# Patient Record
Sex: Female | Born: 1935
Health system: Southern US, Community
[De-identification: ages and names within clinical notes are randomized; demographics above are authoritative.]

## PROBLEM LIST (undated history)

## (undated) DIAGNOSIS — K5790 Diverticulosis of intestine, part unspecified, without perforation or abscess without bleeding: Secondary | ICD-10-CM

## (undated) DIAGNOSIS — E785 Hyperlipidemia, unspecified: Secondary | ICD-10-CM

## (undated) DIAGNOSIS — I498 Other specified cardiac arrhythmias: Secondary | ICD-10-CM

## (undated) DIAGNOSIS — I10 Essential (primary) hypertension: Secondary | ICD-10-CM

## (undated) DIAGNOSIS — M109 Gout, unspecified: Secondary | ICD-10-CM

## (undated) DIAGNOSIS — M25579 Pain in unspecified ankle and joints of unspecified foot: Secondary | ICD-10-CM

## (undated) DIAGNOSIS — K219 Gastro-esophageal reflux disease without esophagitis: Secondary | ICD-10-CM

## (undated) DIAGNOSIS — E039 Hypothyroidism, unspecified: Secondary | ICD-10-CM

## (undated) DIAGNOSIS — K5792 Diverticulitis of intestine, part unspecified, without perforation or abscess without bleeding: Secondary | ICD-10-CM

## (undated) DIAGNOSIS — W19XXXA Unspecified fall, initial encounter: Secondary | ICD-10-CM

## (undated) DIAGNOSIS — M2042 Other hammer toe(s) (acquired), left foot: Secondary | ICD-10-CM

## (undated) DIAGNOSIS — I161 Hypertensive emergency: Secondary | ICD-10-CM

## (undated) DIAGNOSIS — K52831 Collagenous colitis: Secondary | ICD-10-CM

## (undated) DIAGNOSIS — Z9889 Other specified postprocedural states: Secondary | ICD-10-CM

## (undated) DIAGNOSIS — R9431 Abnormal electrocardiogram [ECG] [EKG]: Secondary | ICD-10-CM

## (undated) DIAGNOSIS — E079 Disorder of thyroid, unspecified: Secondary | ICD-10-CM

## (undated) DIAGNOSIS — I499 Cardiac arrhythmia, unspecified: Secondary | ICD-10-CM

## (undated) HISTORY — PX: ABDOMINAL HYSTERECTOMY: SHX81

## (undated) HISTORY — PX: TONSILLECTOMY: SUR1361

## (undated) HISTORY — DX: Essential (primary) hypertension: I10

## (undated) HISTORY — DX: Hyperlipidemia, unspecified: E78.5

## (undated) HISTORY — DX: Gastro-esophageal reflux disease without esophagitis: K21.9

## (undated) HISTORY — DX: Collagenous colitis: K52.831

## (undated) HISTORY — DX: Cardiac arrhythmia, unspecified: I49.9

## (undated) HISTORY — PX: FEMUR SURGERY: SHX943

## (undated) HISTORY — DX: Diverticulitis of intestine, part unspecified, without perforation or abscess without bleeding: K57.92

## (undated) HISTORY — DX: Other specified cardiac arrhythmias: I49.8

## (undated) HISTORY — PX: JOINT REPLACEMENT: SHX530

## (undated) HISTORY — DX: Hypothyroidism, unspecified: E03.9

## (undated) HISTORY — DX: Abnormal electrocardiogram (ECG) (EKG): R94.31

## (undated) HISTORY — PX: CHOLECYSTECTOMY: SHX55

## (undated) HISTORY — DX: Disorder of thyroid, unspecified: E07.9

## (undated) HISTORY — DX: Unspecified fall, initial encounter: W19.XXXA

## (undated) HISTORY — DX: Other hammer toe(s) (acquired), left foot: M20.42

## (undated) HISTORY — PX: ADENOIDECTOMY: SUR15

## (undated) HISTORY — DX: Gout, unspecified: M10.9

## (undated) HISTORY — DX: Pain in unspecified ankle and joints of unspecified foot: M25.579

## (undated) HISTORY — DX: Diverticulosis of intestine, part unspecified, without perforation or abscess without bleeding: K57.90

## (undated) HISTORY — DX: Other specified postprocedural states: Z98.890

---

## 1997-07-15 ENCOUNTER — Inpatient Hospital Stay (HOSPITAL_COMMUNITY): Admission: EM | Admit: 1997-07-15 | Discharge: 1997-07-17 | Payer: Self-pay

## 1997-08-02 ENCOUNTER — Ambulatory Visit (HOSPITAL_COMMUNITY): Admission: RE | Admit: 1997-08-02 | Discharge: 1997-08-02 | Payer: Self-pay

## 1998-12-19 ENCOUNTER — Encounter: Admission: RE | Admit: 1998-12-19 | Discharge: 1998-12-19 | Payer: Self-pay | Admitting: Obstetrics and Gynecology

## 1998-12-19 ENCOUNTER — Encounter: Payer: Self-pay | Admitting: Obstetrics and Gynecology

## 1999-07-08 ENCOUNTER — Ambulatory Visit (HOSPITAL_BASED_OUTPATIENT_CLINIC_OR_DEPARTMENT_OTHER): Admission: RE | Admit: 1999-07-08 | Discharge: 1999-07-08 | Payer: Self-pay | Admitting: Otolaryngology

## 1999-11-18 ENCOUNTER — Encounter: Payer: Self-pay | Admitting: Otolaryngology

## 1999-11-20 ENCOUNTER — Encounter (INDEPENDENT_AMBULATORY_CARE_PROVIDER_SITE_OTHER): Payer: Self-pay | Admitting: Specialist

## 1999-11-20 ENCOUNTER — Ambulatory Visit (HOSPITAL_COMMUNITY): Admission: RE | Admit: 1999-11-20 | Discharge: 1999-11-21 | Payer: Self-pay | Admitting: Otolaryngology

## 1999-12-22 ENCOUNTER — Encounter: Payer: Self-pay | Admitting: Obstetrics and Gynecology

## 1999-12-22 ENCOUNTER — Encounter: Admission: RE | Admit: 1999-12-22 | Discharge: 1999-12-22 | Payer: Self-pay | Admitting: Obstetrics and Gynecology

## 2000-01-31 ENCOUNTER — Ambulatory Visit (HOSPITAL_BASED_OUTPATIENT_CLINIC_OR_DEPARTMENT_OTHER): Admission: RE | Admit: 2000-01-31 | Discharge: 2000-01-31 | Payer: Self-pay | Admitting: Otolaryngology

## 2000-12-23 ENCOUNTER — Encounter: Payer: Self-pay | Admitting: Obstetrics and Gynecology

## 2000-12-23 ENCOUNTER — Encounter: Admission: RE | Admit: 2000-12-23 | Discharge: 2000-12-23 | Payer: Self-pay

## 2002-01-03 ENCOUNTER — Encounter: Payer: Self-pay | Admitting: Obstetrics and Gynecology

## 2002-01-03 ENCOUNTER — Encounter: Admission: RE | Admit: 2002-01-03 | Discharge: 2002-01-03 | Payer: Self-pay | Admitting: Obstetrics and Gynecology

## 2002-12-15 ENCOUNTER — Encounter: Admission: RE | Admit: 2002-12-15 | Discharge: 2002-12-15 | Payer: Self-pay | Admitting: Obstetrics and Gynecology

## 2004-01-02 ENCOUNTER — Encounter: Admission: RE | Admit: 2004-01-02 | Discharge: 2004-01-02 | Payer: Self-pay | Admitting: Obstetrics and Gynecology

## 2005-01-14 ENCOUNTER — Encounter: Admission: RE | Admit: 2005-01-14 | Discharge: 2005-01-14 | Payer: Self-pay | Admitting: Obstetrics and Gynecology

## 2006-01-22 ENCOUNTER — Encounter: Admission: RE | Admit: 2006-01-22 | Discharge: 2006-01-22 | Payer: Self-pay | Admitting: Obstetrics and Gynecology

## 2006-06-01 ENCOUNTER — Encounter: Admission: RE | Admit: 2006-06-01 | Discharge: 2006-06-01 | Payer: Self-pay | Admitting: Family Medicine

## 2007-01-25 ENCOUNTER — Encounter: Admission: RE | Admit: 2007-01-25 | Discharge: 2007-01-25 | Payer: Self-pay | Admitting: Obstetrics and Gynecology

## 2008-03-05 ENCOUNTER — Ambulatory Visit: Payer: Self-pay | Admitting: Gastroenterology

## 2008-03-19 ENCOUNTER — Ambulatory Visit: Payer: Self-pay | Admitting: Gastroenterology

## 2008-05-29 ENCOUNTER — Encounter: Admission: RE | Admit: 2008-05-29 | Discharge: 2008-05-29 | Payer: Self-pay | Admitting: Family Medicine

## 2010-05-19 ENCOUNTER — Other Ambulatory Visit: Payer: Self-pay | Admitting: Family Medicine

## 2010-05-19 DIAGNOSIS — M25562 Pain in left knee: Secondary | ICD-10-CM

## 2010-05-23 NOTE — Discharge Summary (Signed)
[place]  Patient:    Sharon Bailey, Sharon Bailey                       MRN: [id number] Adm. Date:  [id number] Disc. Date: [id number] Attending:  Barbee Cough                           Discharge Summary  PREOPERATIVE DIAGNOSES: 1. Obstructive sleep apnea. 2. Nasal septal deviation with obstruction. 3. Uvulopalatal and tonsillar hypertrophy.  POSTOPERATIVE DIAGNOSES: 1. Obstructive sleep apnea. 2. Nasal septal deviation with obstruction. 3. Uvulopalatal and tonsillar hypertrophy.  SURGICAL PROCEDURES: 1. Uvulopalatopharyngoplasty. 2. Nasal septoplasty. 3. Tonsillectomy. 4. Inferior turbinate intramural cautery performed on November 20, 1999.  CONDITION ON DISCHARGE:  The patient was discharged to home in stable condition in the company of her husband.  DISCHARGE INSTRUCTIONS:  Limited activity, no lifting or straining and no nose blowing. Diet is clear liquids and soft diet as tolerated.  WOUND CARE:  Saline nasal spray four sprays per nostril ten times per day.  DISCHARGE MEDICATIONS: 1. Lortab elixir two to three teaspoons q.4-6h. as needed for pain, dispense    300 cc without refills. 2. Augmentin 10 elixir 400 mg/5 cc two teaspoons b.i.d. for ten days. 3. Vioxx 50 mg p.o. q.d.  BRIEF HISTORY:  Ms. Conchas is a 77 year old white female who is referred for surgical evaluation and management of obstructive sleep apnea. The patient had undergone previous inpatient sleep study, which showed moderate sleep apnea with oxygen desaturation. Given her history and inability to tolerate the use of nasal CPAP she was scheduled for the above surgical procedures prior to admission to the hospital. The risks, benefits and possible complications of each of the procedures was discussed in detail. The patient understood and concurred with our plan for surgery.  HOSPITAL COURSE:  The patient was admitted on November 20, 1999 to the ENT service at [facility]. She was taken to the main operating room and underwent uvulopalatopharyngoplasty, tonsillectomy, nasal septoplasty, and inferior turbinate reduction in order to improve the patients airway patency. The patients surgical procedure was uneventful and she was transferred from the operating room to recovery, and from recovery to unit 2100 in stable condition. Postoperative stay on unit 2100 was uneventful. The patients room air saturation was greater than 91% throughout the night, which was a significant improvement from her preoperative O2 nadir. She was tolerating a soft oral diet and liquids, and pain control was managed with Lortab elixir. The patient was ambulatory and had normal bowel and bladder function. Examination on the first postoperative morning showed the nasal splints to be intact with minimal bloody mucoid discharge. The oral cavity and oropharynx showed uvulopalatal sutures to be intact without evidence of bleeding or infection. The patient was discharged home in stable condition with the above instructions, in the company of her husband. She understands and agrees with our discharge planning, as outlined above. DD:  11/21/99 TD:  11/21/99 Job: [id number]

## 2010-05-23 NOTE — Op Note (Signed)
[place]  Patient:    Sharon Bailey, Sharon Bailey                       MRN: [id number] Proc. Date: 11/20/99 Adm. Date:  [id number] Attending:  Barbee Cough                           Operative Report  PREOPERATIVE DIAGNOSES: 1. Moderately-severe obstructive sleep apnea. 2. Nasal obstruction. 3. Uvulopalatal hypertrophy. 4. Tonsillar hypertrophy.  POSTOPERATIVE DIAGNOSES: 1. Moderately-severe obstructive sleep apnea. 2. Nasal obstruction. 3. Uvulopalatal hypertrophy. 4. Tonsillar hypertrophy.  SURGICAL PROCEDURE: 1. Uvulopalatal pharyngoplasty. 2. Nasal septoplasty. 3. Tonsillectomy. 4. Inferior turbinate intramural cautery.  SURGEON:  Kinnie Scales. Annalee Genta, M.D.  ANESTHESIA:  General endotracheal.  COMPLICATIONS:  None.  ESTIMATED BLOOD LOSS:  Less than 50 cc.  The patient was transferred from the operating room to the recovery room in stable condition.  INDICATIONS:  Ms. Beauchesne is a 77 year old white female referred for an evaluation and surgical management of obstructive sleep apnea.  The patient was found to have moderately-severe sleep apnea with O2 desaturations into the mid-80s.  She was tried on nasal CPAP, but secondary to chronic nasal obstruction, was unable to tolerate the CPAP on an ongoing basis.  Given these findings and her moderate sleep apnea, I recommended that we consider surgical intervention, based on her examination.  Surgery includes nasoseptoplasty, inferior turbinate reduction, uvulopalatal pharyngoplasty, and tonsillectomy. The risks, benefits, and possible complications of each of the surgical procedures were discussed in detail with the patient and with her husband prior to surgery.  They understood and concurred with our plan for surgery. She was cleared for the surgical procedure by her medical physician, Dr. Melvyn Neth D. Clovis Riley.  DESCRIPTION OF PROCEDURE:  The patient was brought to the operating room  on November 20, 1999, and placed in the supine position on the operating room table.  General endotracheal anesthesia was established without difficulty. When the patient was adequately anesthetized, she was injected with 12 cc of 1% lidocaine and 1:100,000 solution epinephrine injected in a submucosal fashion on the nasal septum, and inferior turbinates.  The patients nose was then packed with Afrin-soaked cottonoid pledgets which were left in place for approximately 10 minutes.  She was prepped and draped in a sterile fashion. The procedure was begun with a nasoseptoplasty.  The cottonoids were removed from the nasal cavity, and a right hemitransfixion incision was created in the mucosa.  This was carried from anterior to posterior in a subperichondrial fashion along the right aspect of the nasal septum.  The patient was found to have severe inferior septal spurring and obstruction.  Abutting cartilaginous _____ was crossed in the midline.  The mucoperiosteal flap was elevated on the patients left-hand side.  Deviated bone and cartilage, including the inferior septal spur were resected from the mid and posterior aspects of the nasal septum.  The anterior cartilaginous septum was left intact, as this was not significantly deviated.  The mucoperichondrial flaps were then reapproximated with a #4-0 gut suture on a Keith needle in a horizontal mattressing fashion.  The hemitransfixion incision was closed with a stitch. Bilateral Doyle nasal septal splints were then placed, after the application of Bactroban ointment.  They were sutured into position with a #3-0 Ethilon suture.  The inferior turbinates were then cauterized with the cautery set at 12 watts.  Two passes were made in each inferior  turbinate.  When the turbinates had been adequately cauterized in a submucosal fashion, they were outfractured to create a more patent nasal cavity.  Attention was then turned to the patients oral  cavity and oropharynx.  A Crowe-Davis mouth gag was inserted without difficulty.  There were no loose or broken teeth.  With the Crowe-Davis mouth gag in place, a tonsillectomy was performed beginning on the left-hand side using the Harmonic scalpel, dissecting in the subcapsular fashion, removing the entire left tonsil, from superior pole to tongue base.  The right tonsil was removed in a similar fashion.  A small amount of bleeding was encountered, and this was cauterized using suction cautery.  The tonsillar fossa was then gently abraded with a dry tonsillar sponge.  The mouth gag was released and reapplied, and there was no active bleeding.  Attention was then turned to the uvulopalatal pharyngoplasty.  The patient had excessive palatal tissue, including uvula and posterior tonsillar pillars. The posterior tonsillar pillars were left intact.  Approximately 1.0 cm of palatal tissue along the palatal margin was resected from the superior aspect of the anterior tonsillar pillars, dissecting inferior posterior, leaving a posterior mucosal flap.  The entire uvula and palatal margin was resected, including palatal musculature and anterior mucosa.  With resection of excessive tissue, suspensory sutures were then placed.  These consisted of #3-0 Vicryl suture on a Keith needle in a horizontal mattressing fashion, to advance the posterior tonsillar pillars forward.  The mucosal margin of the palate was then closed with interrupted #3-0 Vicryl sutures.  The patients nasal cavity, nasopharynx, oral cavity, and oropharynx were thoroughly irrigated and suctioned, and an oral gastric tube was passed into the stomach, and the contents were aspirated.  The Crowe-Davis mouth gag was released and removed.  Again no loose or broken teeth were noted.  The patient was then awakened from anesthetic, extubated, and transferred from the operating room to the recovery room in stable condition.   No complications.  The estimated blood loss was less than 50 cc.DD:  11/20/99 TD:  11/20/99 Job: [id number]

## 2010-05-26 ENCOUNTER — Ambulatory Visit
Admission: RE | Admit: 2010-05-26 | Discharge: 2010-05-26 | Disposition: A | Discharge status: Home / Self Care | Payer: Medicare Other | Source: Ambulatory Visit | Attending: Family Medicine | Admitting: Family Medicine

## 2010-05-26 DIAGNOSIS — M25562 Pain in left knee: Secondary | ICD-10-CM

## 2010-06-27 ENCOUNTER — Other Ambulatory Visit (HOSPITAL_COMMUNITY): Payer: Self-pay | Admitting: Orthopedic Surgery

## 2010-06-27 ENCOUNTER — Ambulatory Visit (HOSPITAL_COMMUNITY)
Admission: RE | Admit: 2010-06-27 | Discharge: 2010-06-27 | Disposition: A | Discharge status: Home / Self Care | Payer: Medicare Other | Source: Ambulatory Visit | Attending: Orthopedic Surgery | Admitting: Orthopedic Surgery

## 2010-06-27 ENCOUNTER — Encounter (HOSPITAL_COMMUNITY)
Admission: RE | Admit: 2010-06-27 | Discharge: 2010-06-27 | Disposition: A | Discharge status: Home / Self Care | Payer: Medicare Other | Source: Ambulatory Visit | Attending: Orthopedic Surgery | Admitting: Orthopedic Surgery

## 2010-06-27 DIAGNOSIS — M25569 Pain in unspecified knee: Secondary | ICD-10-CM

## 2010-06-27 DIAGNOSIS — Z01818 Encounter for other preprocedural examination: Secondary | ICD-10-CM | POA: Insufficient documentation

## 2010-06-27 DIAGNOSIS — Z01812 Encounter for preprocedural laboratory examination: Secondary | ICD-10-CM | POA: Insufficient documentation

## 2010-06-27 DIAGNOSIS — Z01810 Encounter for preprocedural cardiovascular examination: Secondary | ICD-10-CM | POA: Insufficient documentation

## 2010-06-27 LAB — CBC
HCT: 39.1 % (ref 36.0–46.0)
Hemoglobin: 13.3 g/dL (ref 12.0–15.0)
MCV: 79 fL (ref 78.0–100.0)
RBC: 4.95 MIL/uL (ref 3.87–5.11)
RDW: 14.4 % (ref 11.5–15.5)

## 2010-06-27 LAB — DIFFERENTIAL
Basophils Absolute: 0.1 10*3/uL (ref 0.0–0.1)
Basophils Relative: 1 % (ref 0–1)
Eosinophils Absolute: 0.7 10*3/uL (ref 0.0–0.7)
Eosinophils Relative: 6 % — ABNORMAL HIGH (ref 0–5)
Neutrophils Relative %: 60 % (ref 43–77)

## 2010-06-27 LAB — BASIC METABOLIC PANEL
Calcium: 10.2 mg/dL (ref 8.4–10.5)
GFR calc Af Amer: 56 mL/min — ABNORMAL LOW (ref >60)
Glucose, Bld: 88 mg/dL (ref 70–99)
Sodium: 138 mEq/L (ref 135–145)

## 2010-06-27 LAB — URINALYSIS, ROUTINE W REFLEX MICROSCOPIC
Bilirubin Urine: NEGATIVE
Glucose, UA: NEGATIVE mg/dL
Hgb urine dipstick: NEGATIVE
Ketones, ur: NEGATIVE mg/dL
Leukocytes, UA: NEGATIVE
Nitrite: NEGATIVE
Protein, ur: NEGATIVE mg/dL
Specific Gravity, Urine: 1.012 (ref 1.005–1.030)
Urobilinogen, UA: 0.2 mg/dL (ref 0.0–1.0)
pH: 7 (ref 5.0–8.0)

## 2010-06-27 LAB — SURGICAL PCR SCREEN: Staphylococcus aureus: NEGATIVE

## 2010-06-27 LAB — ABO/RH: ABO/RH(D): O POS

## 2010-06-28 LAB — URINE CULTURE: Colony Count: 6000

## 2010-07-01 ENCOUNTER — Inpatient Hospital Stay (HOSPITAL_COMMUNITY)
Admission: RE | Admit: 2010-07-01 | Discharge: 2010-07-05 | DRG: 470 | Disposition: A | Discharge status: Home Health | Payer: Medicare Other | Source: Ambulatory Visit | Attending: Orthopedic Surgery | Admitting: Orthopedic Surgery

## 2010-07-01 ENCOUNTER — Inpatient Hospital Stay (HOSPITAL_COMMUNITY): Payer: Medicare Other

## 2010-07-01 DIAGNOSIS — M1710 Unilateral primary osteoarthritis, unspecified knee: Principal | ICD-10-CM | POA: Diagnosis present

## 2010-07-02 LAB — BASIC METABOLIC PANEL
BUN: 18 mg/dL (ref 6–23)
CO2: 29 mEq/L (ref 19–32)
Chloride: 97 mEq/L (ref 96–112)
Potassium: 3.7 mEq/L (ref 3.5–5.1)

## 2010-07-02 LAB — CBC
MCH: 26.5 pg (ref 26.0–34.0)
MCHC: 33 g/dL (ref 30.0–36.0)
Platelets: 180 10*3/uL (ref 150–400)
RBC: 3.44 MIL/uL — ABNORMAL LOW (ref 3.87–5.11)
RDW: 14.3 % (ref 11.5–15.5)
WBC: 11.9 10*3/uL — ABNORMAL HIGH (ref 4.0–10.5)

## 2010-07-02 LAB — PROTIME-INR
INR: 1.29 (ref 0.00–1.49)
Prothrombin Time: 16.3 seconds — ABNORMAL HIGH (ref 11.6–15.2)

## 2010-07-03 LAB — BASIC METABOLIC PANEL
BUN: 18 mg/dL (ref 6–23)
CO2: 26 mEq/L (ref 19–32)
GFR calc Af Amer: >60 mL/min (ref >60)
GFR calc non Af Amer: 55 mL/min — ABNORMAL LOW (ref >60)
Glucose, Bld: 125 mg/dL — ABNORMAL HIGH (ref 70–99)

## 2010-07-03 LAB — CBC
Hemoglobin: 8.4 g/dL — ABNORMAL LOW (ref 12.0–15.0)
MCH: 26.3 pg (ref 26.0–34.0)
MCHC: 33.5 g/dL (ref 30.0–36.0)
MCV: 78.4 fL (ref 78.0–100.0)
Platelets: 167 10*3/uL (ref 150–400)
RDW: 14.1 % (ref 11.5–15.5)
WBC: 13.1 10*3/uL — ABNORMAL HIGH (ref 4.0–10.5)

## 2010-07-03 LAB — PROTIME-INR: INR: 2.73 — ABNORMAL HIGH (ref 0.00–1.49)

## 2010-07-04 LAB — CBC
Hemoglobin: 8 g/dL — ABNORMAL LOW (ref 12.0–15.0)
MCH: 27.2 pg (ref 26.0–34.0)
MCHC: 34.8 g/dL (ref 30.0–36.0)
Platelets: 177 10*3/uL (ref 150–400)
RDW: 14 % (ref 11.5–15.5)

## 2010-07-04 LAB — PROTIME-INR
INR: 2.69 — ABNORMAL HIGH (ref 0.00–1.49)
Prothrombin Time: 29 seconds — ABNORMAL HIGH (ref 11.6–15.2)

## 2010-07-18 NOTE — Op Note (Signed)
NAMEMarland Kitchen  JAKARA, BLATTER NO.:  [id number]  MEDICAL RECORD NO.:  [id number]  LOCATION:  5016                         FACILITY:  [facility]  PHYSICIAN:  Burnard Bunting, M.D.    DATE OF BIRTH:  Jun 13, 1930  DATE OF PROCEDURE: DATE OF DISCHARGE:                              OPERATIVE REPORT   PREOPERATIVE DIAGNOSIS:  Left knee arthritis.  POSTOPERATIVE DIAGNOSIS:  Left knee arthritis.  PROCEDURE:  Left total knee replacement using DePuy posterior stabilized cemented rotating platform 2.5 femur, 2.5 tibia, 38 patella, 15 poly.  SURGEON:  Burnard Bunting, MD  ASSISTANT:  Wende Neighbors, PA  ANESTHESIA:  General endotracheal.  ESTIMATED BLOOD LOSS:  150.Marland Kitchen  DRAINS:  Hemovac x1.  INDICATIONS:  Sharon Bailey is a 77 year old patient with left knee arthritis presents for operative management  after explanation of risks and benefits.  PROCEDURE IN DETAIL:  The patient was brought to the operating room where general endotracheal anesthesia was induced.  Preoperative antibiotic was administered.  Left leg was prescrubbed with alcohol and Betadine which allowed to air dry, then prepped with DuraPrep solution and draped in sterile manner.  Time-out was called.  Left leg was elevated and exsanguinated with an Esmarch wrap.  Tourniquet was inflated to 300 mmHg.  Total tourniquet time 111 minutes.  Anterior approach to the knee was utilized.  Skin and subcutaneous tissues were sharply divided.  The median parapatellar arthrotomy was made.  Precise location was marked with #1 Vicryl suture.  Fat pad was partially excised.  Lateral patellofemoral ligament was released.  Tissue was removed from the anterior distal portion of the femur.  The two pins were placed in the distal medial femur, proximal medial tibia. Registration points for computer guidance were obtained including bimalleolar axis, hip center, rotation, various points around the knee. Tibia cut was then performed in  perpendicular mechanical axis, 10 off the medial side, 8.6 off the more affected lateral side.  Tensioning device was placed with both extension and flexion.  The distal femur was then cut down to come out full extension with a 10-degree poly spacer. Chamfer box cuts were made.  The osteophytes were removed posteriorly. Minimal osteophytes were present however.  Tibia was keel punch.  Trial components were placed.  Patella was cut freehand from 21 down to 11 mm, 38 button was placed with a trial components in position with 12.5 poly. The patient achieved full extension, full flexion, excellent stability of varus-valgus stress at 0, 30 and 90 degrees of excellent patellar tracking.  Trial components were removed.  True components were placed after cement hardening occurred.  The patient had about 4 to 5 degrees hyperextension with a 15 poly in place that changed to 0 degrees of extension and neutral alignment.  This poly was chosen.  Tourniquet was released.  Following placement of that poly, same stability parameters were maintained.  The bleeding points encountered were controlled with electrocautery.  Incision was then closed using interrupted inverted #1 Vicryl suture, 0 Vicryl suture, 2-0 Vicryl suture and skin staples. Incisions for the pin sites were closed using 3-0 nylon.  Solution of Marcaine and morphine finally injected to the knee  for postop pain relief.  Bulky dressing and knee immobilizer was placed.  Velna Hatchet Vernon's assistance was required all times during the case for retraction of important neurovascular structures, limb positioning, cement removal, opening, closing.  Her assistance was a medical necessity.     Burnard Bunting, M.D.     GSD/MEDQ  D:  07/01/2010  T:  07/02/2010  Job:  [id number]  Electronically Signed by Reece Agar.  Tamica Covell M.D. on 07/18/2010 05:36:58 PM

## 2010-08-07 NOTE — Discharge Summary (Signed)
  NAMEMALLOREE, RABOIN NO.:  [id number]  MEDICAL RECORD NO.:  [id number]  LOCATION:  5016                         FACILITY:  [facility]  PHYSICIAN:  Burnard Bunting, M.D.    DATE OF BIRTH:  [birth year]  DATE OF ADMISSION:  07/01/2010 DATE OF DISCHARGE:  07/05/2010                              DISCHARGE SUMMARY   DISCHARGE DIAGNOSIS:  Left knee arthritis.  SECONDARY DIAGNOSIS:  None.  OPERATIONS AND PROCEDURES:  Left total knee replacement performed on July 02, 2010.  HOSPITAL COURSE:  Sharon Bailey is a 77 year old female who underwent left total knee replacement on July 02, 2010.  She tolerated the procedure well without immediate complications, transferred to recovery room in stable condition.  CPM was started night of surgery.  Therapy was started on postop day #1, DVT prophylaxis with mechanical means and Coumadin.  The patient tolerated her hospital stay well.  She is discharged in good condition July 05, 2010, ambulating, weightbearing as tolerated with the knee immobilizer.  She will follow up with me in 7 days.  DISCHARGE MEDICATIONS: 1. Zantac 75 mg daily. 2. Calcium 1 p.o. daily. 3. Aspirin 81 mg p.o. daily. 4. Simvastatin 40 mg p.o. daily. 5. Synthroid 100 mcg p.o. daily. 6. Premarin daily. 7. Hydrochlorothiazide 25 mg p.o. daily. 8. Robaxin 500 mg p.o. q.8 h. p.r.n. spasm. 9. Percocet 10/325 one p.o. q.3-4 hours p.r.n. pain. 10.Coumadin 5 mg p.o. daily to INR of 2 to 2.5.  She will follow up with me in 7 days for suture removal.     Burnard Bunting, M.D.     GSD/MEDQ  D:  07/18/2010  T:  07/19/2010  Job:  [id number]  Electronically Signed by Reece Agar.  Karis Rilling M.D. on 08/07/2010 08:11:02 AM

## 2011-07-07 ENCOUNTER — Other Ambulatory Visit: Payer: Self-pay | Admitting: Gynecology

## 2011-07-07 DIAGNOSIS — R928 Other abnormal and inconclusive findings on diagnostic imaging of breast: Secondary | ICD-10-CM

## 2011-07-14 ENCOUNTER — Ambulatory Visit
Admission: RE | Admit: 2011-07-14 | Discharge: 2011-07-14 | Disposition: A | Discharge status: Home / Self Care | Payer: Medicare Other | Source: Ambulatory Visit | Attending: Gynecology | Admitting: Gynecology

## 2011-07-14 DIAGNOSIS — R928 Other abnormal and inconclusive findings on diagnostic imaging of breast: Secondary | ICD-10-CM

## 2012-03-09 ENCOUNTER — Other Ambulatory Visit: Payer: Self-pay | Admitting: Family Medicine

## 2012-03-18 ENCOUNTER — Ambulatory Visit
Admission: RE | Admit: 2012-03-18 | Discharge: 2012-03-18 | Disposition: A | Discharge status: Home / Self Care | Payer: Medicare Other | Source: Ambulatory Visit | Attending: Family Medicine | Admitting: Family Medicine

## 2012-03-18 DIAGNOSIS — M25551 Pain in right hip: Secondary | ICD-10-CM

## 2012-06-17 ENCOUNTER — Emergency Department (HOSPITAL_COMMUNITY)
Admission: EM | Admit: 2012-06-17 | Discharge: 2012-06-17 | Disposition: A | Discharge status: Home / Self Care | Payer: Medicare Other | Attending: Emergency Medicine | Admitting: Emergency Medicine

## 2012-06-17 ENCOUNTER — Encounter (HOSPITAL_COMMUNITY): Payer: Self-pay | Admitting: Emergency Medicine

## 2012-06-17 DIAGNOSIS — F411 Generalized anxiety disorder: Secondary | ICD-10-CM | POA: Insufficient documentation

## 2012-06-17 DIAGNOSIS — R040 Epistaxis: Secondary | ICD-10-CM | POA: Insufficient documentation

## 2012-06-17 MED ORDER — OXYMETAZOLINE HCL 0.05 % NA SOLN
1.0000 | Freq: Once | NASAL | Status: AC
  Administered 2012-06-17: 1 via NASAL
  Filled 2012-06-17: qty 15

## 2012-06-17 NOTE — ED Provider Notes (Signed)
History     CSN: [id number]  Arrival date & time 06/17/12  1723   First MD Initiated Contact with Patient 06/17/12 1747      Chief Complaint  Patient presents with  . Epistaxis  . Anxiety    (Consider location/radiation/quality/duration/timing/severity/associated sxs/prior treatment) HPI Comments: The patient is a 79 year old female who presents approximately 20 minutes after having acute onset of right nose bleed. This was anterior epistaxis that occurred after she was picking her nose and scraped the surface of the septum. The bleeding was profuse, controlled with pressure and resolve spontaneously on arrival. She has no other complaints including no headache, no blurred vision, no shortness of breath or weakness. She takes no blood thinners other than a baby aspirin at night.  Patient is a 79 y.o. female presenting with nosebleeds and anxiety. The history is provided by the patient and a relative.  Epistaxis Anxiety    History reviewed. No pertinent past medical history.  History reviewed. No pertinent past surgical history.  History reviewed. No pertinent family history.  History  Substance Use Topics  . Smoking status: Never Smoker   . Smokeless tobacco: Not on file  . Alcohol Use: No    OB History   Grav Para Term Preterm Abortions TAB SAB Ect Mult Living                  Review of Systems  HENT: Positive for nosebleeds.   All other systems reviewed and are negative.    Allergies  Review of patient's allergies indicates no known allergies.  Home Medications  No current outpatient prescriptions on file.  BP 222/76  Pulse 117  Resp 24  SpO2 97%  Physical Exam  Nursing note and vitals reviewed. Constitutional: She appears well-developed and well-nourished. No distress.  HENT:  Head: Normocephalic and atraumatic.  Mouth/Throat: Oropharynx is clear and moist. No oropharyngeal exudate.  Oropharynx is clear and moist, no signs of posterior epistaxis in  the posterior pharynx. Left nostril clear and patent, right nostril with slight abrasion to the septum on the right, bleeding is controlled, no blood clots noted in the nares  Eyes: Conjunctivae and EOM are normal. Pupils are equal, round, and reactive to light. Right eye exhibits no discharge. Left eye exhibits no discharge. No scleral icterus.  Neck: Normal range of motion. Neck supple. No JVD present. No thyromegaly present.  Cardiovascular: Normal rate, regular rhythm, normal heart sounds and intact distal pulses.  Exam reveals no gallop and no friction rub.   No murmur heard. Pulmonary/Chest: Effort normal and breath sounds normal. No respiratory distress. She has no wheezes. She has no rales.  Abdominal: Soft. Bowel sounds are normal. She exhibits no distension and no mass. There is no tenderness.  Musculoskeletal: Normal range of motion. She exhibits no edema and no tenderness.  Lymphadenopathy:    She has no cervical adenopathy.  Neurological: She is alert. Coordination normal.  Skin: Skin is warm and dry. No rash noted. No erythema.  Psychiatric: She has a normal mood and affect. Her behavior is normal.    ED Course  EPISTAXIS MANAGEMENT Date/Time: 06/17/2012 7:38 PM Performed by: Eber Hong D Authorized by: Eber Hong D Consent: Verbal consent obtained. Risks and benefits: risks, benefits and alternatives were discussed Consent given by: patient Patient understanding: patient states understanding of the procedure being performed Patient identity confirmed: verbally with patient Time out: Immediately prior to procedure a "time out" was called to verify the correct patient, procedure, equipment,  support staff and site/side marked as required. Preparation: Patient was prepped and draped in the usual sterile fashion. Local anesthetic: topical anesthetic Patient sedated: no Treatment site: right anterior Repair method: silver nitrate Post-procedure assessment: bleeding  stopped Treatment complexity: simple Patient tolerance: Patient tolerated the procedure well with no immediate complications.   (including critical care time)  Labs Reviewed - No data to display No results found.   1. Right-sided epistaxis       MDM  The patient is well-appearing, initially she was very hypertensive, will need a recheck blood pressure, evacuate the nose and treat the septum with possible cautery as needed. Otherwise the patient appears stable, no indication for checking hemoglobin at this time with a small amount of anterior epistaxis.  Patient tolerated the epistaxis management with silver nitrate appropriately, recheck vital signs prior to discharge, patient appears stable for discharge.      Vida Roller, MD 06/17/12 [phone]

## 2012-06-17 NOTE — ED Notes (Addendum)
Pt states she began to have a nosebleed 20 minutes ago.  Bleeding slow at present time.  Pt denies being on blood thinners. Pt anxious. Pt states she was picking nose when nosebleed began.

## 2012-09-28 ENCOUNTER — Encounter: Payer: Self-pay | Admitting: Podiatry

## 2012-09-28 ENCOUNTER — Ambulatory Visit (INDEPENDENT_AMBULATORY_CARE_PROVIDER_SITE_OTHER): Payer: Medicare Other | Admitting: Podiatry

## 2012-09-28 VITALS — BP 153/82 | HR 87 | Ht 66.0 in | Wt 170.0 lb

## 2012-09-28 DIAGNOSIS — M2042 Other hammer toe(s) (acquired), left foot: Secondary | ICD-10-CM

## 2012-09-28 DIAGNOSIS — M2040 Other hammer toe(s) (acquired), unspecified foot: Secondary | ICD-10-CM

## 2012-09-28 DIAGNOSIS — M25579 Pain in unspecified ankle and joints of unspecified foot: Secondary | ICD-10-CM

## 2012-09-28 DIAGNOSIS — M109 Gout, unspecified: Secondary | ICD-10-CM

## 2012-09-28 DIAGNOSIS — M25572 Pain in left ankle and joints of left foot: Secondary | ICD-10-CM

## 2012-09-28 HISTORY — DX: Pain in unspecified ankle and joints of unspecified foot: M25.579

## 2012-09-28 HISTORY — DX: Other hammer toe(s) (acquired), left foot: M20.42

## 2012-09-28 HISTORY — DX: Gout, unspecified: M10.9

## 2012-09-28 MED ORDER — HYDROCODONE-IBUPROFEN 7.5-200 MG PO TABS
1.0000 | ORAL_TABLET | Freq: Four times a day (QID) | ORAL | Status: DC | PRN
Start: 2012-09-28 — End: 2014-10-10

## 2012-09-28 NOTE — Progress Notes (Signed)
Subjective: 79 y.o. year old female patient presents with swollen enlarged red 2nd toe left foot x 1 week. She has had previous episode on another toe with the same gouty tophaceous joint 3rd left.   Review of Systems - General ROS: negative for - chills, fatigue, fever, malaise, night sweats or sleep disturbance Ophthalmic ROS: negative ENT ROS: negative Allergy and Immunology ROS: None. Hematological and Lymphatic ROS: negative Respiratory ROS: no cough, shortness of breath, or wheezing Cardiovascular ROS: no chest pain or dyspnea on exertion Gastrointestinal ROS: no abdominal pain, change in bowel habits, or black or bloody stools Musculoskeletal ROS: Multiple contracted lesser digits with enlarged 2nd toe left. Neurological ROS: no TIA or stroke symptoms Dermatological ROS: Red swollen toe 2nd left with white tophus imbedded.   Objective: Dermatologic: Red inflamed swollen 2nd toe left with white bumps from imbedded tophus. Vascular: Pedal pulses are all palpable. Orthopedic: Contracted 2nd digit left.  Neurologic: All epicritic and tactile sensations grossly intact. Radiographic examination show subluxed PIPJ, head of the proximal phalanx sitting on top of the base of the middle phalanx 2nd digit left with severe hammered contracture.  Assessment: Inflamed tophaceous toe 2nd left. Deformed Hammered toe with joint subluxation at PIPJ 2nd digit left.   Treatment: Reviewed clinical findings. Plan on I&D of Tophus and correction of deformity 2nd digit left. Patient filled out consent form and reviewed.

## 2012-09-28 NOTE — Patient Instructions (Addendum)
Seen for acute gouty arthritis and tophaceous 2nd digit left. It requires Incision and drainage, plus straighten out contracted digit. This will be done at out patient surgery center under IV sedation.  Following the surgery, it is allowed to ambulate as tolerated. Please follow the direction on pre operative preparation, such as nothing by mouth after midnight.

## 2012-09-29 DIAGNOSIS — M7130 Other bursal cyst, unspecified site: Secondary | ICD-10-CM

## 2012-09-29 DIAGNOSIS — M2040 Other hammer toe(s) (acquired), unspecified foot: Secondary | ICD-10-CM

## 2012-09-29 HISTORY — PX: OTHER SURGICAL HISTORY: SHX169

## 2012-10-04 ENCOUNTER — Encounter: Payer: Self-pay | Admitting: Podiatry

## 2012-10-04 ENCOUNTER — Ambulatory Visit (INDEPENDENT_AMBULATORY_CARE_PROVIDER_SITE_OTHER): Payer: Medicare Other | Admitting: Podiatry

## 2012-10-04 VITALS — BP 152/86 | HR 84

## 2012-10-04 DIAGNOSIS — M2042 Other hammer toe(s) (acquired), left foot: Secondary | ICD-10-CM

## 2012-10-04 DIAGNOSIS — M2040 Other hammer toe(s) (acquired), unspecified foot: Secondary | ICD-10-CM

## 2012-10-04 NOTE — Patient Instructions (Addendum)
Post op wound. Toe is healing well with decreased redness and swelling. Post op X-rays done. Return in one week.

## 2012-10-04 NOTE — Progress Notes (Signed)
First post op visit following hammer toe repair and incision and drainage of tophus substance from 2nd digit right. Coloration of the toe has less of redness and less swollen. Incision are is well coapting and dry. Post op X-ray is consistent with surgery performed.  Assessment: Satisfactory post op progress.  Area redressed with Iodine solution soaked gauze bandage.  Return in one week.

## 2012-10-11 ENCOUNTER — Encounter: Payer: Self-pay | Admitting: Podiatry

## 2012-10-11 ENCOUNTER — Ambulatory Visit (INDEPENDENT_AMBULATORY_CARE_PROVIDER_SITE_OTHER): Payer: Medicare Other | Admitting: Podiatry

## 2012-10-11 VITALS — BP 144/76 | HR 84 | Ht 66.0 in | Wt 170.0 lb

## 2012-10-11 DIAGNOSIS — Z9889 Other specified postprocedural states: Secondary | ICD-10-CM

## 2012-10-11 DIAGNOSIS — M2042 Other hammer toe(s) (acquired), left foot: Secondary | ICD-10-CM

## 2012-10-11 DIAGNOSIS — M2040 Other hammer toe(s) (acquired), unspecified foot: Secondary | ICD-10-CM

## 2012-10-11 HISTORY — DX: Other specified postprocedural states: Z98.890

## 2012-10-11 NOTE — Patient Instructions (Addendum)
Healed well. Sutures removed. Porous tape applied.  Continue in Surgical shoe and use the tape for another week.

## 2012-10-11 NOTE — Progress Notes (Signed)
Post op visit. Hammer toe 2 left. Suture removed. Healing well without complication. Selofix tape dispensed. Return as needed.

## 2012-10-27 ENCOUNTER — Encounter: Payer: Self-pay | Admitting: *Deleted

## 2014-10-07 ENCOUNTER — Encounter (HOSPITAL_COMMUNITY): Payer: Self-pay

## 2014-10-07 ENCOUNTER — Emergency Department (HOSPITAL_COMMUNITY)
Admission: EM | Admit: 2014-10-07 | Discharge: 2014-10-07 | Disposition: A | Discharge status: Home / Self Care | Payer: Medicare Other | Attending: Emergency Medicine | Admitting: Emergency Medicine

## 2014-10-07 DIAGNOSIS — R197 Diarrhea, unspecified: Secondary | ICD-10-CM | POA: Diagnosis present

## 2014-10-07 DIAGNOSIS — Z79899 Other long term (current) drug therapy: Secondary | ICD-10-CM | POA: Insufficient documentation

## 2014-10-07 DIAGNOSIS — Z7982 Long term (current) use of aspirin: Secondary | ICD-10-CM | POA: Insufficient documentation

## 2014-10-07 DIAGNOSIS — N3000 Acute cystitis without hematuria: Secondary | ICD-10-CM | POA: Diagnosis not present

## 2014-10-07 LAB — COMPREHENSIVE METABOLIC PANEL
ALT: 12 U/L — ABNORMAL LOW (ref 14–54)
AST: 23 U/L (ref 15–41)
Albumin: 3.8 g/dL (ref 3.5–5.0)
Alkaline Phosphatase: 53 U/L (ref 38–126)
Anion gap: 6 (ref 5–15)
BUN: 19 mg/dL (ref 6–20)
CO2: 23 mmol/L (ref 22–32)
Calcium: 9.1 mg/dL (ref 8.9–10.3)
Chloride: 110 mmol/L (ref 101–111)
Creatinine, Ser: 1.67 mg/dL — ABNORMAL HIGH (ref 0.44–1.00)
GFR calc Af Amer: 32 mL/min — ABNORMAL LOW (ref >60)
GFR calc non Af Amer: 27 mL/min — ABNORMAL LOW (ref >60)
Glucose, Bld: 117 mg/dL — ABNORMAL HIGH (ref 65–99)
Potassium: 3.9 mmol/L (ref 3.5–5.1)
Sodium: 139 mmol/L (ref 135–145)
Total Bilirubin: 0.6 mg/dL (ref 0.3–1.2)
Total Protein: 7.2 g/dL (ref 6.5–8.1)

## 2014-10-07 LAB — LIPASE, BLOOD: Lipase: 36 U/L (ref 22–51)

## 2014-10-07 LAB — BASIC METABOLIC PANEL
Anion gap: 6 (ref 5–15)
BUN: 18 mg/dL (ref 6–20)
CALCIUM: 8.3 mg/dL — AB (ref 8.9–10.3)
CHLORIDE: 113 mmol/L — AB (ref 101–111)
CO2: 21 mmol/L — ABNORMAL LOW (ref 22–32)
CREATININE: 1.44 mg/dL — AB (ref 0.44–1.00)
GFR calc Af Amer: 38 mL/min — ABNORMAL LOW (ref >60)
GFR calc non Af Amer: 33 mL/min — ABNORMAL LOW (ref >60)
Glucose, Bld: 106 mg/dL — ABNORMAL HIGH (ref 65–99)
Potassium: 3.9 mmol/L (ref 3.5–5.1)
SODIUM: 140 mmol/L (ref 135–145)

## 2014-10-07 LAB — URINALYSIS, ROUTINE W REFLEX MICROSCOPIC
Bilirubin Urine: NEGATIVE
Glucose, UA: NEGATIVE mg/dL
Hgb urine dipstick: NEGATIVE
Ketones, ur: NEGATIVE mg/dL
Nitrite: NEGATIVE
Protein, ur: NEGATIVE mg/dL
Specific Gravity, Urine: 1.004 — ABNORMAL LOW (ref 1.005–1.030)
Urobilinogen, UA: 0.2 mg/dL (ref 0.0–1.0)
pH: 5.5 (ref 5.0–8.0)

## 2014-10-07 LAB — CBC
HCT: 34.5 % — ABNORMAL LOW (ref 36.0–46.0)
Hemoglobin: 11.2 g/dL — ABNORMAL LOW (ref 12.0–15.0)
MCH: 27.7 pg (ref 26.0–34.0)
MCHC: 32.5 g/dL (ref 30.0–36.0)
MCV: 85.4 fL (ref 78.0–100.0)
Platelets: 200 10*3/uL (ref 150–400)
RBC: 4.04 MIL/uL (ref 3.87–5.11)
RDW: 15.6 % — ABNORMAL HIGH (ref 11.5–15.5)
WBC: 10.2 10*3/uL (ref 4.0–10.5)

## 2014-10-07 LAB — I-STAT CG4 LACTIC ACID, ED: LACTIC ACID, VENOUS: 0.89 mmol/L (ref 0.5–2.0)

## 2014-10-07 LAB — URINE MICROSCOPIC-ADD ON

## 2014-10-07 MED ORDER — SODIUM CHLORIDE 0.9 % IV BOLUS (SEPSIS): 500.0000 mL | Freq: Once | INTRAVENOUS | Status: DC

## 2014-10-07 MED ORDER — SODIUM CHLORIDE 0.9 % IV BOLUS (SEPSIS)
1000.0000 mL | Freq: Once | INTRAVENOUS | Status: AC
  Administered 2014-10-07: 1000 mL via INTRAVENOUS

## 2014-10-07 MED ORDER — SULFAMETHOXAZOLE-TRIMETHOPRIM 800-160 MG PO TABS: 1.0000 | ORAL_TABLET | Freq: Two times a day (BID) | ORAL | Status: AC

## 2014-10-07 MED ORDER — CEFTRIAXONE SODIUM 1 G IJ SOLR
1.0000 g | Freq: Once | INTRAMUSCULAR | Status: AC
  Administered 2014-10-07: 1 g via INTRAVENOUS
  Filled 2014-10-07: qty 10

## 2014-10-07 NOTE — ED Notes (Signed)
Introduced self to pt and family. Pt denies pain. Verbalized understanding of discharge instructions. Pts questions answered. Iv removed intact, site clean and dry. Pt remains HTN but decreased from admission. Pt denies further concern. Pt ambulated to exit with daughter without difficulty.

## 2014-10-07 NOTE — ED Provider Notes (Signed)
CSN: [id number]     Arrival date & time 10/07/14  1653 History   First MD Initiated Contact with Patient 10/07/14 1821     Chief Complaint  Patient presents with  . Diarrhea     (Consider location/radiation/quality/duration/timing/severity/associated sxs/prior Treatment) Patient is a 81 y.o. female presenting with diarrhea. The history is provided by the patient.  Diarrhea Quality:  Watery Severity:  Moderate Onset quality:  Gradual Number of episodes:  5x per day Duration:  10 days Timing:  Constant Progression:  Unchanged Relieved by:  Nothing Ineffective treatments:  Anti-motility medications Associated symptoms: no abdominal pain, no chills, no recent cough, no fever, no URI and no vomiting   Risk factors: no recent antibiotic use and no sick contacts     History reviewed. No pertinent past medical history. Past Surgical History  Procedure Laterality Date  . Hammer toe repair Left 09/29/2012    Lt #2 @ [facility]  . Excision ganglion toe Left 09/29/2012    Lt #2 @ [facility]  . Abdominal hysterectomy    . Cholecystectomy    . Adenoidectomy    . Tonsillectomy     History reviewed. No pertinent family history. Social History  Substance Use Topics  . Smoking status: Never Smoker   . Smokeless tobacco: Never Used  . Alcohol Use: No   OB History    No data available     Review of Systems  Constitutional: Negative for fever and chills.  Respiratory: Negative for cough and shortness of breath.   Gastrointestinal: Positive for diarrhea. Negative for vomiting and abdominal pain.  All other systems reviewed and are negative.     Allergies  Review of patient's allergies indicates no known allergies.  Home Medications   Prior to Admission medications   Medication Sig Start Date End Date Taking? Authorizing Provider  allopurinol (ZYLOPRIM) 300 MG tablet Take 300 mg by mouth daily.   Yes Historical Provider, MD  aspirin 81 MG tablet Take 81 mg by mouth daily.   Yes Historical  Provider, MD  diphenoxylate-atropine (LOMOTIL) 2.5-0.025 MG tablet Take 1-2 tablets by mouth 4 (four) times daily as needed for diarrhea or loose stools.   Yes Historical Provider, MD  HYDROcodone-ibuprofen (VICOPROFEN) 7.5-200 MG per tablet Take 1 tablet by mouth every 6 (six) hours as needed for pain. Patient not taking: Reported on 10/07/2014 09/28/12   Myeong Christianne Dolin, DPM  levothyroxine (SYNTHROID, LEVOTHROID) 100 MCG tablet Take 100 mcg by mouth daily before breakfast.  08/29/12  Yes Historical Provider, MD  lisinopril (PRINIVIL,ZESTRIL) 20 MG tablet Take 20 mg by mouth daily. 10/05/14  Yes Historical Provider, MD  simvastatin (ZOCOR) 40 MG tablet Take 40 mg by mouth daily.  08/25/12  Yes Historical Provider, MD   BP 184/70 mmHg  Pulse 79  Temp(Src) 98.2 F (36.8 C) (Oral)  Resp 18  Ht  (1.626 m)  Wt 161 lb (73.029 kg)  BMI 27.62 kg/m2  SpO2 95% Physical Exam  Constitutional: She is oriented to person, place, and time. She appears well-developed and well-nourished. No distress.  HENT:  Head: Normocephalic and atraumatic.  Mouth/Throat: Oropharynx is clear and moist.  Eyes: EOM are normal. Pupils are equal, round, and reactive to light.  Neck: Normal range of motion. Neck supple.  Cardiovascular: Normal rate and regular rhythm.  Exam reveals no friction rub.   No murmur heard. Pulmonary/Chest: Effort normal and breath sounds normal. No respiratory distress. She has no wheezes. She has no rales.  Abdominal: Soft.  She exhibits no distension. There is no tenderness. There is no rebound.  Musculoskeletal: Normal range of motion. She exhibits no edema.  Neurological: She is alert and oriented to person, place, and time.  Skin: No rash noted. She is not diaphoretic.  Nursing note and vitals reviewed.   ED Course  Procedures (including critical care time) Labs Review Labs Reviewed  COMPREHENSIVE METABOLIC PANEL - Abnormal; Notable for the following:    Glucose, Bld 117 (*)     Creatinine, Ser 1.67 (*)    ALT 12 (*)    GFR calc non Af Amer 27 (*)    GFR calc Af Amer 32 (*)    All other components within normal limits  CBC - Abnormal; Notable for the following:    Hemoglobin 11.2 (*)    HCT 34.5 (*)    RDW 15.6 (*)    All other components within normal limits  URINALYSIS, ROUTINE W REFLEX MICROSCOPIC (NOT AT [facility]) - Abnormal; Notable for the following:    APPearance CLOUDY (*)    Specific Gravity, Urine 1.004 (*)    Leukocytes, UA LARGE (*)    All other components within normal limits  URINE MICROSCOPIC-ADD ON - Abnormal; Notable for the following:    Bacteria, UA MANY (*)    All other components within normal limits  LIPASE, BLOOD  I-STAT CG4 LACTIC ACID, ED    Imaging Review No results found. I have personally reviewed and evaluated these images and lab results as part of my medical decision-making.   EKG Interpretation None      MDM   Final diagnoses:  Diarrhea, unspecified type  Acute cystitis without hematuria    81 year old female here with diarrhea for 10 days. Nonbloody, having about 5 times a day. No recent travel or anabolic. No fever, abdominal pain, vomiting. She is well appearing. No medical problems. She had no relief with antimotility agents. Here vitals are stable. Labs show mild UTI and mild acute kidney injury. Her creatinine is 1.67. We will rehydrate and repeat a BMP. She is well-appearing he does not need admission. BMP with better creatinine after hydration, creatinine 1.44. Given GI follow-up and instructed to follow-up with her PCP.  Elwin Mocha, MD 10/08/14 [phone]

## 2014-10-07 NOTE — ED Notes (Signed)
Pt c/o diarrhea x 10 days.  Pt states she has been taking Imodium and Lomotil without relief.  Pt denies abdominal pain, nausea, vomiting, urinary symptoms.

## 2014-10-07 NOTE — ED Notes (Signed)
Patient states she has been having constant diarrhea x 10 days. patient saw her PCP 2 days ago and was prescribed diphen/Atropine which has not helped. Paatient denies any abdominal pain, fever, or vomiting.

## 2014-10-07 NOTE — Discharge Instructions (Signed)

## 2014-10-10 ENCOUNTER — Encounter: Payer: Self-pay | Admitting: Internal Medicine

## 2014-10-10 ENCOUNTER — Ambulatory Visit (INDEPENDENT_AMBULATORY_CARE_PROVIDER_SITE_OTHER): Payer: Medicare Other | Admitting: Internal Medicine

## 2014-10-10 ENCOUNTER — Other Ambulatory Visit: Payer: Medicare Other

## 2014-10-10 VITALS — BP 168/90 | Ht 64.0 in | Wt 161.8 lb

## 2014-10-10 DIAGNOSIS — R197 Diarrhea, unspecified: Secondary | ICD-10-CM | POA: Diagnosis not present

## 2014-10-10 MED ORDER — METRONIDAZOLE 250 MG PO TABS
250.0000 mg | ORAL_TABLET | Freq: Three times a day (TID) | ORAL | Status: DC
Start: 2014-10-10 — End: 2014-12-07

## 2014-10-10 NOTE — Patient Instructions (Signed)
We have sent the following medications to your pharmacy for you to pick up at your convenience:  Flagyl  Your physician has requested that you go to the basement for the lab work before leaving today:   Please follow up with Dr. Marina Goodell on 11/14/2014 at 2:15pm    If you are doing better you may cancel.

## 2014-10-10 NOTE — Progress Notes (Signed)
HISTORY OF PRESENT ILLNESS:  Sharon Bailey is a 81 y.o. female who is referred to this office by the hospital emergency room regarding diarrhea. Patient has not been seen in this office since 2010 when she underwent routine colonoscopy with Dr. Sheryn Bison. The examination revealed severe diverticulosis but was otherwise normal. Patient's current history is that of acute diarrhea of 2 weeks duration. She describes up to 8 loose bowel movements per day with a rare nocturnal component. She denies abdominal pain, fever, or bleeding. She was concerned over the appearance of the stool and went to the emergency room 3 days ago. Workup was unremarkable except for UTI (without symptoms) (for which she was placed on Bactrim. Patient had not been on antibiotics previously. Her current medications are unchanged except for increased dosage of lisinopril. She denies a prior history of similar problems with diarrhea. No stool studies were obtained. She has been using Imodium without resolution of the problem. Review of laboratories from 10/07/2014 revealed BUN 18, creatinine 1.44, white blood cell count 10.2. She is somewhat anxious regarding her diarrhea but is pleasant and looks well  REVIEW OF SYSTEMS:  All non-GI ROS negative upon complete and extensive evaluation of all systems  History reviewed. No pertinent past medical history.  Past Surgical History  Procedure Laterality Date  . Hammer toe repair Left 09/29/2012    Lt #2 @ [facility]  . Excision ganglion toe Left 09/29/2012    Lt #2 @ [facility]  . Abdominal hysterectomy    . Cholecystectomy    . Adenoidectomy    . Tonsillectomy      Social History Sharon Bailey  reports that she has never smoked. She has never used smokeless tobacco. She reports that she does not drink alcohol or use illicit drugs.  family history is not on file.  No Known Allergies     PHYSICAL EXAMINATION: Vital signs: BP 168/90 mmHg  Ht  (1.626 m)  Wt 161 lb 12.8 oz  (73.392 kg)  BMI 27.76 kg/m2  Constitutional: generally well-appearing, no acute distress Psychiatric: alert and oriented x3, cooperative Eyes: extraocular movements intact, anicteric, conjunctiva pink Mouth: oral pharynx moist, no lesions Neck: supple without thyromegaly Lymph: no lymphadenopathy Cardiovascular: heart regular rate and rhythm, no murmur Lungs: clear to auscultation bilaterally Abdomen: soft, nontender, nondistended, no obvious ascites, no peritoneal signs, normal bowel sounds, no organomegaly Rectal: Omitted Extremities: no clubbing cyanosis or lower extremity edema bilaterally Skin: no lesions on visible extremities Neuro: No focal deficits. Cranial nerves intact.   ASSESSMENT:  #1. Acute diarrheal illness. The patient is not toxic and no inflammatory features identified. Hydrated and appears well. Suspect infectious entity. May be small bowel viral #2. UTI diagnosed 3 days ago. On Bactrim   PLAN:  #1. GI pathogen panel today on her stool #2. Trial of metronidazole 250 mg 3 times a day 7 days #3. Office follow-up with myself or physician assistant in 4 weeks. If she is doing better, she may cancel the appointment

## 2014-10-12 ENCOUNTER — Other Ambulatory Visit: Payer: Medicare Other

## 2014-10-12 DIAGNOSIS — R197 Diarrhea, unspecified: Secondary | ICD-10-CM

## 2014-10-13 LAB — CLOSTRIDIUM DIFFICILE BY PCR: CDIFFPCR: NOT DETECTED

## 2014-10-16 LAB — GASTROINTESTINAL PATHOGEN PANEL PCR
C. difficile Tox A/B, PCR: NEGATIVE
Campylobacter, PCR: NEGATIVE
Cryptosporidium, PCR: NEGATIVE
E COLI (ETEC) LT/ST, PCR: NEGATIVE
E COLI (STEC) STX1/STX2, PCR: NEGATIVE
E coli 0157, PCR: NEGATIVE
Giardia lamblia, PCR: NEGATIVE
NOROVIRUS, PCR: NEGATIVE
Rotavirus A, PCR: NEGATIVE
SHIGELLA, PCR: NEGATIVE
Salmonella, PCR: NEGATIVE

## 2014-11-14 ENCOUNTER — Ambulatory Visit: Payer: Medicare Other | Admitting: Internal Medicine

## 2014-12-06 ENCOUNTER — Telehealth: Payer: Self-pay | Admitting: Internal Medicine

## 2014-12-06 NOTE — Telephone Encounter (Signed)
Left message on home phone for pt to call back. Tried mobile number, no answer and unable to leave a message.

## 2014-12-07 MED ORDER — METRONIDAZOLE 250 MG PO TABS: 250.0000 mg | ORAL_TABLET | Freq: Three times a day (TID) | ORAL | Status: DC

## 2014-12-07 NOTE — Telephone Encounter (Signed)
Pt aware, script sent to pharmacy. Pt scheduled to see Dr. Marina GoodellPerry 01/16/15@10 :45am. Pt aware.

## 2014-12-07 NOTE — Telephone Encounter (Signed)
Pt states she saw Dr. Marina GoodellPerry in October for diarrhea and she was treated with Flagyl and this helped. Pt states she is having the same problem again and reports she has no control over her bowels. Pt wants to know if something can be called in for her to help with this. States she has tried Imodium and it is not helping. Dr. Rhea BeltonPyrtle as doc of the day please advise.

## 2014-12-07 NOTE — Telephone Encounter (Signed)
Last office visit with Dr. Marina GoodellPerry reviewed Molli Knockkay to retreat with metronidazole 250 mg 3 times a day 7 days given prior response Follow-up with Dr. Marina GoodellPerry for continuity

## 2014-12-10 ENCOUNTER — Telehealth: Payer: Self-pay | Admitting: Internal Medicine

## 2014-12-10 NOTE — Telephone Encounter (Signed)
Pt states she is having uncontrollable diarrhea. Called last week and was started on Flagyl but states this has not helped. Pt scheduled to see Dr. Marina GoodellPerry in January but states she cannot wait that long. Pt scheduled to see Lori Hvozdovic, PA-C 12/14/14@2 :15pm. Pt aware of appt.

## 2014-12-14 ENCOUNTER — Encounter: Payer: Self-pay | Admitting: Physician Assistant

## 2014-12-14 ENCOUNTER — Other Ambulatory Visit: Payer: Self-pay | Admitting: *Deleted

## 2014-12-14 ENCOUNTER — Other Ambulatory Visit (INDEPENDENT_AMBULATORY_CARE_PROVIDER_SITE_OTHER): Payer: Medicare Other

## 2014-12-14 ENCOUNTER — Ambulatory Visit (INDEPENDENT_AMBULATORY_CARE_PROVIDER_SITE_OTHER): Payer: Medicare Other | Admitting: Physician Assistant

## 2014-12-14 VITALS — BP 170/90 | HR 100 | Ht 64.0 in | Wt 155.0 lb

## 2014-12-14 DIAGNOSIS — K5730 Diverticulosis of large intestine without perforation or abscess without bleeding: Secondary | ICD-10-CM | POA: Diagnosis not present

## 2014-12-14 DIAGNOSIS — R197 Diarrhea, unspecified: Secondary | ICD-10-CM

## 2014-12-14 LAB — CBC WITH DIFFERENTIAL/PLATELET
BASOS ABS: 0 10*3/uL (ref 0.0–0.1)
BASOS PCT: 0.3 % (ref 0.0–3.0)
EOS ABS: 0.3 10*3/uL (ref 0.0–0.7)
Eosinophils Relative: 2.1 % (ref 0.0–5.0)
HCT: 36.2 % (ref 36.0–46.0)
Hemoglobin: 11.7 g/dL — ABNORMAL LOW (ref 12.0–15.0)
LYMPHS ABS: 2.5 10*3/uL (ref 0.7–4.0)
Lymphocytes Relative: 18.9 % (ref 12.0–46.0)
MCHC: 32.4 g/dL (ref 30.0–36.0)
MCV: 85.6 fl (ref 78.0–100.0)
Monocytes Absolute: 0.4 10*3/uL (ref 0.1–1.0)
Monocytes Relative: 3.4 % (ref 3.0–12.0)
NEUTROS ABS: 9.8 10*3/uL — AB (ref 1.4–7.7)
NEUTROS PCT: 75.3 % (ref 43.0–77.0)
PLATELETS: 248 10*3/uL (ref 150.0–400.0)
RBC: 4.23 Mil/uL (ref 3.87–5.11)
RDW: 16.5 % — AB (ref 11.5–15.5)
WBC: 13 10*3/uL — ABNORMAL HIGH (ref 4.0–10.5)

## 2014-12-14 LAB — IGA: IgA: 169 mg/dL (ref 68–378)

## 2014-12-14 MED ORDER — SACCHAROMYCES BOULARDII 250 MG PO CAPS: 250.0000 mg | ORAL_CAPSULE | Freq: Two times a day (BID) | ORAL | Status: DC

## 2014-12-14 MED ORDER — GLYCOPYRROLATE 1 MG PO TABS: 1.0000 mg | ORAL_TABLET | Freq: Three times a day (TID) | ORAL | Status: DC

## 2014-12-14 NOTE — Progress Notes (Signed)
Patient ID: Sharon Bailey, female   DOB: [birth year], 81 y.o.   MRN: [id number]     History of Present Illness: Sharon Bailey is a delightful 81 year old female who is known to Dr. Marina GoodellPerry. She was last seen by him on October 5 with a 2 week history of diarrhea. Stool for C. difficile was negative. She was empirically given a trial of Flagyl 250 mg 3 times daily for 7 days, and her diarrhea stopped. About 3 weeks ago, she again began to have diarrhea. She is having diarrhea 10-15 times per day. She has a nocturnal stooling. She has had no bright red blood per rectum or melena. Full her stools and has been having accidents. Anytime she eats or drinks anything, she has to have an immediate bowel movement. She has no nausea or vomiting. She has no fever, chills, or night sweats. Her appetite has been good, and her weight has been stable. No abdominal pain. Prior to the onset of her diarrhea she had not been on an antibiotic. She called last week and was again started on Flagyl but it has not helped. She has been using Imodium "by the handful" and it has not provided any relief. She has well water, but no one else in her house has diarrhea.   Past Medical History  Diagnosis Date  . Diverticulosis     Past Surgical History  Procedure Laterality Date  . Hammer toe repair Left 09/29/2012    Lt #2 @ [facility]  . Excision ganglion toe Left 09/29/2012    Lt #2 @ [facility]  . Abdominal hysterectomy    . Cholecystectomy    . Adenoidectomy    . Tonsillectomy     History reviewed. No pertinent family history. Social History  Substance Use Topics  . Smoking status: Never Smoker   . Smokeless tobacco: Never Used  . Alcohol Use: No   Current Outpatient Prescriptions  Medication Sig Dispense Refill  . allopurinol (ZYLOPRIM) 300 MG tablet Take 300 mg by mouth daily.    Marland Kitchen. aspirin 81 MG tablet Take 81 mg by mouth daily.    Marland Kitchen. levothyroxine (SYNTHROID, LEVOTHROID) 100 MCG tablet Take 100 mcg by mouth daily before  breakfast.     . lisinopril (PRINIVIL,ZESTRIL) 20 MG tablet Take 20 mg by mouth daily.    . metroNIDAZOLE (FLAGYL) 250 MG tablet Take 250 mg by mouth 3 (three) times daily.    . simvastatin (ZOCOR) 40 MG tablet Take 40 mg by mouth daily.     Marland Kitchen. glycopyrrolate (ROBINUL) 1 MG tablet Take 1 tablet (1 mg total) by mouth 3 (three) times daily. 60 tablet 1  . saccharomyces boulardii (FLORASTOR) 250 MG capsule Take 1 capsule (250 mg total) by mouth 2 (two) times daily. 60 capsule 0   No current facility-administered medications for this visit.   No Known Allergies   Review of Systems: Gen: Denies any fever, chills, sweats, anorexia, fatigue, weakness, malaise, weight loss, and sleep disorder CV: Denies chest pain, angina, palpitations, syncope, orthopnea, PND, peripheral edema, and claudication. Resp: Denies dyspnea at rest, dyspnea with exercise, cough, sputum, wheezing, coughing up blood, and pleurisy. GI: Denies vomiting blood, jaundice, and fecal incontinence.   Denies dysphagia or odynophagia. Has diarrhea of 3 weeks' duration. GU : Denies urinary burning, blood in urine, urinary frequency, urinary hesitancy, nocturnal urination, and urinary incontinence. MS: Denies joint pain, limitation of movement, and swelling, stiffness, low back pain, extremity pain. Denies muscle weakness, cramps, atrophy.  Derm: Denies  rash, itching, dry skin, hives, moles, warts, or unhealing ulcers.  Psych: Denies depression, anxiety, memory loss, suicidal ideation, hallucinations, paranoia, and confusion. Heme: Denies bruising, bleeding, and enlarged lymph nodes. Neuro:  Denies any headaches, dizziness, paresthesia Endo:  Denies any problems with DM, thyroid, adrenal  Endoscopies: Colonoscopy 03/19/2008 revealed severe diverticulosis in the sigmoid to descending, normal colonoscopy otherwise. Sigmoid stenosis noted.  Physical Exam: BP 170/90 mmHg  Pulse 100  Ht  (1.626 m)  Wt 155 lb (70.308 kg)  BMI  26.59 kg/m2 General: Pleasant, well developed , Caucasian female who is tearful because she is so upset over the frequency of her diarrhea. Head: Normocephalic and atraumatic Eyes:  sclerae anicteric, conjunctiva pink  Ears: Normal auditory acuity Lungs: Clear throughout to auscultation Heart: Regular rate and rhythm Abdomen: Soft, non distended, non-tender. No masses, no hepatomegaly. Normal bowel sounds Musculoskeletal: Symmetrical with no gross deformities  Extremities: No edema  Neurological: Alert oriented x 4, grossly nonfocal Psychological:  Alert and cooperative. Normal mood and affect  Assessment and Recommendations: 81 year old female with a history of severe diverticular disease presenting with 3 weeks of diarrhea. Had a similar episode in October that responded to Flagyl. Unfortunately, this episode is not responding to Flagyl thus far. A CBC, IgA, TTG, stool culture, stool for ova and parasites, and stool for C. difficile will be obtained. Will continue Flagyl 250 mg 3 times daily for an additional 7 days, and we'll start Florastor 250 mg by mouth twice a day. She will also be given a trial of Robinul 1 mg twice a day when necessary. She will be scheduled for colonoscopy to evaluate for possible microscopic colitis or other intraluminal pathology.The risks, benefits, and alternatives to colonoscopy with possible biopsy and possible polypectomy were discussed with the patient and they consent to proceed.          Lathyn Griggs, Tollie Pizza PA-C 12/14/2014,

## 2014-12-14 NOTE — Patient Instructions (Signed)
You have been scheduled for a colonoscopy. Please follow written instructions given to you at your visit today.  Please pick up your prep supplies at the pharmacy within the next 1-3 days. If you use inhalers (even only as needed), please bring them with you on the day of your procedure. Your physician has requested that you go to www.startemmi.com and enter the access code given to you at your visit today. This web site gives a general overview about your procedure. However, you should still follow specific instructions given to you by our office regarding your preparation for the procedure.  We have sent the following medications to your pharmacy for you to pick up at your convenience: Flagyl, Florastor and Robinal  Your physician has requested that you go to the basement for lab work before leaving today.

## 2014-12-14 NOTE — Progress Notes (Signed)
Agree with assessment and plans as outlined 

## 2014-12-17 ENCOUNTER — Other Ambulatory Visit (INDEPENDENT_AMBULATORY_CARE_PROVIDER_SITE_OTHER): Payer: Medicare Other

## 2014-12-17 DIAGNOSIS — R197 Diarrhea, unspecified: Secondary | ICD-10-CM

## 2014-12-17 LAB — CBC WITH DIFFERENTIAL/PLATELET
BASOS PCT: 0.5 % (ref 0.0–3.0)
Basophils Absolute: 0.1 10*3/uL (ref 0.0–0.1)
Eosinophils Absolute: 0.7 10*3/uL (ref 0.0–0.7)
Eosinophils Relative: 5.9 % — ABNORMAL HIGH (ref 0.0–5.0)
HCT: 34.2 % — ABNORMAL LOW (ref 36.0–46.0)
HEMOGLOBIN: 11 g/dL — AB (ref 12.0–15.0)
Lymphocytes Relative: 18.3 % (ref 12.0–46.0)
Lymphs Abs: 2 10*3/uL (ref 0.7–4.0)
MCHC: 32.2 g/dL (ref 30.0–36.0)
MCV: 85.9 fl (ref 78.0–100.0)
MONO ABS: 0.6 10*3/uL (ref 0.1–1.0)
Monocytes Relative: 5.5 % (ref 3.0–12.0)
Neutro Abs: 7.8 10*3/uL — ABNORMAL HIGH (ref 1.4–7.7)
Neutrophils Relative %: 69.8 % (ref 43.0–77.0)
Platelets: 243 10*3/uL (ref 150.0–400.0)
RBC: 3.98 Mil/uL (ref 3.87–5.11)
RDW: 17.5 % — AB (ref 11.5–15.5)
WBC: 11.1 10*3/uL — AB (ref 4.0–10.5)

## 2014-12-17 LAB — TISSUE TRANSGLUTAMINASE, IGA: Tissue Transglutaminase Ab, IgA: 1 U/mL (ref <4)

## 2014-12-18 ENCOUNTER — Other Ambulatory Visit: Payer: Self-pay | Admitting: *Deleted

## 2014-12-18 DIAGNOSIS — R197 Diarrhea, unspecified: Secondary | ICD-10-CM

## 2014-12-18 LAB — OVA AND PARASITE EXAMINATION: OP: NONE SEEN

## 2014-12-18 LAB — CLOSTRIDIUM DIFFICILE BY PCR: CDIFFPCR: NOT DETECTED

## 2014-12-20 ENCOUNTER — Encounter: Payer: Self-pay | Admitting: Internal Medicine

## 2014-12-20 ENCOUNTER — Ambulatory Visit (AMBULATORY_SURGERY_CENTER): Payer: Medicare Other | Admitting: Internal Medicine

## 2014-12-20 VITALS — BP 156/94 | HR 144 | Temp 96.5°F | Resp 17 | Ht 64.0 in | Wt 155.0 lb

## 2014-12-20 DIAGNOSIS — R197 Diarrhea, unspecified: Secondary | ICD-10-CM

## 2014-12-20 DIAGNOSIS — K5730 Diverticulosis of large intestine without perforation or abscess without bleeding: Secondary | ICD-10-CM

## 2014-12-20 MED ORDER — SODIUM CHLORIDE 0.9 % IV SOLN: 500.0000 mL | INTRAVENOUS | Status: DC

## 2014-12-20 NOTE — Op Note (Signed)
[facility] [address] [place] [id number]   COLONOSCOPY PROCEDURE REPORT  PATIENT: Sharon Bailey, Sharon Bailey  MR#: [id number] BIRTHDATE: [birth year] , 84  yrs. old GENDER: female ENDOSCOPIST: Roxy CedarJohn N Perry Jr, MD REFERRED XB:JYNWGNBY:Office Self, M.D. PROCEDURE DATE:  12/20/2014 PROCEDURE:   Colonoscopy with biopsy  ASA CLASS:   Class II INDICATIONS:chronic diarrhea. MEDICATIONS: Monitored anesthesia care and Propofol 220 mg IV  DESCRIPTION OF PROCEDURE:   After the risks benefits and alternatives of the procedure were thoroughly explained, informed consent was obtained.  The digital rectal exam revealed  external hemorrhoids.   The LB 1528  endoscope was introduced through the anus and advanced to the cecum, which was identified by both the appendix and ileocecal valve. No adverse events experienced.   The quality of the prep was excellent.  (Suprep was used)  The instrument was then slowly withdrawn as the colon was fully examined. Estimated blood loss is zero unless otherwise noted in this procedure report.      COLON FINDINGS: There was severe diverticulosis noted in the sigmoid colon.   The examination was otherwise normal.. Random colon biopsies taken.  Retroflexed views revealed internal hemorrhoids. The time to cecum = 4.3 Withdrawal time = 8.6   The scope was withdrawn and the procedure completed. COMPLICATIONS: There were no immediate complications.  ENDOSCOPIC IMPRESSION: 1.   Severe diverticulosis was noted in the sigmoid colon 2.   The examination was otherwise normal  RECOMMENDATIONS: 1.  Continue current medications 2.  Await biopsy results  . Dr. Marina GoodellPerry will send you a letter with the results and further recommendations (if any) 3. Office follow-up with Dr. Marina GoodellPerry in 4-6 weeks  eSigned:  Roxy CedarJohn N Perry Jr, MD 12/20/2014 2:52 PM   cc: The Patient and Lupe Carneyean Mitchell, MD

## 2014-12-20 NOTE — Progress Notes (Signed)
Called to room to assist during endoscopic procedure.  Patient ID and intended procedure confirmed with present staff. Received instructions for my participation in the procedure from the performing physician.  

## 2014-12-20 NOTE — Patient Instructions (Signed)
Impressions/recommendations:  Diverticulosis (handout given) High fiber diet (handout given)  Continue current medications Await biopsy results. Office follow up with Dr. Marina GoodellPerry in 4-6 weeks.  YOU HAD AN ENDOSCOPIC PROCEDURE TODAY AT THE [facility]:   Refer to the procedure report that was given to you for any specific questions about what was found during the examination.  If the procedure report does not answer your questions, please call your gastroenterologist to clarify.  If you requested that your care partner not be given the details of your procedure findings, then the procedure report has been included in a sealed envelope for you to review at your convenience later.  YOU SHOULD EXPECT: Some feelings of bloating in the abdomen. Passage of more gas than usual.  Walking can help get rid of the air that was put into your GI tract during the procedure and reduce the bloating. If you had a lower endoscopy (such as a colonoscopy or flexible sigmoidoscopy) you may notice spotting of blood in your stool or on the toilet paper. If you underwent a bowel prep for your procedure, you may not have a normal bowel movement for a few days.  Please Note:  You might notice some irritation and congestion in your nose or some drainage.  This is from the oxygen used during your procedure.  There is no need for concern and it should clear up in a day or so.  SYMPTOMS TO REPORT IMMEDIATELY:   Following lower endoscopy (colonoscopy or flexible sigmoidoscopy):  Excessive amounts of blood in the stool  Significant tenderness or worsening of abdominal pains  Swelling of the abdomen that is new, acute  Fever of 100F or higher  For urgent or emergent issues, a gastroenterologist can be reached at any hour by calling [phone].   DIET: Your first meal following the procedure should be a small meal and then it is ok to progress to your normal diet. Heavy or fried foods are harder to digest  and may make you feel nauseous or bloated.  Likewise, meals heavy in dairy and vegetables can increase bloating.  Drink plenty of fluids but you should avoid alcoholic beverages for 24 hours.  ACTIVITY:  You should plan to take it easy for the rest of today and you should NOT DRIVE or use heavy machinery until tomorrow (because of the sedation medicines used during the test).    FOLLOW UP: Our staff will call the number listed on your records the next business day following your procedure to check on you and address any questions or concerns that you may have regarding the information given to you following your procedure. If we do not reach you, we will leave a message.  However, if you are feeling well and you are not experiencing any problems, there is no need to return our call.  We will assume that you have returned to your regular daily activities without incident.  If any biopsies were taken you will be contacted by phone or by letter within the next 1-3 weeks.  Please call us at [phone] if you have not heard about the biopsies in 3 weeks.    SIGNATURES/CONFIDENTIALITY: You and/or your care partner have signed paperwork which will be entered into your electronic medical record.  These signatures attest to the fact that that the information above on your After Visit Summary has been reviewed and is understood.  Full responsibility of the confidentiality of this discharge information lies with you and/or your  care-partner.

## 2014-12-20 NOTE — Progress Notes (Signed)
Report to PACU, RN, vss, BBS= Clear.  

## 2014-12-21 ENCOUNTER — Telehealth: Payer: Self-pay | Admitting: *Deleted

## 2014-12-21 LAB — STOOL CULTURE

## 2014-12-21 NOTE — Telephone Encounter (Signed)
  Follow up Call-  Call back number 12/20/2014  Post procedure Call Back phone  # [phone]  Permission to leave phone message Yes     Patient questions:  Do you have a fever, pain , or abdominal swelling? No. Pain Score  0 *  Have you tolerated food without any problems? Yes.    Have you been able to return to your normal activities? Yes.    Do you have any questions about your discharge instructions: Diet   No. Medications  No. Follow up visit  No.  Do you have questions or concerns about your Care? No.  Actions: * If pain score is 4 or above: No action needed, pain <4.  Pt wanted me to make a note in her chart that she had an episode of diarrhea after dinner last night.  That is the reason she had a colonoscopy.  She was given a probiotic when she saw Lawson FiscalLori in the office and she plans to resume that today.  I advised her that she should take that 3 times a day as directed and we will contact her by letter with any further recommendations when Dr Marina GoodellPerry has reviewed her biopsy results.  Pt verbalized understanding.

## 2015-01-03 ENCOUNTER — Other Ambulatory Visit: Payer: Self-pay

## 2015-01-03 MED ORDER — BUDESONIDE 3 MG PO CPEP: 9.0000 mg | ORAL_CAPSULE | Freq: Every day | ORAL | Status: DC

## 2015-01-14 ENCOUNTER — Ambulatory Visit (INDEPENDENT_AMBULATORY_CARE_PROVIDER_SITE_OTHER): Payer: Medicare Other | Admitting: Internal Medicine

## 2015-01-14 ENCOUNTER — Encounter: Payer: Self-pay | Admitting: Internal Medicine

## 2015-01-14 VITALS — BP 150/96 | HR 72 | Ht 64.0 in | Wt 151.2 lb

## 2015-01-14 DIAGNOSIS — R197 Diarrhea, unspecified: Secondary | ICD-10-CM | POA: Diagnosis not present

## 2015-01-14 DIAGNOSIS — K52831 Collagenous colitis: Secondary | ICD-10-CM | POA: Diagnosis not present

## 2015-01-14 DIAGNOSIS — K52838 Other microscopic colitis: Secondary | ICD-10-CM | POA: Diagnosis not present

## 2015-01-14 MED ORDER — BUDESONIDE 3 MG PO CPEP: 9.0000 mg | ORAL_CAPSULE | Freq: Every day | ORAL | Status: DC

## 2015-01-14 NOTE — Patient Instructions (Signed)
We have sent the following medications to your pharmacy for you to pick up at your convenience:  Budesonide  Please follow up with Dr. Marina GoodellPerry on 02/18/2015 at 2:15pm

## 2015-01-14 NOTE — Progress Notes (Signed)
HISTORY OF PRESENT ILLNESS:  Sharon Bailey is a 82 y.o. female who has been evaluated in recent months for problematic chronic diarrhea. She eventually underwent complete colonoscopy with biopsies 12/20/2014. Examination was grossly normal except for severe diverticulosis. Biopsies revealed collagenous colitis. She was initiated on budesonide 9 mg daily approximate 2 weeks ago. She presents today for follow-up. The patient is delighted to report that she has had marked improvement in her bowel habits. She now describes one to 2 formed bowel movements each morning, which for her is normal. No other issues or complaints. No medication side effects.  REVIEW OF SYSTEMS:  All non-GI ROS negative upon review of all systems  Past Medical History  Diagnosis Date  . Diverticulosis   . Diverticulosis   . Collagenous colitis     Past Surgical History  Procedure Laterality Date  . Hammer toe repair Left 09/29/2012    Lt #2 @ [facility]  . Excision ganglion toe Left 09/29/2012    Lt #2 @ [facility]  . Abdominal hysterectomy    . Cholecystectomy    . Adenoidectomy    . Tonsillectomy      Social History Sharon BlitzJanice L Bailey  reports that she has never smoked. She has never used smokeless tobacco. She reports that she does not drink alcohol or use illicit drugs.  family history is not on file.  No Known Allergies     PHYSICAL EXAMINATION: Vital signs: BP 150/96 mmHg  Pulse 72  Ht 5\' 4"  (1.626 m)  Wt 151 lb 3.2 oz (68.584 kg)  BMI 25.94 kg/m2 General: Well-developed, well-nourished, no acute distress HEENT: Sclerae are anicteric, conjunctiva pink. Oral mucosa intact Lungs: Clear Heart: Regular Abdomen: soft, nontender, nondistended, no obvious ascites, no peritoneal signs, normal bowel sounds. No organomegaly. Extremities: No clubbing cyanosis or edema Psychiatric: alert and oriented x3. Cooperative   ASSESSMENT:  #1. Collagenous colitis. Diarrhea improved on budesonide   PLAN:  #1. Continue  budesonide 9 mg daily #2. Refill medication #3. Routine office follow-up 6 weeks. If she continues to do well, then we will embark on medication taper  Appointment spent face-to-face with the patient. Greater than 50% of the time use for counseling regarding the condition collagenous colitis and its treatment with expected outcomes

## 2015-02-15 ENCOUNTER — Encounter: Payer: Self-pay | Admitting: *Deleted

## 2015-02-18 ENCOUNTER — Ambulatory Visit (INDEPENDENT_AMBULATORY_CARE_PROVIDER_SITE_OTHER): Payer: Medicare Other | Admitting: Internal Medicine

## 2015-02-18 ENCOUNTER — Encounter: Payer: Self-pay | Admitting: Internal Medicine

## 2015-02-18 VITALS — BP 162/86 | HR 84 | Ht 64.0 in | Wt 154.4 lb

## 2015-02-18 DIAGNOSIS — K52831 Collagenous colitis: Secondary | ICD-10-CM | POA: Diagnosis not present

## 2015-02-18 DIAGNOSIS — R197 Diarrhea, unspecified: Secondary | ICD-10-CM

## 2015-02-18 NOTE — Patient Instructions (Signed)
Continue Budesonide 3 tablets for the rest of the month, 2 tablets daily for March and 1 tablet for April.  Follow up visit in 3 months

## 2015-02-18 NOTE — Progress Notes (Signed)
HISTORY OF PRESENT ILLNESS:  Sharon Bailey is a 82 y.o. female evaluated late last year for chronic diarrhea including colonoscopy with biopsies 12/20/2014. She was found that collagenous colitis and placed on budesonide 9 mg daily. She was seen in follow-up 01/14/2015 and doing well. She was maintained on budesonide 9 mg daily. She presents today for scheduled follow-up. The patient is pleased to report that she continues to do well. No further problems with diarrhea. Describes 2 formed bowel movements per day. She does not appreciate any medication side effects.  REVIEW OF SYSTEMS:  All non-GI ROS negative upon review  Past Medical History  Diagnosis Date  . Diverticulosis   . Collagenous colitis     Past Surgical History  Procedure Laterality Date  . Hammer toe repair Left 09/29/2012    Lt #2 @ [facility]  . Excision ganglion toe Left 09/29/2012    Lt #2 @ [facility]  . Abdominal hysterectomy    . Cholecystectomy    . Adenoidectomy    . Tonsillectomy      Social History ANITRA Bailey  reports that she has never smoked. She has never used smokeless tobacco. She reports that she does not drink alcohol or use illicit drugs.  family history is not on file.  No Known Allergies     PHYSICAL EXAMINATION: Vital signs: BP 162/86 mmHg  Pulse 84  Ht  (1.626 m)  Wt 154 lb 6.4 oz (70.035 kg)  BMI 26.49 kg/m2 General: Well-developed, well-nourished, no acute distress HEENT: Sclerae are anicteric, conjunctiva pink. Oral mucosa intact Lungs: Clear Heart: Regular Abdomen: soft, nontender, nondistended, no obvious ascites, no peritoneal signs, normal bowel sounds. No organomegaly. Extremities: No clubbing cyanosis or edema Psychiatric: alert and oriented x3. Cooperative    ASSESSMENT:  #1. Collagenous colitis. Doing well on budesonide 9 mg daily. Has been on this a little less than 2 months   PLAN:  #1. We will try to taper her budesonide. I want her to continue on 9 mg daily  through this month, 6 mg daily next month, 3 mg daily the following month and then stop. I would like to see her back for follow-up in 3 months. I advised her to contact the office in the interim should she develop symptoms suggesting relapse in which case she may need to resume higher dose of budesonide. She understands.

## 2015-05-15 ENCOUNTER — Encounter: Payer: Self-pay | Admitting: Internal Medicine

## 2015-05-15 ENCOUNTER — Ambulatory Visit (INDEPENDENT_AMBULATORY_CARE_PROVIDER_SITE_OTHER): Payer: Medicare Other | Admitting: Internal Medicine

## 2015-05-15 VITALS — BP 140/80 | HR 80 | Ht 64.0 in | Wt 159.0 lb

## 2015-05-15 DIAGNOSIS — K52831 Collagenous colitis: Secondary | ICD-10-CM

## 2015-05-15 NOTE — Progress Notes (Signed)
HISTORY OF PRESENT ILLNESS:  Sharon Bailey is a 82 y.o. female with a history of collagenous colitis diagnosed December 2016. Placed on budesonide 9 mg daily shortly thereafter. Was seen in the office for follow-up in January doing well. Most recently seen in February doing well. Since that time, we have tapered her budesonide down to 3 mg daily which she has been on for 10 days. She continues to report normal and regular bowel habits. No medication side effects. No new complaints. She has gained a few pounds.  REVIEW OF SYSTEMS:  All non-GI ROS negative upon review  Past Medical History  Diagnosis Date  . Diverticulosis   . Collagenous colitis     Past Surgical History  Procedure Laterality Date  . Hammer toe repair Left 09/29/2012    Lt #2 @ [facility]  . Excision ganglion toe Left 09/29/2012    Lt #2 @ [facility]  . Abdominal hysterectomy    . Cholecystectomy    . Adenoidectomy    . Tonsillectomy      Social History Sharon BlitzJanice L Siers  reports that she has never smoked. She has never used smokeless tobacco. She reports that she does not drink alcohol or use illicit drugs.  family history is not on file.  No Known Allergies     PHYSICAL EXAMINATION: Vital signs: BP 140/80 mmHg  Pulse 80  Ht 5\' 4"  (1.626 m)  Wt 159 lb (72.122 kg)  BMI 27.28 kg/m2 General: Well-developed, well-nourished, no acute distress HEENT: Sclerae are anicteric, conjunctiva pink. Oral mucosa intact Lungs: Clear Heart: Regular Abdomen: soft, nontender, nondistended, no obvious ascites, no peritoneal signs, normal bowel sounds. No organomegaly. Extremities: No edema Psychiatric: alert and oriented x3. Cooperative  ASSESSMENT:  #1. Collagenous colitis. Excellent response to budesonide. Tolerating taper   PLAN:  #1. Continue on budesonide 3 mg daily for 6 weeks and then decrease to 3 mg every other day for 4 weeks, and if doing well, stop  #2. Routine GI follow-up 3 months. Contact the office in the interim  for questions or problems  15 minutes was spent face-to-face with the patient. Greater than 50% a time use for counseling regarding her collagenous colitis and proper way to taper her medication

## 2015-05-15 NOTE — Patient Instructions (Signed)
Please follow up with Dr. Perry in 3 months 

## 2015-08-19 ENCOUNTER — Ambulatory Visit (INDEPENDENT_AMBULATORY_CARE_PROVIDER_SITE_OTHER): Payer: Medicare Other | Admitting: Internal Medicine

## 2015-08-19 ENCOUNTER — Encounter: Payer: Self-pay | Admitting: Internal Medicine

## 2015-08-19 VITALS — BP 146/80 | HR 76 | Ht 64.0 in | Wt 165.4 lb

## 2015-08-19 DIAGNOSIS — K52831 Collagenous colitis: Secondary | ICD-10-CM | POA: Diagnosis not present

## 2015-08-19 NOTE — Patient Instructions (Signed)
Follow up as needed.  Thank you for choosing [facility]  Dr John Perry  

## 2015-08-19 NOTE — Progress Notes (Signed)
HISTORY OF PRESENT ILLNESS:  Sharon Bailey is a 82 y.o. female with a history of collagenous colitis diagnosed December 2016. She was initiated on budesonide 9 mg daily shortly thereafter. She has been seen in follow-up several times with gradual taper of medication. At the time of her last visit in May 2017 she was asymptomatic on budesonide 3 mg daily. This was continued for 6 weeks and decreased every other day for 4 weeks. She has been off budesonide for 2 weeks. She continues with regular daily bowel habits and no other complaints.  REVIEW OF SYSTEMS:  All non-GI ROS negative upon review  Past Medical History:  Diagnosis Date  . Collagenous colitis   . Diverticulosis     Past Surgical History:  Procedure Laterality Date  . ABDOMINAL HYSTERECTOMY    . ADENOIDECTOMY    . CHOLECYSTECTOMY    . Excision Ganglion Toe Left 09/29/2012   Lt #2 @ [facility]  . Hammer toe repair Left 09/29/2012   Lt #2 @ [facility]  . TONSILLECTOMY      Social History Sharon Bailey  reports that she has never smoked. She has never used smokeless tobacco. She reports that she does not drink alcohol or use drugs.  family history is not on file.  No Known Allergies     PHYSICAL EXAMINATION: Vital signs: BP (!) 146/80 (BP Location: Left Arm, Patient Position: Sitting, Cuff Size: Normal)   Pulse 76   Ht 5\' 4"  (1.626 m) Comment: height measured without shoes  Wt 165 lb 6 oz (75 kg)   BMI 28.39 kg/m  General: Well-developed, well-nourished, no acute distress HEENT: Sclerae are anicteric, conjunctiva pink. Oral mucosa intact Lungs: Clear Heart: Regular Abdomen: soft, nontender, nondistended, no obvious ascites, no peritoneal signs, normal bowel sounds. No organomegaly. Extremities: No Clubbing cyanosis or edema. Ecchymoses present on the lower extremities Psychiatric: alert and oriented x3. Cooperative    ASSESSMENT:  #1. Collagenous colitis. Excellent response to budesonide. Now off medication with normal  bowel habits   PLAN:  #1. I discussed with the patient today her condition. There is a possibility of relapse which may require repeat therapy. She understands. As such, she will follow-up as needed  15 minutes was spent face-to-face with the patient. Greater than 50% a time use for counseling regarding management of collagenous colitis

## 2015-12-02 ENCOUNTER — Telehealth: Payer: Self-pay

## 2015-12-02 ENCOUNTER — Telehealth: Payer: Self-pay | Admitting: Internal Medicine

## 2015-12-02 MED ORDER — BUDESONIDE 3 MG PO CPEP: 9.0000 mg | ORAL_CAPSULE | Freq: Every day | ORAL | 2 refills | Status: DC

## 2015-12-02 NOTE — Telephone Encounter (Signed)
-----   Message from Hilarie FredricksonJohn N Perry, MD sent at 12/02/2015 11:08 AM EST ----- Only refill and restart if the complaint is recurrence of diarrhea. If so, office follow up in 4-6 weeks on med. If it is nonspecific or non-GI, she needs to see PCP. File this as phone note. Thanks   ----- Message ----- From: Jeanine LuzLeslie K Cire Deyarmin, CMA Sent: 12/02/2015  10:55 AM To: Hilarie FredricksonJohn N Perry, MD  Patient says she has "not been feeling well lately" and wants a refill of Budesonide.  Please advise.

## 2015-12-02 NOTE — Telephone Encounter (Signed)
Sent message to Dr. Marina GoodellPerry asking if ok to refill Budesonide

## 2015-12-02 NOTE — Telephone Encounter (Signed)
Clarified with patient that her diarrhea had returned, states her bowels have "turned to water" with multiple loose stools a day.  Per Dr. Marina GoodellPerry, I refilled her Budesonide and scheduled a follow up office visit for 01/17/2016.  Patient agreed

## 2016-01-17 ENCOUNTER — Encounter: Payer: Self-pay | Admitting: Internal Medicine

## 2016-01-17 ENCOUNTER — Ambulatory Visit (INDEPENDENT_AMBULATORY_CARE_PROVIDER_SITE_OTHER): Payer: Medicare Other | Admitting: Internal Medicine

## 2016-01-17 VITALS — BP 166/74 | HR 90 | Ht 64.0 in | Wt 164.0 lb

## 2016-01-17 DIAGNOSIS — K52831 Collagenous colitis: Secondary | ICD-10-CM | POA: Diagnosis not present

## 2016-01-17 MED ORDER — BUDESONIDE 3 MG PO CPEP: 9.0000 mg | ORAL_CAPSULE | Freq: Every day | ORAL | 6 refills | Status: DC

## 2016-01-17 NOTE — Patient Instructions (Signed)
We have sent the following medications to your pharmacy for you to pick up at your convenience:  Budesonide  As discussed with Dr. Marina GoodellPerry, take your Budesonide as follows:  9mg  (3 tab) for 6 weeks then 6mg  (2 tab) for one month then 3mg  (1tab) for one month  If you are doing well at this point you may stop taking it.  If you have any problems call our office  Please follow up with Dr. Marina GoodellPerry in 6 months

## 2016-01-17 NOTE — Progress Notes (Signed)
HISTORY OF PRESENT ILLNESS:  Sharon Bailey is a 83 y.o. female with a history of collagenous colitis diagnosed in December 2016. Excellent response to budesonide with subsequent taper. Doing well at time of her last office visit August 2017 on no medication. Contacted the office after Thanksgiving with recurrent symptoms. Restarted on budesonide 9 mg daily. Symptoms improved after several weeks. Currently feeling well with 3 formed bowel movements each morning. No obvious medication side effects or other concerns. No other complaints.  REVIEW OF SYSTEMS:  All non-GI ROS negative entirely  Past Medical History:  Diagnosis Date  . Collagenous colitis   . Diverticulosis   . Hyperlipidemia   . Thyroid disease     Past Surgical History:  Procedure Laterality Date  . ABDOMINAL HYSTERECTOMY    . ADENOIDECTOMY    . CHOLECYSTECTOMY    . Excision Ganglion Toe Left 09/29/2012   Lt #2 @ [facility]  . Hammer toe repair Left 09/29/2012   Lt #2 @ [facility]  . TONSILLECTOMY      Social History Sharon Bailey  reports that she has never smoked. She has never used smokeless tobacco. She reports that she does not drink alcohol or use drugs.  family history is not on file.  No Known Allergies     PHYSICAL EXAMINATION: Vital signs: BP (!) 166/74   Pulse 90   Ht 5\' 4"  (1.626 m)   Wt 164 lb (74.4 kg)   BMI 28.15 kg/m   Constitutional: generally well-appearing, no acute distress Psychiatric: alert and oriented x3, cooperative Eyes: extraocular movements intact, anicteric, conjunctiva pink Mouth: oral pharynx moist, no lesions Neck: supple no lymphadenopathy Cardiovascular: heart regular rate and rhythm, no murmur Lungs: clear to auscultation bilaterally Abdomen: soft, nontender, nondistended, no obvious ascites, no peritoneal signs, normal bowel sounds, no organomegaly Rectal:Omitted Extremities: no clubbing cyanosis or lower extremity edema bilaterally Skin: no lesions on visible extremities Neuro:  No focal deficits. Cranial nerves intact  ASSESSMENT:  #1. Collagenous colitis. Recent flare off medication. Nice response to resumption of desonide  PLAN:  #1. Continue be desonide 9 mg daily for 6 weeks. If doing well decrease to 6 mg daily for one month. If doing well then decrease to 3 mg daily for one month. If doing well then stop. Contact this office at any point for recurrent symptoms. #2. Otherwise routine GI follow-up 6 months  15 minutes spent face-to-face with the patient. Greater than 50% a time as use for counseling regarding management of her collagenous colitis

## 2016-04-06 ENCOUNTER — Telehealth: Payer: Self-pay | Admitting: Internal Medicine

## 2016-04-08 NOTE — Telephone Encounter (Signed)
Spoke with patient to make sure she was still tapering the Budesonide, as discussed at last visit, in which case she would not need a 90 day supply as offered by the pharmacist.  Patient stated she is planning to be down to one tab a day and then stop by May.  She said another 30 days should be sufficient.  I called the pharmacist to explain this.  She acknowledged and understood

## 2016-04-16 ENCOUNTER — Ambulatory Visit (INDEPENDENT_AMBULATORY_CARE_PROVIDER_SITE_OTHER): Payer: Medicare Other | Admitting: Internal Medicine

## 2016-04-16 ENCOUNTER — Encounter: Payer: Self-pay | Admitting: Internal Medicine

## 2016-04-16 ENCOUNTER — Encounter (INDEPENDENT_AMBULATORY_CARE_PROVIDER_SITE_OTHER): Payer: Self-pay

## 2016-04-16 VITALS — BP 158/90 | HR 91 | Ht 64.0 in | Wt 161.8 lb

## 2016-04-16 DIAGNOSIS — I10 Essential (primary) hypertension: Secondary | ICD-10-CM | POA: Diagnosis not present

## 2016-04-16 DIAGNOSIS — I119 Hypertensive heart disease without heart failure: Secondary | ICD-10-CM | POA: Diagnosis not present

## 2016-04-16 DIAGNOSIS — R002 Palpitations: Secondary | ICD-10-CM | POA: Diagnosis not present

## 2016-04-16 MED ORDER — HYDROCHLOROTHIAZIDE 25 MG PO TABS: ORAL_TABLET | ORAL | 1 refills | Status: DC

## 2016-04-16 NOTE — Progress Notes (Signed)
New Outpatient Visit Date: 04/16/2016  Referring Provider: L.Lupe Carney, MD [address] [place], [place] [address]  Chief Complaint: Palpitations  HPI:  Sharon Bailey is a 83 y.o. female who is being seen today for the evaluation of palpitations at the request of Dr. Clovis Riley. She has a history of hypertension, hyperlipidemia, hypothyroidism, and gout. She has recently noticed occasional fluttering in her chest. She describes it as a "vibration" the last a few minutes. Symptoms first began about a month ago around the time of a bladder infection. She thought this may be a side effect of antibiotics that were prescribed, though the symptoms have persisted despite completing therapy. The episodes typically occur 3 times a week and last 1-2 minutes at a time. The flutters sometimes take her breath away, though she otherwise does not feel short of breath. She notes generalized fatigue. She has not had any chest pain, lightheadedness, edema, orthopnea, or PND. She denies a history of prior cardiovascular disease.  Sharon Bailey also notes that her blood pressure has been quite elevated the last few months. She does not check it regularly at home but has been told of high blood pressure at prior doctor visits. She reports having been tested for sleep apnea more than 20 years ago and states that the test was normal. She and her family have noticed snoring as well as apneic episodes at night. She drinks 2 cups of caffeinated coffee per day as well as an occasional caffeinated soda. She has never undergone cardiac testing, including echocardiogram, event monitor, stress testing, and catheterization. --------------------------------------------------------------------------------------------------  Cardiovascular History & Procedures: Cardiovascular Problems:  Palpitations  Risk Factors:  Hypertension, hyperlipidemia, and age greater than 9  Cath/PCI:  None  CV Surgery:  None  EP  Procedures and Devices:  None  Non-Invasive Evaluation(s):  NOne  Recent CV Pertinent Labs: Lab Results  Component Value Date   INR 2.38 (H) 07/05/2010   K 3.9 10/07/2014   BUN 18 10/07/2014   CREATININE 1.44 (H) 10/07/2014    --------------------------------------------------------------------------------------------------  Past Medical History:  Diagnosis Date  . Abnormal EKG   . Collagenous colitis   . Collagenous colitis   . Diverticulitis   . Diverticulosis   . Fluttering heart   . GERD (gastroesophageal reflux disease)   . Gout   . Gout attack 09/28/2012  . Hammer toe of left foot 09/28/2012  . Hyperlipidemia   . Hypertension   . Hypothyroidism   . Irregular heart beat   . Pain in joint, ankle and foot 09/28/2012  . Status post foot surgery 10/11/2012  . Thyroid disease     Past Surgical History:  Procedure Laterality Date  . ABDOMINAL HYSTERECTOMY    . ADENOIDECTOMY    . CHOLECYSTECTOMY    . Excision Ganglion Toe Left 09/29/2012   Lt #2 @ [facility]  . Hammer toe repair Left 09/29/2012   Lt #2 @ [facility]  . TONSILLECTOMY      Outpatient Encounter Prescriptions as of 04/16/2016  Medication Sig  . allopurinol (ZYLOPRIM) 300 MG tablet Take 300 mg by mouth daily.  Marland Kitchen aspirin 81 MG tablet Take 81 mg by mouth daily.  . budesonide (ENTOCORT EC) 3 MG 24 hr capsule Take 6 mg by mouth every morning.  Marland Kitchen ibuprofen (ADVIL,MOTRIN) 200 MG tablet Take 200 mg by mouth every 6 (six) hours as needed (pain).   Marland Kitchen levothyroxine (SYNTHROID, LEVOTHROID) 100 MCG tablet Take 100 mcg by mouth daily before breakfast.   .  lisinopril (PRINIVIL,ZESTRIL) 20 MG tablet Take 20 mg by mouth daily.  . loratadine (CLARITIN) 10 MG tablet Take 10 mg by mouth daily as needed for allergies.   . Multiple Vitamins-Minerals (MULTI FOR HER 50+) TABS Take 1 tablet by mouth daily.   . simvastatin (ZOCOR) 40 MG tablet Take 40 mg by mouth daily.   . [DISCONTINUED] budesonide (ENTOCORT EC) 3 MG 24 hr capsule Take 3  capsules (9 mg total) by mouth daily. (Patient not taking: Reported on 04/16/2016)  . [DISCONTINUED] OXYBUTYNIN CHLORIDE ER PO Take 5 mg by mouth daily as needed (Take as directed).    No facility-administered encounter medications on file as of 04/16/2016.     Allergies: Sulfamethoxazole-trimethoprim  Social History   Social History  . Marital status: Married    Spouse name: N/A  . Number of children: N/A  . Years of education: N/A   Occupational History  . Not on file.   Social History Main Topics  . Smoking status: Never Smoker  . Smokeless tobacco: Never Used  . Alcohol use No  . Drug use: No  . Sexual activity: Not on file   Other Topics Concern  . Not on file   Social History Narrative  . No narrative on file    No family history on file.  Review of Systems: A 12-system review of systems was performed and was negative except as noted in the HPI.  --------------------------------------------------------------------------------------------------  Physical Exam: BP (!) 158/90 (BP Location: Right Arm)   Pulse 91   Ht  (1.626 m)   Wt 161 lb 12.8 oz (73.4 kg)   SpO2 97%   BMI 27.77 kg/m   General:  Well-developed, well-nourished elderly woman, seated comfortably in the exam room. She is accompanied by her daughter. HEENT: No conjunctival pallor or scleral icterus.  Moist mucous membranes.  OP clear. Neck: Supple without lymphadenopathy, thyromegaly, JVD, or HJR.  No carotid bruit. Lungs: Normal work of breathing.  Clear to auscultation bilaterally without wheezes or crackles. Heart: Regular rate and rhythm without murmurs, rubs, or gallops.  Non-displaced PMI. Abd: Bowel sounds present.  Soft, NT/ND without hepatosplenomegaly Ext: No lower extremity edema.  Radial, PT, and DP pulses are 2+ bilaterally Skin: warm and dry without rash Neuro: CNIII-XII intact.  Strength and fine-touch sensation intact in upper and lower extremities bilaterally. Psych: Normal  mood and affect.  EKG:  Normal sinus rhythm with left axis deviation and voltage criteria for left ventricular hypertrophy. No significant change from prior tracing on 06/27/10 (I have personally reviewed both tracings).  Lab Results  Component Value Date   WBC 11.1 (H) 12/17/2014   HGB 11.0 (L) 12/17/2014   HCT 34.2 (L) 12/17/2014   MCV 85.9 12/17/2014   PLT 243.0 12/17/2014    Lab Results  Component Value Date   NA 140 10/07/2014   K 3.9 10/07/2014   CL 113 (H) 10/07/2014   CO2 21 (L) 10/07/2014   BUN 18 10/07/2014   CREATININE 1.44 (H) 10/07/2014   GLUCOSE 106 (H) 10/07/2014   ALT 12 (L) 10/07/2014    No results found for: CHOL, HDL, LDLCALC, LDLDIRECT, TRIG, CHOLHDL  --------------------------------------------------------------------------------------------------  ASSESSMENT AND PLAN: Palpitations Symptoms are intermittent and described as a "vibration" feeling in the chest. Patient reports mild accompanying shortness of breath but otherwise no worrisome symptoms like chest pain or lightheadedness. We have agreed to further evaluation with a 48 hour Holter monitor, given that she experiences the symptoms typicalMarland Kitchenly every other day.  I have encouraged her to decrease her caffeine intake.  Hypertension with evidence of hypertensive heart disease The patient reports a long history of hypertension, with her blood pressure being suboptimally controlled based on blood pressure measurements in our system dating back to 2016. Her blood pressure today remains moderately elevated. We have agreed to add hydrochlorothiazide 12.5 mg daily and have the patient return for a basic metabolic panel in about 2 weeks. I suspect she will need further escalation of HCTZ and lisinopril in the future. We will obtain a transthoracic echocardiogram to evaluate for structural heart disease in the setting of long-standing hypertension, given her palpitations and EKG with evidence of left ventricular  hypertrophy. The patient and her daughter report several symptoms concerning for obstructive sleep apnea. However, Sharon Bailey does not wish to consider polysomnography at this time. We will readdress this at follow-up.  Follow-up: Return to clinic in one month.  Yvonne Kendall, MD 04/18/2016 4:04 PM

## 2016-04-16 NOTE — Patient Instructions (Signed)
Medication Instructions:  Start HCTZ (hydrochlorothiazide) 12.5mg  daily. This will be 1/2 of a  tablet daily  Labwork: Your physician recommends that you return for lab work in: about 2 weeks--BMET.   Testing/Procedures: Your physician has requested that you have an echocardiogram. Echocardiography is a painless test that uses sound waves to create images of your heart. It provides your doctor with information about the size and shape of your heart and how well your heart's chambers and valves are working. This procedure takes approximately one hour. There are no restrictions for this procedure.  Your physician has recommended that you wear a holter monitor. Holter monitors are medical devices that record the heart's electrical activity. Doctors most often use these monitors to diagnose arrhythmias. Arrhythmias are problems with the speed or rhythm of the heartbeat. The monitor is a small, portable device. You can wear one while you do your normal daily activities. This is usually used to diagnose what is causing palpitations/syncope (passing out).  48 HOUR  Follow-Up: Your physician recommends that you schedule a follow-up appointment in: 1 month with Dr End.        If you need a refill on your cardiac medications before your next appointment, please call your pharmacy.

## 2016-04-18 ENCOUNTER — Encounter: Payer: Self-pay | Admitting: Internal Medicine

## 2016-04-18 DIAGNOSIS — R002 Palpitations: Secondary | ICD-10-CM | POA: Insufficient documentation

## 2016-04-18 DIAGNOSIS — I10 Essential (primary) hypertension: Secondary | ICD-10-CM | POA: Insufficient documentation

## 2016-04-18 DIAGNOSIS — I119 Hypertensive heart disease without heart failure: Secondary | ICD-10-CM | POA: Insufficient documentation

## 2016-04-18 HISTORY — DX: Palpitations: R00.2

## 2016-04-30 ENCOUNTER — Ambulatory Visit (INDEPENDENT_AMBULATORY_CARE_PROVIDER_SITE_OTHER): Payer: Medicare Other

## 2016-04-30 ENCOUNTER — Other Ambulatory Visit: Payer: Self-pay

## 2016-04-30 ENCOUNTER — Ambulatory Visit (HOSPITAL_COMMUNITY): Payer: Medicare Other | Attending: Cardiovascular Disease

## 2016-04-30 ENCOUNTER — Other Ambulatory Visit: Payer: Medicare Other | Admitting: *Deleted

## 2016-04-30 DIAGNOSIS — I351 Nonrheumatic aortic (valve) insufficiency: Secondary | ICD-10-CM | POA: Insufficient documentation

## 2016-04-30 DIAGNOSIS — I119 Hypertensive heart disease without heart failure: Secondary | ICD-10-CM | POA: Diagnosis not present

## 2016-04-30 DIAGNOSIS — I348 Other nonrheumatic mitral valve disorders: Secondary | ICD-10-CM | POA: Insufficient documentation

## 2016-04-30 DIAGNOSIS — I10 Essential (primary) hypertension: Secondary | ICD-10-CM | POA: Diagnosis not present

## 2016-04-30 DIAGNOSIS — R002 Palpitations: Secondary | ICD-10-CM | POA: Insufficient documentation

## 2016-04-30 LAB — BASIC METABOLIC PANEL
BUN/Creatinine Ratio: 22 (ref 12–28)
BUN: 26 mg/dL (ref 8–27)
CHLORIDE: 101 mmol/L (ref 96–106)
CO2: 20 mmol/L (ref 18–29)
Calcium: 9.7 mg/dL (ref 8.7–10.3)
Creatinine, Ser: 1.19 mg/dL — ABNORMAL HIGH (ref 0.57–1.00)
GFR, EST AFRICAN AMERICAN: 49 mL/min/{1.73_m2} — AB (ref >59)
GFR, EST NON AFRICAN AMERICAN: 42 mL/min/{1.73_m2} — AB (ref >59)
Glucose: 83 mg/dL (ref 65–99)
POTASSIUM: 4.2 mmol/L (ref 3.5–5.2)
SODIUM: 142 mmol/L (ref 134–144)

## 2016-04-30 NOTE — Addendum Note (Signed)
Addended by: Tonita Phoenix on: 04/30/2016 08:52 AM   Modules accepted: Orders

## 2016-05-18 ENCOUNTER — Ambulatory Visit (INDEPENDENT_AMBULATORY_CARE_PROVIDER_SITE_OTHER): Payer: Medicare Other | Admitting: Internal Medicine

## 2016-05-18 ENCOUNTER — Encounter (INDEPENDENT_AMBULATORY_CARE_PROVIDER_SITE_OTHER): Payer: Self-pay

## 2016-05-18 ENCOUNTER — Encounter: Payer: Self-pay | Admitting: Internal Medicine

## 2016-05-18 VITALS — BP 142/84 | HR 82 | Ht 64.0 in | Wt 162.0 lb

## 2016-05-18 DIAGNOSIS — I10 Essential (primary) hypertension: Secondary | ICD-10-CM | POA: Diagnosis not present

## 2016-05-18 DIAGNOSIS — R002 Palpitations: Secondary | ICD-10-CM

## 2016-05-18 DIAGNOSIS — R5383 Other fatigue: Secondary | ICD-10-CM

## 2016-05-18 MED ORDER — HYDROCHLOROTHIAZIDE 25 MG PO TABS: 25.0000 mg | ORAL_TABLET | Freq: Every day | ORAL | 1 refills | Status: DC

## 2016-05-18 NOTE — Patient Instructions (Signed)
Medication Instructions:  Increase HCTZ (hydrochlorothiazide) to 25 mg daily.  Labwork: Your physician recommends that you return for lab work in: 2-3 weeks--BMET.   Testing/Procedures: None   Follow-Up: Your physician recommends that you schedule a follow-up appointment in: 3 months with Dr End.        If you need a refill on your cardiac medications before your next appointment, please call your pharmacy.

## 2016-05-18 NOTE — Progress Notes (Addendum)
Follow-up Outpatient Visit Date: 05/18/2016  Primary Care Provider: Alroy Dust, L.Marlou Sa, [place] [address] [place] [address]  Chief Complaint: Follow-up palpitations  HPI:  Ms. Conerly is a 83 y.o. year-old female with history of hypertension, hyperlipidemia, hypothyroidism, and gout, who presents for follow-up of palpitations. She is accompanied by her daughter today. Ms. Siegrist reports continued palpitations described as a skipped beat or brief quiver in the left chest. Overall frequency has decreased since our last visit. She is tolerating HCTZ well without side effects. She has not had chest pain, shortness of breath, lightheadedness, orthopnea, or PND. She has occasional mild dependent edema. Her main concern is continued fatigue with any activity that has been present for at least 2 months (though her daughter suggests that it may have been present much longer). Ms. Paulette believes that the fatigue began around the time of a bladder infection and allergic reaction that occurred with antibiotic therapy. She states that her bladder infection was cured but that the fatigue has remained. She sleeps well at night and generally feels well-rested.  --------------------------------------------------------------------------------------------------  Cardiovascular History & Procedures: Cardiovascular Problems:  Palpitations  Risk Factors:  Hypertension, hyperlipidemia, and age greater than 45  Cath/PCI:  None  CV Surgery:  None  EP Procedures and Devices:  48-hour Holter monitor (04/30/16): Predominantly sinus rhythm with rare PACs and single episode of PSVT (6 beats).  Non-Invasive Evaluation(s):  TTE (04/30/16): Normal LV size with mild LVH. LVEF 55-60% with normal wall motion and grade 1 diastolic dysfunction. Mild AI. MAC. Normal RV size and function. Normal PA pressure.  Recent CV Pertinent Labs: Lab Results  Component Value Date   INR 2.38 (H) 07/05/2010   K 4.2 04/30/2016   BUN 26 04/30/2016   CREATININE 1.19 (H) 04/30/2016    Past medical and surgical history were reviewed and updated in EPIC.  Outpatient Encounter Prescriptions as of 05/18/2016  Medication Sig  . allopurinol (ZYLOPRIM) 300 MG tablet Take 300 mg by mouth daily.  Marland Kitchen aspirin 81 MG tablet Take 81 mg by mouth daily.  . budesonide (ENTOCORT EC) 3 MG 24 hr capsule Take 6 mg by mouth every morning.  . hydrochlorothiazide (HYDRODIURIL) 25 MG tablet Take 1/2 tablet (12.28m) by mouth daily  . ibuprofen (ADVIL,MOTRIN) 200 MG tablet Take 200 mg by mouth every 6 (six) hours as needed (pain).   .Marland Kitchenlevothyroxine (SYNTHROID, LEVOTHROID) 100 MCG tablet Take 100 mcg by mouth daily before breakfast.   . lisinopril (PRINIVIL,ZESTRIL) 20 MG tablet Take 20 mg by mouth daily.  .Marland Kitchenloratadine (CLARITIN) 10 MG tablet Take 10 mg by mouth daily as needed for allergies.   . Multiple Vitamins-Minerals (MULTI FOR HER 50+) TABS Take 1 tablet by mouth daily.   . simvastatin (ZOCOR) 40 MG tablet Take 40 mg by mouth daily.    No facility-administered encounter medications on file as of 05/18/2016.     Allergies: Sulfamethoxazole-trimethoprim  Social History   Social History  . Marital status: Married    Spouse name: N/A  . Number of children: N/A  . Years of education: N/A   Occupational History  . Not on file.   Social History Main Topics  . Smoking status: Never Smoker  . Smokeless tobacco: Never Used  . Alcohol use No  . Drug use: No  . Sexual activity: Not on file   Other Topics Concern  . Not on file   Social History Narrative  . No narrative on file  Family History  Problem Relation Age of Onset  . Breast cancer Mother   . Memory loss Father   . Heart disease Sister   . Rheumatic fever Sister   . Lung disease Brother     Review of Systems: A 12-system review of systems was performed and was negative except as noted in the  HPI.  --------------------------------------------------------------------------------------------------  Physical Exam: BP (!) 142/84   Pulse 82   Ht _0  (1.626 m)   Wt 162 lb (73.5 kg)   SpO2 100%   BMI 27.81 kg/m   General:  Well-developed, well-nourished elderly woman, seated comfortably in the exam room. She is accompanied by her daughter. HEENT: No conjunctival pallor or scleral icterus.  Moist mucous membranes.  OP clear. Neck: Supple without lymphadenopathy, thyromegaly, JVD, or HJR. Lungs: Normal work of breathing.  Clear to auscultation bilaterally without wheezes or crackles. Heart: Regular rate and rhythm without murmurs, rubs, or gallops.  Non-displaced PMI. Abd: Bowel sounds present.  Soft, NT/ND without hepatosplenomegaly Ext: Trace pretibial edema bilaterally.  2+ radial and 1+ pedal pulses bilaterally. Skin: think skin with multiple ecchymoses on the upper and lower extremities.   Lab Results  Component Value Date   WBC 11.1 (H) 12/17/2014   HGB 11.0 (L) 12/17/2014   HCT 34.2 (L) 12/17/2014   MCV 85.9 12/17/2014   PLT 243.0 12/17/2014    Lab Results  Component Value Date   NA 142 04/30/2016   K 4.2 04/30/2016   CL 101 04/30/2016   CO2 20 04/30/2016   BUN 26 04/30/2016   CREATININE 1.19 (H) 04/30/2016   GLUCOSE 83 04/30/2016   ALT 12 (L) 10/07/2014    No results found for: CHOL, HDL, LDLCALC, LDLDIRECT, TRIG, CHOLHDL  --------------------------------------------------------------------------------------------------  ASSESSMENT AND PLAN: Palpitations Symptoms improved since our last visit. Holter monitor showed PACs and PVCs as well as a single brief episode of PSVT. As Ms. Bari is not particularly symptomatic, we have agreed to defer treatment at this time.  Fatigue This is likely multifactorial. She does not have other symptoms of coronary insufficiency, though given her age and history of hypertension and hyperlipidemia, CAD is certainly a  possibility. I do not believe that grade 1 diastolic dysfunction and mild aortic regurgitation alone explain her fatigue. We discussed the risks and benefits of ischemia evaluation and have agreed to defer stress testing at this time, as the patient would like to discuss her symptoms further with Dr. Alroy Dust when she sees him next month. I advised her to speak with Dr. Alroy Dust about checking her hemoglobin in light of her fatigue and mild anemia evident on most recent labs in our system from 12/2014.  Hypertension Blood pressure is improved but still mildly elevated today. As she is tolerating HCTZ 12.5 mg well, we will increase the dose to 25 mg daily. I will check a BMP in 3-4 weeks to monitor her renal function and electrolytes.  Follow-up: Return to clinic in 3 months  Nelva Bush, MD 05/18/2016 4:07 PM

## 2016-06-04 ENCOUNTER — Other Ambulatory Visit: Payer: Medicare Other

## 2016-07-14 ENCOUNTER — Ambulatory Visit (INDEPENDENT_AMBULATORY_CARE_PROVIDER_SITE_OTHER): Payer: Medicare Other | Admitting: Internal Medicine

## 2016-07-14 ENCOUNTER — Encounter: Payer: Self-pay | Admitting: Internal Medicine

## 2016-07-14 VITALS — BP 130/72 | HR 76 | Ht 64.0 in | Wt 164.0 lb

## 2016-07-14 DIAGNOSIS — K52839 Microscopic colitis, unspecified: Secondary | ICD-10-CM | POA: Diagnosis not present

## 2016-07-14 MED ORDER — BUDESONIDE 3 MG PO CPEP: 6.0000 mg | ORAL_CAPSULE | ORAL | 6 refills | Status: DC

## 2016-07-14 NOTE — Patient Instructions (Signed)
We have sent the following medications to your pharmacy for you to pick up at your convenience: Entocort  Please follow up in 6 months

## 2016-07-14 NOTE — Progress Notes (Signed)
HISTORY OF PRESENT ILLNESS:  Sharon Bailey is a 83 y.o. female with multiple medical problems as listed below who is followed in this office for collagenous colitis diagnosed in December 2016. She has required several courses of budesonide to control symptoms. Last evaluated January 2018. She was able to wean off budesonide by early May. She did well for almost 2 months then had a relapse in late June for which she resume budesonide 9 mg daily. Currently on 6 mg daily. Last problems in recent days. No new complaints. She has multiple questions regarding her condition. Review of laboratories from April 2018 reveal unremarkable basic metabolic panel.  REVIEW OF SYSTEMS:  All non-GI ROS negative entirely  Past Medical History:  Diagnosis Date  . Abnormal EKG   . Collagenous colitis   . Collagenous colitis   . Diverticulitis   . Diverticulosis   . Fluttering heart   . GERD (gastroesophageal reflux disease)   . Gout   . Gout attack 09/28/2012  . Hammer toe of left foot 09/28/2012  . Hyperlipidemia   . Hypertension   . Hypothyroidism   . Irregular heart beat   . Pain in joint, ankle and foot 09/28/2012  . Status post foot surgery 10/11/2012  . Thyroid disease     Past Surgical History:  Procedure Laterality Date  . ABDOMINAL HYSTERECTOMY    . ADENOIDECTOMY    . CHOLECYSTECTOMY    . Excision Ganglion Toe Left 09/29/2012   Lt #2 @ [facility]  . Hammer toe repair Left 09/29/2012   Lt #2 @ [facility]  . TONSILLECTOMY      Social History Sharon BlitzJanice L Bailey  reports that she has never smoked. She has never used smokeless tobacco. She reports that she does not drink alcohol or use drugs.  family history includes Breast cancer in her mother; Heart disease in her sister; Lung disease in her brother; Memory loss in her father; Rheumatic fever in her sister.  Allergies  Allergen Reactions  . Sulfamethoxazole-Trimethoprim Diarrhea and Nausea Only       PHYSICAL EXAMINATION: Vital signs: BP 130/72    Pulse 76   Ht 5\' 4"  (1.626 m)   Wt 164 lb (74.4 kg)   BMI 28.15 kg/m   Constitutional: generally well-appearing, no acute distress Psychiatric: alert and oriented x3, cooperative Eyes: extraocular movements intact, anicteric, conjunctiva pink Mouth: oral pharynx moist, no lesions Neck: supple no lymphadenopathy Cardiovascular: heart regular rate and rhythm, no murmur Lungs: clear to auscultation bilaterally Abdomen: soft, nontender, nondistended, no obvious ascites, no peritoneal signs, normal bowel sounds, no organomegaly Rectal:Omitted Extremities: no clubbing cyanosis or lower extremity edema bilaterally Skin: no lesions on visible extremities Neuro: No focal deficits. Cranial nerves intact  ASSESSMENT:  #1. Collagenous colitis. Responds well to budesonide though has had a relapse on several occasions after discontinuing. Currently stable on therapy   PLAN:  #1. We discussed again the treatment course with tapering an effort to come off medication. It may be that she does better on low-dose therapy, 3 mg for example. I reviewed tapering strategies with her. #2. Routine office follow-up 6 months. Contact the office in the interim for questions or problems  25 minutes spent face-to-face with the patient. Greater than 50% a time use for counseling regarding the pathophysiology of collagenous colitis, treatment strategies, and the approach to relapsing disease. Multiple excellent questions answered to her satisfaction

## 2016-08-21 ENCOUNTER — Encounter (INDEPENDENT_AMBULATORY_CARE_PROVIDER_SITE_OTHER): Payer: Self-pay

## 2016-08-21 ENCOUNTER — Ambulatory Visit (INDEPENDENT_AMBULATORY_CARE_PROVIDER_SITE_OTHER): Payer: Medicare Other | Admitting: Internal Medicine

## 2016-08-21 ENCOUNTER — Encounter: Payer: Self-pay | Admitting: Internal Medicine

## 2016-08-21 VITALS — BP 160/100 | HR 90 | Ht 64.0 in | Wt 161.8 lb

## 2016-08-21 DIAGNOSIS — R002 Palpitations: Secondary | ICD-10-CM | POA: Diagnosis not present

## 2016-08-21 DIAGNOSIS — I10 Essential (primary) hypertension: Secondary | ICD-10-CM

## 2016-08-21 DIAGNOSIS — R5383 Other fatigue: Secondary | ICD-10-CM

## 2016-08-21 DIAGNOSIS — R0789 Other chest pain: Secondary | ICD-10-CM | POA: Diagnosis not present

## 2016-08-21 MED ORDER — CARVEDILOL 6.25 MG PO TABS: 6.2500 mg | ORAL_TABLET | Freq: Two times a day (BID) | ORAL | 1 refills | Status: DC

## 2016-08-21 NOTE — Progress Notes (Signed)
Follow-up Outpatient Visit Date: 08/21/2016  Primary Care Provider: Alroy Dust, L.Marlou Sa, [place] [address] [place] [address]  Chief Complaint: Fatigue  HPI:  Sharon Bailey is a 83 y.o. year-old female with history of hypertension, hyperlipidemia, hypothyroidism, and gout, who presents for follow-up of fatigue. I last saw her in May, which time she continued to have palpitations and fatigue. I recommended that we consider noninvasive ischemia testing, but Sharon Bailey wished to discuss this with her PCP first.  Today, Sharon Bailey notes that she continues to have some days where she does not feel very well. This is not happening daily but is present at least several times a week. With modest activity, she feels a vague discomfort in her chest, though she states that it is not painful. She will sit down and rest for 15 or 20 minutes and notices resolution of the feeling. She has not had any shortness of breath. She notes occasional skipped beats but also her palpitations are better than at prior visits. She also notes rare orthostatic lightheadedness, though she also states that she sometimes feels as though her balance is off for the rest of the day. She denies orthopnea, PND, and edema.  --------------------------------------------------------------------------------------------------  Cardiovascular History & Procedures: Cardiovascular Problems:  Palpitations  Vague chest discomfort  Risk Factors:  Hypertension, hyperlipidemia, and age greater than 29  Cath/PCI:  None  CV Surgery:  None  EP Procedures and Devices:  48-hour Holter monitor (04/30/16): Predominantly sinus rhythm with rare PACs and single episode of PSVT (6 beats).  Non-Invasive Evaluation(s):  TTE (04/30/16): Normal LV size with mild LVH. LVEF 55-60% with normal wall motion and grade 1 diastolic dysfunction. Mild AI. MAC. Normal RV size and function. Normal PA pressure.  Recent CV Pertinent  Labs: Lab Results  Component Value Date   INR 2.38 (H) 07/05/2010   K 4.2 04/30/2016   BUN 26 04/30/2016   CREATININE 1.19 (H) 04/30/2016    Past medical and surgical history were reviewed and updated in EPIC.  Outpatient Encounter Prescriptions as of 08/21/2016  Medication Sig  . allopurinol (ZYLOPRIM) 300 MG tablet Take 300 mg by mouth daily.  Marland Kitchen aspirin 81 MG tablet Take 81 mg by mouth daily.  . budesonide (ENTOCORT EC) 3 MG 24 hr capsule Take 2 capsules (6 mg total) by mouth every morning.  . hydrochlorothiazide (HYDRODIURIL) 25 MG tablet Take 1 tablet (25 mg total) by mouth daily.  Marland Kitchen ibuprofen (ADVIL,MOTRIN) 200 MG tablet Take 200 mg by mouth every 6 (six) hours as needed (pain).   Marland Kitchen levothyroxine (SYNTHROID, LEVOTHROID) 100 MCG tablet Take 100 mcg by mouth daily before breakfast.   . lisinopril (PRINIVIL,ZESTRIL) 20 MG tablet Take 20 mg by mouth daily.  Marland Kitchen loratadine (CLARITIN) 10 MG tablet Take 10 mg by mouth daily as needed for allergies.   . Multiple Vitamins-Minerals (MULTI FOR HER 50+) TABS Take 1 tablet by mouth daily.   . simvastatin (ZOCOR) 40 MG tablet Take 40 mg by mouth daily.    No facility-administered encounter medications on file as of 08/21/2016.     Allergies: Sulfamethoxazole-trimethoprim  Social History   Social History  . Marital status: Married    Spouse name: N/A  . Number of children: N/A  . Years of education: N/A   Occupational History  . Not on file.   Social History Main Topics  . Smoking status: Never Smoker  . Smokeless tobacco: Never Used  . Alcohol use No  . Drug  use: No  . Sexual activity: Not on file   Other Topics Concern  . Not on file   Social History Narrative  . No narrative on file    Family History  Problem Relation Age of Onset  . Breast cancer Mother   . Memory loss Father   . Heart disease Sister   . Rheumatic fever Sister   . Lung disease Brother     Review of Systems: A 12-system review of systems was  performed and was negative except as noted in the HPI.  --------------------------------------------------------------------------------------------------  Physical Exam: BP (!) 168/100   Pulse 90   Ht 5' 4"  (1.626 m)   Wt 161 lb 12.8 oz (73.4 kg)   SpO2 98%   BMI 27.77 kg/m   General:  Well-developed, well-nourished elderly woman seated comfortably in the exam room. HEENT: No conjunctival pallor or scleral icterus.  Moist mucous membranes.  OP clear. Neck: Supple without lymphadenopathy, thyromegaly, JVD, or HJR.  Lungs: Normal work of breathing.  Clear to auscultation bilaterally without wheezes or crackles. Heart: Regular rate and rhythm without murmurs, rubs, or gallops.  Non-displaced PMI. Abd: Bowel sounds present.  Soft, NT/ND without hepatosplenomegaly Ext: No lower extremity edema.  Radial, PT, and DP pulses are 2+ bilaterally. Skin: Warm and dry without rash.  EKG: Normal sinus rhythm with left axis deviation and LVH by voltage criteria.   Lab Results  Component Value Date   WBC 11.1 (H) 12/17/2014   HGB 11.0 (L) 12/17/2014   HCT 34.2 (L) 12/17/2014   MCV 85.9 12/17/2014   PLT 243.0 12/17/2014    Lab Results  Component Value Date   NA 142 04/30/2016   K 4.2 04/30/2016   CL 101 04/30/2016   CO2 20 04/30/2016   BUN 26 04/30/2016   CREATININE 1.19 (H) 04/30/2016   GLUCOSE 83 04/30/2016   ALT 12 (L) 10/07/2014    No results found for: CHOL, HDL, LDLCALC, LDLDIRECT, TRIG, CHOLHDL  --------------------------------------------------------------------------------------------------  ASSESSMENT AND PLAN: Atypical chest pain and fatigue This has been a persistent problem for Sharon Bailey and is typically related with activity around the house. She does not have frank chest pain but describes a vague uneasiness or discomfort in the chest with activity. I am concerned that this may represent her anginal equivalent. We have therefore agreed to perform a pharmacologic  myocardial perfusion stress test. Sharon Bailey does not believe that she would be able to exercise adequately on a treadmill. I will start carvedilol 6.25 mg twice a day for antianginal therapy as well as blood pressure control. Sharon Bailey should continue low-dose aspirin and simvastatin.  Palpitations Symptoms have improved since her last visit. As above, we will start carvedilol.  Hypertension Blood pressure is poorly controlled today. We have discussed further treatment options and have agreed to start carvedilol 6.25 mg twice a day. Sharon Bailey should continue her current doses of lisinopril and hydrochlorothiazide.  Follow-up: Return to clinic in 1 month.  Nelva Bush, MD 08/21/2016 8:32 AM

## 2016-08-21 NOTE — Patient Instructions (Signed)
Medication Instructions:  Start coreg (carvedilol) 6.25mg  two times a day  Labwork: None   Testing/Procedures: Your physician has requested that you have a lexiscan myoview. For further information please visit [email or web address] Please follow instruction sheet, as given.    Follow-Up: Your physician recommends that you schedule a follow-up appointment in: 1 month with APP.          If you need a refill on your cardiac medications before your next appointment, please call your pharmacy.

## 2016-08-27 ENCOUNTER — Telehealth (HOSPITAL_COMMUNITY): Payer: Self-pay | Admitting: *Deleted

## 2016-08-27 NOTE — Telephone Encounter (Signed)
Patient given detailed instructions per Myocardial Perfusion Study Information Sheet for the test on  08/31/16. Patient notified to arrive 15 minutes early and that it is imperative to arrive on time for appointment to keep from having the test rescheduled.  If you need to cancel or reschedule your appointment, please call the office within 24 hours of your appointment. . Patient verbalized understanding. Sharon Bailey    

## 2016-08-31 ENCOUNTER — Ambulatory Visit (HOSPITAL_COMMUNITY): Payer: Medicare Other | Attending: Cardiology

## 2016-08-31 DIAGNOSIS — R079 Chest pain, unspecified: Secondary | ICD-10-CM | POA: Diagnosis present

## 2016-08-31 DIAGNOSIS — R002 Palpitations: Secondary | ICD-10-CM | POA: Insufficient documentation

## 2016-08-31 DIAGNOSIS — I2510 Atherosclerotic heart disease of native coronary artery without angina pectoris: Secondary | ICD-10-CM | POA: Insufficient documentation

## 2016-08-31 DIAGNOSIS — R0789 Other chest pain: Secondary | ICD-10-CM | POA: Diagnosis not present

## 2016-08-31 DIAGNOSIS — I10 Essential (primary) hypertension: Secondary | ICD-10-CM | POA: Insufficient documentation

## 2016-08-31 LAB — MYOCARDIAL PERFUSION IMAGING
CHL CUP RESTING HR STRESS: 57 {beats}/min
LHR: 0.33
LVDIAVOL: 57 mL (ref 46–106)
LVSYSVOL: 18 mL
NUC STRESS TID: 0.73
Peak HR: 96 {beats}/min
SDS: 4
SRS: 4
SSS: 8

## 2016-08-31 MED ORDER — REGADENOSON 0.4 MG/5ML IV SOLN
0.4000 mg | Freq: Once | INTRAVENOUS | Status: AC
  Administered 2016-08-31: 0.4 mg via INTRAVENOUS

## 2016-08-31 MED ORDER — TECHNETIUM TC 99M TETROFOSMIN IV KIT
10.7000 | PACK | Freq: Once | INTRAVENOUS | Status: AC | PRN
  Administered 2016-08-31: 10.7 via INTRAVENOUS
  Filled 2016-08-31: qty 11

## 2016-08-31 MED ORDER — TECHNETIUM TC 99M TETROFOSMIN IV KIT
32.8000 | PACK | Freq: Once | INTRAVENOUS | Status: AC | PRN
Start: 2016-08-31 — End: 2016-08-31
  Administered 2016-08-31: 32.8 via INTRAVENOUS
  Filled 2016-08-31: qty 33

## 2016-09-21 ENCOUNTER — Encounter: Payer: Self-pay | Admitting: Physician Assistant

## 2016-09-21 ENCOUNTER — Ambulatory Visit (INDEPENDENT_AMBULATORY_CARE_PROVIDER_SITE_OTHER): Payer: Medicare Other | Admitting: Physician Assistant

## 2016-09-21 VITALS — BP 150/60 | HR 86 | Ht 64.0 in | Wt 163.8 lb

## 2016-09-21 DIAGNOSIS — I10 Essential (primary) hypertension: Secondary | ICD-10-CM

## 2016-09-21 DIAGNOSIS — R0789 Other chest pain: Secondary | ICD-10-CM

## 2016-09-21 DIAGNOSIS — R002 Palpitations: Secondary | ICD-10-CM | POA: Diagnosis not present

## 2016-09-21 MED ORDER — CARVEDILOL 6.25 MG PO TABS: 9.3750 mg | ORAL_TABLET | Freq: Two times a day (BID) | ORAL | 11 refills | Status: DC

## 2016-09-21 NOTE — Patient Instructions (Addendum)
Medication Instructions:  Increase Coreg (Carvedilol) to 6.25 mg take 1 and 1/2 tablets (9.375 mg) Twice daily   Labwork: None   Testing/Procedures: None   Follow-Up: Dr. Cristal Deer End ON 12/17/16 @ 8:20    Any Other Special Instructions Will Be Listed Below (If Applicable). Try to increase activity (walking is the best thing you can do).  You can start off at 5 minutes a day and slowly increase your time from there.  If you need a refill on your cardiac medications before your next appointment, please call your pharmacy.

## 2016-09-21 NOTE — Progress Notes (Signed)
Cardiology Office Note:    Date:  09/21/2016   ID:  Sharon Bailey, DOB August 20, 1930, MRN [id number]  PCP:  Aurea Graff.Marlou Sa, MD  Cardiologist:  Dr. Nelva Bush    Referring MD: Alroy Dust, Carlean Jews.Marlou Sa, MD   Chief Complaint  Patient presents with  . Follow-up    HTN, palpitations, fatigue    History of Present Illness:    Sharon Bailey is a 83 y.o. female with a hx of HTN, HL, hypothyroidism, gout.  She has been evaluated by Dr. Harrell Gave End for palpitations, atypical chest pain and fatigue.  Last seen 08/21/16.  Stress testing was arranged. This demonstrated inferolateral infarct or ischemia. Study was felt to be low risk.  Sharon Bailey returns for follow-up. She is here alone. She continues to note occasional palpitations. She continues to note fatigue. She denies significant shortness of breath. She denies chest pain. She denies PND or edema. She denies syncope.  Prior CV studies:   The following studies were reviewed today:  Nuclear stress test 08/31/16 Normal pharmacologic nuclear stress test with no evidence for prior infarct or ischemia. Low risk. EF 69  48 hour Holter 04/30/16 NSR, rare PACs, single episode of PSVT  Echocardiogram 04/30/16 Mild concentric LVH, EF 55-60, normal wall motion, grade 1 diastolic dysfunction, MAC   Past Medical History:  Diagnosis Date  . Abnormal EKG   . Collagenous colitis   . Collagenous colitis   . Diverticulitis   . Diverticulosis   . Fluttering heart   . GERD (gastroesophageal reflux disease)   . Gout   . Gout attack 09/28/2012  . Hammer toe of left foot 09/28/2012  . Hyperlipidemia   . Hypertension   . Hypothyroidism   . Irregular heart beat   . Pain in joint, ankle and foot 09/28/2012  . Status post foot surgery 10/11/2012  . Thyroid disease     Past Surgical History:  Procedure Laterality Date  . ABDOMINAL HYSTERECTOMY    . ADENOIDECTOMY    . CHOLECYSTECTOMY    . Excision Ganglion Toe Left 09/29/2012   Lt #2 @ [place]  .  Hammer toe repair Left 09/29/2012   Lt #2 @ [facility]  . TONSILLECTOMY      Current Medications: Current Meds  Medication Sig  . allopurinol (ZYLOPRIM) 300 MG tablet Take 300 mg by mouth daily.  Marland Kitchen aspirin 81 MG tablet Take 81 mg by mouth daily.  . budesonide (ENTOCORT EC) 3 MG 24 hr capsule Take 2 capsules (6 mg total) by mouth every morning.  . hydrochlorothiazide (HYDRODIURIL) 25 MG tablet Take 1 tablet (25 mg total) by mouth daily.  Marland Kitchen ibuprofen (ADVIL,MOTRIN) 200 MG tablet Take 200 mg by mouth every 6 (six) hours as needed (pain).   Marland Kitchen levothyroxine (SYNTHROID, LEVOTHROID) 100 MCG tablet Take 100 mcg by mouth daily before breakfast.   . lisinopril (PRINIVIL,ZESTRIL) 20 MG tablet Take 20 mg by mouth daily.  Marland Kitchen loratadine (CLARITIN) 10 MG tablet Take 10 mg by mouth daily as needed for allergies.   . Multiple Vitamins-Minerals (MULTI FOR HER 50+) TABS Take 1 tablet by mouth daily.   . simvastatin (ZOCOR) 40 MG tablet Take 40 mg by mouth daily.   . [DISCONTINUED] carvedilol (COREG) 6.25 MG tablet Take 1 tablet (6.25 mg total) by mouth 2 (two) times daily.     Allergies:   Sulfamethoxazole-trimethoprim   Social History   Social History  . Marital status: Married    Spouse name: N/A  . Number of children:  N/A  . Years of education: N/A   Social History Main Topics  . Smoking status: Never Smoker  . Smokeless tobacco: Never Used  . Alcohol use No  . Drug use: No  . Sexual activity: Not Asked   Other Topics Concern  . None   Social History Narrative  . None     Family Hx: The patient's family history includes Breast cancer in her mother; Heart disease in her sister; Lung disease in her brother; Memory loss in her father; Rheumatic fever in her sister.  ROS:   Please see the history of present illness.    Review of Systems  Hematologic/Lymphatic: Bruises/bleeds easily.   All other systems reviewed and are negative.   EKGs/Labs/Other Test Reviewed:    EKG:  EKG is not ordered  today.  The ekg ordered today demonstrates n/a  Recent Labs: 04/30/2016: BUN 26; Creatinine, Ser 1.19; Potassium 4.2; Sodium 142   Recent Lipid Panel No results found for: CHOL, TRIG, HDL, CHOLHDL, LDLCALC, LDLDIRECT  Physical Exam:    VS:  BP (!) 150/60   Pulse 86   Ht 5' 4"  (1.626 m)   Wt 163 lb 12.8 oz (74.3 kg)   BMI 28.12 kg/m     Wt Readings from Last 3 Encounters:  09/21/16 163 lb 12.8 oz (74.3 kg)  08/21/16 161 lb 12.8 oz (73.4 kg)  07/14/16 164 lb (74.4 kg)     Physical Exam  Constitutional: She is oriented to person, place, and time. She appears well-developed and well-nourished. No distress.  HENT:  Head: Normocephalic and atraumatic.  Eyes: No scleral icterus.  Neck: No JVD present.  Cardiovascular: Normal rate and regular rhythm.   No murmur heard. Pulmonary/Chest: Effort normal. She has no rales.  Abdominal: Soft. There is no tenderness.  Musculoskeletal: She exhibits edema (trace bilat LE edema).  Neurological: She is alert and oriented to person, place, and time.  Skin: Skin is warm and dry.  Psychiatric: She has a normal mood and affect.    ASSESSMENT:    1. Essential hypertension   2. Palpitations   3. Atypical chest pain    PLAN:    In order of problems listed above:  1. Essential hypertension Blood pressure is improved but could be better. Continue current dose of hydrochlorothiazide and lisinopril. I will increase her Carvedilol further to 9.375 mg twice a day.  2. Palpitations Holter monitor in April demonstrated PACs and a single brief episode of PSVT. Overall, her symptoms are stable. Adjust beta blocker therapy as noted for blood pressure control.  3. Atypical chest pain Recent nuclear stress test low risk without evidence of ischemia. She denies recurrent chest symptoms. I have encouraged her to increase her activity. Hopefully this will help with her fatigue. No further cardiac testing warranted at this time.  Dispo:  Return in about  3 months (around 12/21/2016) for Routine Follow Up, w/ Dr. Saunders Revel.   Medication Adjustments/Labs and Tests Ordered: Current medicines are reviewed at length with the patient today.  Concerns regarding medicines are outlined above.  Tests Ordered: No orders of the defined types were placed in this encounter.  Medication Changes: Meds ordered this encounter  Medications  . carvedilol (COREG) 6.25 MG tablet    Sig: Take 1.5 tablets (9.375 mg total) by mouth 2 (two) times daily.    Dispense:  90 tablet    Refill:  11    Order Specific Question:   Supervising Provider    Answer:  Fransico Him R [[id number]]    Signed, Richardson Dopp, PA-C  09/21/2016 3:47 PM    [facility] [address], [place], [place]  [address] Phone: [phone]; Fax: [phone]

## 2016-10-18 ENCOUNTER — Other Ambulatory Visit: Payer: Self-pay | Admitting: Internal Medicine

## 2016-10-18 DIAGNOSIS — I10 Essential (primary) hypertension: Secondary | ICD-10-CM

## 2016-10-18 DIAGNOSIS — R002 Palpitations: Secondary | ICD-10-CM

## 2016-12-17 ENCOUNTER — Ambulatory Visit: Payer: Medicare Other | Admitting: Internal Medicine

## 2017-01-20 ENCOUNTER — Encounter: Payer: Self-pay | Admitting: Internal Medicine

## 2017-01-20 ENCOUNTER — Ambulatory Visit: Payer: Medicare Other | Admitting: Internal Medicine

## 2017-01-20 VITALS — BP 120/68 | HR 66 | Ht 64.0 in | Wt 161.1 lb

## 2017-01-20 DIAGNOSIS — K52839 Microscopic colitis, unspecified: Secondary | ICD-10-CM | POA: Diagnosis not present

## 2017-01-20 MED ORDER — BUDESONIDE 3 MG PO CPEP: 6.0000 mg | ORAL_CAPSULE | ORAL | 3 refills | Status: DC

## 2017-01-20 NOTE — Progress Notes (Signed)
HISTORY OF PRESENT ILLNESS:  Sharon Bailey is a 84 y.o. female diagnosed with collagenous colitis in December 2016. She presents today for routine follow-up. Last evaluated July 2018. At that time she attempted tapering her budesonide. Once she had been on 3 mg daily for 2 weeks she had relapse in symptoms. At the time of her last office evaluation she was on 6 mg daily and doing well. I encouraged her to continue on 6 mg daily for a while and then reattempt tapering. She was apprehensive and did not. Thus, she has continued on 6 mg daily reports doing well with normal bowel movements. No appreciable side effects from her medication. She does request medication refill. No new GI complaints and her interval health has been stable.  REVIEW OF SYSTEMS:  All non-GI ROS negative except for back pain, arthritis  Past Medical History:  Diagnosis Date  . Abnormal EKG   . Collagenous colitis   . Collagenous colitis   . Diverticulitis   . Diverticulosis   . Fluttering heart   . GERD (gastroesophageal reflux disease)   . Gout   . Gout attack 09/28/2012  . Hammer toe of left foot 09/28/2012  . Hyperlipidemia   . Hypertension   . Hypothyroidism   . Irregular heart beat   . Pain in joint, ankle and foot 09/28/2012  . Status post foot surgery 10/11/2012  . Thyroid disease     Past Surgical History:  Procedure Laterality Date  . ABDOMINAL HYSTERECTOMY    . ADENOIDECTOMY    . CHOLECYSTECTOMY    . Excision Ganglion Toe Left 09/29/2012   Lt #2 @ [facility]  . Hammer toe repair Left 09/29/2012   Lt #2 @ [facility]  . TONSILLECTOMY      Social History Sharon Bailey  reports that  has never smoked. she has never used smokeless tobacco. She reports that she does not drink alcohol or use drugs.  family history includes Breast cancer in her mother; Heart disease in her sister; Lung disease in her brother; Memory loss in her father; Rheumatic fever in her sister.  Allergies  Allergen Reactions  .  Sulfamethoxazole-Trimethoprim Diarrhea and Nausea Only       PHYSICAL EXAMINATION: Vital signs: BP 120/68   Pulse 66   Ht 5\' 4"  (1.626 m)   Wt 161 lb 2 oz (73.1 kg)   BMI 27.66 kg/m   Constitutional: generally well-appearing, no acute distress Psychiatric: alert and oriented x3, cooperative Eyes: extraocular movements intact, anicteric, conjunctiva pink Mouth: oral pharynx moist, no lesions Neck: supple no lymphadenopathy Cardiovascular: heart regular rate and rhythm, no murmur Lungs: clear to auscultation bilaterally Abdomen: soft, nontender, nondistended, no obvious ascites, no peritoneal signs, normal bowel sounds, no organomegaly Rectal: Omitted Extremities: no clubbing, cyanosis, or lower extremity edema bilaterally Skin: no lesions on visible extremities Neuro: No focal deficits.   ASSESSMENT:  #1. Collagenous colitis. Asymptomatic on budesonide 6 mg daily   PLAN:  #1. Medication refilled #2. Encouraged to reattempt tapering to lowest dose to control symptoms, off medication if possible #3. Routine GI follow-up one year. Sooner if needed  15 minutes spent face-to-face with the patient. Greater than 50% a time use for counseling regarding ongoing management of her collagenous colitis and directions regarding medication management

## 2017-01-20 NOTE — Patient Instructions (Signed)
We have sent the following medications to your pharmacy for you to pick up at your convenience:  Budesonide  

## 2017-02-05 ENCOUNTER — Encounter: Payer: Self-pay | Admitting: Internal Medicine

## 2017-02-05 ENCOUNTER — Ambulatory Visit: Payer: Medicare Other | Admitting: Internal Medicine

## 2017-02-05 VITALS — BP 112/60 | HR 86 | Ht 64.0 in | Wt 150.0 lb

## 2017-02-05 DIAGNOSIS — R0789 Other chest pain: Secondary | ICD-10-CM | POA: Diagnosis not present

## 2017-02-05 DIAGNOSIS — R5382 Chronic fatigue, unspecified: Secondary | ICD-10-CM | POA: Diagnosis not present

## 2017-02-05 DIAGNOSIS — E785 Hyperlipidemia, unspecified: Secondary | ICD-10-CM | POA: Insufficient documentation

## 2017-02-05 DIAGNOSIS — I10 Essential (primary) hypertension: Secondary | ICD-10-CM | POA: Diagnosis not present

## 2017-02-05 NOTE — Patient Instructions (Addendum)
Medication Instructions:   STOP Hydrochlorothiazide (HCTZ)  -- If you need a refill on your cardiac medications before your next appointment, please call your pharmacy. --  Labwork: None ordered  Testing/Procedures:  Hypertension Clinic within 1 month  Follow-Up: Your physician wants you to follow-up in: 3 months with Dr. Okey DupreEnd.     Thank you for choosing [facility]!!    Any Other Special Instructions Will Be Listed Below (If Applicable).

## 2017-02-05 NOTE — Progress Notes (Signed)
Follow-up Outpatient Visit Date: 02/05/2017  Primary Care Provider: Alroy Dust, L.Marlou Sa, [place] [employer] [place] [address]  Chief Complaint: Fatigue  HPI:  Sharon Bailey is a 84 y.o. year-old female with history of hypertension, hyperlipidemia, hypothyroidism, and gout, who presents for follow-up of fatigue. I last saw her in August, 2018, at which time she complained of fatigue and atypical chest pain. Subsequent stress test was low risk without ischemia or scar. She denied further chest pain when she followed up with Richardson Dopp, PA, in September. She was encouraged to increase her activity.  Today, Sharon Bailey reports that she continues to get fatigue quite easily.  She is exhausted just walking to her mailbox.  She does not report significant shortness of breath but just generalized sense of tiredness.  She denies chest pain but continues to have rare palpitations.  She denies lightheadedness, orthopnea, PND, and edema.  She is concerned about having to get up frequently at night to urinate as well as frequent urination during the day.  She wonders if changing her medications could help with this.  --------------------------------------------------------------------------------------------------  Cardiovascular History & Procedures: Cardiovascular Problems:  Palpitations  Fatigue  Risk Factors:  Hypertension, hyperlipidemia, and age greater than 74  Cath/PCI:  None  CV Surgery:  None  EP Procedures and Devices:  48-hour Holter monitor (04/30/16): Predominantly sinus rhythm with rare PACs and single episode of PSVT (6 beats).  Non-Invasive Evaluation(s):  Pharmacologic MPI (08/31/16): Normal study without ischemia or scar. LVEF > 65%.  TTE (04/30/16): Normal LV size with mild LVH. LVEF 55-60% with normal wall motion and grade 1 diastolic dysfunction. Mild AI. MAC. Normal RV size and function. Normal PA pressure.   Recent CV Pertinent Labs: Lab Results   Component Value Date   INR 2.38 (H) 07/05/2010   K 4.2 04/30/2016   BUN 26 04/30/2016   CREATININE 1.19 (H) 04/30/2016    Past medical and surgical history were reviewed and updated in EPIC.  Current Meds  Medication Sig  . allopurinol (ZYLOPRIM) 300 MG tablet Take 300 mg by mouth daily.  Marland Kitchen aspirin 81 MG tablet Take 81 mg by mouth daily.  . budesonide (ENTOCORT EC) 3 MG 24 hr capsule Take 2 capsules (6 mg total) by mouth every morning.  . carvedilol (COREG) 6.25 MG tablet Take 1.5 tablets (9.375 mg total) by mouth 2 (two) times daily.  . hydrochlorothiazide (HYDRODIURIL) 25 MG tablet TAKE 1/2 TABLET (12.5MG) BY MOUTH DAILY  . ibuprofen (ADVIL,MOTRIN) 200 MG tablet Take 200 mg by mouth every 6 (six) hours as needed (pain).   Marland Kitchen levothyroxine (SYNTHROID, LEVOTHROID) 100 MCG tablet Take 100 mcg by mouth daily before breakfast.   . lisinopril (PRINIVIL,ZESTRIL) 20 MG tablet Take 20 mg by mouth daily.  Marland Kitchen loratadine (CLARITIN) 10 MG tablet Take 10 mg by mouth daily as needed for allergies.   . Multiple Vitamins-Minerals (MULTI FOR HER 50+) TABS Take 1 tablet by mouth daily.   . simvastatin (ZOCOR) 40 MG tablet Take 40 mg by mouth daily.     Allergies: Sulfamethoxazole-trimethoprim  Social History   Socioeconomic History  . Marital status: Married    Spouse name: Not on file  . Number of children: Not on file  . Years of education: Not on file  . Highest education level: Not on file  Social Needs  . Financial resource strain: Not on file  . Food insecurity - worry: Not on file  . Food insecurity - inability: Not  on file  . Transportation needs - medical: Not on file  . Transportation needs - non-medical: Not on file  Occupational History  . Not on file  Tobacco Use  . Smoking status: Never Smoker  . Smokeless tobacco: Never Used  Substance and Sexual Activity  . Alcohol use: No    Alcohol/week: 0.0 oz  . Drug use: No  . Sexual activity: Not on file  Other Topics Concern    . Not on file  Social History Narrative  . Not on file    Family History  Problem Relation Age of Onset  . Breast cancer Mother   . Memory loss Father   . Heart disease Sister   . Rheumatic fever Sister   . Lung disease Brother     Review of Systems: A 12-system review of systems was performed and was negative except as noted in the HPI.  --------------------------------------------------------------------------------------------------  Physical Exam: BP 112/60   Pulse 86   Ht _0  (1.626 m)   Wt 150 lb (68 kg)   SpO2 95%   BMI 25.75 kg/m   General: Well-developed, well-nourished elderly woman, seated comfortably in the exam room. HEENT: No conjunctival pallor or scleral icterus. Moist mucous membranes.  OP clear. Neck: Supple without lymphadenopathy, thyromegaly, JVD, or HJR.. Lungs: Normal work of breathing. Clear to auscultation bilaterally without wheezes or crackles. Heart: Regular rate and rhythm without murmurs, rubs, or gallops. Non-displaced PMI. Abd: Bowel sounds present. Soft, NT/ND without hepatosplenomegaly Ext: No lower extremity edema. Radial, PT, and DP pulses are 2+ bilaterally. Skin: Warm and dry without rash.   Lab Results  Component Value Date   WBC 11.1 (H) 12/17/2014   HGB 11.0 (L) 12/17/2014   HCT 34.2 (L) 12/17/2014   MCV 85.9 12/17/2014   PLT 243.0 12/17/2014    Lab Results  Component Value Date   NA 142 04/30/2016   K 4.2 04/30/2016   CL 101 04/30/2016   CO2 20 04/30/2016   BUN 26 04/30/2016   CREATININE 1.19 (H) 04/30/2016   GLUCOSE 83 04/30/2016   ALT 12 (L) 10/07/2014    No results found for: CHOL, HDL, LDLCALC, LDLDIRECT, TRIG, CHOLHDL  --------------------------------------------------------------------------------------------------  ASSESSMENT AND PLAN: Fatigue This has been a long-standing problem for Sharon Bailey.  I suspect it is multifactorial including age and deconditioning.  Beta-blockade could also be  contributing, though heart rate in the 80s today suggests that she is not excessively beta blocked.  We discussed de-escalation of carvedilol, though [name] would like to defer this in favor of holding HCTZ.  Atypical chest pain No recurrence since our last visit. Myocardial perfusion stress test in 08/2016 was normal. No further workup or intervention at this time.  Hypertension Blood pressure is normal today.  Given frequent urination, we have agreed to discontinue HCTZ.  We will continue current doses of carvedilol and lisinopril.  I have asked her to contact us if her blood pressure is consistently above 140/90.  Hyperlipidemia Continue simvastatin 40 mg daily.  Defer follow-up and further adjustment to Ms. Derk's PCP.  Follow-up: Return to clinic in 3 months.  Nelva Bush, MD 02/05/2017 9:50 AM

## 2017-02-15 ENCOUNTER — Encounter: Payer: Medicare Other | Admitting: Internal Medicine

## 2017-03-09 ENCOUNTER — Ambulatory Visit (INDEPENDENT_AMBULATORY_CARE_PROVIDER_SITE_OTHER): Payer: Medicare Other | Admitting: Pharmacist

## 2017-03-09 ENCOUNTER — Encounter (INDEPENDENT_AMBULATORY_CARE_PROVIDER_SITE_OTHER): Payer: Self-pay

## 2017-03-09 VITALS — BP 155/95 | HR 65

## 2017-03-09 DIAGNOSIS — I10 Essential (primary) hypertension: Secondary | ICD-10-CM | POA: Diagnosis not present

## 2017-03-09 MED ORDER — LISINOPRIL 40 MG PO TABS: 40.0000 mg | ORAL_TABLET | Freq: Every day | ORAL | 3 refills | Status: DC

## 2017-03-09 NOTE — Progress Notes (Signed)
Patient ID: Sharon Bailey                 DOB: 06-10-1930                      MRN: [id number]     HPI: Sharon Bailey is a 84 y.o. female referred by Dr. Okey DupreEnd to HTN clinic. PMH is significant for HTN, HLD, hypothyroidism, and gout. At her last visit 1 month ago, BP was controlled but HCTZ was stopped due to frequent urination.  Patient presents today in good spirits. She is still fatigued but reports that she has not been urinating as frequently. She denies dizziness, blurred vision, falls, or headache. She has a home wrist BP cuff and checks readings occasionally. Did check this morning - BP was 170 systolic but improved to 120s after relaxing. Other readings mostly 140s systolic. She is stressed today due to difficulty finding parking. She took her morning medications 3 hours ago. She only uses her ibuprofen 1-2x per week.  BP in clinic 192/94, on recheck 190/100 10 minutes later, checked in each arm. No headache, vision changes, or s/sx of stroke. Pt given clonidine 0.2mg . BP 15 minutes later improved to 155/95.  Current HTN meds: carvedilol 9.375mg  BID, lisinopril 20mg  daily (AM) Previously tried: HCTZ - frequent urination BP goal: <140/5990mmHg  Family History: Sister with heat disease.  Social History: Denies tobacco, alcohol, and illicit drug use.  Diet: Breakfast - cereal and coffee. Lunch - sandwich or leftovers. Dinner - chicken, ground beef, or fish. Snacks on fruit. Does add salt to food when cooking.  Exercise: Minimal, fatigues with walking and has back pain. Does stay busy with housework.  Wt Readings from Last 3 Encounters:  02/05/17 150 lb (68 kg)  01/20/17 161 lb 2 oz (73.1 kg)  09/21/16 163 lb 12.8 oz (74.3 kg)   BP Readings from Last 3 Encounters:  02/05/17 112/60  01/20/17 120/68  09/21/16 (!) 150/60   Pulse Readings from Last 3 Encounters:  02/05/17 86  01/20/17 66  09/21/16 86    Renal function: CrCl cannot be calculated (Patient's most recent lab result  is older than the maximum 21 days allowed.).  Past Medical History:  Diagnosis Date  . Abnormal EKG   . Collagenous colitis   . Collagenous colitis   . Diverticulitis   . Diverticulosis   . Fluttering heart   . GERD (gastroesophageal reflux disease)   . Gout   . Gout attack 09/28/2012  . Hammer toe of left foot 09/28/2012  . Hyperlipidemia   . Hypertension   . Hypothyroidism   . Irregular heart beat   . Pain in joint, ankle and foot 09/28/2012  . Status post foot surgery 10/11/2012  . Thyroid disease     Current Outpatient Medications on File Prior to Visit  Medication Sig Dispense Refill  . allopurinol (ZYLOPRIM) 300 MG tablet Take 300 mg by mouth daily.    Marland Kitchen. aspirin 81 MG tablet Take 81 mg by mouth daily.    . budesonide (ENTOCORT EC) 3 MG 24 hr capsule Take 2 capsules (6 mg total) by mouth every morning. 180 capsule 3  . carvedilol (COREG) 6.25 MG tablet Take 1.5 tablets (9.375 mg total) by mouth 2 (two) times daily. 90 tablet 11  . ibuprofen (ADVIL,MOTRIN) 200 MG tablet Take 200 mg by mouth every 6 (six) hours as needed (pain).     Marland Kitchen. levothyroxine (SYNTHROID, LEVOTHROID) 100 MCG tablet Take 100  mcg by mouth daily before breakfast.     . lisinopril (PRINIVIL,ZESTRIL) 20 MG tablet Take 20 mg by mouth daily.    Marland Kitchen loratadine (CLARITIN) 10 MG tablet Take 10 mg by mouth daily as needed for allergies.     . Multiple Vitamins-Minerals (MULTI FOR HER 50+) TABS Take 1 tablet by mouth daily.     . simvastatin (ZOCOR) 40 MG tablet Take 40 mg by mouth daily.      No current facility-administered medications on file prior to visit.     Allergies  Allergen Reactions  . Sulfamethoxazole-Trimethoprim Diarrhea and Nausea Only     Assessment/Plan:  1. Hypertension - BP abnormally high today since discontinuing HCTZ 12.5mg  daily. BP increased from 112/60 to 192/96. Pt given clonidine 0.2mg  in clinic and BP improved to 155/95. Counseled on s/sx of stroke, pt asymptomatic today and denies  vision changes or headache. Will increase lisinopril to 40mg  daily and recheck BP and BMET in 2 weeks. Pt advised to check BP at home and call clinic if readings remain > 170 systolic.   Briget Shaheed E. Lun Muro, PharmD, CPP, BCACP [facility] [address] [place], [place] [address] Phone: [phone]; Fax: [phone] 03/09/2017 11:24 AM

## 2017-03-09 NOTE — Patient Instructions (Signed)
It was nice to meet you today  Please increase your lisinopril to 40mg  daily  Continue taking your other medications  Check your blood pressure at home and record your readings  Follow up in clinic in 2 weeks for blood pressure check and lab work

## 2017-03-12 ENCOUNTER — Telehealth: Payer: Self-pay | Admitting: Pharmacist

## 2017-03-12 MED ORDER — SPIRONOLACTONE 25 MG PO TABS: 12.5000 mg | ORAL_TABLET | Freq: Every day | ORAL | 3 refills | Status: DC

## 2017-03-12 NOTE — Telephone Encounter (Signed)
Pt called to report that BP is very elevated  (180-190 systolic) and she needs something called in to help. Attempted to titrate her carvedilol but she states that will not help her blood pressure and she would like a new medication sent in today. In addition she is unsure what her HR has been running. Will send for spironolactone 12.5mg  daily. She wants to go off the lisinopril. Advised that she continue all her medications and follow up as scheduled for repeat BMET and BP check.

## 2017-03-24 ENCOUNTER — Ambulatory Visit (INDEPENDENT_AMBULATORY_CARE_PROVIDER_SITE_OTHER): Payer: Medicare Other | Admitting: Pharmacist

## 2017-03-24 ENCOUNTER — Other Ambulatory Visit: Payer: Medicare Other | Admitting: *Deleted

## 2017-03-24 VITALS — BP 175/102 | HR 90

## 2017-03-24 DIAGNOSIS — I10 Essential (primary) hypertension: Secondary | ICD-10-CM

## 2017-03-24 LAB — BASIC METABOLIC PANEL
BUN / CREAT RATIO: 19 (ref 12–28)
BUN: 24 mg/dL (ref 8–27)
CO2: 19 mmol/L — ABNORMAL LOW (ref 20–29)
CREATININE: 1.29 mg/dL — AB (ref 0.57–1.00)
Calcium: 9.8 mg/dL (ref 8.7–10.3)
Chloride: 107 mmol/L — ABNORMAL HIGH (ref 96–106)
GFR calc Af Amer: 44 mL/min/{1.73_m2} — ABNORMAL LOW (ref >59)
GFR, EST NON AFRICAN AMERICAN: 38 mL/min/{1.73_m2} — AB (ref >59)
GLUCOSE: 89 mg/dL (ref 65–99)
Potassium: 4.6 mmol/L (ref 3.5–5.2)
Sodium: 141 mmol/L (ref 134–144)

## 2017-03-24 NOTE — Patient Instructions (Addendum)
Look for Sharon Sharilyn SitesDash seasoning to use instead of salt  Check your heart rate at home and record this along with your blood pressure readings  If your lab work from today is stable, we will increase your spironolactone from 12.5mg  to 25mg  (1 tablet) each day  Follow up in clinic in 3-4 weeks for blood pressure check

## 2017-03-24 NOTE — Progress Notes (Signed)
Patient ID: Sharon Bailey                 DOB: 19-Jan-1930                      MRN: [id number]     HPI: Sharon Bailey is a 84 y.o. female referred by Dr. Okey DupreEnd to HTN clinic. PMH is significant for HTN, HLD, hypothyroidism, and gout. At her last visit 1 month ago, BP was controlled but HCTZ 12.5mg  was stopped due to frequent urination. At follow up, BP increased abnormally from 112/60 to 192/96. Pt was given clonidine in clinic and lisinopril was increased to 40mg  daily. 3 days later, pt called clinic with complaints of elevated BP 180-190 systolic and she was also started on spironolactone 12.5mg  daily.  Patient presents today in good spirits. She reports tolerating her higher dose of lisinopril and her new spironolactone well. She denies dizziness, blurred vision, falls, or headaches. She is notably anxious in clinic - she fidgets through most of the visit and HR is 100 (typically in the 60s on her carvedilol). She reports waking up last night and then not being able to fall back asleep because she was thinking about today's visit. She has been checking her BP 5-6x per day. Systolic BP readings range 130-140 mostly. She took her medication 3 hours ago and has had 1 cup of coffee. She only uses her ibuprofen 1-2x per week.  Current HTN meds: carvedilol 9.375mg  BID, lisinopril 40mg  daily (AM), spironolactone 12.5mg  daily (AM) Previously tried: HCTZ - frequent urination BP goal: <140/6990mmHg  Family History: Sister with heat disease.  Social History: Denies tobacco, alcohol, and illicit drug use.  Diet: Breakfast - cereal and coffee. Lunch - sandwich or leftovers. Dinner - chicken, ground beef, or fish. Snacks on fruit. Does add salt to food when cooking.  Exercise: Minimal, fatigues with walking and has back pain. Does stay busy with housework.  Wt Readings from Last 3 Encounters:  02/05/17 150 lb (68 kg)  01/20/17 161 lb 2 oz (73.1 kg)  09/21/16 163 lb 12.8 oz (74.3 kg)   BP Readings from  Last 3 Encounters:  03/09/17 (!) 155/95  02/05/17 112/60  01/20/17 120/68   Pulse Readings from Last 3 Encounters:  03/09/17 65  02/05/17 86  01/20/17 66    Renal function: CrCl cannot be calculated (Patient's most recent lab result is older than the maximum 21 days allowed.).  Past Medical History:  Diagnosis Date  . Abnormal EKG   . Collagenous colitis   . Collagenous colitis   . Diverticulitis   . Diverticulosis   . Fluttering heart   . GERD (gastroesophageal reflux disease)   . Gout   . Gout attack 09/28/2012  . Hammer toe of left foot 09/28/2012  . Hyperlipidemia   . Hypertension   . Hypothyroidism   . Irregular heart beat   . Pain in joint, ankle and foot 09/28/2012  . Status post foot surgery 10/11/2012  . Thyroid disease     Current Outpatient Medications on File Prior to Visit  Medication Sig Dispense Refill  . allopurinol (ZYLOPRIM) 300 MG tablet Take 300 mg by mouth daily.    Marland Kitchen. aspirin 81 MG tablet Take 81 mg by mouth daily.    . budesonide (ENTOCORT EC) 3 MG 24 hr capsule Take 2 capsules (6 mg total) by mouth every morning. 180 capsule 3  . carvedilol (COREG) 6.25 MG tablet Take 1.5 tablets (9.375  mg total) by mouth 2 (two) times daily. 90 tablet 11  . ibuprofen (ADVIL,MOTRIN) 200 MG tablet Take 200 mg by mouth every 6 (six) hours as needed (pain).     Marland Kitchen levothyroxine (SYNTHROID, LEVOTHROID) 100 MCG tablet Take 100 mcg by mouth daily before breakfast.     . lisinopril (PRINIVIL,ZESTRIL) 40 MG tablet Take 1 tablet (40 mg total) by mouth daily. 90 tablet 3  . loratadine (CLARITIN) 10 MG tablet Take 10 mg by mouth daily as needed for allergies.     . Multiple Vitamins-Minerals (MULTI FOR HER 50+) TABS Take 1 tablet by mouth daily.     . simvastatin (ZOCOR) 40 MG tablet Take 40 mg by mouth daily.     Marland Kitchen spironolactone (ALDACTONE) 25 MG tablet Take 0.5 tablets (12.5 mg total) by mouth daily. 90 tablet 3   No current facility-administered medications on file prior to  visit.     Allergies  Allergen Reactions  . Sulfamethoxazole-Trimethoprim Diarrhea and Nausea Only     Assessment/Plan:  1. Hypertension - BP remains abnormally high today due to patient's anxiety of having her BP checked in clinic. Home BP readings have been improved with systolic readings mostly 130-140. Will check BMET today and if stable, plan to increase spironolactone to 25mg  daily and continue lisinopril 40mg  daily and carvedilol 9.375mg  BID. Encouraged pt to switch to Mrs Sharilyn Sites salt substitute and to record her home BP readings and HR and bring readings to follow up visit. F/u in HTN clinic in 3 weeks.   Megan E. Supple, PharmD, CPP, BCACP [facility] [address], [place], [place] [address] Phone: [phone]; Fax: [phone] 03/24/2017 9:01 AM  Addendum: BMET stable, pt aware to increase spironolactone to 25mg  daily and keep follow up in clinic.

## 2017-04-15 ENCOUNTER — Ambulatory Visit (INDEPENDENT_AMBULATORY_CARE_PROVIDER_SITE_OTHER): Payer: Medicare Other | Admitting: Pharmacist

## 2017-04-15 ENCOUNTER — Encounter: Payer: Self-pay | Admitting: Pharmacist

## 2017-04-15 ENCOUNTER — Encounter (INDEPENDENT_AMBULATORY_CARE_PROVIDER_SITE_OTHER): Payer: Self-pay

## 2017-04-15 VITALS — BP 168/102 | HR 77

## 2017-04-15 DIAGNOSIS — I10 Essential (primary) hypertension: Secondary | ICD-10-CM

## 2017-04-15 LAB — BASIC METABOLIC PANEL WITH GFR
BUN/Creatinine Ratio: 18 (ref 12–28)
BUN: 20 mg/dL (ref 8–27)
CO2: 21 mmol/L (ref 20–29)
Calcium: 9.5 mg/dL (ref 8.7–10.3)
Chloride: 107 mmol/L — ABNORMAL HIGH (ref 96–106)
Creatinine, Ser: 1.14 mg/dL — ABNORMAL HIGH (ref 0.57–1.00)
GFR calc Af Amer: 51 mL/min/1.73 — ABNORMAL LOW
GFR calc non Af Amer: 44 mL/min/1.73 — ABNORMAL LOW
Glucose: 93 mg/dL (ref 65–99)
Potassium: 4 mmol/L (ref 3.5–5.2)
Sodium: 141 mmol/L (ref 134–144)

## 2017-04-15 NOTE — Patient Instructions (Addendum)
Return for a follow up appointment in 3-4 weeks  Your blood pressure goal is 140/6290mmHg  Check your blood pressure at home daily (if able) and keep record of the readings.  Take your BP meds as follows: CONTINUE lisinopril 40mg  ONCE a day, spironolactone 25mg  ONCE a day  CHECK how you have been taking CARVEDILOL - START taking carvedilol 9.375mg  (1.5 tablets) TWICE a day (about 12 hours apart)  CONTINUE all other medications as prescribed.   CALL [phone] with any questions or concerns.   Bring your BP cuff and your record of home blood pressures to your next appointment.  Exercise as you're able, try to walk approximately 30 minutes per day.  Keep salt intake to a minimum, especially watch canned and prepared boxed foods.  Eat more fresh fruits and vegetables and fewer canned items.  Avoid eating in fast food restaurants.    HOW TO TAKE YOUR BLOOD PRESSURE: . Rest 5 minutes before taking your blood pressure. .  Don't smoke or drink caffeinated beverages for at least 30 minutes before. . Take your blood pressure before (not after) you eat. . Sit comfortably with your back supported and both feet on the floor (don't cross your legs). . Elevate your arm to heart level on a table or a desk. . Use the proper sized cuff. It should fit smoothly and snugly around your bare upper arm. There should be enough room to slip a fingertip under the cuff. The bottom edge of the cuff should be 1 inch above the crease of the elbow. . Ideally, take 3 measurements at one sitting and record the average.

## 2017-04-15 NOTE — Progress Notes (Signed)
Patient ID: Sharon Bailey                 DOB: 31-Jan-1930                      MRN: [id number]     HPI: Sharon Bailey is a 84 y.o. female patient of Dr. Okey DupreEnd who presents today for hypertension follow up. PMH significant for HTN, HLD, hypothyroidism, and gout. At her most recent visit her spironolactone was titrated to 25mg  daily.   She presents today for follow up. She reports that she has done well for a few weeks, but yesterday her pressure was high and this has created a lot of anxiety. She states that she has not be able to get her pressures down since yesterday. Her pressures have been >200 for the systolic since yesterday - except right before bed she did get it down to 160s. She states she barely slept last night as she was so anxious about our visit and her pressures. She denies chest pain, SOB, headaches, and dizziness today and since her last visit in HTN clinic. She never gets a good night's rest as she is constantly anxious about things.   She seems very anxious today and is eager to get her blood pressures down. She reports that she thinks she has been taking the carvedilol only ONCE daily.   Current HTN meds: carvedilol 9.375mg  BID - she thinks she has only been doing 1.5tablets one time a day, lisinopril 40mg  daily (AM), spironolactone 25mg  daily (AM) Previously tried: HCTZ - frequent urination BP goal: <140/3990mmHg  Family History: Sister with heart disease.  Social History: Denies tobacco, alcohol, and illicit drug use.  Diet: Breakfast - cereal and coffee. Lunch - sandwich or leftovers. Dinner - chicken, ground beef, or fish. Snacks on fruit. Does add salt to food when cooking.  Exercise: Minimal, fatigues with walking and has back pain. Does stay busy with housework.  Home BP readings: 148/unknown until yesterday - she has seen 200 or greater systolic - HR 89-90s at home the last few weeks.    Wt Readings from Last 3 Encounters:  02/05/17 150 lb (68 kg)  01/20/17 161  lb 2 oz (73.1 kg)  09/21/16 163 lb 12.8 oz (74.3 kg)   BP Readings from Last 3 Encounters:  04/15/17 (!) 202/106  03/24/17 (!) 175/102  03/09/17 (!) 155/95   Pulse Readings from Last 3 Encounters:  04/15/17 85  03/24/17 90  03/09/17 65    Renal function: CrCl cannot be calculated (Patient's most recent lab result is older than the maximum 21 days allowed.).  Past Medical History:  Diagnosis Date  . Abnormal EKG   . Collagenous colitis   . Collagenous colitis   . Diverticulitis   . Diverticulosis   . Fluttering heart   . GERD (gastroesophageal reflux disease)   . Gout   . Gout attack 09/28/2012  . Hammer toe of left foot 09/28/2012  . Hyperlipidemia   . Hypertension   . Hypothyroidism   . Irregular heart beat   . Pain in joint, ankle and foot 09/28/2012  . Status post foot surgery 10/11/2012  . Thyroid disease     Current Outpatient Medications on File Prior to Visit  Medication Sig Dispense Refill  . allopurinol (ZYLOPRIM) 300 MG tablet Take 300 mg by mouth daily.    Marland Kitchen. aspirin 81 MG tablet Take 81 mg by mouth daily.    . budesonide (ENTOCORT  EC) 3 MG 24 hr capsule Take 2 capsules (6 mg total) by mouth every morning. 180 capsule 3  . carvedilol (COREG) 6.25 MG tablet Take 1.5 tablets (9.375 mg total) by mouth 2 (two) times daily. 90 tablet 11  . levothyroxine (SYNTHROID, LEVOTHROID) 100 MCG tablet Take 100 mcg by mouth daily before breakfast.     . lisinopril (PRINIVIL,ZESTRIL) 40 MG tablet Take 1 tablet (40 mg total) by mouth daily. 90 tablet 3  . loratadine (CLARITIN) 10 MG tablet Take 10 mg by mouth daily as needed for allergies.     . Multiple Vitamins-Minerals (MULTI FOR HER 50+) TABS Take 1 tablet by mouth daily.     . simvastatin (ZOCOR) 40 MG tablet Take 40 mg by mouth daily.     Marland Kitchen spironolactone (ALDACTONE) 25 MG tablet Take 0.5 tablets (12.5 mg total) by mouth daily. (Patient taking differently: Take 25 mg by mouth daily. ) 90 tablet 3  . ibuprofen  (ADVIL,MOTRIN) 200 MG tablet Take 200 mg by mouth every 6 (six) hours as needed (pain).      No current facility-administered medications on file prior to visit.     Allergies  Allergen Reactions  . Sulfamethoxazole-Trimethoprim Diarrhea and Nausea Only    Blood pressure (!) 202/106, pulse 85.  Clonidine 0.2mg  (LOT # [id number] - exp 04/2018) given in clinic today due to elevated pressures.   BP after clonidine - 168/102  Assessment/Plan: Hypertension: BP today markedly elevated. Pressures did come down after a dose of clonidine. I believe that her pressures may be somewhat driven by anxiety about upcoming visit (given that they have been mostly controlled at home until yesterday when she realized that she had an appt today). BMET today after dose increase of spironolactone. Have instructed her to check on how she is taking her carvedilol - will have her take 9.375mg  BID instead of once daily. Will continue all other medications as prescribed pending BMET results. Follow up with Dr. Okey Dupre as scheduled in 3 weeks and HTN clinic after if needed.     Thank you, Freddie Apley. Cleatis Polka, PharmD  [facility]  04/15/2017 11:00 AM

## 2017-05-10 ENCOUNTER — Encounter: Payer: Self-pay | Admitting: Internal Medicine

## 2017-05-10 ENCOUNTER — Ambulatory Visit: Payer: Medicare Other | Admitting: Internal Medicine

## 2017-05-10 VITALS — BP 152/80 | HR 86 | Ht 64.0 in | Wt 162.8 lb

## 2017-05-10 DIAGNOSIS — R0789 Other chest pain: Secondary | ICD-10-CM

## 2017-05-10 DIAGNOSIS — I10 Essential (primary) hypertension: Secondary | ICD-10-CM | POA: Diagnosis not present

## 2017-05-10 DIAGNOSIS — R5382 Chronic fatigue, unspecified: Secondary | ICD-10-CM | POA: Diagnosis not present

## 2017-05-10 NOTE — Patient Instructions (Addendum)
Medication Instructions:  Your physician recommends that you continue on your current medications as directed. Please refer to the Current Medication list given to you today.  -- If you need a refill on your cardiac medications before your next appointment, please call your pharmacy. --  Labwork: None ordered  Testing/Procedures: None ordered  Follow-Up: Your physician wants you to follow-up in: 3 MONTHS with Dr. Okey Dupre.     Thank you for choosing [facility]!!    Any Other Special Instructions Will Be Listed Below (If Applicable).   DRINK plenty of water  TAKE Blood Pressure 3 DAYS a WEEK, 160/100 Inform us.Marland Kitchen

## 2017-05-10 NOTE — Progress Notes (Signed)
Follow-up Outpatient Visit Date: 05/10/2017  Primary Care Provider: Alroy Dust, L.Marlou Sa, [place] [employer] [place] [address]  Chief Complaint: Blood pressure problems  HPI:  Sharon Bailey is a 83 y.o. year-old female with history of hypertension, hyperlipidemia, hypothyroidism, and gout, who presents for follow-up of hypertension and fatigue.  I last saw Sharon Bailey in early February, at which time she reported continued easy fatigability (such as walking to her mailbox).  Her blood pressure was also poorly controlled, which has prompted several follow-up appointments in the hypertension clinic.  BP was poorly controlled at her most recent visit on 04/15/17, at which time it was 202/106 (improved to 168/102 after clonidine 0.2 mg x 1 given in the office).  Today, Sharon Bailey feels well.  She is somewhat concerned, however, of symptomatic hypotension since her last visit.  She has experienced a few episodes of low energy and somnolence in the setting of systolic blood pressures between 110 and 120 mmHg.  At other times, her blood pressure remains quite high.  She has been taking her medications as prescribed and is tolerating them well.  She notes rare "flip-flops" in her chest but no sustained palpitations.  She also denies chest pain, lightheadedness, and edema.  --------------------------------------------------------------------------------------------------  Cardiovascular History & Procedures: Cardiovascular Problems:  Palpitations  Fatigue  Risk Factors:  Hypertension, hyperlipidemia, and age greater than 58  Cath/PCI:  None  CV Surgery:  None  EP Procedures and Devices:  48-hour Holter monitor (04/30/16): Predominantly sinus rhythm with rare PACs and single episode of PSVT (6 beats).  Non-Invasive Evaluation(s):  Pharmacologic MPI (08/31/16): Normal study without ischemia or scar. LVEF > 65%.  TTE (04/30/16): Normal LV size with mild LVH. LVEF 55-60% with  normal wall motion and grade 1 diastolic dysfunction. Mild AI. MAC. Normal RV size and function. Normal PA pressure.   Recent CV Pertinent Labs: Lab Results  Component Value Date   INR 2.38 (H) 07/05/2010   K 4.0 04/15/2017   BUN 20 04/15/2017   CREATININE 1.14 (H) 04/15/2017    Past medical and surgical history were reviewed and updated in EPIC.  Current Meds  Medication Sig  . allopurinol (ZYLOPRIM) 300 MG tablet Take 300 mg by mouth daily.  Marland Kitchen aspirin 81 MG tablet Take 81 mg by mouth daily.  . budesonide (ENTOCORT EC) 3 MG 24 hr capsule Take 2 capsules (6 mg total) by mouth every morning.  . carvedilol (COREG) 6.25 MG tablet Take 1.5 tablets (9.375 mg total) by mouth 2 (two) times daily.  Marland Kitchen ibuprofen (ADVIL,MOTRIN) 200 MG tablet Take 200 mg by mouth every 6 (six) hours as needed (pain).   Marland Kitchen levothyroxine (SYNTHROID, LEVOTHROID) 100 MCG tablet Take 100 mcg by mouth daily before breakfast.   . lisinopril (PRINIVIL,ZESTRIL) 40 MG tablet Take 1 tablet (40 mg total) by mouth daily.  Marland Kitchen loratadine (CLARITIN) 10 MG tablet Take 10 mg by mouth daily as needed for allergies.   . Multiple Vitamins-Minerals (MULTI FOR HER 50+) TABS Take 1 tablet by mouth daily.   . simvastatin (ZOCOR) 40 MG tablet Take 40 mg by mouth daily.   Marland Kitchen spironolactone (ALDACTONE) 25 MG tablet Take 0.5 tablets (12.5 mg total) by mouth daily. (Patient taking differently: Take 25 mg by mouth daily. )    Allergies: Sulfamethoxazole-trimethoprim  Social History   Tobacco Use  . Smoking status: Never Smoker  . Smokeless tobacco: Never Used  Substance Use Topics  . Alcohol use: No  Alcohol/week: 0.0 oz  . Drug use: No    Family History  Problem Relation Age of Onset  . Breast cancer Mother   . Memory loss Father   . Heart disease Sister   . Rheumatic fever Sister   . Lung disease Brother     Review of Systems: A 12-system review of systems was performed and was negative except as noted in the  HPI.  --------------------------------------------------------------------------------------------------  Physical Exam: BP (!) 152/80   Pulse 86   Ht _0  (1.626 m)   Wt 162 lb 12.8 oz (73.8 kg)   BMI 27.94 kg/m   General: NAD. HEENT: No conjunctival pallor or scleral icterus. Moist mucous membranes.  OP clear. Neck: Supple without lymphadenopathy, thyromegaly, JVD, or HJR. Lungs: Normal work of breathing. Clear to auscultation bilaterally without wheezes or crackles. Heart: Regular rate and rhythm without murmurs, rubs, or gallops. Non-displaced PMI. Abd: Bowel sounds present. Soft, NT/ND without hepatosplenomegaly Ext: No lower extremity edema. Skin: Warm and dry without rash.  Lab Results  Component Value Date   WBC 11.1 (H) 12/17/2014   HGB 11.0 (L) 12/17/2014   HCT 34.2 (L) 12/17/2014   MCV 85.9 12/17/2014   PLT 243.0 12/17/2014    Lab Results  Component Value Date   NA 141 04/15/2017   K 4.0 04/15/2017   CL 107 (H) 04/15/2017   CO2 21 04/15/2017   BUN 20 04/15/2017   CREATININE 1.14 (H) 04/15/2017   GLUCOSE 93 04/15/2017   ALT 12 (L) 10/07/2014    No results found for: CHOL, HDL, LDLCALC, LDLDIRECT, TRIG, CHOLHDL  --------------------------------------------------------------------------------------------------  ASSESSMENT AND PLAN: Hypertension Blood pressure modestly elevated today but better than at prior visits.  Over the last month, Sharon Bailey has experienced a few episodes of symptomatic hypotension (though her blood pressure has never truly been low based on her home checks).  I would not make any medication changes today and tolerated degree of permissive hypertension given Sharon Bailey's age and symptoms associated with low normal blood pressure.  I encouraged her to continue avoiding salt.  She should monitor her blood pressure at least 3 days a week and contact us if it is consistently above 160/100.  We have discussed the need for work-up for secondary  hypertension but have agreed to defer this unless her blood pressure remains difficult to control.  Atypical chest pain No further episodes.  Defer additional work-up at this time.  Fatigue This has been a long-standing issue for Sharon Bailey and overall seems stable.  We will defer additional testing at this time.  Follow-up: Return to clinic in 3 months.  Nelva Bush, MD 05/10/2017 10:53 AM

## 2017-05-11 ENCOUNTER — Encounter: Payer: Self-pay | Admitting: Internal Medicine

## 2017-07-16 ENCOUNTER — Ambulatory Visit (INDEPENDENT_AMBULATORY_CARE_PROVIDER_SITE_OTHER): Payer: Self-pay

## 2017-07-16 ENCOUNTER — Ambulatory Visit (INDEPENDENT_AMBULATORY_CARE_PROVIDER_SITE_OTHER): Payer: Medicare Other | Admitting: Orthopaedic Surgery

## 2017-07-16 DIAGNOSIS — M1811 Unilateral primary osteoarthritis of first carpometacarpal joint, right hand: Secondary | ICD-10-CM | POA: Diagnosis not present

## 2017-07-16 MED ORDER — METHYLPREDNISOLONE ACETATE 40 MG/ML IJ SUSP
13.3300 mg | INTRAMUSCULAR | Status: AC | PRN
  Administered 2017-07-16: 13.33 mg via INTRA_ARTICULAR

## 2017-07-16 MED ORDER — LIDOCAINE HCL 1 % IJ SOLN
0.3000 mL | INTRAMUSCULAR | Status: AC | PRN
  Administered 2017-07-16: .3 mL

## 2017-07-16 MED ORDER — BUPIVACAINE HCL 0.5 % IJ SOLN
0.3300 mL | INTRAMUSCULAR | Status: AC | PRN
  Administered 2017-07-16: .33 mL via INTRA_ARTICULAR

## 2017-07-16 MED ORDER — DICLOFENAC SODIUM 1 % TD GEL: 2.0000 g | Freq: Four times a day (QID) | TRANSDERMAL | 5 refills | Status: DC

## 2017-07-16 NOTE — Progress Notes (Signed)
Office Visit Note   Patient: Sharon Bailey           Date of Birth: 02-13-1930           MRN: [id number] Visit Date: 07/16/2017              Requested by: Clovis RileyMitchell, L.August Saucerean, MD [address] [place], [place] [address] PCP: Clovis RileyMitchell, L.August Saucerean, MD   Assessment & Plan: Visit Diagnoses:  1. Primary osteoarthritis of first carpometacarpal joint of right hand     Plan: Impression is advanced right thumb CMC arthritis.  Cortisone injection performed today.  We fitted her with a thumb abduction orthotic.  Prescription for Voltaren gel.  Follow-up as needed.  Follow-Up Instructions: Return if symptoms worsen or fail to improve.   Orders:  Orders Placed This Encounter  Procedures  . XR Hand Complete Right   Meds ordered this encounter  Medications  . diclofenac sodium (VOLTAREN) 1 % GEL    Sig: Apply 2 g topically 4 (four) times daily.    Dispense:  1 Tube    Refill:  5      Procedures: Small Joint Inj: R thumb CMC on 07/16/2017 9:13 AM Indications: pain Details: 22 G needle Medications: 0.3 mL lidocaine 1 %; 0.33 mL bupivacaine 0.5 %; 13.33 mg methylPREDNISolone acetate 40 MG/ML Outcome: tolerated well, no immediate complications Patient was prepped and draped in the usual sterile fashion.       Clinical Data: No additional findings.   Subjective: Chief Complaint  Patient presents with  . Right Hand - Pain    Sharon NixonJanice is a 84 year old female that is right-hand dominant who comes in with 6-week history of constant pain is worse with use of her hand.  The pain localizes to the base of the thumb.  Denies any tingling.  She has decreased strength and inability to use her hand.  Denies any injuries.   Review of Systems  Constitutional: Negative.   HENT: Negative.   Eyes: Negative.   Respiratory: Negative.   Cardiovascular: Negative.   Endocrine: Negative.   Musculoskeletal: Negative.   Neurological: Negative.   Hematological: Negative.     Psychiatric/Behavioral: Negative.   All other systems reviewed and are negative.    Objective: Vital Signs: There were no vitals taken for this visit.  Physical Exam  Constitutional: She is oriented to person, place, and time. She appears well-developed and well-nourished.  HENT:  Head: Normocephalic and atraumatic.  Eyes: EOM are normal.  Neck: Neck supple.  Pulmonary/Chest: Effort normal.  Abdominal: Soft.  Neurological: She is alert and oriented to person, place, and time.  Skin: Skin is warm. Capillary refill takes less than 2 seconds.  Psychiatric: She has a normal mood and affect. Her behavior is normal. Judgment and thought content normal.  Nursing note and vitals reviewed.   Ortho Exam Right thumb exam shows positive grind test.  Negative Finkelstein's.  Decreased grip strength secondary to pain. Specialty Comments:  No specialty comments available.  Imaging: Xr Hand Complete Right  Result Date: 07/16/2017 Advanced arthrosis of thumb CMC joint    PMFS History: Patient Active Problem List   Diagnosis Date Noted  . Chronic fatigue 02/05/2017  . Atypical chest pain 02/05/2017  . Hyperlipidemia 02/05/2017  . Palpitations 04/18/2016  . Essential hypertension 04/18/2016  . Hypertensive heart disease without heart failure 04/18/2016  . Status post foot surgery 10/11/2012  . Gout attack 09/28/2012  . Hammer toe of left foot 09/28/2012  .  Pain in joint, ankle and foot 09/28/2012   Past Medical History:  Diagnosis Date  . Abnormal EKG   . Collagenous colitis   . Collagenous colitis   . Diverticulitis   . Diverticulosis   . Fluttering heart   . GERD (gastroesophageal reflux disease)   . Gout   . Gout attack 09/28/2012  . Hammer toe of left foot 09/28/2012  . Hyperlipidemia   . Hypertension   . Hypothyroidism   . Irregular heart beat   . Pain in joint, ankle and foot 09/28/2012  . Status post foot surgery 10/11/2012  . Thyroid disease     Family History   Problem Relation Age of Onset  . Breast cancer Mother   . Memory loss Father   . Heart disease Sister   . Rheumatic fever Sister   . Lung disease Brother     Past Surgical History:  Procedure Laterality Date  . ABDOMINAL HYSTERECTOMY    . ADENOIDECTOMY    . CHOLECYSTECTOMY    . Excision Ganglion Toe Left 09/29/2012   Lt #2 @ [facility]  . Hammer toe repair Left 09/29/2012   Lt #2 @ [facility]  . TONSILLECTOMY     Social History   Occupational History  . Not on file  Tobacco Use  . Smoking status: Never Smoker  . Smokeless tobacco: Never Used  Substance and Sexual Activity  . Alcohol use: No    Alcohol/week: 0.0 oz  . Drug use: No  . Sexual activity: Not on file

## 2017-08-19 ENCOUNTER — Ambulatory Visit: Payer: Medicare Other | Admitting: Internal Medicine

## 2017-08-19 ENCOUNTER — Encounter: Payer: Self-pay | Admitting: Internal Medicine

## 2017-08-19 VITALS — BP 160/88 | HR 92 | Ht 64.0 in | Wt 165.8 lb

## 2017-08-19 DIAGNOSIS — I10 Essential (primary) hypertension: Secondary | ICD-10-CM | POA: Diagnosis not present

## 2017-08-19 DIAGNOSIS — I493 Ventricular premature depolarization: Secondary | ICD-10-CM | POA: Diagnosis not present

## 2017-08-19 DIAGNOSIS — R5382 Chronic fatigue, unspecified: Secondary | ICD-10-CM

## 2017-08-19 MED ORDER — CARVEDILOL 12.5 MG PO TABS: 12.5000 mg | ORAL_TABLET | Freq: Two times a day (BID) | ORAL | 3 refills | Status: DC

## 2017-08-19 NOTE — Patient Instructions (Addendum)
Medication Instructions:  Carvedilol 12.5 mg twice per day   -- If you need a refill on your cardiac medications before your next appointment, please call your pharmacy. --  Labwork: None ordered  Testing/Procedures: None ordered  Follow-Up: Your physician wants you to follow-up in: 6 months with Sharon NewcomerScott Bailey   You will receive a reminder letter in the mail two months in advance. If you don't receive a letter, please call our office to schedule the follow-up appointment.  Thank you for choosing [facility]!!    Any Other Special Instructions Will Be Listed Below (If Applicable).

## 2017-08-19 NOTE — Progress Notes (Signed)
Follow-up Outpatient Visit Date: 08/19/2017  Primary Care Provider: Alroy Dust, L.Marlou Sa, [place] [employer] [place] [address]  Chief Complaint: Bruising  HPI:  Ms. Mcwhirter is a 84 y.o. year-old female with history of hypertension, hyperlipidemia, hypothyroidism, and gout, who presents for follow-up of hypertension and fatigue.  I last saw her in May, at which time she was feeling well.  She reported some symptomatic hypotension, including low energy and somnolence in the setting of systolic blood pressures between 110 and 120 mmHg.  She noted that at other times, her blood pressure was quite high.  We did not make any medication changes at that time, as her blood pressure was only mildly elevated and we felt that a degree of permissive hypertension would be reasonable to prevent lightheadedness and falls.  She has not had any further episodes of chest pain, which she had described at prior visits.  We agreed to defer further testing given normal Myoview in 08/2016.  Today, the patient reports feeling well other than easy bruising.  She stopped taking aspirin after seeing her PCP this spring.  She still has some exertional dyspnea with modest activity, unchanged for at least the last year.  She notes that her blood pressure continues to fluctuate with systolic readings on average around 140 to 150 mmHg.  She has not had any low readings nor lightheadedness.  She denies chest pain, palpitations, orthopnea, PND, and edema.  --------------------------------------------------------------------------------------------------  Cardiovascular History & Procedures: Cardiovascular Problems:  Palpitations  Fatigue  Risk Factors:  Hypertension, hyperlipidemia, and age greater than 21  Cath/PCI:  None  CV Surgery:  None  EP Procedures and Devices:  48-hour Holter monitor (04/30/16): Predominantly sinus rhythm with rare PACs and single episode of PSVT (6 beats).  Non-Invasive  Evaluation(s):  Pharmacologic MPI (08/31/16): Normal study without ischemia or scar. LVEF > 65%.  TTE (04/30/16): Normal LV size with mild LVH. LVEF 55-60% with normal wall motion and grade 1 diastolic dysfunction. Mild AI. MAC. Normal RV size and function. Normal PA pressure.  Recent CV Pertinent Labs: Lab Results  Component Value Date   INR 2.38 (H) 07/05/2010   K 4.0 04/15/2017   BUN 20 04/15/2017   CREATININE 1.14 (H) 04/15/2017    Past medical and surgical history were reviewed and updated in EPIC.  Current Meds  Medication Sig  . allopurinol (ZYLOPRIM) 300 MG tablet Take 300 mg by mouth daily.  . budesonide (ENTOCORT EC) 3 MG 24 hr capsule Take 2 capsules (6 mg total) by mouth every morning.  . carvedilol (COREG) 6.25 MG tablet Take 1.5 tablets (9.375 mg total) by mouth 2 (two) times daily.  . diclofenac sodium (VOLTAREN) 1 % GEL Apply 2 g topically 4 (four) times daily.  Marland Kitchen ibuprofen (ADVIL,MOTRIN) 200 MG tablet Take 200 mg by mouth every 6 (six) hours as needed (pain).   Marland Kitchen levothyroxine (SYNTHROID, LEVOTHROID) 100 MCG tablet Take 100 mcg by mouth daily before breakfast.   . lisinopril (PRINIVIL,ZESTRIL) 40 MG tablet Take 1 tablet (40 mg total) by mouth daily.  Marland Kitchen loratadine (CLARITIN) 10 MG tablet Take 10 mg by mouth daily as needed for allergies.   . Multiple Vitamins-Minerals (MULTI FOR HER 50+) TABS Take 1 tablet by mouth daily.   . simvastatin (ZOCOR) 40 MG tablet Take 40 mg by mouth daily.   Marland Kitchen spironolactone (ALDACTONE) 25 MG tablet Take 25 mg by mouth daily.    Allergies: Sulfamethoxazole-trimethoprim  Social History   Tobacco Use  .  Smoking status: Never Smoker  . Smokeless tobacco: Never Used  Substance Use Topics  . Alcohol use: No    Alcohol/week: 0.0 standard drinks  . Drug use: No    Family History  Problem Relation Age of Onset  . Breast cancer Mother   . Memory loss Father   . Heart disease Sister   . Rheumatic fever Sister   . Lung disease Brother      Review of Systems: A 12-system review of systems was performed and was negative except as noted in the HPI.  --------------------------------------------------------------------------------------------------  Physical Exam: BP (!) 160/88   Pulse 92   Ht _0  (1.626 m)   Wt 165 lb 12.8 oz (75.2 kg)   SpO2 98%   BMI 28.46 kg/m   General: NAD. HEENT: No conjunctival pallor or scleral icterus. Moist mucous membranes.  OP clear. Neck: Supple without lymphadenopathy, thyromegaly, JVD, or HJR. Lungs: Normal work of breathing. Clear to auscultation bilaterally without wheezes or crackles. Heart: Regular rate and rhythm with occasional extrasystoles.  No murmurs, rubs, or gallops. Non-displaced PMI. Abd: Bowel sounds present. Soft, NT/ND without hepatosplenomegaly Ext: 1+ distal calf edema bilaterally. Radial, PT, and DP pulses are 2+ bilaterally. Skin: Warm and dry without rash.  EKG: Normal sinus rhythm with occasional PVCs.  LVH and poor R wave progression.  Other than isolated PVC, no significant change from prior tracing on 08/21/2016.  Lab Results  Component Value Date   WBC 11.1 (H) 12/17/2014   HGB 11.0 (L) 12/17/2014   HCT 34.2 (L) 12/17/2014   MCV 85.9 12/17/2014   PLT 243.0 12/17/2014    Lab Results  Component Value Date   NA 141 04/15/2017   K 4.0 04/15/2017   CL 107 (H) 04/15/2017   CO2 21 04/15/2017   BUN 20 04/15/2017   CREATININE 1.14 (H) 04/15/2017   GLUCOSE 93 04/15/2017   ALT 12 (L) 10/07/2014    No results found for: CHOL, HDL, LDLCALC, LDLDIRECT, TRIG, CHOLHDL  --------------------------------------------------------------------------------------------------  ASSESSMENT AND PLAN: Hypertension Blood pressure remains suboptimally controlled today.  Patient has not had any symptomatic hypotension since our last visit.  We have agreed to increase carvedilol to 12.5 mg twice daily.  I will continue current doses of spironolactone and lisinopril.   Patient reports labs done within the last 1 to 2 months through her PCP.  We will request a copy of these labs today.  If she has not had a BMP in the last 3 months, we will need to have that drawn at her convenience given ongoing ACE inhibitor and aldosterone antagonist therapy.  PVCs Incidentally noted on EKG today.  No symptoms associated with this.  We will increase carvedilol, as above.  No further work-up.  Chronic fatigue Long-standing and likely multifactorial including deconditioning and diastolic dysfunction in the setting of uncontrolled hypertension.  We will increase carvedilol, as above.  I having encouraged Ms. Dotzler to exercise, as tolerated.  Follow-up: Return to clinic in 6 months with Richardson Dopp, PA.  Nelva Bush, MD 08/19/2017 1:21 PM

## 2017-09-21 ENCOUNTER — Ambulatory Visit (INDEPENDENT_AMBULATORY_CARE_PROVIDER_SITE_OTHER): Payer: Medicare Other | Admitting: Orthopaedic Surgery

## 2017-09-21 ENCOUNTER — Encounter (INDEPENDENT_AMBULATORY_CARE_PROVIDER_SITE_OTHER): Payer: Self-pay | Admitting: Orthopaedic Surgery

## 2017-09-21 ENCOUNTER — Other Ambulatory Visit (INDEPENDENT_AMBULATORY_CARE_PROVIDER_SITE_OTHER): Payer: Self-pay | Admitting: Orthopaedic Surgery

## 2017-09-21 ENCOUNTER — Ambulatory Visit (INDEPENDENT_AMBULATORY_CARE_PROVIDER_SITE_OTHER): Payer: Self-pay

## 2017-09-21 DIAGNOSIS — M545 Low back pain, unspecified: Secondary | ICD-10-CM | POA: Insufficient documentation

## 2017-09-21 MED ORDER — METHOCARBAMOL 500 MG PO TABS: 500.0000 mg | ORAL_TABLET | Freq: Every evening | ORAL | 0 refills | Status: DC | PRN

## 2017-09-21 MED ORDER — METHYLPREDNISOLONE 4 MG PO TBPK: ORAL_TABLET | ORAL | 0 refills | Status: DC
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
## 2017-11-02 ENCOUNTER — Other Ambulatory Visit: Payer: Self-pay | Admitting: Family Medicine

## 2017-11-02 DIAGNOSIS — Z1231 Encounter for screening mammogram for malignant neoplasm of breast: Secondary | ICD-10-CM

## 2017-12-15 ENCOUNTER — Ambulatory Visit
Admission: RE | Admit: 2017-12-15 | Discharge: 2017-12-15 | Disposition: A | Discharge status: Home / Self Care | Payer: Medicare Other | Source: Ambulatory Visit | Attending: Family Medicine | Admitting: Family Medicine

## 2017-12-15 DIAGNOSIS — Z1231 Encounter for screening mammogram for malignant neoplasm of breast: Secondary | ICD-10-CM

## 2018-01-21 ENCOUNTER — Ambulatory Visit: Payer: Medicare Other | Admitting: Internal Medicine

## 2018-01-21 ENCOUNTER — Encounter (INDEPENDENT_AMBULATORY_CARE_PROVIDER_SITE_OTHER): Payer: Self-pay

## 2018-01-21 ENCOUNTER — Encounter: Payer: Self-pay | Admitting: Internal Medicine

## 2018-01-21 VITALS — BP 148/88 | HR 90 | Ht 64.0 in | Wt 167.0 lb

## 2018-01-21 DIAGNOSIS — K52831 Collagenous colitis: Secondary | ICD-10-CM | POA: Diagnosis not present

## 2018-01-21 MED ORDER — BUDESONIDE 3 MG PO CPEP: 6.0000 mg | ORAL_CAPSULE | Freq: Every day | ORAL | 3 refills | Status: DC

## 2018-01-21 NOTE — Progress Notes (Signed)
HISTORY OF PRESENT ILLNESS:  Sharon Bailey is a 85 y.o. female diagnosed with collagenous colitis December 2016.  She has been treated with and responded to budesonide.  At the time of her last evaluation on January 20, 2017 the patient found that she had recurrent symptoms when she weaned budesonide down to 3 mg/day.  Since that visit she has been maintained on 6 mg of budesonide daily.  She describes 2 formed bowel movements per day with very rare episodes of diarrhea.  No incontinence.  She had been encouraged previously to try to wean her medication but has not.  Her GI review of systems is negative.  She has no appreciable medication side effects.  Review of outside blood work from April 2019 finds mild renal insufficiency with creatinine 1.14.  Normal glucose.  REVIEW OF SYSTEMS:  All non-GI ROS negative unless otherwise stated in the HPI except for back pain and fatigue Past Medical History:  Diagnosis Date  . Abnormal EKG   . Collagenous colitis   . Collagenous colitis   . Diverticulitis   . Diverticulosis   . Fluttering heart   . GERD (gastroesophageal reflux disease)   . Gout   . Gout attack 09/28/2012  . Hammer toe of left foot 09/28/2012  . Hyperlipidemia   . Hypertension   . Hypothyroidism   . Irregular heart beat   . Pain in joint, ankle and foot 09/28/2012  . Status post foot surgery 10/11/2012  . Thyroid disease     Past Surgical History:  Procedure Laterality Date  . ABDOMINAL HYSTERECTOMY    . ADENOIDECTOMY    . CHOLECYSTECTOMY    . Excision Ganglion Toe Left 09/29/2012   Lt #2 @ [facility]  . Hammer toe repair Left 09/29/2012   Lt #2 @ [facility]  . TONSILLECTOMY      Social History Sharon Bailey  reports that she has never smoked. She has never used smokeless tobacco. She reports that she does not drink alcohol or use drugs.  family history includes Breast cancer in her mother; Heart disease in her sister; Lung disease in her brother; Memory loss in her father; Rheumatic  fever in her sister.  Allergies  Allergen Reactions  . Sulfamethoxazole-Trimethoprim Diarrhea and Nausea Only       PHYSICAL EXAMINATION: Vital signs: BP (!) 148/88   Pulse 90   Ht 5\' 4"  (1.626 m)   Wt 167 lb (75.8 kg)   BMI 28.67 kg/m   Constitutional: generally well-appearing, no acute distress.  Hard of hearing Psychiatric: alert and oriented x3, cooperative Eyes: extraocular movements intact, anicteric, conjunctiva pink Mouth: oral pharynx moist, no lesions Neck: supple no lymphadenopathy Cardiovascular: heart regular rate and rhythm, no murmur Lungs: clear to auscultation bilaterally Abdomen: soft, nontender, nondistended, no obvious ascites, no peritoneal signs, normal bowel sounds, no organomegaly Rectal: Omitted Extremities: no clubbing, cyanosis, or lower extremity edema bilaterally Skin: no lesions on visible extremities Neuro: No focal deficits.  Cranial nerves intact  ASSESSMENT:  1.  Collagenous colitis diagnosed December 2016.  Patient is asymptomatic on budesonide 6 mg daily   PLAN:  1.  I have instructed the patient to decrease budesonide to 3 mg daily for 3 months.  If she does well then discontinue the medication.  If she has persistent diarrhea on 3 mg daily then returned to 6 mg daily.  If she does well on 3 mg daily but has persistent diarrhea off medication then resume 3 mg daily.  If she  does well off medication, then stay off medication.  I would like to see her for follow-up in 1 year.  I have instructed her to contact this office should she have any questions or problems.  We have refilled her medication for 1 year.  She will resume her general medical care with Dr. Clovis Riley. 25-minute spent face-to-face with the patient.  Greater than 50% the time used for counseling regarding her collagenous colitis and discussing her medical therapy and carefully instructing management moving forward as outlined with drug tapering to achieve the lowest effective dose  of medication.

## 2018-01-21 NOTE — Patient Instructions (Signed)
Decrease your Budesonide from 6mg  (2 tablets) daily to 3 mg (1 tablet daily) for three months.  If you are doing well after three months, you may stop completely and see how you feel.  If you don't do well after decreasing to 3mg , you can go back up to 6mg   Please follow up in one year

## 2018-02-09 ENCOUNTER — Other Ambulatory Visit: Payer: Self-pay | Admitting: Internal Medicine

## 2018-02-09 ENCOUNTER — Encounter: Payer: Self-pay | Admitting: Podiatry

## 2018-02-09 ENCOUNTER — Ambulatory Visit: Payer: Medicare Other | Admitting: Podiatry

## 2018-02-09 VITALS — BP 166/88 | HR 81

## 2018-02-09 DIAGNOSIS — B351 Tinea unguium: Secondary | ICD-10-CM | POA: Diagnosis not present

## 2018-02-09 DIAGNOSIS — I739 Peripheral vascular disease, unspecified: Secondary | ICD-10-CM

## 2018-02-09 DIAGNOSIS — M79675 Pain in left toe(s): Secondary | ICD-10-CM | POA: Diagnosis not present

## 2018-02-09 DIAGNOSIS — L84 Corns and callosities: Secondary | ICD-10-CM

## 2018-02-09 DIAGNOSIS — M79674 Pain in right toe(s): Secondary | ICD-10-CM

## 2018-02-09 NOTE — Telephone Encounter (Signed)
Refill Request.  

## 2018-02-09 NOTE — Patient Instructions (Signed)

## 2018-02-13 ENCOUNTER — Encounter: Payer: Self-pay | Admitting: Podiatry

## 2018-02-13 NOTE — Progress Notes (Signed)
Subjective: Sharon Bailey presents today referred by Clovis Riley, L.August Saucer, MD with diabetes and cc of painful, discolored, thick toenails which interfere with daily activities.  Pain is aggravated when wearing enclosed shoe gear. She has had Podiatric care in the past, but her Podiatrist retired recently.  Past Medical History:  Diagnosis Date  . Abnormal EKG   . Collagenous colitis   . Collagenous colitis   . Diverticulitis   . Diverticulosis   . Fluttering heart   . GERD (gastroesophageal reflux disease)   . Gout   . Gout attack 09/28/2012  . Hammer toe of left foot 09/28/2012  . Hyperlipidemia   . Hypertension   . Hypothyroidism   . Irregular heart beat   . Pain in joint, ankle and foot 09/28/2012  . Status post foot surgery 10/11/2012  . Thyroid disease     Patient Active Problem List   Diagnosis Date Noted  . Low back pain 09/21/2017  . Chronic fatigue 02/05/2017  . Atypical chest pain 02/05/2017  . Hyperlipidemia 02/05/2017  . Palpitations 04/18/2016  . Essential hypertension 04/18/2016  . Hypertensive heart disease without heart failure 04/18/2016  . Status post foot surgery 10/11/2012  . Gout attack 09/28/2012  . Hammer toe of left foot 09/28/2012  . Pain in joint, ankle and foot 09/28/2012    Past Surgical History:  Procedure Laterality Date  . ABDOMINAL HYSTERECTOMY    . ADENOIDECTOMY    . CHOLECYSTECTOMY    . Excision Ganglion Toe Left 09/29/2012   Lt #2 @ [facility]  . Hammer toe repair Left 09/29/2012   Lt #2 @ [facility]  . TONSILLECTOMY       Current Outpatient Medications:  .  allopurinol (ZYLOPRIM) 300 MG tablet, Take 300 mg by mouth daily., Disp: , Rfl:  .  budesonide (ENTOCORT EC) 3 MG 24 hr capsule, Take 2 capsules (6 mg total) by mouth daily., Disp: 180 capsule, Rfl: 3 .  carvedilol (COREG) 12.5 MG tablet, Take 1 tablet (12.5 mg total) by mouth 2 (two) times daily., Disp: 180 tablet, Rfl: 3 .  diclofenac sodium (VOLTAREN) 1 % GEL, Apply 2 g topically 4 (four)  times daily. (Patient taking differently: Apply 2 g topically 4 (four) times daily as needed. ), Disp: 1 Tube, Rfl: 5 .  ibuprofen (ADVIL,MOTRIN) 200 MG tablet, Take 200 mg by mouth every 6 (six) hours as needed (pain). , Disp: , Rfl:  .  levothyroxine (SYNTHROID, LEVOTHROID) 100 MCG tablet, Take 100 mcg by mouth daily before breakfast. , Disp: , Rfl:  .  lisinopril (PRINIVIL,ZESTRIL) 40 MG tablet, TAKE 1 TABLET BY MOUTH  DAILY, Disp: 90 tablet, Rfl: 2 .  loratadine (CLARITIN) 10 MG tablet, Take 10 mg by mouth daily as needed for allergies. , Disp: , Rfl:  .  methocarbamol (ROBAXIN) 500 MG tablet, Take 1 tablet (500 mg total) by mouth at bedtime as needed for muscle spasms., Disp: 15 tablet, Rfl: 0 .  Multiple Vitamins-Minerals (MULTI FOR HER 50+) TABS, Take 1 tablet by mouth daily. , Disp: , Rfl:  .  simvastatin (ZOCOR) 40 MG tablet, Take 40 mg by mouth daily. , Disp: , Rfl:  .  spironolactone (ALDACTONE) 25 MG tablet, Take 25 mg by mouth daily., Disp: , Rfl:   Allergies  Allergen Reactions  . Sulfamethoxazole-Trimethoprim Diarrhea and Nausea Only    Social History   Occupational History  . Not on file  Tobacco Use  . Smoking status: Never Smoker  . Smokeless tobacco: Never Used  Substance and Sexual Activity  . Alcohol use: No    Alcohol/week: 0.0 standard drinks  . Drug use: No  . Sexual activity: Not on file    Family History  Problem Relation Age of Onset  . Breast cancer Mother   . Memory loss Father   . Heart disease Sister   . Rheumatic fever Sister   . Lung disease Brother   . Colon cancer Neg Hx      There is no immunization history on file for this patient.   Review of systems: Positive Findings in bold print.  Constitutional:  chills, fatigue, fever, sweats, weight change Communication: Nurse, learning disability, sign Presenter, broadcasting, hand writing, iPad/Android device Head: headaches, head injury Eyes: changes in vision, eye pain, glaucoma, cataracts, macular  degeneration, diplopia, glare,  light sensitivity, eyeglasses or contacts, blindness Ears nose mouth throat: Hard of hearing, ringing in ears, deaf, sign language,  vertigo,   nosebleeds,  rhinitis,  cold sores, snoring, swollen glands Cardiovascular: HTN, edema, arrhythmia, pacemaker in place, defibrillator in place, chest pain/tightness, chronic anticoagulation, blood clot, heart failure Peripheral Vascular: leg cramps, varicose veins, blood clots, lymphedema Respiratory:  difficulty breathing, denies congestion, SOB, wheezing, cough, emphysema Gastrointestinal: change in appetite or weight, abdominal pain, constipation, diarrhea, nausea, vomiting, vomiting blood, change in bowel habits, GERD abdominal pain, jaundice, rectal bleeding, hemorrhoids, Genitourinary:  nocturia,  pain on urination,  blood in urine, Foley catheter, urinary urgency Musculoskeletal: uses mobility aid,  cramping, stiff joints, painful joints, decreased joint motion, fractures, OA, gout Skin: +changes in toenails, color change, dryness, itching, mole changes,  rash  Neurological: headaches, numbness in feet, paresthesias in feet, burning in feet, fainting,  seizures, change in speech. denies headaches, memory problems/poor historian, cerebral palsy, weakness, paralysis Endocrine: diabetes, hypothyroidism, hyperthyroidism,  goiter, dry mouth, flushing, heat intolerance,  cold intolerance,  excessive thirst, denies polyuria,  nocturia Hematological:  easy bleeding, excessive bleeding, easy bruising, enlarged lymph nodes, on long term blood thinner, history of past transusions Allergy/immunological:  hives, eczema, frequent infections, multiple drug allergies, seasonal allergies, transplant recipient Psychiatric:  anxiety, depression, mood disorder, suicidal ideations, hallucinations   Objective: Vascular Examination: Capillary refill time immediate x 10 digits Dorsalis pedis palpable b/l Posterior tibial pulses absent  b/l No digital hair x 10 digits Skin temperature gradient WNL b/l  Dermatological Examination: Skin thin and atrophic b/l  Toenails 1-5 b/l discolored, thick, dystrophic with subungual debris and pain with palpation to nailbeds due to thickness of nails.  Hyperkeratotic lesion distal right 2nd digit with subdermal hemorrhage. No erythema, no edema, no drainage, no flocculence, no warmth.   Musculoskeletal: Muscle strength 5/5 to all LE muscle groups.  Hammertoe 2-4 b/l.  Short 3rd digit left foot.  Neurological: Sensation intact with 10 gram monofilament Vibratory sensation intact.  Assessment: 1. Painful onychomycosis toenails 1-5 b/l  2. Preulcerative corn right 2nd digit 3. PAD  Plan: 1. Discussed diabetic foot care principles. Literature dispensed on today. 2. Toenails 1-5 b/l were debrided in length and girth without iatrogenic bleeding. 3. Corn pared right 2nd digit with sterile chisel blade without incident. 4. Patient to continue soft, supportive shoe gear 5. Patient to report any pedal injuries to medical professional immediately. 6. Follow up 3 months.  7. Patient/POA to call should there be a concern in the interim.

## 2018-02-14 NOTE — Addendum Note (Signed)
Addended by: Freddie BreechGALAWAY, Kehaulani Fruin L on: 02/14/2018 12:25 AM   Modules accepted: Level of Service

## 2018-03-08 NOTE — Progress Notes (Signed)
Cardiology Office Note   Date:  03/09/2018   ID:  Sharon Bailey, DOB Sep 24, 1930, MRN [id number]  PCP:  Aurea Graff.Marlou Sa, MD  Cardiologist: Dr. Harrell Gave, MD  Chief Complaint  Patient presents with  . Follow-up  . Hypertension    History of Present Illness: Sharon Bailey is a 85 y.o. female who presents for HTN follow up, seen for Dr. Saunders Revel.   Sharon Bailey has a prior hx of hypertension, hyperlipidemia, hypothyroidism, and gout. Dr. Saunders Revel saw her May 2019, at which time she was feeling well.  She reported some symptomatic hypotension, including low energy and somnolence in the setting of systolic blood pressures between 110 and 120 mmHg. She noted that at other times, her blood pressure was quite high. There were no medication changes at that time, as her blood pressure was only mildly elevated and it was felt that a degree of permissive hypertension would be reasonable to prevent lightheadedness and falls. She had no further episodes of chest pain, which she had described at prior visits with Dr. Saunders Revel. It was discussed to defer further testing given normal Myoview in 08/2016.  She was last seen by Dr. Saunders Revel 08/2017 and had c/o esay bruising. Her PCP had stopped her ASA. She continued to have exertional dyspnea with modest activity, but this was unchanged form the prior year. Her BP's were noted to fluctuate with an average SBP around 140-152mHg. Her carvedilol was increased to 12.555mtwice daily due to hypertension as well as incidental PVC findings. Her spironolactone and lisinopril were continued. She had complaints of fatigue in which increased exercise was encouraged   Today she presents for follow up and her only complaint is some mild lower back pain. Her BP is quite elevated on presentation today with a reading of 164/110 and repeat of 165/95. She denies headache or palpitations. She states that she feels better with her BP mildly elevated rather than on the "low side" which she reports as  in the 110-120 SBP range. She thinks that her BP is elevated given the need to do an EKG and she is a little nervous. Appears to have some white coat syndrome. She denies chest pain or other anginal symptoms. We discussed that [facility]. Overall doing well. Continues to live alone and drive, but has family nearby.   Past Medical History:  Diagnosis Date  . Abnormal EKG   . Collagenous colitis   . Collagenous colitis   . Diverticulitis   . Diverticulosis   . Fluttering heart   . GERD (gastroesophageal reflux disease)   . Gout   . Gout attack 09/28/2012  . Hammer toe of left foot 09/28/2012  . Hyperlipidemia   . Hypertension   . Hypothyroidism   . Irregular heart beat   . Pain in joint, ankle and foot 09/28/2012  . Status post foot surgery 10/11/2012  . Thyroid disease     Past Surgical History:  Procedure Laterality Date  . ABDOMINAL HYSTERECTOMY    . ADENOIDECTOMY    . CHOLECYSTECTOMY    . Excision Ganglion Toe Left 09/29/2012   [place]. Hammer toe repair Left 09/29/2012   Lt #2 @ [facility]  . TONSILLECTOMY       Current Outpatient Medications  Medication Sig Dispense Refill  . allopurinol (ZYLOPRIM) 300 MG tablet Take 300  mg by mouth daily.    . budesonide (ENTOCORT EC) 3 MG 24 hr capsule Take 2 capsules (6 mg total) by mouth daily. 180 capsule 3  . carvedilol (COREG) 12.5 MG tablet Take 1 tablet (12.5 mg total) by mouth 2 (two) times daily. 180 tablet 3  . diclofenac sodium (VOLTAREN) 1 % GEL Apply 2 g topically 4 (four) times daily. (Patient taking differently: Apply 2 g topically 4 (four) times daily as needed. ) 1 Tube 5  . ibuprofen (ADVIL,MOTRIN) 200 MG tablet Take 200 mg by mouth every 6 (six) hours as needed (pain).     Marland Kitchen levothyroxine (SYNTHROID, LEVOTHROID) 100 MCG tablet Take 100 mcg by mouth daily before breakfast.     . lisinopril  (PRINIVIL,ZESTRIL) 40 MG tablet TAKE 1 TABLET BY MOUTH  DAILY 90 tablet 2  . loratadine (CLARITIN) 10 MG tablet Take 10 mg by mouth daily as needed for allergies.     . Multiple Vitamins-Minerals (MULTI FOR HER 50+) TABS Take 1 tablet by mouth daily.     . simvastatin (ZOCOR) 40 MG tablet Take 40 mg by mouth daily.     Marland Kitchen spironolactone (ALDACTONE) 25 MG tablet Take 25 mg by mouth daily.     No current facility-administered medications for this visit.     Allergies:   Sulfamethoxazole-trimethoprim   Social History:  The patient  reports that she has never smoked. She has never used smokeless tobacco. She reports that she does not drink alcohol or use drugs.   Family History:  The patient's family history includes Breast cancer in her mother; Heart disease in her sister; Lung disease in her brother; Memory loss in her father; Rheumatic fever in her sister.    ROS:  Please see the history of present illness. Otherwise, review of systems are positive for none. All other systems are reviewed and negative.    PHYSICAL EXAM: VS:  BP (!) 164/110   Pulse 80   Ht _0  (1.626 m)   Wt 168 lb (76.2 kg)   BMI 28.84 kg/m  , BMI Body mass index is 28.84 kg/m.  General: Well developed, well nourished, NAD Skin: Warm, dry, intact  Head: Normocephalic, atraumatic,  clear, moist mucus membranes. Neck: Negative for carotid bruits. No JVD Lungs:Clear to ausculation bilaterally. No wheezes, rales, or rhonchi. Breathing is unlabored. Cardiovascular: RRR with S1 S2. No murmurs, rubs, gallops, or LV heave appreciated. Abdomen: Soft, non-tender, non-distended  MSK: Strength and tone appear normal for age. 5/5 in all extremities Extremities: No edema. No clubbing or cyanosis. DP/PT pulses 2+ bilaterally Neuro: Alert and oriented. No focal deficits. No facial asymmetry. MAE spontaneously. Psych: Responds to questions appropriately with normal affect.     EKG:  EKG is ordered today. The ekg ordered  today demonstrates NSR with PVC, no change from prior tracing    Recent Labs: 04/15/2017: BUN 20; Creatinine, Ser 1.14; Potassium 4.0; Sodium 141    Lipid Panel No results found for: CHOL, TRIG, HDL, CHOLHDL, VLDL, LDLCALC, LDLDIRECT    Wt Readings from Last 3 Encounters:  03/09/18 168 lb (76.2 kg)  01/21/18 167 lb (75.8 kg)  08/19/17 165 lb 12.8 oz (75.2 kg)     Other studies Reviewed: Additional studies/ records that were reviewed today include:   Nuclear stress test 08/31/16 Normal pharmacologic nuclear stress test with no evidence for prior infarct or ischemia. Low risk. EF 69  48 hour Holter 04/30/16 NSR, rare PACs, single episode of PSVT  Echocardiogram 04/30/16  Mild concentric LVH, EF 55-60, normal wall motion, grade 1 diastolic dysfunction, MAC  ASSESSMENT AND PLAN:  1. HTN: -Elevated today, 164/110>>repeat of 165/95 -Carvedilol was increased at last office visit to 12.73m twice daily given persistently labile BP as well as incidental finding of PVC on EKG, now asymptomatic  -Will increase Carvedilol further to 270mtwice daily  -She has complaints that Spironolactone is making her urinate at least every two hours at night and she would like to come off of this  -I have decreased this to 12.32m80maily in hopes that she will continue to have some diuretic effect but will be less aggressive  -Continue current dose of lisinopril -Will obtain BMET today and see her back in 1-2 weeks for BP follow up in the HTN clinic for further titration  2. PVC's: -Incidental finding on EKG>>>no reports of palpitations  -EKG with one PVC>>no symptoms  -No further workup needed at this time  -Continue carvedilol   3. Frequent urination: -C/o of frequent nocturnal urination  -Will decrease dose to 12.32mg732mily and monitor BP response -No s/s of infection -Will increase carvedilol to 232mg57mce daily    Current medicines are reviewed at length with the patient today.  The patient  does not have concerns regarding medicines.  The following changes have been made:  Increase carvedilol to 232mg 36me daily and decrease spironolactone to 12.32mg da69m   Labs/ tests ordered today include: BMET today  No orders of the defined types were placed in this encounter.   Disposition:   FU with Padraic Marinos McKathyrn Drownn 1 week  Signed, Judy Pollman McKathyrn Drown/04/2018 11:41 AM    [facility] [id number] [id number] [phone]

## 2018-03-09 ENCOUNTER — Encounter: Payer: Self-pay | Admitting: Cardiology

## 2018-03-09 ENCOUNTER — Encounter (INDEPENDENT_AMBULATORY_CARE_PROVIDER_SITE_OTHER): Payer: Self-pay

## 2018-03-09 ENCOUNTER — Ambulatory Visit: Payer: Medicare Other | Admitting: Cardiology

## 2018-03-09 VITALS — BP 164/110 | HR 80 | Ht 64.0 in | Wt 168.0 lb

## 2018-03-09 DIAGNOSIS — I10 Essential (primary) hypertension: Secondary | ICD-10-CM | POA: Diagnosis not present

## 2018-03-09 DIAGNOSIS — E785 Hyperlipidemia, unspecified: Secondary | ICD-10-CM | POA: Diagnosis not present

## 2018-03-09 DIAGNOSIS — I493 Ventricular premature depolarization: Secondary | ICD-10-CM

## 2018-03-09 LAB — BASIC METABOLIC PANEL
BUN/Creatinine Ratio: 16 (ref 12–28)
BUN: 20 mg/dL (ref 8–27)
CALCIUM: 9.9 mg/dL (ref 8.7–10.3)
CO2: 20 mmol/L (ref 20–29)
CREATININE: 1.22 mg/dL — AB (ref 0.57–1.00)
Chloride: 106 mmol/L (ref 96–106)
GFR calc Af Amer: 47 mL/min/{1.73_m2} — ABNORMAL LOW (ref >59)
GFR, EST NON AFRICAN AMERICAN: 40 mL/min/{1.73_m2} — AB (ref >59)
GLUCOSE: 90 mg/dL (ref 65–99)
POTASSIUM: 4 mmol/L (ref 3.5–5.2)
SODIUM: 142 mmol/L (ref 134–144)

## 2018-03-09 MED ORDER — CARVEDILOL 25 MG PO TABS: 25.0000 mg | ORAL_TABLET | Freq: Every day | ORAL | 6 refills | Status: DC

## 2018-03-09 MED ORDER — SPIRONOLACTONE 25 MG PO TABS: 12.5000 mg | ORAL_TABLET | Freq: Every day | ORAL | 6 refills | Status: DC

## 2018-03-09 NOTE — Patient Instructions (Addendum)
Medication Instructions:   START TAKING CARVEDILOL 25 MG ONCE A DAY   START  TAKING  SPIRONOLACTONE 12.5 MG ONCE A DAY   If you need a refill on your cardiac medications before your next appointment, please call your pharmacy.   Lab work: BMET TODAY    If you have labs (blood work) drawn today and your tests are completely normal, you will receive your results only by: Marland Kitchen MyChart Message (if you have MyChart) OR . A paper copy in the mail If you have any lab test that is abnormal or we need to change your treatment, we will call you to review the results.  Testing/Procedures: NONE ORDERED  TODAY    Follow-Up: ONE WEEK WITH MCADANIEL ( 45 MIN SLOT)   At [facility], you and your health needs are our priority.  As part of our continuing mission to provide you with exceptional heart care, we have created designated Provider Care Teams.  These Care Teams include your primary Cardiologist (physician) and Advanced Practice Providers (APPs -  Physician Assistants and Nurse Practitioners) who all work together to provide you with the care you need, when you need it. You will need a follow up appointment in 6 months.  Please call our office 2 months in advance to schedule this appointment.  You may see Jodelle Red, MD or one of the following Advanced Practice Providers on your designated Care Team:   Theodore Demark, PA-C . Joni Reining, DNP, ANP  Any Other Special Instructions Will Be Listed Below (If Applicable).

## 2018-03-10 ENCOUNTER — Telehealth: Payer: Self-pay

## 2018-03-10 DIAGNOSIS — R7989 Other specified abnormal findings of blood chemistry: Secondary | ICD-10-CM

## 2018-03-10 NOTE — Telephone Encounter (Signed)
-----   Message from Filbert Schilder, NP sent at 03/10/2018 11:15 AM EST ----- Please let Sharon Bailey know that her kidney function is slightly elevated, but looks like it is close to her baseline. She could increase her water intake slightly to help with this. Could we get a repeat BMET when she comes back to the HTN clinic on 03/18?  Thank you Noreene Larsson

## 2018-03-10 NOTE — Telephone Encounter (Signed)
Notes recorded by Sigurd Sos, RN on 03/10/2018 at 11:21 AM EST The patient has been notified of the result and verbalized understanding. All questions (if any) were answered. Sigurd Sos, RN 03/10/2018 11:19 AM   ------

## 2018-03-21 ENCOUNTER — Ambulatory Visit (INDEPENDENT_AMBULATORY_CARE_PROVIDER_SITE_OTHER): Payer: Medicare Other | Admitting: Family Medicine

## 2018-03-21 ENCOUNTER — Encounter (INDEPENDENT_AMBULATORY_CARE_PROVIDER_SITE_OTHER): Payer: Self-pay | Admitting: Family Medicine

## 2018-03-21 ENCOUNTER — Other Ambulatory Visit: Payer: Self-pay

## 2018-03-21 ENCOUNTER — Telehealth: Payer: Self-pay

## 2018-03-21 VITALS — Temp 97.9°F

## 2018-03-21 DIAGNOSIS — G8929 Other chronic pain: Secondary | ICD-10-CM

## 2018-03-21 DIAGNOSIS — M545 Low back pain, unspecified: Secondary | ICD-10-CM

## 2018-03-21 MED ORDER — TRAMADOL HCL 50 MG PO TABS: 25.0000 mg | ORAL_TABLET | Freq: Two times a day (BID) | ORAL | 0 refills | Status: DC | PRN

## 2018-03-21 MED ORDER — METAXALONE 400 MG PO TABS: 400.0000 mg | ORAL_TABLET | Freq: Three times a day (TID) | ORAL | 3 refills | Status: DC | PRN

## 2018-03-21 NOTE — Telephone Encounter (Signed)
-----   Message from Awilda Metro, [facility] sent at 03/21/2018  9:44 AM EDT ----- Can move out blood pressure visit 2 weeks
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
## 2018-03-21 NOTE — Telephone Encounter (Signed)
Called pt and got her visits rescheduled for 04/06/18 for the labs and pharmd visit

## 2018-03-22 ENCOUNTER — Other Ambulatory Visit (INDEPENDENT_AMBULATORY_CARE_PROVIDER_SITE_OTHER): Payer: Self-pay | Admitting: Family Medicine

## 2018-03-22 ENCOUNTER — Telehealth (INDEPENDENT_AMBULATORY_CARE_PROVIDER_SITE_OTHER): Payer: Self-pay

## 2018-03-22 MED ORDER — TIZANIDINE HCL 2 MG PO TABS: 1.0000 mg | ORAL_TABLET | Freq: Four times a day (QID) | ORAL | 1 refills | Status: DC | PRN

## 2018-03-22 NOTE — Telephone Encounter (Signed)
We received a fax from [facility] [address] to let us know the generic Skelaxin is not covered by her insurance.  Dr. Prince Rome sent in Tizanidine to use instead. I called patient and advised her of the change.

## 2018-03-23 ENCOUNTER — Other Ambulatory Visit: Payer: Medicare Other

## 2018-03-23 ENCOUNTER — Ambulatory Visit: Payer: Medicare Other

## 2018-04-01 ENCOUNTER — Other Ambulatory Visit (INDEPENDENT_AMBULATORY_CARE_PROVIDER_SITE_OTHER): Payer: Self-pay | Admitting: Family Medicine

## 2018-04-01 MED ORDER — TIZANIDINE HCL 2 MG PO TABS: 1.0000 mg | ORAL_TABLET | Freq: Four times a day (QID) | ORAL | 1 refills | Status: DC | PRN

## 2018-04-06 ENCOUNTER — Ambulatory Visit: Payer: Medicare Other

## 2018-04-06 ENCOUNTER — Other Ambulatory Visit: Payer: Medicare Other

## 2018-04-12 ENCOUNTER — Telehealth: Payer: Self-pay | Admitting: Pharmacist

## 2018-04-12 ENCOUNTER — Telehealth: Payer: Self-pay | Admitting: Internal Medicine

## 2018-04-12 NOTE — Telephone Encounter (Signed)
The pt was advised of the recommendations and will call back if she does not continue to improve.

## 2018-04-12 NOTE — Telephone Encounter (Addendum)
Patient ID: Sharon Bailey                 DOB: 04/16/1930                      MRN: [id number]     HPI: Sharon Bailey is a 85 y.o. female a new patient of Dr. Di Kindlehristopher's (previously Dr. Okey DupreEnd) referred by Georgie ChardJill McDaniel to HTN clinic. PMH is significant for hypertension, hyperlipidemia, hypothyroidism, PVC and gout. Patient has a history of variable blood pressures and feeling poorly with pressures in the 110 and 120 systolic. At last visit her BP was elevated at 164/110. Patient stated she was nervous about her EKG. Carvedilol was increased to 25mg  twice and a day spironolactone was decreased to 12.5mg  daily due to complaints about urination at night.  Attempted to follow up with patient 2 weeks ago, but she had a bout of collits. Called patient today to see how she was doing. Patient states she feels much better. She denies dizziness, lightheadedness, blurred vision or headaches. She states she does still go to the bathroom frequently at night. She is taking her spironolactone in the AM, but she is drinking a lot of water in the evening. Advised to limit/eliminate fluids in the evening after dinner.   Patient states that her blood pressure varies a lot throughout the day. Upon review of blood pressure technique, patient is not resting 5 min prior to taking blood pressure and she is not keeping arm at heart level. She has a wrist cuff.  Current HTN meds: carvedilol 25mg  twice a day, lisinopril 40mg  daily, spironolactone 12.5mg  daily Previously tried: HCTZ BP goal: 130/80  Family History: The patient's family history includes Breast cancer in her mother; Heart disease in her sister; Lung disease in her brother; Memory loss in her father; Rheumatic fever in her sister.   Social History: The patient  reports that she has never smoked. She has never used smokeless tobacco. She reports that she does not drink alcohol or use drugs.   Diet: cooks most of meals-does use some salt  Exercise: problems  w/ back  Home BP readings: 126/82, 177/92  Wt Readings from Last 3 Encounters:  03/09/18 168 lb (76.2 kg)  01/21/18 167 lb (75.8 kg)  08/19/17 165 lb 12.8 oz (75.2 kg)   BP Readings from Last 3 Encounters:  03/09/18 (!) 164/110  02/09/18 (!) 166/88  01/21/18 (!) 148/88   Pulse Readings from Last 3 Encounters:  03/09/18 80  02/09/18 81  01/21/18 90    Renal function: CrCl cannot be calculated (Patient's most recent lab result is older than the maximum 21 days allowed.).  Past Medical History:  Diagnosis Date  . Abnormal EKG   . Collagenous colitis   . Collagenous colitis   . Diverticulitis   . Diverticulosis   . Fluttering heart   . GERD (gastroesophageal reflux disease)   . Gout   . Gout attack 09/28/2012  . Hammer toe of left foot 09/28/2012  . Hyperlipidemia   . Hypertension   . Hypothyroidism   . Irregular heart beat   . Pain in joint, ankle and foot 09/28/2012  . Status post foot surgery 10/11/2012  . Thyroid disease     Current Outpatient Medications on File Prior to Visit  Medication Sig Dispense Refill  . allopurinol (ZYLOPRIM) 300 MG tablet Take 300 mg by mouth daily.    . budesonide (ENTOCORT EC) 3 MG 24 hr capsule Take  2 capsules (6 mg total) by mouth daily. 180 capsule 3  . carvedilol (COREG) 25 MG tablet Take 1 tablet (25 mg total) by mouth daily. 60 tablet 6  . diclofenac sodium (VOLTAREN) 1 % GEL Apply 2 g topically 4 (four) times daily. (Patient taking differently: Apply 2 g topically 4 (four) times daily as needed. ) 1 Tube 5  . ibuprofen (ADVIL,MOTRIN) 200 MG tablet Take 200 mg by mouth every 6 (six) hours as needed (pain).     Marland Kitchen levothyroxine (SYNTHROID, LEVOTHROID) 100 MCG tablet Take 100 mcg by mouth daily before breakfast.     . levothyroxine (SYNTHROID, LEVOTHROID) 88 MCG tablet     . lisinopril (PRINIVIL,ZESTRIL) 40 MG tablet TAKE 1 TABLET BY MOUTH  DAILY 90 tablet 2  . loratadine (CLARITIN) 10 MG tablet Take 10 mg by mouth daily as needed for  allergies.     . metaxalone (SKELAXIN) 400 MG tablet Take 1 tablet (400 mg total) by mouth 3 (three) times daily as needed. 60 tablet 3  . Multiple Vitamins-Minerals (MULTI FOR HER 50+) TABS Take 1 tablet by mouth daily.     . simvastatin (ZOCOR) 40 MG tablet Take 40 mg by mouth daily.     Marland Kitchen spironolactone (ALDACTONE) 25 MG tablet Take 0.5 tablets (12.5 mg total) by mouth daily. 30 tablet 6  . tiZANidine (ZANAFLEX) 2 MG tablet Take 0.5-1 tablets (1-2 mg total) by mouth every 6 (six) hours as needed for muscle spasms. 60 tablet 1  . traMADol (ULTRAM) 50 MG tablet Take 0.5-1 tablets (25-50 mg total) by mouth 2 (two) times daily as needed. 30 tablet 0   No current facility-administered medications on file prior to visit.     Allergies  Allergen Reactions  . Sulfamethoxazole-Trimethoprim Diarrhea and Nausea Only     Assessment/Plan:  1. Hypertension - Unsure if blood pressure is at goal due to limited readings and poor BP technique. Reviewed proper way to take blood pressure, stressing the importance of resting at least 5 min prior to taking. Will continue current regimen of carvedilol 25mg  twice a day, lisinopril 40mg  daily, spironolactone 12.5mg  daily and will follow up with patient in clinic once able to see in office.   Thank you  Olene Floss, Pharm.D, BCPS [facility]  [address], [place], [place] [address]  Phone: [phone]; Fax: [phone]

## 2018-04-12 NOTE — Telephone Encounter (Signed)
Pt's son in law, Harvie Heck called to report that pt has been unable to keep food/liquid down.  He started that she has visited the bathroom 10-11 times during the middle of the night.  He reported that pt has taken 3 budesonides this morning.  Please call son back in mobile number given to advise.

## 2018-04-12 NOTE — Telephone Encounter (Signed)
Called patient to do telephone visit for HTN clinic. Patient daughter Frederico Hamman on [facility] answered phone. States that her mother is having a bout of colitis and her blood pressure is very low today. States he son took it this AM and it was 70/52. EMS took it about 15 min later and it was 100/70. Patient hasnt had a bowel movement in about 2 hr and is asleep now. Advised patient not to give BP medications today. Keep eye on BP and HR. If HR is >115 may give carvedilol. Would restart BP medications when BP is above 130/80. Reintroduce once at a time. Start with carvedilol, then lisinopril, then spironolactone since this is a diuretic and pt already loosing fluid with diarrhea. We will call patient in a week or two after she has recovered to do phone visit.

## 2018-04-12 NOTE — Telephone Encounter (Signed)
Back to 9 mg daily until things settle down, then 6 mg for a week, then back to 3 mg daily thereafter.

## 2018-04-12 NOTE — Telephone Encounter (Signed)
The pt has been on 1 budesonide 3 mg tab daily as prescribed by Dr Marina Goodell. She was instructed to decrease from 6 mg to 3 mg daily at 01/21/18 office visit.    She has done well until last night when she had 10-15 black watery stools overnight.  She also has some nausea.  She took 2 imodium this morning and 9 mg of budesonide.  Diarrhea has slowed somewhat.  She is drinking fluids and seems to feel some better.  Please advise as to what dose budesonide she should continue and any further recommendations.

## 2018-04-18 ENCOUNTER — Encounter (INDEPENDENT_AMBULATORY_CARE_PROVIDER_SITE_OTHER): Payer: Self-pay | Admitting: Physical Medicine and Rehabilitation

## 2018-05-11 ENCOUNTER — Ambulatory Visit: Payer: Medicare Other | Admitting: Podiatry

## 2018-05-11 ENCOUNTER — Encounter: Payer: Self-pay | Admitting: Podiatry

## 2018-05-11 ENCOUNTER — Other Ambulatory Visit: Payer: Self-pay

## 2018-05-11 VITALS — Temp 98.7°F

## 2018-05-11 DIAGNOSIS — I739 Peripheral vascular disease, unspecified: Secondary | ICD-10-CM

## 2018-05-11 DIAGNOSIS — B351 Tinea unguium: Secondary | ICD-10-CM | POA: Diagnosis not present

## 2018-05-11 DIAGNOSIS — M79674 Pain in right toe(s): Secondary | ICD-10-CM

## 2018-05-11 DIAGNOSIS — M79675 Pain in left toe(s): Secondary | ICD-10-CM

## 2018-05-11 NOTE — Patient Instructions (Signed)

## 2018-05-16 ENCOUNTER — Encounter: Payer: Self-pay | Admitting: Physical Medicine and Rehabilitation

## 2018-05-16 ENCOUNTER — Ambulatory Visit: Payer: Medicare Other | Admitting: Physical Medicine and Rehabilitation

## 2018-05-16 ENCOUNTER — Other Ambulatory Visit: Payer: Self-pay

## 2018-05-16 ENCOUNTER — Ambulatory Visit: Payer: Self-pay

## 2018-05-16 VITALS — BP 160/86 | HR 92 | Temp 97.8°F

## 2018-05-16 DIAGNOSIS — M47816 Spondylosis without myelopathy or radiculopathy, lumbar region: Secondary | ICD-10-CM | POA: Diagnosis not present

## 2018-05-16 MED ORDER — METHYLPREDNISOLONE ACETATE 80 MG/ML IJ SUSP: 80.0000 mg | Freq: Once | INTRAMUSCULAR | Status: DC

## 2018-05-16 NOTE — Progress Notes (Signed)
 .  Numeric Pain Rating Scale and Functional Assessment Average Pain 9   In the last MONTH (on 0-10 scale) has pain interfered with the following?  1. General activity like being  able to carry out your everyday physical activities such as walking, climbing stairs, carrying groceries, or moving a chair?  Rating(9)   +Driver, -BT, -Dye Allergies.  

## 2018-05-19 ENCOUNTER — Encounter: Payer: Self-pay | Admitting: Podiatry

## 2018-05-19 NOTE — Progress Notes (Signed)
Subjective: Sharon Bailey presents today with painful, thick toenails 1-5 b/l that she cannot cut and which interfere with daily activities.  Pain is aggravated when wearing enclosed shoe gear.  Patient is also seen for corn distal tip right 2nd digit.  She voices no new pedal concerns on today's visit.  Mitchell, L.August Saucerean, MD is her PCP and last visit was December, 2019.   Current Outpatient Medications:  .  allopurinol (ZYLOPRIM) 300 MG tablet, Take 300 mg by mouth daily., Disp: , Rfl:  .  budesonide (ENTOCORT EC) 3 MG 24 hr capsule, Take 2 capsules (6 mg total) by mouth daily., Disp: 180 capsule, Rfl: 3 .  carvedilol (COREG) 25 MG tablet, Take 1 tablet (25 mg total) by mouth daily., Disp: 60 tablet, Rfl: 6 .  diclofenac sodium (VOLTAREN) 1 % GEL, Apply 2 g topically 4 (four) times daily. (Patient taking differently: Apply 2 g topically 4 (four) times daily as needed. ), Disp: 1 Tube, Rfl: 5 .  ibuprofen (ADVIL,MOTRIN) 200 MG tablet, Take 200 mg by mouth every 6 (six) hours as needed (pain). , Disp: , Rfl:  .  levothyroxine (SYNTHROID, LEVOTHROID) 100 MCG tablet, Take 100 mcg by mouth daily before breakfast. , Disp: , Rfl:  .  levothyroxine (SYNTHROID, LEVOTHROID) 88 MCG tablet, , Disp: , Rfl:  .  lisinopril (PRINIVIL,ZESTRIL) 40 MG tablet, TAKE 1 TABLET BY MOUTH  DAILY, Disp: 90 tablet, Rfl: 2 .  loratadine (CLARITIN) 10 MG tablet, Take 10 mg by mouth daily as needed for allergies. , Disp: , Rfl:  .  metaxalone (SKELAXIN) 400 MG tablet, Take 1 tablet (400 mg total) by mouth 3 (three) times daily as needed., Disp: 60 tablet, Rfl: 3 .  Multiple Vitamins-Minerals (MULTI FOR HER 50+) TABS, Take 1 tablet by mouth daily. , Disp: , Rfl:  .  simvastatin (ZOCOR) 40 MG tablet, Take 40 mg by mouth daily. , Disp: , Rfl:  .  spironolactone (ALDACTONE) 25 MG tablet, Take 0.5 tablets (12.5 mg total) by mouth daily., Disp: 30 tablet, Rfl: 6 .  tiZANidine (ZANAFLEX) 2 MG tablet, Take 0.5-1 tablets (1-2 mg  total) by mouth every 6 (six) hours as needed for muscle spasms., Disp: 60 tablet, Rfl: 1 .  traMADol (ULTRAM) 50 MG tablet, Take 0.5-1 tablets (25-50 mg total) by mouth 2 (two) times daily as needed., Disp: 30 tablet, Rfl: 0  Current Facility-Administered Medications:  .  methylPREDNISolone acetate (DEPO-MEDROL) injection 80 mg, 80 mg, Other, Once, Tyrell AntonioNewton, Frederic, MD  Allergies  Allergen Reactions  . Sulfamethoxazole-Trimethoprim Diarrhea and Nausea Only    Objective:  Vascular Examination: Capillary refill time immediate x 10 digits.  Dorsalis pedis palpable b/l.  Posterior tibial pulses nonpalpable b/l  Digital hair absent x 10 digits.  Skin temperature gradient WNL b/l.  Dermatological Examination: Skin thin and atrophic b/l.  Toenails 1-5 b/l discolored, thick, dystrophic with subungual debris and pain with palpation to nailbeds due to thickness of nails.  Hyperkeratotic lesion resolved distal tip right 2nd digit with visible subdermal hemorrhage. No erythema, no edema,  No drainage, no flocculence, no warmth.  Musculoskeletal: Muscle strength 5/5 to all LE muscle groups.  Hammertoes 2-4 b/l.  Short 3rd digit left foot.  No pain, crepitus or joint limitation noted with ROM.   Neurological: Sensation intact with 10 gram monofilament.  Vibratory sensation intact.  Assessment: 1.  Painful onychomycosis toenails 1-5 b/l  2.  PAD  Plan: 1. Toenails 1-5 b/l were debrided in length and girth  without iatrogenic bleeding. 2. Patient to continue soft, supportive shoe gear daily. 3. Patient to report any pedal injuries to medical professional immediately. 4. Follow up 3 months.  5. Patient/POA to call should there be a concern in the interim.

## 2018-05-19 NOTE — Procedures (Signed)
Lumbar Facet Joint Intra-Articular Injection(s) with Fluoroscopic Guidance  Patient: Sharon Bailey      Date of Birth: [birth year] MRN: [id number] PCP: Clovis Riley, L.August Saucer, MD      Visit Date: 05/16/2018   Universal Protocol:    Date/Time: 05/16/2018  Consent Given By: the patient  Position: PRONE   Additional Comments: Vital signs were monitored before and after the procedure. Patient was prepped and draped in the usual sterile fashion. The correct patient, procedure, and site was verified.   Injection Procedure Details:  Procedure Site One Meds Administered:  Meds ordered this encounter  Medications  . methylPREDNISolone acetate (DEPO-MEDROL) injection 80 mg     Laterality: Left  Location/Site:  L4-L5 L5-S1  Needle size: 22 guage  Needle type: Spinal  Needle Placement: Articular  Findings:  -Comments: Excellent flow of contrast producing a partial arthrogram.  Procedure Details: The fluoroscope beam is vertically oriented in AP, and the inferior recess is visualized beneath the lower pole of the inferior apophyseal process, which represents the target point for needle insertion. When direct visualization is difficult the target point is located at the medial projection of the vertebral pedicle. The region overlying each aforementioned target is locally anesthetized with a 1 to 2 ml. volume of 1% Lidocaine without Epinephrine.   The spinal needle was inserted into each of the above mentioned facet joints using biplanar fluoroscopic guidance. A 0.25 to 0.5 ml. volume of Isovue-250 was injected and a partial facet joint arthrogram was obtained. A single spot film was obtained of the resulting arthrogram.    One to 1.25 ml of the steroid/anesthetic solution was then injected into each of the facet joints noted above.   Additional Comments:  The patient tolerated the procedure well Dressing: 2 x 2 sterile gauze and Band-Aid    Post-procedure details: Patient was  observed during the procedure. Post-procedure instructions were reviewed.  Patient left the clinic in stable condition.
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
## 2018-06-29 ENCOUNTER — Other Ambulatory Visit (INDEPENDENT_AMBULATORY_CARE_PROVIDER_SITE_OTHER): Payer: Self-pay | Admitting: Family Medicine

## 2018-06-30 ENCOUNTER — Other Ambulatory Visit: Payer: Self-pay

## 2018-06-30 ENCOUNTER — Encounter: Payer: Self-pay | Admitting: Family Medicine

## 2018-06-30 ENCOUNTER — Ambulatory Visit (INDEPENDENT_AMBULATORY_CARE_PROVIDER_SITE_OTHER): Payer: Medicare Other | Admitting: Family Medicine

## 2018-06-30 DIAGNOSIS — M545 Low back pain: Secondary | ICD-10-CM

## 2018-06-30 DIAGNOSIS — M25511 Pain in right shoulder: Secondary | ICD-10-CM

## 2018-06-30 DIAGNOSIS — G8929 Other chronic pain: Secondary | ICD-10-CM | POA: Diagnosis not present

## 2018-06-30 MED ORDER — TRAMADOL HCL 50 MG PO TABS: 50.0000 mg | ORAL_TABLET | Freq: Four times a day (QID) | ORAL | 0 refills | Status: DC | PRN

## 2018-06-30 NOTE — Progress Notes (Signed)
Office Visit Note   Patient: Sharon Bailey           Date of Birth: Jul 11, 1930           MRN: [id number] Visit Date: 06/30/2018 Requested by: Alroy Dust, L.Marlou Sa, [place] [employer] [place],  [place] [address] PCP: Alroy Dust, L.Marlou Sa, MD  Subjective: Chief Complaint  Patient presents with  . Right Shoulder - Pain    Pain in shoulder x 3 weeks. NKI. Decreased ROM due to pain. Cannot sleep on right side due to pain. Right-hand dominant.    HPI: She is here with right shoulder pain.  Symptoms started about 3 weeks ago, no injury.  Pain on the posterior aspect when reaching overhead or behind the back.  She is also having persistent low back pain.  Not much relief after her first lumbar epidural injection.  She would like to try another.                ROS: No fevers or chills.  All other systems were reviewed and are negative.  Objective: Vital Signs: There were no vitals taken for this visit.  Physical Exam:  General:  Alert and oriented, in no acute distress. Pulm:  Breathing unlabored. Psy:  Normal mood, congruent affect. Skin: No rash on her skin. Right shoulder: Decreased overhead reach due to pain but no adhesive capsulitis.  Pain with empty can test but still good rotator cuff strength.  Tender in the posterior subacromial space and tender myofascial trigger points overlying the scapula.  Imaging: None today.  Assessment & Plan: 1.  Right shoulder pain, suspect impingement/bursitis. -Discussed options with her and she would like to try cortisone injection.  Follow-up as needed.  2.  Chronic low back pain - Referral for another epidural injection.     Procedures: Right shoulder subacromial injection: After sterile prep with Betadine, injected 5 cc 1% lidocaine without epinephrine and 40 mg methylprednisolone from posterior approach into subacromial space.    PMFS History: Patient Active Problem List   Diagnosis Date Noted  . Low back pain 09/21/2017   . Chronic fatigue 02/05/2017  . Atypical chest pain 02/05/2017  . Hyperlipidemia 02/05/2017  . Palpitations 04/18/2016  . Essential hypertension 04/18/2016  . Hypertensive heart disease without heart failure 04/18/2016  . Status post foot surgery 10/11/2012  . Gout attack 09/28/2012  . Hammer toe of left foot 09/28/2012  . Pain in joint, ankle and foot 09/28/2012   Past Medical History:  Diagnosis Date  . Abnormal EKG   . Collagenous colitis   . Collagenous colitis   . Diverticulitis   . Diverticulosis   . Fluttering heart   . GERD (gastroesophageal reflux disease)   . Gout   . Gout attack 09/28/2012  . Hammer toe of left foot 09/28/2012  . Hyperlipidemia   . Hypertension   . Hypothyroidism   . Irregular heart beat   . Pain in joint, ankle and foot 09/28/2012  . Status post foot surgery 10/11/2012  . Thyroid disease     Family History  Problem Relation Age of Onset  . Breast cancer Mother   . Memory loss Father   . Heart disease Sister   . Rheumatic fever Sister   . Lung disease Brother   . Colon cancer Neg Hx     Past Surgical History:  Procedure Laterality Date  . ABDOMINAL HYSTERECTOMY    . ADENOIDECTOMY    . CHOLECYSTECTOMY    . Excision Ganglion  Toe Left 09/29/2012   Lt #2 @ [facility]  . Hammer toe repair Left 09/29/2012   Lt #2 @ [facility]  . TONSILLECTOMY     Social History   Occupational History  . Not on file  Tobacco Use  . Smoking status: Never Smoker  . Smokeless tobacco: Never Used  Substance and Sexual Activity  . Alcohol use: No    Alcohol/week: 0.0 standard drinks  . Drug use: No  . Sexual activity: Not on file

## 2018-07-13 ENCOUNTER — Other Ambulatory Visit (INDEPENDENT_AMBULATORY_CARE_PROVIDER_SITE_OTHER): Payer: Self-pay | Admitting: Orthopaedic Surgery

## 2018-07-19 ENCOUNTER — Other Ambulatory Visit: Payer: Self-pay

## 2018-07-19 ENCOUNTER — Ambulatory Visit (INDEPENDENT_AMBULATORY_CARE_PROVIDER_SITE_OTHER): Payer: Medicare Other | Admitting: Physical Medicine and Rehabilitation

## 2018-07-19 ENCOUNTER — Ambulatory Visit: Payer: Self-pay

## 2018-07-19 ENCOUNTER — Encounter: Payer: Self-pay | Admitting: Physical Medicine and Rehabilitation

## 2018-07-19 VITALS — BP 140/85 | HR 77

## 2018-07-19 DIAGNOSIS — M5416 Radiculopathy, lumbar region: Secondary | ICD-10-CM

## 2018-07-19 MED ORDER — METHYLPREDNISOLONE ACETATE 80 MG/ML IJ SUSP
80.0000 mg | Freq: Once | INTRAMUSCULAR | Status: AC
  Administered 2018-07-19: 15:00:00 80 mg

## 2018-07-19 NOTE — Progress Notes (Signed)
.     Numeric Pain Rating Scale and Functional Assessment Average Pain 10   In the last MONTH (on 0-10 scale) has pain interfered with the following?  1. General activity like being  able to carry out your everyday physical activities such as walking, climbing stairs, carrying groceries, or moving a chair?  Rating(7)   +Driver, -BT, -Dye Allergies.  

## 2018-07-21 NOTE — Progress Notes (Signed)
Sharon Bailey - 85 y.o. female MRN [id number]  Date of birth: [birth year]  Office Visit Note: Visit Date: 07/19/2018 PCP: Alroy Dust, L.Marlou Sa, MD Referred by: Alroy Dust, L.Marlou Sa, MD  Subjective: Chief Complaint  Patient presents with  . Lower Back - Pain   HPI:  Sharon Bailey is a 85 y.o. female who comes in today For planned L5-S1 interlaminar epidural steroid injection at the request of Dr. Legrand Como Hilts.  We saw the patient in May and completed facet joint injections diagnostically without much relief at all.  Patient does not have any red flag complaints of focal weakness or paresthesias or specific trauma but MRI of the lumbar spine has not been performed.  I do think it is okay to complete an epidural injection to see if it gets rid of her symptoms to a good degree diagnostically.  She reports again mostly back pain left side into the left buttocks.  Standing for a long period of time does make it worse and sitting makes it better.  She rates her pain as a 10 out of 10.  I think she probably has some level of lumbar stenosis probably at L4-5 given percentages.  Depending on relief with injection would order MRI of the lumbar spine or have Dr. Junius Roads look at that.  ROS Otherwise per HPI.  Assessment & Plan: Visit Diagnoses:  1. Lumbar radiculopathy     Plan: No additional findings.   Meds & Orders:  Meds ordered this encounter  Medications  . methylPREDNISolone acetate (DEPO-MEDROL) injection 80 mg    Orders Placed This Encounter  Procedures  . XR C-ARM NO REPORT  . Epidural Steroid injection    Follow-up: Return for ? MRI.   Procedures: No procedures performed  Lumbar Epidural Steroid Injection - Interlaminar Approach with Fluoroscopic Guidance  Patient: Sharon Bailey      Date of Birth: 16-Apr-1930 MRN: [id number] PCP: Alroy Dust, L.Marlou Sa, MD      Visit Date: 07/19/2018   Universal Protocol:     Consent Given By: the patient  Position: PRONE  Additional Comments:  Vital signs were monitored before and after the procedure. Patient was prepped and draped in the usual sterile fashion. The correct patient, procedure, and site was verified.   Injection Procedure Details:  Procedure Site One Meds Administered:  Meds ordered this encounter  Medications  . methylPREDNISolone acetate (DEPO-MEDROL) injection 80 mg     Laterality: Left  Location/Site:  L5-S1  Needle size: 20 G  Needle type: Tuohy  Needle Placement: Paramedian epidural  Findings:   -Comments: Excellent flow of contrast into the epidural space.  Procedure Details: Using a paramedian approach from the side mentioned above, the region overlying the inferior lamina was localized under fluoroscopic visualization and the soft tissues overlying this structure were infiltrated with 4 ml. of 1% Lidocaine without Epinephrine. The Tuohy needle was inserted into the epidural space using a paramedian approach.   The epidural space was localized using loss of resistance along with lateral and bi-planar fluoroscopic views.  After negative aspirate for air, blood, and CSF, a 2 ml. volume of Isovue-250 was injected into the epidural space and the flow of contrast was observed. Radiographs were obtained for documentation purposes.    The injectate was administered into the level noted above.   Additional Comments:  The patient tolerated the procedure well Dressing: 2 x 2 sterile gauze and Band-Aid    Post-procedure details: Patient was observed during the procedure. Post-procedure instructions were reviewed.  Patient left the clinic in stable condition.   Clinical History: No specialty comments available.     Objective:  VS:  HT:    WT:   BMI:     BP:140/85  HR:77bpm  TEMP: ( )  RESP:  Physical Exam  Ortho Exam Imaging: No results found.

## 2018-07-21 NOTE — Procedures (Signed)
Lumbar Epidural Steroid Injection - Interlaminar Approach with Fluoroscopic Guidance  Patient: Sharon Bailey      Date of Birth: July 03, 1930 MRN: [id number] PCP: Alroy Dust, L.Marlou Sa, MD      Visit Date: 07/19/2018   Universal Protocol:     Consent Given By: the patient  Position: PRONE  Additional Comments: Vital signs were monitored before and after the procedure. Patient was prepped and draped in the usual sterile fashion. The correct patient, procedure, and site was verified.   Injection Procedure Details:  Procedure Site One Meds Administered:  Meds ordered this encounter  Medications  . methylPREDNISolone acetate (DEPO-MEDROL) injection 80 mg     Laterality: Left  Location/Site:  L5-S1  Needle size: 20 G  Needle type: Tuohy  Needle Placement: Paramedian epidural  Findings:   -Comments: Excellent flow of contrast into the epidural space.  Procedure Details: Using a paramedian approach from the side mentioned above, the region overlying the inferior lamina was localized under fluoroscopic visualization and the soft tissues overlying this structure were infiltrated with 4 ml. of 1% Lidocaine without Epinephrine. The Tuohy needle was inserted into the epidural space using a paramedian approach.   The epidural space was localized using loss of resistance along with lateral and bi-planar fluoroscopic views.  After negative aspirate for air, blood, and CSF, a 2 ml. volume of Isovue-250 was injected into the epidural space and the flow of contrast was observed. Radiographs were obtained for documentation purposes.    The injectate was administered into the level noted above.   Additional Comments:  The patient tolerated the procedure well Dressing: 2 x 2 sterile gauze and Band-Aid    Post-procedure details: Patient was observed during the procedure. Post-procedure instructions were reviewed.  Patient left the clinic in stable condition.

## 2018-08-10 ENCOUNTER — Other Ambulatory Visit: Payer: Self-pay

## 2018-08-10 ENCOUNTER — Encounter: Payer: Self-pay | Admitting: Podiatry

## 2018-08-10 ENCOUNTER — Ambulatory Visit: Payer: Medicare Other | Admitting: Podiatry

## 2018-08-10 DIAGNOSIS — M79674 Pain in right toe(s): Secondary | ICD-10-CM | POA: Diagnosis not present

## 2018-08-10 DIAGNOSIS — I739 Peripheral vascular disease, unspecified: Secondary | ICD-10-CM | POA: Diagnosis not present

## 2018-08-10 DIAGNOSIS — B351 Tinea unguium: Secondary | ICD-10-CM

## 2018-08-10 DIAGNOSIS — M79675 Pain in left toe(s): Secondary | ICD-10-CM

## 2018-08-10 NOTE — Patient Instructions (Signed)

## 2018-08-14 NOTE — Progress Notes (Signed)
Subjective:  SHARLEEN SZCZESNY presents to clinic today with cc of  painful, thick, discolored, elongated toenails 1-5 b/l that become tender and cannot cut because of thickness.   Current Outpatient Medications:  .  allopurinol (ZYLOPRIM) 300 MG tablet, Take 300 mg by mouth daily., Disp: , Rfl:  .  budesonide (ENTOCORT EC) 3 MG 24 hr capsule, Take 2 capsules (6 mg total) by mouth daily., Disp: 180 capsule, Rfl: 3 .  carvedilol (COREG) 25 MG tablet, Take 1 tablet (25 mg total) by mouth daily., Disp: 60 tablet, Rfl: 6 .  diclofenac sodium (VOLTAREN) 1 % GEL, APPLY 2 GRAMS TO AFFECTED AREA 4 TIMES A DAY, Disp: 100 g, Rfl: 5 .  ibuprofen (ADVIL,MOTRIN) 200 MG tablet, Take 200 mg by mouth every 6 (six) hours as needed (pain). , Disp: , Rfl:  .  ketoconazole (NIZORAL) 2 % cream, APPLY TO AFFECTED AREA EVERY DAY, Disp: , Rfl:  .  levothyroxine (SYNTHROID, LEVOTHROID) 100 MCG tablet, Take 100 mcg by mouth daily before breakfast. , Disp: , Rfl:  .  levothyroxine (SYNTHROID, LEVOTHROID) 88 MCG tablet, , Disp: , Rfl:  .  lisinopril (PRINIVIL,ZESTRIL) 40 MG tablet, TAKE 1 TABLET BY MOUTH  DAILY, Disp: 90 tablet, Rfl: 2 .  loratadine (CLARITIN) 10 MG tablet, Take 10 mg by mouth daily as needed for allergies. , Disp: , Rfl:  .  Multiple Vitamins-Minerals (MULTI FOR HER 50+) TABS, Take 1 tablet by mouth daily. , Disp: , Rfl:  .  simvastatin (ZOCOR) 40 MG tablet, Take 40 mg by mouth daily. , Disp: , Rfl:  .  spironolactone (ALDACTONE) 25 MG tablet, Take 0.5 tablets (12.5 mg total) by mouth daily., Disp: 30 tablet, Rfl: 6 .  tiZANidine (ZANAFLEX) 2 MG tablet, Take 0.5-1 tablets (1-2 mg total) by mouth every 6 (six) hours as needed for muscle spasms., Disp: 60 tablet, Rfl: 1 .  traMADol (ULTRAM) 50 MG tablet, Take 0.5-1 tablets (25-50 mg total) by mouth 2 (two) times daily as needed., Disp: 30 tablet, Rfl: 0 .  traMADol (ULTRAM) 50 MG tablet, Take 1 tablet (50 mg total) by mouth every 6 (six) hours as needed.,  Disp: 30 tablet, Rfl: 0  Current Facility-Administered Medications:  .  methylPREDNISolone acetate (DEPO-MEDROL) injection 80 mg, 80 mg, Other, Once, Magnus Sinning, MD   Allergies  Allergen Reactions  . Sulfamethoxazole-Trimethoprim Diarrhea and Nausea Only     Objective: Physical Examination:  Vascular Examination: Capillary refill time immediate x 10 digits.  Palpable DP pulses b/l.  PT pulses nonpalpable b/l.  Digital hair absent b/l.  No edema noted b/l.  Skin temperature gradient WNL b/l.  Dermatological Examination: Skin thin and atrophic b/l.  No open wounds b/l.  No interdigital macerations noted b/l.  Elongated, thick, discolored brittle toenails with subungual debris and pain on dorsal palpation of nailbeds 1-5 b/l.  Musculoskeletal Examination: Muscle strength 5/5 to all muscle groups b/l.  Hammertoes 2-4 b/l.  Short 3rd digit left foot.  No pain, crepitus or joint discomfort with active/passive ROM.  Neurological Examination: Sensation intact 5/5 b/l with 10 gram monofilament.  Vibratory sensation intact b/l.  Assessment: Mycotic nail infection with pain 1-5 b/l PAD  Plan: 1. Toenails 1-5 b/l were debrided in length and girth without iatrogenic laceration. 2.  Continue soft, supportive shoe gear daily. 3.  Report any pedal injuries to medical professional. 4.  Follow up 3 months. 5.  Patient/POA to call should there be a question/concern in there interim.

## 2018-08-26 ENCOUNTER — Ambulatory Visit (INDEPENDENT_AMBULATORY_CARE_PROVIDER_SITE_OTHER): Payer: Medicare Other | Admitting: Cardiology

## 2018-08-26 ENCOUNTER — Other Ambulatory Visit: Payer: Self-pay

## 2018-08-26 ENCOUNTER — Encounter: Payer: Self-pay | Admitting: Cardiology

## 2018-08-26 VITALS — BP 142/92 | HR 75 | Ht 64.0 in | Wt 160.0 lb

## 2018-08-26 DIAGNOSIS — Z7189 Other specified counseling: Secondary | ICD-10-CM

## 2018-08-26 DIAGNOSIS — R5382 Chronic fatigue, unspecified: Secondary | ICD-10-CM

## 2018-08-26 DIAGNOSIS — I10 Essential (primary) hypertension: Secondary | ICD-10-CM | POA: Diagnosis not present

## 2018-08-26 DIAGNOSIS — Z79899 Other long term (current) drug therapy: Secondary | ICD-10-CM

## 2018-08-26 MED ORDER — SIMVASTATIN 20 MG PO TABS: 20.0000 mg | ORAL_TABLET | Freq: Every day | ORAL | 11 refills | Status: DC

## 2018-08-26 MED ORDER — AMLODIPINE BESYLATE 2.5 MG PO TABS: 2.5000 mg | ORAL_TABLET | Freq: Every day | ORAL | 11 refills | Status: DC

## 2018-08-26 NOTE — Patient Instructions (Signed)
Medication Instructions:  Stop: Spironolactone 12.5 mg Decrease Simvastatin to 20 mg daily Start: Amlodipine 2.5 mg daily  If you need a refill on your cardiac medications before your next appointment, please call your pharmacy.   Lab work: None  Testing/Procedures: None  Follow-Up: At [facility], you and your health needs are our priority.  As part of our continuing mission to provide you with exceptional heart care, we have created designated Provider Care Teams.  These Care Teams include your primary Cardiologist (physician) and Advanced Practice Providers (APPs -  Physician Assistants and Nurse Practitioners) who all work together to provide you with the care you need, when you need it. You will need a follow up appointment in 6 months.  Please call our office 2 months in advance to schedule this appointment.  You may see Buford Dresser, MD or one of the following Advanced Practice Providers on your designated Care Team:   Rosaria Ferries, PA-C . Jory Sims, DNP, ANP   Amlodipine tablets What is this medicine? AMLODIPINE (am LOE di peen) is a calcium-channel blocker. It affects the amount of calcium found in your heart and muscle cells. This relaxes your blood vessels, which can reduce the amount of work the heart has to do. This medicine is used to lower high blood pressure. It is also used to prevent chest pain. This medicine may be used for other purposes; ask your health care provider or pharmacist if you have questions. COMMON BRAND NAME(S): Norvasc What should I tell my health care provider before I take this medicine? They need to know if you have any of these conditions:  heart disease  liver disease  an unusual or allergic reaction to amlodipine, other medicines, foods, dyes, or preservatives  pregnant or trying to get pregnant  breast-feeding How should I use this medicine? Take this medicine by mouth with a glass of water. Follow the directions on  the prescription label. You can take it with or without food. If it upsets your stomach, take it with food. Take your medicine at regular intervals. Do not take it more often than directed. Do not stop taking except on your doctor's advice. Talk to your pediatrician regarding the use of this medicine in children. While this drug may be prescribed for children as young as 6 years for selected conditions, precautions do apply. Patients over 60 years of age may have a stronger reaction and need a smaller dose. Overdosage: If you think you have taken too much of this medicine contact a poison control center or emergency room at once. NOTE: This medicine is only for you. Do not share this medicine with others. What if I miss a dose? If you miss a dose, take it as soon as you can. If it is almost time for your next dose, take only that dose. Do not take double or extra doses. What may interact with this medicine? Do not take this medicine with any of the following medications:  tranylcypromine This medicine may also interact with the following medications:  clarithromycin  cyclosporine  diltiazem  itraconazole  simvastatin  tacrolimus This list may not describe all possible interactions. Give your health care provider a list of all the medicines, herbs, non-prescription drugs, or dietary supplements you use. Also tell them if you smoke, drink alcohol, or use illegal drugs. Some items may interact with your medicine. What should I watch for while using this medicine? Visit your healthcare professional for regular checks on your progress. Check your  blood pressure as directed. Ask your healthcare professional what your blood pressure should be and when you should contact him or her. Do not treat yourself for coughs, colds, or pain while you are using this medicine without asking your healthcare professional for advice. Some medicines may increase your blood pressure. You may get dizzy. Do not  drive, use machinery, or do anything that needs mental alertness until you know how this medicine affects you. Do not stand or sit up quickly, especially if you are an older patient. This reduces the risk of dizzy or fainting spells. Avoid alcoholic drinks; they can make you dizzier. What side effects may I notice from receiving this medicine? Side effects that you should report to your doctor or health care professional as soon as possible:  allergic reactions like skin rash, itching or hives; swelling of the face, lips, or tongue  fast, irregular heartbeat  signs and symptoms of low blood pressure like dizziness; feeling faint or lightheaded, falls; unusually weak or tired  swelling of ankles, feet, hands Side effects that usually do not require medical attention (report these to your doctor or health care professional if they continue or are bothersome):  dry mouth  facial flushing  headache  stomach pain  tiredness This list may not describe all possible side effects. Call your doctor for medical advice about side effects. You may report side effects to FDA at 1-800-FDA-1088. Where should I keep my medicine? Keep out of the reach of children. Store at room temperature between 59 and 86 degrees F (15 and 30 degrees C). Throw away any unused medicine after the expiration date. NOTE: This sheet is a summary. It may not cover all possible information. If you have questions about this medicine, talk to your doctor, pharmacist, or health care provider.  2020 Elsevier/Gold Standard (2017-07-16 15:07:10)

## 2018-08-26 NOTE — Progress Notes (Signed)
Cardiology Office Note:    Date:  08/26/2018   ID:  Sharon Bailey, DOB 1930/07/27, MRN [id number]  PCP:  Asencion GowdaMitchell, L.August Saucerean, MD  Cardiologist:  Jodelle RedBridgette Zamaria Brazzle, MD PhD (previously Dr. Okey DupreEnd)  Referring MD: Clovis RileyMitchell, Elbert EwingsL.August Saucerean, MD   CC: follow up hypertension  History of Present Illness:    Sharon Bailey is a 85 y.o. female with a hx of hypertension, hyperlipidemia, and gout who was previously seen by Dr. Okey DupreEnd for hypertension.  Patient concerns today (here with daughter): overall feels like she is doing well, except for tiring easily. Does have chronic back pain. Doesn't sleep well, feels like she is urinating constantly. Wants to try to cut back on any medications that make her go to the restroom. Many meds on her list she is not taking. From a cardiac perspective, she IS taking carvedilol, lisinopril, and simvastatin. Is NOT taking spironolactone.  Gets tired even grocery shopping, has to sit down. Can do light housework, driving, etc. Feels like it has gotten worse as her back pain has gotten worse. Has been progressive. No chest pain or other associated symptoms.   Has wrist BP cuff but is highly variable. Doesn't check every day. Can feel when her BP is low. Weights have been stable.  Denies chest pain, shortness of breath at rest or with normal exertion. No PND, orthopnea, LE edema or unexpected weight gain. No syncope or palpitations.  We spent time today discussing her medications, options for hypertension management, pros/cons of different classes of medications. Reviewed amlodipine in depth. Also discussed guidelines re: statins in elderly, interaction between simvastatin and amlodipine. Summarized below.   Past Medical History:  Diagnosis Date  . Abnormal EKG   . Collagenous colitis   . Collagenous colitis   . Diverticulitis   . Diverticulosis   . Fluttering heart   . GERD (gastroesophageal reflux disease)   . Gout   . Gout attack 09/28/2012  . Hammer toe of left foot  09/28/2012  . Hyperlipidemia   . Hypertension   . Hypothyroidism   . Irregular heart beat   . Pain in joint, ankle and foot 09/28/2012  . Status post foot surgery 10/11/2012  . Thyroid disease     Past Surgical History:  Procedure Laterality Date  . ABDOMINAL HYSTERECTOMY    . ADENOIDECTOMY    . CHOLECYSTECTOMY    . Excision Ganglion Toe Left 09/29/2012   Lt #2 @ [facility]  . Hammer toe repair Left 09/29/2012   Lt #2 @ [facility]  . TONSILLECTOMY      Current Medications: Current Outpatient Medications on File Prior to Visit  Medication Sig  . allopurinol (ZYLOPRIM) 300 MG tablet Take 300 mg by mouth daily.  . budesonide (ENTOCORT EC) 3 MG 24 hr capsule Take 2 capsules (6 mg total) by mouth daily.  . carvedilol (COREG) 25 MG tablet Take 1 tablet (25 mg total) by mouth daily.  . diclofenac sodium (VOLTAREN) 1 % GEL APPLY 2 GRAMS TO AFFECTED AREA 4 TIMES A DAY  . ibuprofen (ADVIL,MOTRIN) 200 MG tablet Take 200 mg by mouth every 6 (six) hours as needed (pain).   Marland Kitchen. ketoconazole (NIZORAL) 2 % cream APPLY TO AFFECTED AREA EVERY DAY  . levothyroxine (SYNTHROID, LEVOTHROID) 100 MCG tablet Take 100 mcg by mouth daily before breakfast.   . levothyroxine (SYNTHROID, LEVOTHROID) 88 MCG tablet   . lisinopril (PRINIVIL,ZESTRIL) 40 MG tablet TAKE 1 TABLET BY MOUTH  DAILY  . loratadine (CLARITIN) 10 MG tablet Take 10  mg by mouth daily as needed for allergies.   . Multiple Vitamins-Minerals (MULTI FOR HER 50+) TABS Take 1 tablet by mouth daily.   Marland Kitchen. tiZANidine (ZANAFLEX) 2 MG tablet Take 0.5-1 tablets (1-2 mg total) by mouth every 6 (six) hours as needed for muscle spasms.  . traMADol (ULTRAM) 50 MG tablet Take 1 tablet (50 mg total) by mouth every 6 (six) hours as needed.   Current Facility-Administered Medications on File Prior to Visit  Medication  . methylPREDNISolone acetate (DEPO-MEDROL) injection 80 mg     Allergies:   Sulfamethoxazole-trimethoprim   Social History   Socioeconomic History  .  Marital status: Married    Spouse name: Not on file  . Number of children: Not on file  . Years of education: Not on file  . Highest education level: Not on file  Occupational History  . Not on file  Social Needs  . Financial resource strain: Not on file  . Food insecurity    Worry: Not on file    Inability: Not on file  . Transportation needs    Medical: Not on file    Non-medical: Not on file  Tobacco Use  . Smoking status: Never Smoker  . Smokeless tobacco: Never Used  Substance and Sexual Activity  . Alcohol use: No    Alcohol/week: 0.0 standard drinks  . Drug use: No  . Sexual activity: Not on file  Lifestyle  . Physical activity    Days per week: Not on file    Minutes per session: Not on file  . Stress: Not on file  Relationships  . Social Musicianconnections    Talks on phone: Not on file    Gets together: Not on file    Attends religious service: Not on file    Active member of club or organization: Not on file    Attends meetings of clubs or organizations: Not on file    Relationship status: Not on file  Other Topics Concern  . Not on file  Social History Narrative  . Not on file     Family History: The patient's family history includes Breast cancer in her mother; Heart disease in her sister; Lung disease in her brother; Memory loss in her father; Rheumatic fever in her sister. There is no history of Colon cancer.  ROS:   Please see the history of present illness.  Additional pertinent ROS: Constitutional: Negative for chills, fever, night sweats, unintentional weight loss. Positive for chronic fatigue. HENT: Negative for ear pain and hearing loss.   Eyes: Negative for loss of vision and eye pain.  Respiratory: Negative for cough, sputum, wheezing.   Cardiovascular: See HPI. Gastrointestinal: Negative for abdominal pain, melena, and hematochezia.  Genitourinary: Negative for dysuria and hematuria.  Musculoskeletal: Negative for falls and myalgias.  Skin:  Negative for itching and rash.  Neurological: Negative for focal weakness, focal sensory changes and loss of consciousness.  Endo/Heme/Allergies: Does not bruise/bleed easily.     EKGs/Labs/Other Studies Reviewed:    The following studies were reviewed today: Lexiscan 2018: no ischemia Holter 2018: no significant arrhythmias, brief 6 beat SVT and rare PACs. Echo 2018: LVH, EF 55-60%, grade 1 DD, mild AR  EKG:  EKG is personally reviewed.  The ekg ordered 03/09/18 demonstrates NSR, LVH  Recent Labs: 03/09/2018: BUN 20; Creatinine, Ser 1.22; Potassium 4.0; Sodium 142  Recent Lipid Panel No results found for: CHOL, TRIG, HDL, CHOLHDL, VLDL, LDLCALC, LDLDIRECT  Physical Exam:    VS:  BP (!) 142/92   Pulse 75   Ht 5\' 4"  (1.626 m)   Wt 160 lb (72.6 kg)   SpO2 99%   BMI 27.46 kg/m     Wt Readings from Last 3 Encounters:  08/26/18 160 lb (72.6 kg)  03/09/18 168 lb (76.2 kg)  01/21/18 167 lb (75.8 kg)     GEN: Well nourished, well developed in no acute distress HEENT: Normal, moist mucous membranes NECK: No JVD CARDIAC: regular rhythm, normal S1 and S2, no murmurs, rubs, gallops.  VASCULAR: Radial and DP pulses 2+ bilaterally. No carotid bruits RESPIRATORY:  Clear to auscultation without rales, wheezing or rhonchi  ABDOMEN: Soft, non-tender, non-distended MUSCULOSKELETAL:  Ambulates independently SKIN: Warm and dry, no edema NEUROLOGIC:  Alert and oriented x 3. No focal neuro deficits noted. PSYCHIATRIC:  Normal affect    ASSESSMENT:    1. Essential hypertension   2. Chronic fatigue   3. Medication management   4. Counseling on health promotion and disease prevention   5. Cardiac risk counseling    PLAN:   Hypertension: not at goal. Does not want anything that will increase urination (diuretic/MRA). On max ACEi and carvedilol. Discussed amlodipine at length, risk of side effects -will start amlodipine 2.5 mg daily  -aim for BP <130/80, watch for orthostasis -decreased  simvastatin (see below) given start of amlodipine  Chronic fatigue: has been chronic and progressive. No clear anginal symptoms. TSH nl recently, on levothyroxine -no evidence of clinical heart failure -no clear anginal symptoms -has had echo, monitor, and stress test in last two years (while having symptoms) that have not show cardiac etiology  Cardiac risk counseling and primary prevention recommendations: -discussed current guidelines re: statins for primary prevention over age 83 as well as recent evidence suggesting a slight increase in CV risk with cessation of statin. -discussed medication management of statin. Discussed staying on simvastatin (albeit at lower dose) to start amlodipine vs. Changing to another statin vs. Stopping statin entirely -after shared decision making, will decrease simvastatin to 20 mg with start of amlodipine -counseled on red flag warning signs, especially severe myalgia -last lipids 12/2017, LDL 97  Plan for follow up: 6 mos  Medication Adjustments/Labs and Tests Ordered: Current medicines are reviewed at length with the patient today.  Concerns regarding medicines are outlined above.  No orders of the defined types were placed in this encounter.  Meds ordered this encounter  Medications  . amLODipine (NORVASC) 2.5 MG tablet    Sig: Take 1 tablet (2.5 mg total) by mouth daily.    Dispense:  30 tablet    Refill:  11  . simvastatin (ZOCOR) 20 MG tablet    Sig: Take 1 tablet (20 mg total) by mouth daily.    Dispense:  30 tablet    Refill:  11    Patient Instructions  Medication Instructions:  Stop: Spironolactone 12.5 mg Decrease Simvastatin to 20 mg daily Start: Amlodipine 2.5 mg daily  If you need a refill on your cardiac medications before your next appointment, please call your pharmacy.   Lab work: None  Testing/Procedures: None  Follow-Up: At [facility], you and your health needs are our priority.  As part of our continuing  mission to provide you with exceptional heart care, we have created designated Provider Care Teams.  These Care Teams include your primary Cardiologist (physician) and Advanced Practice Providers (APPs -  Physician Assistants and Nurse Practitioners) who all work together to provide you with the care you  need, when you need it. You will need a follow up appointment in 6 months.  Please call our office 2 months in advance to schedule this appointment.  You may see Jodelle Red, MD or one of the following Advanced Practice Providers on your designated Care Team:   Theodore Demark, PA-C . Joni Reining, DNP, ANP   Amlodipine tablets What is this medicine? AMLODIPINE (am LOE di peen) is a calcium-channel blocker. It affects the amount of calcium found in your heart and muscle cells. This relaxes your blood vessels, which can reduce the amount of work the heart has to do. This medicine is used to lower high blood pressure. It is also used to prevent chest pain. This medicine may be used for other purposes; ask your health care provider or pharmacist if you have questions. COMMON BRAND NAME(S): Norvasc What should I tell my health care provider before I take this medicine? They need to know if you have any of these conditions:  heart disease  liver disease  an unusual or allergic reaction to amlodipine, other medicines, foods, dyes, or preservatives  pregnant or trying to get pregnant  breast-feeding How should I use this medicine? Take this medicine by mouth with a glass of water. Follow the directions on the prescription label. You can take it with or without food. If it upsets your stomach, take it with food. Take your medicine at regular intervals. Do not take it more often than directed. Do not stop taking except on your doctor's advice. Talk to your pediatrician regarding the use of this medicine in children. While this drug may be prescribed for children as young as 6 years for  selected conditions, precautions do apply. Patients over 63 years of age may have a stronger reaction and need a smaller dose. Overdosage: If you think you have taken too much of this medicine contact a poison control center or emergency room at once. NOTE: This medicine is only for you. Do not share this medicine with others. What if I miss a dose? If you miss a dose, take it as soon as you can. If it is almost time for your next dose, take only that dose. Do not take double or extra doses. What may interact with this medicine? Do not take this medicine with any of the following medications:  tranylcypromine This medicine may also interact with the following medications:  clarithromycin  cyclosporine  diltiazem  itraconazole  simvastatin  tacrolimus This list may not describe all possible interactions. Give your health care provider a list of all the medicines, herbs, non-prescription drugs, or dietary supplements you use. Also tell them if you smoke, drink alcohol, or use illegal drugs. Some items may interact with your medicine. What should I watch for while using this medicine? Visit your healthcare professional for regular checks on your progress. Check your blood pressure as directed. Ask your healthcare professional what your blood pressure should be and when you should contact him or her. Do not treat yourself for coughs, colds, or pain while you are using this medicine without asking your healthcare professional for advice. Some medicines may increase your blood pressure. You may get dizzy. Do not drive, use machinery, or do anything that needs mental alertness until you know how this medicine affects you. Do not stand or sit up quickly, especially if you are an older patient. This reduces the risk of dizzy or fainting spells. Avoid alcoholic drinks; they can make you dizzier. What side effects may  I notice from receiving this medicine? Side effects that you should report to  your doctor or health care professional as soon as possible:  allergic reactions like skin rash, itching or hives; swelling of the face, lips, or tongue  fast, irregular heartbeat  signs and symptoms of low blood pressure like dizziness; feeling faint or lightheaded, falls; unusually weak or tired  swelling of ankles, feet, hands Side effects that usually do not require medical attention (report these to your doctor or health care professional if they continue or are bothersome):  dry mouth  facial flushing  headache  stomach pain  tiredness This list may not describe all possible side effects. Call your doctor for medical advice about side effects. You may report side effects to FDA at 1-800-FDA-1088. Where should I keep my medicine? Keep out of the reach of children. Store at room temperature between 59 and 86 degrees F (15 and 30 degrees C). Throw away any unused medicine after the expiration date. NOTE: This sheet is a summary. It may not cover all possible information. If you have questions about this medicine, talk to your doctor, pharmacist, or health care provider.  2020 Elsevier/Gold Standard (2017-07-16 15:07:10)     Signed, Buford Dresser, MD PhD 08/26/2018 6:02 PM    [facility]

## 2018-10-31 ENCOUNTER — Other Ambulatory Visit: Payer: Self-pay | Admitting: Internal Medicine

## 2018-10-31 DIAGNOSIS — G4733 Obstructive sleep apnea (adult) (pediatric): Secondary | ICD-10-CM | POA: Insufficient documentation

## 2018-10-31 DIAGNOSIS — H918X2 Other specified hearing loss, left ear: Secondary | ICD-10-CM | POA: Insufficient documentation

## 2018-10-31 DIAGNOSIS — H903 Sensorineural hearing loss, bilateral: Secondary | ICD-10-CM | POA: Insufficient documentation

## 2018-10-31 DIAGNOSIS — IMO0001 Reserved for inherently not codable concepts without codable children: Secondary | ICD-10-CM | POA: Insufficient documentation

## 2018-10-31 NOTE — Telephone Encounter (Signed)
Refill Request.  

## 2018-11-03 NOTE — Telephone Encounter (Signed)
This is Dr. Judeth Cornfield pt now.

## 2018-11-09 ENCOUNTER — Encounter: Payer: Self-pay | Admitting: Podiatry

## 2018-11-09 ENCOUNTER — Ambulatory Visit: Payer: Medicare Other | Admitting: Podiatry

## 2018-11-09 ENCOUNTER — Other Ambulatory Visit: Payer: Self-pay

## 2018-11-09 DIAGNOSIS — I739 Peripheral vascular disease, unspecified: Secondary | ICD-10-CM

## 2018-11-09 DIAGNOSIS — B351 Tinea unguium: Secondary | ICD-10-CM | POA: Diagnosis not present

## 2018-11-09 DIAGNOSIS — M79675 Pain in left toe(s): Secondary | ICD-10-CM

## 2018-11-09 DIAGNOSIS — M79674 Pain in right toe(s): Secondary | ICD-10-CM

## 2018-11-13 NOTE — Progress Notes (Signed)
Subjective: Sharon Bailey is seen today for follow up painful, elongated, thickened toenails 1-5 b/l feet that she cannot cut. Pain interferes with daily activities. Aggravating factor includes wearing enclosed shoe gear and relieved with periodic debridement.  Current Outpatient Medications on File Prior to Visit  Medication Sig  . allopurinol (ZYLOPRIM) 300 MG tablet Take 300 mg by mouth daily.  Marland Kitchen amLODipine (NORVASC) 2.5 MG tablet Take 1 tablet (2.5 mg total) by mouth daily.  . budesonide (ENTOCORT EC) 3 MG 24 hr capsule Take 2 capsules (6 mg total) by mouth daily.  . carvedilol (COREG) 25 MG tablet Take 1 tablet (25 mg total) by mouth daily.  . diazepam (VALIUM) 5 MG tablet Take 5 mg by mouth 2 (two) times daily as needed.  . diclofenac sodium (VOLTAREN) 1 % GEL APPLY 2 GRAMS TO AFFECTED AREA 4 TIMES A DAY  . ibuprofen (ADVIL,MOTRIN) 200 MG tablet Take 200 mg by mouth every 6 (six) hours as needed (pain).   Marland Kitchen ketoconazole (NIZORAL) 2 % cream APPLY TO AFFECTED AREA EVERY DAY  . levothyroxine (SYNTHROID, LEVOTHROID) 100 MCG tablet Take 100 mcg by mouth daily before breakfast.   . levothyroxine (SYNTHROID, LEVOTHROID) 88 MCG tablet   . lisinopril (ZESTRIL) 40 MG tablet TAKE 1 TABLET BY MOUTH  DAILY  . loratadine (CLARITIN) 10 MG tablet Take 10 mg by mouth daily as needed for allergies.   . Multiple Vitamins-Minerals (MULTI FOR HER 50+) TABS Take 1 tablet by mouth daily.   . simvastatin (ZOCOR) 20 MG tablet Take 1 tablet (20 mg total) by mouth daily.  . simvastatin (ZOCOR) 40 MG tablet Take 40 mg by mouth at bedtime.  Marland Kitchen tiZANidine (ZANAFLEX) 2 MG tablet Take 0.5-1 tablets (1-2 mg total) by mouth every 6 (six) hours as needed for muscle spasms.  . traMADol (ULTRAM) 50 MG tablet Take 1 tablet (50 mg total) by mouth every 6 (six) hours as needed.   Current Facility-Administered Medications on File Prior to Visit  Medication  . methylPREDNISolone acetate (DEPO-MEDROL) injection 80 mg      Allergies  Allergen Reactions  . Sulfamethoxazole-Trimethoprim Diarrhea and Nausea Only     Objective:  Vascular Examination: Capillary refill time immediate x 10 digits.  Dorsalis pedis present b/l.  Posterior tibial pulses nonpalpable b/l.  Digital hair absent b/l.  Skin temperature gradient WNL b/l.   Dermatological Examination: Skin is thin, shiny and atrophic b/l.  Toenails 1-5 b/l discolored, thick, dystrophic with subungual debris and pain with palpation to nailbeds due to thickness of nails.  Musculoskeletal: Muscle strength 5/5 b/l to all LE muscle groups.  Hammertoes 2-4 b/l. Short 3rd digit left foot.  No pain, crepitus or joint limitation noted with ROM.   Neurological Examination: Protective sensation intact 5/5 with 10 gram monofilament bilaterally.  Assessment: Painful onychomycosis toenails 1-5 b/l  PaD  Plan: 1. Toenails 1-5 b/l were debrided in length and girth without iatrogenic bleeding. 2. Patient to continue soft, supportive shoe gear. 3. Patient to report any pedal injuries to medical professional immediately. 4. Follow up 3 months.  5. Patient/POA to call should there be a concern in the interim.

## 2019-02-09 ENCOUNTER — Telehealth: Payer: Self-pay | Admitting: Internal Medicine

## 2019-02-09 NOTE — Telephone Encounter (Signed)
Spoke with pt and let her know she is supposed to follow-up in 1 year. Pt scheduled to see Dr. Marina Goodell 02/20/19@3 :40pm. She is aware of appt.

## 2019-02-10 ENCOUNTER — Ambulatory Visit: Payer: Medicare PPO | Admitting: Podiatry

## 2019-02-10 ENCOUNTER — Other Ambulatory Visit: Payer: Self-pay

## 2019-02-10 ENCOUNTER — Encounter: Payer: Self-pay | Admitting: Podiatry

## 2019-02-10 DIAGNOSIS — M79674 Pain in right toe(s): Secondary | ICD-10-CM

## 2019-02-10 DIAGNOSIS — B351 Tinea unguium: Secondary | ICD-10-CM | POA: Diagnosis not present

## 2019-02-10 DIAGNOSIS — M79675 Pain in left toe(s): Secondary | ICD-10-CM | POA: Diagnosis not present

## 2019-02-10 NOTE — Patient Instructions (Signed)

## 2019-02-15 NOTE — Progress Notes (Signed)
Subjective: Sharon Bailey presents today for follow up of painful mycotic nails b/l that are difficult to trim. Pain interferes with ambulation. Aggravating factors include wearing enclosed shoe gear. Pain is relieved with periodic professional debridement.   Allergies  Allergen Reactions  . Sulfamethoxazole-Trimethoprim Diarrhea and Nausea Only     Objective: There were no vitals filed for this visit.  Vascular Examination:  Capillary refill time to digits immediate b/l, palpable DP pulses b/l, non-palpable PT pulses b/l, pedal hair absent b/l and skin temperature gradient within normal limits b/l  Dermatological Examination: Pedal skin is thin shiny, atrophic bilaterally, no open wounds bilaterally, no interdigital macerations bilaterally and toenails 1-5 b/l elongated, dystrophic, thickened, crumbly with subungual debris  Musculoskeletal: Normal muscle strength 5/5 to all lower extremity muscle groups bilaterally, no gross bony deformities bilaterally, no pain crepitus or joint limitation noted with ROM b/l and hammertoes noted to the  digits 2-4 b/l. Brachymetatarsal 3rd digit left foot.  Neurological: Protective sensation intact 5/5 intact bilaterally with 10g monofilament b/l and vibratory sensation intact b/l  Assessment: Pain due to onychomycosis of toenails of both feet  Plan: -Toenails 1-5 b/l were debrided in length and girth without iatrogenic bleeding. -Patient to continue soft, supportive shoe gear daily. -Patient to report any pedal injuries to medical professional immediately. -Patient/POA to call should there be question/concern in the interim.  Return in about 3 months (around 05/10/2019) for nail trim.

## 2019-02-20 ENCOUNTER — Encounter: Payer: Self-pay | Admitting: Internal Medicine

## 2019-02-20 ENCOUNTER — Ambulatory Visit: Payer: Medicare PPO | Admitting: Internal Medicine

## 2019-02-20 VITALS — BP 144/64 | HR 76 | Temp 98.1°F | Ht 64.0 in | Wt 156.5 lb

## 2019-02-20 DIAGNOSIS — K52839 Microscopic colitis, unspecified: Secondary | ICD-10-CM

## 2019-02-20 MED ORDER — BUDESONIDE 3 MG PO CPEP: 3.0000 mg | ORAL_CAPSULE | Freq: Every day | ORAL | 3 refills | Status: DC

## 2019-02-20 NOTE — Patient Instructions (Signed)
We have sent the following medications to your pharmacy for you to pick up at your convenience:  Budesonide  

## 2019-02-20 NOTE — Progress Notes (Signed)
HISTORY OF PRESENT ILLNESS:  Sharon Bailey is a 86 y.o. female with a history of biopsy-proven collagenous colitis who presents today for follow-up regarding the same.  Approximately 1 year ago.  Last colonoscopy December 2016.  She has been using budesonide on demand over the past year.  May go as long as 3 weeks without needing medication.  Typically will resume either 3 or 6 mg daily for 1 week when diarrhea returns.  She has concerns over insurance formulary preference and cost.  She brings a letter suggesting that she should be changed to mesalamine agents.  I advised her that this is not appropriate for this particular condition as it tends to be much less efficacious and certainly not first-line therapy.  She has had no interval problems.  No active GI complaints.  REVIEW OF SYSTEMS:  All non-GI ROS negative unless otherwise stated in the HPI except for back pain  Past Medical History:  Diagnosis Date  . Abnormal EKG   . Collagenous colitis   . Collagenous colitis   . Diverticulitis   . Diverticulosis   . Fluttering heart   . GERD (gastroesophageal reflux disease)   . Gout   . Gout attack 09/28/2012  . Hammer toe of left foot 09/28/2012  . Hyperlipidemia   . Hypertension   . Hypothyroidism   . Irregular heart beat   . Pain in joint, ankle and foot 09/28/2012  . Status post foot surgery 10/11/2012  . Thyroid disease     Past Surgical History:  Procedure Laterality Date  . ABDOMINAL HYSTERECTOMY    . ADENOIDECTOMY    . CHOLECYSTECTOMY    . Excision Ganglion Toe Left 09/29/2012   Lt #2 @ [facility]  . Hammer toe repair Left 09/29/2012   Lt #2 @ [facility]  . TONSILLECTOMY      Social History JOLENE GUYETT  reports that she has never smoked. She has never used smokeless tobacco. She reports that she does not drink alcohol or use drugs.  family history includes Breast cancer in her mother; Heart disease in her sister; Lung disease in her brother; Memory loss in her father; Rheumatic fever  in her sister.  Allergies  Allergen Reactions  . Sulfamethoxazole-Trimethoprim Diarrhea and Nausea Only       PHYSICAL EXAMINATION: Vital signs: BP (!) 144/64   Pulse 76   Temp 98.1 F (36.7 C)   Ht 5\' 4"  (1.626 m)   Wt 156 lb 8 oz (71 kg)   BMI 26.86 kg/m   Constitutional: generally well-appearing, no acute distress Psychiatric: alert and oriented x3, cooperative Eyes: extraocular movements intact, anicteric, conjunctiva pink Mouth: oral pharynx moist, no lesions Neck: supple no lymphadenopathy Cardiovascular: heart regular rate and rhythm, no murmur Lungs: clear to auscultation bilaterally Abdomen: soft, nontender, nondistended, no obvious ascites, no peritoneal signs, normal bowel sounds, no organomegaly Rectal: Omitted Extremities: no clubbing, cyanosis, or lower extremity edema bilaterally Skin: no lesions on visible extremities Neuro: No focal deficits.  Cranial nerves intact  ASSESSMENT:  1.  Biopsy-proven collagenous colitis.  Last colonoscopy December 2016.  Currently managing with on-demand budesonide   PLAN:  1.  Reviewed the pathophysiology of collagenous colitis 2.  Discussed treatment options for this condition 3.  Refill budesonide.  She will continue with on-demand use 4.  Routine follow-up 1 year.  Contact the office in the interim for any questions or problems A total of 30 min was spent preparing to see the patient, reviewing data, obtaining history,  performing comprehensive physical exam, counseling the patient regarding her collagenous colitis, ordering medication, and documenting clinical information in the health record

## 2019-02-22 ENCOUNTER — Telehealth: Payer: Self-pay

## 2019-02-22 MED ORDER — BUDESONIDE 3 MG PO CPEP: 3.0000 mg | ORAL_CAPSULE | Freq: Every day | ORAL | 3 refills | Status: DC

## 2019-02-22 NOTE — Telephone Encounter (Signed)
Prior authorization for Budesonide approved by [facility].  Sent rx to [facility] order pharmacy and called patient to let her know.

## 2019-03-02 ENCOUNTER — Telehealth (INDEPENDENT_AMBULATORY_CARE_PROVIDER_SITE_OTHER): Payer: Medicare PPO | Admitting: Cardiology

## 2019-03-02 ENCOUNTER — Encounter: Payer: Self-pay | Admitting: Cardiology

## 2019-03-02 VITALS — Ht 64.0 in | Wt 160.0 lb

## 2019-03-02 DIAGNOSIS — I10 Essential (primary) hypertension: Secondary | ICD-10-CM | POA: Diagnosis not present

## 2019-03-02 DIAGNOSIS — Z7189 Other specified counseling: Secondary | ICD-10-CM | POA: Diagnosis not present

## 2019-03-02 DIAGNOSIS — R5382 Chronic fatigue, unspecified: Secondary | ICD-10-CM

## 2019-03-02 NOTE — Progress Notes (Signed)
Virtual Visit via Telephone Note   This visit type was conducted due to national recommendations for restrictions regarding the COVID-19 Pandemic (e.g. social distancing) in an effort to limit this patient's exposure and mitigate transmission in our community.  Due to her co-morbid illnesses, this patient is at least at moderate risk for complications without adequate follow up.  This format is felt to be most appropriate for this patient at this time.  The patient did not have access to video technology/had technical difficulties with video requiring transitioning to audio format only (telephone).  All issues noted in this document were discussed and addressed.  No physical exam could be performed with this format.  Please refer to the patient's chart for her  consent to telehealth for [facility].   Date:  03/02/2019   ID:  Sharon Bailey, DOB July 01, 1930, MRN [id number]  Patient Location: Home Provider Location: Home  PCP:  Clovis Riley, Elbert Ewings.August Saucer, MD  Cardiologist:  Jodelle Red, MD  Electrophysiologist:  None   Evaluation Performed:  Follow-Up Visit  Chief Complaint:  Follow up  History of Present Illness:    Sharon Bailey is a 86 y.o. female with a hx of hypertension, hyperlipidemia, and gout.  The patient does not have symptoms concerning for COVID-19 infection (fever, chills, cough, or new shortness of breath).   Today: Home BP cuff broke, awaiting a new one. BP last week at her doctor's visit was 140/60. No syncope/side effects, tolerating medications well. Does have fatigue chronically with prolonged exertion, working on increasing walking. Staying safe from covid.  Denies chest pain, shortness of breath at rest or with normal exertion. No PND, orthopnea, LE edema or unexpected weight gain. No syncope or palpitations.   Past Medical History:  Diagnosis Date  . Abnormal EKG   . Collagenous colitis   . Collagenous colitis   . Diverticulitis   . Diverticulosis   .  Fluttering heart   . GERD (gastroesophageal reflux disease)   . Gout   . Gout attack 09/28/2012  . Hammer toe of left foot 09/28/2012  . Hyperlipidemia   . Hypertension   . Hypothyroidism   . Irregular heart beat   . Pain in joint, ankle and foot 09/28/2012  . Status post foot surgery 10/11/2012  . Thyroid disease    Past Surgical History:  Procedure Laterality Date  . ABDOMINAL HYSTERECTOMY    . ADENOIDECTOMY    . CHOLECYSTECTOMY    . Excision Ganglion Toe Left 09/29/2012   Lt #2 @ [facility]  . Hammer toe repair Left 09/29/2012   Lt #2 @ [facility]  . TONSILLECTOMY       Current Meds  Medication Sig  . allopurinol (ZYLOPRIM) 300 MG tablet Take 300 mg by mouth daily.  Marland Kitchen amLODipine (NORVASC) 2.5 MG tablet Take 1 tablet (2.5 mg total) by mouth daily.  . budesonide (ENTOCORT EC) 3 MG 24 hr capsule Take 1 capsule (3 mg total) by mouth daily.  . carvedilol (COREG) 25 MG tablet Take 1 tablet (25 mg total) by mouth daily.  Marland Kitchen ibuprofen (ADVIL,MOTRIN) 200 MG tablet Take 200 mg by mouth every 6 (six) hours as needed (pain).   Marland Kitchen levothyroxine (SYNTHROID, LEVOTHROID) 100 MCG tablet Take 100 mcg by mouth daily before breakfast.   . lisinopril (ZESTRIL) 40 MG tablet TAKE 1 TABLET BY MOUTH  DAILY  . loratadine (CLARITIN) 10 MG tablet Take 10 mg by mouth daily as needed for allergies.   . Multiple Vitamins-Minerals (MULTI FOR HER  50+) TABS Take 1 tablet by mouth daily.   . simvastatin (ZOCOR) 20 MG tablet Take 1 tablet (20 mg total) by mouth daily.  Marland Kitchen tiZANidine (ZANAFLEX) 2 MG tablet Take 0.5-1 tablets (1-2 mg total) by mouth every 6 (six) hours as needed for muscle spasms.  . traMADol (ULTRAM) 50 MG tablet Take 1 tablet (50 mg total) by mouth every 6 (six) hours as needed.   Current Facility-Administered Medications for the 03/02/19 encounter (Telemedicine) with Buford Dresser, MD  Medication  . methylPREDNISolone acetate (DEPO-MEDROL) injection 80 mg     Allergies:   Sulfamethoxazole-trimethoprim    Social History   Tobacco Use  . Smoking status: Never Smoker  . Smokeless tobacco: Never Used  Substance Use Topics  . Alcohol use: No    Alcohol/week: 0.0 standard drinks  . Drug use: No     Family Hx: The patient's family history includes Breast cancer in her mother; Heart disease in her sister; Lung disease in her brother; Memory loss in her father; Rheumatic fever in her sister. There is no history of Colon cancer.  ROS:   Please see the history of present illness.    All other systems reviewed and are negative.   Prior CV studies:   The following studies were reviewed today: No new since last visit  Labs/Other Tests and Data Reviewed:    EKG:  An ECG dated 03/09/18 was personally reviewed today and demonstrated:  NSR, IVCD in left bundle pattern  Recent Labs: 03/09/2018: BUN 20; Creatinine, Ser 1.22; Potassium 4.0; Sodium 142   Recent Lipid Panel No results found for: CHOL, TRIG, HDL, CHOLHDL, LDLCALC, LDLDIRECT  Wt Readings from Last 3 Encounters:  03/02/19 160 lb (72.6 kg)  02/20/19 156 lb 8 oz (71 kg)  08/26/18 160 lb (72.6 kg)     Objective:    Vital Signs:  Ht 5\' 4"  (1.626 m)   Wt 160 lb (72.6 kg)   BMI 27.46 kg/m    Speaking comfortably on the phone, no audible wheezing In no acute distress Alert and oriented Normal affect Normal speech  ASSESSMENT & PLAN:    Hypertension: at goal given her age/risk factors -continue amlodipine 2.5 mg daily  -continue carvedilol 25 mg BID -continue lisinopril 40 mg daily -has said she does not want diuretic/anything that makes her urinate more  Chronic fatigue: stable -working on gradually increasing walking -no clear anginal symptoms -has had echo, monitor, and stress test in last two years (while having symptoms) that have not show cardiac etiology  Cardiac risk counseling and primary prevention recommendations: -see prior discussion re: statins. Decreased simvastatin with start of amlodipine  -counseled on red flag warning signs, especially severe myalgia -last lipids 12/2017, LDL 97  Plan for follow up: 6 mos per patient preference  COVID-19 Education: The signs and symptoms of COVID-19 were discussed with the patient and how to seek care for testing (follow up with PCP or arrange E-visit).  The importance of social distancing was discussed today.  Time:   Today, I have spent 6 minutes with the patient with telehealth technology discussing the above problems.     Medication Adjustments/Labs and Tests Ordered: Current medicines are reviewed at length with the patient today.  Concerns regarding medicines are outlined above.   Tests Ordered: No orders of the defined types were placed in this encounter.   Medication Changes: No orders of the defined types were placed in this encounter.  Signed, Buford Dresser, MD  03/02/2019 9:47 AM  [facility]

## 2019-03-02 NOTE — Patient Instructions (Signed)

## 2019-03-17 ENCOUNTER — Encounter: Payer: Self-pay | Admitting: Family Medicine

## 2019-03-17 ENCOUNTER — Other Ambulatory Visit: Payer: Self-pay

## 2019-03-17 ENCOUNTER — Ambulatory Visit (INDEPENDENT_AMBULATORY_CARE_PROVIDER_SITE_OTHER): Payer: Medicare PPO | Admitting: Family Medicine

## 2019-03-17 DIAGNOSIS — M25511 Pain in right shoulder: Secondary | ICD-10-CM

## 2019-03-17 MED ORDER — TRAMADOL HCL 50 MG PO TABS: 50.0000 mg | ORAL_TABLET | Freq: Four times a day (QID) | ORAL | 0 refills | Status: DC | PRN

## 2019-03-17 NOTE — Progress Notes (Signed)
Office Visit Note   Patient: Sharon Bailey           Date of Birth: [birth year]           MRN: [id number] Visit Date: 03/17/2019 Requested by: Clovis Riley, L.August Saucer, MD [address] [place],  [place] [address] PCP: Clovis Riley, L.August Saucer, MD  Subjective: Chief Complaint  Patient presents with  . Right Shoulder - Pain    Posterior shoulder pain 3-4 weeks. Very achy. Decreased range of motion. Cannot sleep on right side.     HPI: She is here with recurrent right shoulder pain.  Last summer we injected her subacromial space with good results.  3 or 4 weeks ago it started hurting again with no injury.  Aching and painful with any movement.  Cannot sleep on her right side.  Pain is on the posterior aspect.              ROS:   All other systems were reviewed and are negative.  Objective: Vital Signs: There were no vitals taken for this visit.  Physical Exam:  General:  Alert and oriented, in no acute distress. Pulm:  Breathing unlabored. Psy:  Normal mood, congruent affect. Skin: No rash. Right shoulder: Full active range of motion, with lots of pain reaching overhead.  Tender trigger points in several areas on the posterior aspect of her scapular region.  Tender in the posterior subacromial space.  Rotator cuff strength is still 5/5 throughout, biceps, triceps, wrist and hand strength are normal.  Imaging: None today  Assessment & Plan: 1.  Recurrent right shoulder pain with impingement symptoms and myofascial pain -She would like another subacromial injection.  I also will inject the most symptomatic trigger point.  Tramadol as needed for pain.     Procedures: Right shoulder injection: After sterile prep with Betadine, injected 2 cc 1% lidocaine without epinephrine and 20 mg methylprednisolone into the symptomatic scapular area trigger point, and 3 cc 1% lidocaine with 20 mg methylprednisolone into the subacromial space.    PMFS History: Patient Active Problem List   Diagnosis Date Noted  . 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 Obstructive sleep apnea 10/31/2018  . Sensorineural hearing loss (SNHL) of both ears 10/31/2018  . Low back pain 09/21/2017  . Chronic fatigue 02/05/2017  . Atypical chest pain 02/05/2017  . Hyperlipidemia 02/05/2017  . Palpitations 04/18/2016  . Essential hypertension 04/18/2016  . Hypertensive heart disease without heart failure 04/18/2016  . Status post foot surgery 10/11/2012  . Gout attack 09/28/2012  . Hammer toe of left foot 09/28/2012  . Pain in joint, ankle and foot 09/28/2012   Past Medical History:  Diagnosis Date  . Abnormal EKG   . Collagenous colitis   . Collagenous colitis   . Diverticulitis   . Diverticulosis   . Fluttering heart   . GERD (gastroesophageal reflux disease)   . Gout   . Gout attack 09/28/2012  . Hammer toe of left foot 09/28/2012  . Hyperlipidemia   . Hypertension   . Hypothyroidism   . Irregular heart beat   . Pain in joint, ankle and foot 09/28/2012  . Status post foot surgery 10/11/2012  . Thyroid disease     Family History  Problem Relation Age of Onset  . Breast cancer Mother   . Memory loss Father   . Heart disease Sister   . Rheumatic fever Sister   . Lung disease Brother   . Colon cancer Neg  Hx     Past Surgical History:  Procedure Laterality Date  . ABDOMINAL HYSTERECTOMY    . ADENOIDECTOMY    . CHOLECYSTECTOMY    . Excision Ganglion Toe Left 09/29/2012   Lt #2 @ [facility]  . Hammer toe repair Left 09/29/2012   Lt #2 @ [facility]  . TONSILLECTOMY     Social History   Occupational History  . Not on file  Tobacco Use  . Smoking status: Never Smoker  . Smokeless tobacco: Never Used  Substance and Sexual Activity  . Alcohol use: No    Alcohol/week: 0.0 standard drinks  . Drug use: No  . Sexual activity: Not on file

## 2019-05-05 ENCOUNTER — Telehealth: Payer: Self-pay | Admitting: Physical Medicine and Rehabilitation

## 2019-05-05 NOTE — Telephone Encounter (Signed)
Patient left a message requesting an appointment. Left L5-S1 IL 07/19/2018. Ok to repeat if helped, same problem/side, and no new injury?

## 2019-05-05 NOTE — Telephone Encounter (Signed)
Left message #1

## 2019-05-05 NOTE — Telephone Encounter (Signed)
Ok

## 2019-05-09 NOTE — Telephone Encounter (Signed)
Patient requested an OV with possible repeat x-rays. Scheduled for OV on 5/18 at 0930.

## 2019-05-12 ENCOUNTER — Other Ambulatory Visit: Payer: Self-pay

## 2019-05-12 ENCOUNTER — Ambulatory Visit: Payer: Medicare PPO | Admitting: Podiatry

## 2019-05-12 ENCOUNTER — Encounter: Payer: Self-pay | Admitting: Podiatry

## 2019-05-12 DIAGNOSIS — M79674 Pain in right toe(s): Secondary | ICD-10-CM

## 2019-05-12 DIAGNOSIS — I739 Peripheral vascular disease, unspecified: Secondary | ICD-10-CM | POA: Diagnosis not present

## 2019-05-12 DIAGNOSIS — B351 Tinea unguium: Secondary | ICD-10-CM

## 2019-05-12 DIAGNOSIS — M79675 Pain in left toe(s): Secondary | ICD-10-CM

## 2019-05-12 NOTE — Patient Instructions (Signed)

## 2019-05-18 NOTE — Progress Notes (Signed)
Subjective: Sharon Bailey is a 86 y.o. female patient seen today painful mycotic nails b/l that are difficult to trim. Pain interferes with ambulation. Aggravating factors include wearing enclosed shoe gear. Pain is relieved with periodic professional debridement.  She also has h/o gout.   She voices no new pedal problems on today's visit.  Patient Active Problem List   Diagnosis Date Noted  . 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 Obstructive sleep apnea 10/31/2018  . Sensorineural hearing loss (SNHL) of both ears 10/31/2018  . Low back pain 09/21/2017  . Chronic fatigue 02/05/2017  . Atypical chest pain 02/05/2017  . Hyperlipidemia 02/05/2017  . Palpitations 04/18/2016  . Essential hypertension 04/18/2016  . Hypertensive heart disease without heart failure 04/18/2016  . Status post foot surgery 10/11/2012  . Gout attack 09/28/2012  . Hammer toe of left foot 09/28/2012  . Pain in joint, ankle and foot 09/28/2012    Current Outpatient Medications on File Prior to Visit  Medication Sig Dispense Refill  . allopurinol (ZYLOPRIM) 300 MG tablet Take 300 mg by mouth daily.    Marland Kitchen amLODipine (NORVASC) 2.5 MG tablet Take 1 tablet (2.5 mg total) by mouth daily. 30 tablet 11  . budesonide (ENTOCORT EC) 3 MG 24 hr capsule Take 1 capsule (3 mg total) by mouth daily. 270 capsule 3  . carvedilol (COREG) 25 MG tablet Take 1 tablet (25 mg total) by mouth daily. 60 tablet 6  . ibuprofen (ADVIL,MOTRIN) 200 MG tablet Take 200 mg by mouth every 6 (six) hours as needed (pain).     Marland Kitchen levothyroxine (SYNTHROID, LEVOTHROID) 100 MCG tablet Take 100 mcg by mouth daily before breakfast.     . lisinopril (ZESTRIL) 40 MG tablet TAKE 1 TABLET BY MOUTH  DAILY 90 tablet 1  . loratadine (CLARITIN) 10 MG tablet Take 10 mg by mouth daily as needed for allergies.     . Multiple Vitamins-Minerals (MULTI FOR HER 50+) TABS Take 1 tablet by mouth daily.     . simvastatin (ZOCOR) 20 MG tablet Take 1 tablet (20  mg total) by mouth daily. 30 tablet 11  . tiZANidine (ZANAFLEX) 2 MG tablet Take 0.5-1 tablets (1-2 mg total) by mouth every 6 (six) hours as needed for muscle spasms. 60 tablet 1  . traMADol (ULTRAM) 50 MG tablet Take 1 tablet (50 mg total) by mouth every 6 (six) hours as needed. 30 tablet 0  . traMADol (ULTRAM) 50 MG tablet Take 1 tablet (50 mg total) by mouth every 6 (six) hours as needed. 30 tablet 0   Current Facility-Administered Medications on File Prior to Visit  Medication Dose Route Frequency Provider Last Rate Last Admin  . methylPREDNISolone acetate (DEPO-MEDROL) injection 80 mg  80 mg Other Once Tyrell Antonio, MD        Allergies  Allergen Reactions  . Sulfamethoxazole-Trimethoprim Diarrhea and Nausea Only    Objective: Physical Exam  General: Sharon Bailey is a pleasant 86 y.o. y.o. Caucasian female, in NAD. AAO x 3.   Vascular:  Neurovascular status unchanged b/l. Capillary refill time to digits immediate b/l. Palpable DP pulses b/l. Nonpalpable PT pulses b/l. Pedal hair absent b/l Skin temperature gradient within normal limits b/l.  Dermatological:  Pedal skin is thin shiny, atrophic bilaterally. No open wounds bilaterally. Toenails 1-5 b/l elongated, dystrophic, thickened, crumbly with subungual debris and tenderness to dorsal palpation.  Musculoskeletal:  Normal muscle strength 5/5 to all lower extremity muscle groups bilaterally. No pain crepitus or  joint limitation noted with ROM b/l. Hammertoes noted to the L 2nd toe, L 3rd toe, L 4th toe, R 2nd toe, R 3rd toe and R 4th toe. Brachymetatarsia left 3rd digit.  Neurological:  Protective sensation intact 5/5 intact bilaterally with 10g monofilament b/l. Proprioception intact bilaterally.  Assessment and Plan:  1. Pain due to onychomycosis of toenails of both feet   2. PAD (peripheral artery disease) ([place])    -Examined patient. -No new findings. No new orders. -Toenails 1-5 b/l were debrided in length and  girth with sterile nail nippers and dremel without iatrogenic bleeding.  -Patient to continue soft, supportive shoe gear daily. -Patient to report any pedal injuries to medical professional immediately. -Patient/POA to call should there be question/concern in the interim.  Return in about 3 months (around 08/12/2019).  Marzetta Board, DPM

## 2019-05-23 ENCOUNTER — Ambulatory Visit: Payer: Medicare PPO | Admitting: Physical Medicine and Rehabilitation

## 2019-05-23 ENCOUNTER — Encounter: Payer: Self-pay | Admitting: Physical Medicine and Rehabilitation

## 2019-05-23 ENCOUNTER — Other Ambulatory Visit: Payer: Self-pay

## 2019-05-23 VITALS — BP 186/90 | HR 107 | Ht 64.0 in | Wt 162.0 lb

## 2019-05-23 DIAGNOSIS — M47816 Spondylosis without myelopathy or radiculopathy, lumbar region: Secondary | ICD-10-CM | POA: Diagnosis not present

## 2019-05-23 DIAGNOSIS — M5416 Radiculopathy, lumbar region: Secondary | ICD-10-CM | POA: Diagnosis not present

## 2019-05-23 DIAGNOSIS — G8929 Other chronic pain: Secondary | ICD-10-CM

## 2019-05-23 DIAGNOSIS — M5442 Lumbago with sciatica, left side: Secondary | ICD-10-CM

## 2019-05-23 MED ORDER — TRAMADOL HCL 50 MG PO TABS: 50.0000 mg | ORAL_TABLET | Freq: Two times a day (BID) | ORAL | 0 refills | Status: DC | PRN
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
## 2019-05-31 ENCOUNTER — Encounter: Payer: Self-pay | Admitting: Physical Medicine and Rehabilitation

## 2019-06-11 ENCOUNTER — Encounter (HOSPITAL_COMMUNITY): Payer: Self-pay | Admitting: *Deleted

## 2019-06-11 ENCOUNTER — Emergency Department (HOSPITAL_COMMUNITY): Payer: Medicare PPO

## 2019-06-11 ENCOUNTER — Inpatient Hospital Stay (HOSPITAL_COMMUNITY)
Admission: EM | Admit: 2019-06-11 | Discharge: 2019-06-13 | DRG: 304 | Disposition: A | Discharge status: Home / Self Care | Payer: Medicare PPO | Attending: Internal Medicine | Admitting: Internal Medicine

## 2019-06-11 DIAGNOSIS — Z6827 Body mass index (BMI) 27.0-27.9, adult: Secondary | ICD-10-CM

## 2019-06-11 DIAGNOSIS — E663 Overweight: Secondary | ICD-10-CM | POA: Diagnosis present

## 2019-06-11 DIAGNOSIS — M109 Gout, unspecified: Secondary | ICD-10-CM | POA: Diagnosis present

## 2019-06-11 DIAGNOSIS — I10 Essential (primary) hypertension: Secondary | ICD-10-CM | POA: Diagnosis present

## 2019-06-11 DIAGNOSIS — E872 Acidosis, unspecified: Secondary | ICD-10-CM | POA: Diagnosis present

## 2019-06-11 DIAGNOSIS — I361 Nonrheumatic tricuspid (valve) insufficiency: Secondary | ICD-10-CM | POA: Diagnosis not present

## 2019-06-11 DIAGNOSIS — Z66 Do not resuscitate: Secondary | ICD-10-CM | POA: Diagnosis present

## 2019-06-11 DIAGNOSIS — R112 Nausea with vomiting, unspecified: Secondary | ICD-10-CM | POA: Diagnosis not present

## 2019-06-11 DIAGNOSIS — N1832 Chronic kidney disease, stage 3b: Secondary | ICD-10-CM | POA: Diagnosis present

## 2019-06-11 DIAGNOSIS — E785 Hyperlipidemia, unspecified: Secondary | ICD-10-CM | POA: Diagnosis present

## 2019-06-11 DIAGNOSIS — I639 Cerebral infarction, unspecified: Secondary | ICD-10-CM

## 2019-06-11 DIAGNOSIS — I129 Hypertensive chronic kidney disease with stage 1 through stage 4 chronic kidney disease, or unspecified chronic kidney disease: Secondary | ICD-10-CM | POA: Diagnosis present

## 2019-06-11 DIAGNOSIS — Z20822 Contact with and (suspected) exposure to covid-19: Secondary | ICD-10-CM | POA: Diagnosis present

## 2019-06-11 DIAGNOSIS — Z90710 Acquired absence of both cervix and uterus: Secondary | ICD-10-CM

## 2019-06-11 DIAGNOSIS — I161 Hypertensive emergency: Principal | ICD-10-CM | POA: Diagnosis present

## 2019-06-11 DIAGNOSIS — E039 Hypothyroidism, unspecified: Secondary | ICD-10-CM | POA: Diagnosis present

## 2019-06-11 DIAGNOSIS — K219 Gastro-esophageal reflux disease without esophagitis: Secondary | ICD-10-CM | POA: Diagnosis present

## 2019-06-11 DIAGNOSIS — E860 Dehydration: Secondary | ICD-10-CM | POA: Diagnosis present

## 2019-06-11 DIAGNOSIS — Z79890 Hormone replacement therapy (postmenopausal): Secondary | ICD-10-CM

## 2019-06-11 DIAGNOSIS — N179 Acute kidney failure, unspecified: Secondary | ICD-10-CM | POA: Diagnosis present

## 2019-06-11 DIAGNOSIS — R29700 NIHSS score 0: Secondary | ICD-10-CM | POA: Diagnosis present

## 2019-06-11 DIAGNOSIS — Z79899 Other long term (current) drug therapy: Secondary | ICD-10-CM | POA: Diagnosis not present

## 2019-06-11 DIAGNOSIS — I6381 Other cerebral infarction due to occlusion or stenosis of small artery: Secondary | ICD-10-CM | POA: Diagnosis present

## 2019-06-11 DIAGNOSIS — H903 Sensorineural hearing loss, bilateral: Secondary | ICD-10-CM | POA: Diagnosis present

## 2019-06-11 DIAGNOSIS — R42 Dizziness and giddiness: Secondary | ICD-10-CM

## 2019-06-11 DIAGNOSIS — I350 Nonrheumatic aortic (valve) stenosis: Secondary | ICD-10-CM | POA: Diagnosis not present

## 2019-06-11 HISTORY — DX: Acute kidney failure, unspecified: N17.9

## 2019-06-11 HISTORY — DX: Cerebral infarction, unspecified: I63.9

## 2019-06-11 HISTORY — DX: Nausea with vomiting, unspecified: R11.2

## 2019-06-11 HISTORY — DX: Hypertensive emergency: I16.1

## 2019-06-11 LAB — CBC
HCT: 38.6 % (ref 36.0–46.0)
Hemoglobin: 11.8 g/dL — ABNORMAL LOW (ref 12.0–15.0)
MCH: 26.8 pg (ref 26.0–34.0)
MCHC: 30.6 g/dL (ref 30.0–36.0)
MCV: 87.5 fL (ref 80.0–100.0)
Platelets: 237 10*3/uL (ref 150–400)
RBC: 4.41 MIL/uL (ref 3.87–5.11)
RDW: 17 % — ABNORMAL HIGH (ref 11.5–15.5)
WBC: 12 10*3/uL — ABNORMAL HIGH (ref 4.0–10.5)
nRBC: 0 % (ref 0.0–0.2)

## 2019-06-11 LAB — BASIC METABOLIC PANEL
Anion gap: 15 (ref 5–15)
BUN: 25 mg/dL — ABNORMAL HIGH (ref 8–23)
CO2: 19 mmol/L — ABNORMAL LOW (ref 22–32)
Calcium: 9.4 mg/dL (ref 8.9–10.3)
Chloride: 110 mmol/L (ref 98–111)
Creatinine, Ser: 1.52 mg/dL — ABNORMAL HIGH (ref 0.44–1.00)
GFR calc Af Amer: 36 mL/min — ABNORMAL LOW (ref >60)
GFR calc non Af Amer: 31 mL/min — ABNORMAL LOW (ref >60)
Glucose, Bld: 120 mg/dL — ABNORMAL HIGH (ref 70–99)
Potassium: 3.8 mmol/L (ref 3.5–5.1)
Sodium: 144 mmol/L (ref 135–145)

## 2019-06-11 LAB — HEPATIC FUNCTION PANEL
ALT: 15 U/L (ref 0–44)
AST: 18 U/L (ref 15–41)
Albumin: 3.1 g/dL — ABNORMAL LOW (ref 3.5–5.0)
Alkaline Phosphatase: 65 U/L (ref 38–126)
Bilirubin, Direct: 0.2 mg/dL (ref 0.0–0.2)
Indirect Bilirubin: 0.7 mg/dL (ref 0.3–0.9)
Total Bilirubin: 0.9 mg/dL (ref 0.3–1.2)
Total Protein: 5.9 g/dL — ABNORMAL LOW (ref 6.5–8.1)

## 2019-06-11 LAB — TROPONIN I (HIGH SENSITIVITY)
Troponin I (High Sensitivity): 8 ng/L (ref <18)
Troponin I (High Sensitivity): 17 ng/L (ref <18)

## 2019-06-11 LAB — SARS CORONAVIRUS 2 BY RT PCR (HOSPITAL ORDER, PERFORMED IN ~~LOC~~ HOSPITAL LAB): SARS Coronavirus 2: NEGATIVE

## 2019-06-11 LAB — LIPASE, BLOOD: Lipase: 32 U/L (ref 11–51)

## 2019-06-11 MED ORDER — LEVOTHYROXINE SODIUM 88 MCG PO TABS
88.0000 ug | ORAL_TABLET | Freq: Every day | ORAL | Status: DC
  Administered 2019-06-12 – 2019-06-13 (×2): 88 ug via ORAL
  Filled 2019-06-11 (×3): qty 1

## 2019-06-11 MED ORDER — SIMVASTATIN 20 MG PO TABS
40.0000 mg | ORAL_TABLET | Freq: Every day | ORAL | Status: DC
  Administered 2019-06-12: 40 mg via ORAL
  Filled 2019-06-11: qty 2

## 2019-06-11 MED ORDER — LABETALOL HCL 5 MG/ML IV SOLN: 10.0000 mg | INTRAVENOUS | Status: DC | PRN

## 2019-06-11 MED ORDER — LORAZEPAM 2 MG/ML IJ SOLN
1.0000 mg | Freq: Once | INTRAMUSCULAR | Status: AC
  Administered 2019-06-11: 1 mg via INTRAVENOUS
  Filled 2019-06-11: qty 1

## 2019-06-11 MED ORDER — STROKE: EARLY STAGES OF RECOVERY BOOK
Freq: Once | Status: DC
  Filled 2019-06-11: qty 1

## 2019-06-11 MED ORDER — BUDESONIDE 3 MG PO CPEP
3.0000 mg | ORAL_CAPSULE | Freq: Every day | ORAL | Status: DC
  Administered 2019-06-12 – 2019-06-13 (×2): 3 mg via ORAL
  Filled 2019-06-11 (×2): qty 1

## 2019-06-11 MED ORDER — LABETALOL HCL 5 MG/ML IV SOLN
5.0000 mg | INTRAVENOUS | Status: DC | PRN
  Administered 2019-06-12 (×2): 5 mg via INTRAVENOUS
  Filled 2019-06-11 (×2): qty 4

## 2019-06-11 MED ORDER — ONDANSETRON HCL 4 MG/2ML IJ SOLN: 4.0000 mg | Freq: Four times a day (QID) | INTRAMUSCULAR | Status: DC | PRN

## 2019-06-11 MED ORDER — ACETAMINOPHEN 650 MG RE SUPP: 650.0000 mg | RECTAL | Status: DC | PRN

## 2019-06-11 MED ORDER — SODIUM CHLORIDE 0.9% FLUSH: 3.0000 mL | Freq: Once | INTRAVENOUS | Status: DC

## 2019-06-11 MED ORDER — LACTATED RINGERS IV SOLN: INTRAVENOUS | Status: DC

## 2019-06-11 MED ORDER — ENOXAPARIN SODIUM 30 MG/0.3ML ~~LOC~~ SOLN
30.0000 mg | SUBCUTANEOUS | Status: DC
  Administered 2019-06-11: 30 mg via SUBCUTANEOUS
  Filled 2019-06-11: qty 0.3

## 2019-06-11 MED ORDER — ASPIRIN 300 MG RE SUPP: 300.0000 mg | Freq: Every day | RECTAL | Status: DC

## 2019-06-11 MED ORDER — SODIUM CHLORIDE 0.9 % IV SOLN: INTRAVENOUS | Status: DC

## 2019-06-11 MED ORDER — ACETAMINOPHEN 160 MG/5ML PO SOLN: 650.0000 mg | ORAL | Status: DC | PRN

## 2019-06-11 MED ORDER — ASPIRIN 325 MG PO TABS
325.0000 mg | ORAL_TABLET | Freq: Every day | ORAL | Status: DC
  Administered 2019-06-11 – 2019-06-12 (×2): 325 mg via ORAL
  Filled 2019-06-11 (×2): qty 1

## 2019-06-11 MED ORDER — MECLIZINE HCL 25 MG PO TABS
25.0000 mg | ORAL_TABLET | Freq: Once | ORAL | Status: AC
  Administered 2019-06-11: 25 mg via ORAL
  Filled 2019-06-11: qty 1

## 2019-06-11 MED ORDER — ALLOPURINOL 300 MG PO TABS
300.0000 mg | ORAL_TABLET | Freq: Every day | ORAL | Status: DC
  Administered 2019-06-12 – 2019-06-13 (×2): 300 mg via ORAL
  Filled 2019-06-11 (×3): qty 1

## 2019-06-11 MED ORDER — ACETAMINOPHEN 325 MG PO TABS: 650.0000 mg | ORAL_TABLET | ORAL | Status: DC | PRN

## 2019-06-11 MED ORDER — SODIUM CHLORIDE 0.9 % IV BOLUS
500.0000 mL | Freq: Once | INTRAVENOUS | Status: AC
  Administered 2019-06-11: 500 mL via INTRAVENOUS

## 2019-06-11 NOTE — H&P (Addendum)
History and Physical    Sharon Bailey QAS:[id number] DOB: Apr 09, 1930 DOA: 06/11/2019  PCP: Alroy Dust, L.Marlou Sa, MD Patient coming from: Home  Chief Complaint: Lightheadedness  HPI: Sharon Bailey is a 86 y.o. female with medical history significant of bilateral sensorineural hearing loss, hypertension, hyperlipidemia, hypothyroidism, gout, GERD presenting with complaints of lightheadedness, nausea, and vomiting.  Patient states she was feeling well during the day then all of a sudden during the afternoon started feeling lightheaded and was staggering when she tried to walk to the bathroom.  Soon after she felt nauseous and started vomiting.  She had another episode of vomiting at home later.  She did not have any focal weakness or numbness.  Per daughter at bedside she did not have any slurring of speech or drooping of her face.  Patient denies prior history of stroke.  Denies any recent illness, fevers, chills, cough, shortness of breath, chest pain, abdominal pain, diarrhea, or dysuria.  ED Course: Blood pressure elevated on arrival with systolic in the 798X.  Slightly tachycardic and tachypneic on arrival.  Afebrile and not hypoxic.  WBC count mildly elevated at 12, no significant change compared to prior labs.  Hemoglobin 11.8, stable compared to prior labs.  Sodium level normal.  Bicarb 19, anion gap 15.  Blood glucose 120.  BUN 25, creatinine 1.5.  Baseline creatinine 1.2.  High-sensitivity troponin negative.  EKG without acute ischemic changes.  Chest x-ray without an acute process which would explain the patient's current symptoms.  Head CT negative for acute intracranial abnormality.  Brain MRI showing a punctate acute cortical infarct within the posterior right frontal lobe.  Patient received Ativan, meclizine, and a 500 cc normal saline bolus.  Dr. Rory Percy from neurology has been consulted.  Review of Systems:  All systems reviewed and apart from history of presenting illness, are  negative.  Past Medical History:  Diagnosis Date  . Abnormal EKG   . Collagenous colitis   . Collagenous colitis   . Diverticulitis   . Diverticulosis   . Fluttering heart   . GERD (gastroesophageal reflux disease)   . Gout   . Gout attack 09/28/2012  . Hammer toe of left foot 09/28/2012  . Hyperlipidemia   . Hypertension   . Hypothyroidism   . Irregular heart beat   . Pain in joint, ankle and foot 09/28/2012  . Status post foot surgery 10/11/2012  . Thyroid disease     Past Surgical History:  Procedure Laterality Date  . ABDOMINAL HYSTERECTOMY    . ADENOIDECTOMY    . CHOLECYSTECTOMY    . Excision Ganglion Toe Left 09/29/2012   Lt #2 @ [place]  . Hammer toe repair Left 09/29/2012   Lt #2 @ [place]  . TONSILLECTOMY       reports that she has never smoked. She has never used smokeless tobacco. She reports that she does not drink alcohol or use drugs.  Allergies  Allergen Reactions  . Sulfamethoxazole-Trimethoprim Diarrhea and Nausea Only    Family History  Problem Relation Age of Onset  . Breast cancer Mother   . Memory loss Father   . Heart disease Sister   . Rheumatic fever Sister   . Lung disease Brother   . Colon cancer Neg Hx     Prior to Admission medications   Medication Sig Start Date End Date Taking? Authorizing Provider  allopurinol (ZYLOPRIM) 300 MG tablet Take 300 mg by mouth daily.    [provider]  amLODipine (NORVASC) 2.5  MG tablet Take 1 tablet (2.5 mg total) by mouth daily. 08/26/18 08/21/19  Jodelle Red, MD  budesonide (ENTOCORT EC) 3 MG 24 hr capsule Take 1 capsule (3 mg total) by mouth daily. 02/22/19   Hilarie Fredrickson, MD  carvedilol (COREG) 25 MG tablet Take 1 tablet (25 mg total) by mouth daily. 03/09/18   Georgie Chard D, NP  ibuprofen (ADVIL,MOTRIN) 200 MG tablet Take 200 mg by mouth every 6 (six) hours as needed (pain).     [provider]  levothyroxine (SYNTHROID, LEVOTHROID) 100 MCG tablet Take 100 mcg by mouth daily  before breakfast.  08/29/12   [provider]  lisinopril (ZESTRIL) 40 MG tablet TAKE 1 TABLET BY MOUTH  DAILY 11/04/18   Jodelle Red, MD  loratadine (CLARITIN) 10 MG tablet Take 10 mg by mouth daily as needed for allergies.     [provider]  Multiple Vitamins-Minerals (MULTI FOR HER 50+) TABS Take 1 tablet by mouth daily.     [provider]  simvastatin (ZOCOR) 20 MG tablet Take 1 tablet (20 mg total) by mouth daily. 08/26/18   Jodelle Red, MD  traMADol (ULTRAM) 50 MG tablet Take 1 tablet (50 mg total) by mouth every 12 (twelve) hours as needed. 05/23/19   Tyrell Antonio, MD    Physical Exam: Vitals:   06/11/19 1915 06/11/19 2030 06/11/19 2045 06/11/19 2100  BP:    (!) 146/89  Pulse: (!) 105 (!) 107 94 99  Resp: 18 20 18 17   Temp:      SpO2: 99% 98% 97% 99%  Weight:      Height:        Physical Exam  Constitutional: She is oriented to person, place, and time. She appears well-developed and well-nourished. No distress.  HENT:  Head: Normocephalic.  Eyes: EOM are normal. Right eye exhibits no discharge. Left eye exhibits no discharge.  Cardiovascular: Normal rate, regular rhythm and intact distal pulses.  Murmur heard. Pulmonary/Chest: Effort normal and breath sounds normal. No respiratory distress. She has no wheezes. She has no rales.  Abdominal: Soft. Bowel sounds are normal. She exhibits no distension. There is no abdominal tenderness. There is no guarding.  Musculoskeletal:        General: No edema.     Cervical back: Neck supple.  Neurological: She is alert and oriented to person, place, and time. No cranial nerve deficit.  Speech fluent, tongue midline, no facial droop Strength 5 out of 5 in bilateral upper and lower extremities. Sensation to light touch intact throughout.  Skin: Skin is warm and dry. She is not diaphoretic.    Labs on Admission: I have personally reviewed following labs and imaging  studies  CBC: Recent Labs  Lab 06/11/19 1617  WBC 12.0*  HGB 11.8*  HCT 38.6  MCV 87.5  PLT 237   Basic Metabolic Panel: Recent Labs  Lab 06/11/19 1617  NA 144  K 3.8  CL 110  CO2 19*  GLUCOSE 120*  BUN 25*  CREATININE 1.52*  CALCIUM 9.4   GFR: Estimated Creatinine Clearance: 26.1 mL/min (A) (by C-G formula based on SCr of 1.52 mg/dL (H)). Liver Function Tests: No results for input(s): AST, ALT, ALKPHOS, BILITOT, PROT, ALBUMIN in the last 168 hours. No results for input(s): LIPASE, AMYLASE in the last 168 hours. No results for input(s): AMMONIA in the last 168 hours. Coagulation Profile: No results for input(s): INR, PROTIME in the last 168 hours. Cardiac Enzymes: No results for input(s): CKTOTAL,  CKMB, CKMBINDEX, TROPONINI in the last 168 hours. BNP (last 3 results) No results for input(s): PROBNP in the last 8760 hours. HbA1C: No results for input(s): HGBA1C in the last 72 hours. CBG: No results for input(s): GLUCAP in the last 168 hours. Lipid Profile: No results for input(s): CHOL, HDL, LDLCALC, TRIG, CHOLHDL, LDLDIRECT in the last 72 hours. Thyroid Function Tests: No results for input(s): TSH, T4TOTAL, FREET4, T3FREE, THYROIDAB in the last 72 hours. Anemia Panel: No results for input(s): VITAMINB12, FOLATE, FERRITIN, TIBC, IRON, RETICCTPCT in the last 72 hours. Urine analysis:    Component Value Date/Time   COLORURINE YELLOW 10/07/2014 1830   APPEARANCEUR CLOUDY (A) 10/07/2014 1830   LABSPEC 1.004 (L) 10/07/2014 1830   PHURINE 5.5 10/07/2014 1830   GLUCOSEU NEGATIVE 10/07/2014 1830   HGBUR NEGATIVE 10/07/2014 1830   BILIRUBINUR NEGATIVE 10/07/2014 1830   KETONESUR NEGATIVE 10/07/2014 1830   PROTEINUR NEGATIVE 10/07/2014 1830   UROBILINOGEN 0.2 10/07/2014 1830   NITRITE NEGATIVE 10/07/2014 1830   LEUKOCYTESUR LARGE (A) 10/07/2014 1830    Radiological Exams on Admission: DG Chest 2 View  Result Date: 06/11/2019 CLINICAL DATA:  Dizziness. Feeling  funny in her head 1 hour. Nausea, vomiting, dizziness. EXAM: CHEST - 2 VIEW COMPARISON:  06/27/2010 FINDINGS: Heart size is normal. There is mild prominence of interstitial markings. There are no focal consolidations or pleural effusions. No edema. IMPRESSION: Mild prominence of interstitial markings.  No acute consolidation. Electronically Signed   By: Norva Pavlov M.D.   On: 06/11/2019 16:50   CT Head Wo Contrast  Result Date: 06/11/2019 CLINICAL DATA:  Headache, dizziness, nausea, vomiting EXAM: CT HEAD WITHOUT CONTRAST TECHNIQUE: Contiguous axial images were obtained from the base of the skull through the vertex without intravenous contrast. COMPARISON:  None. FINDINGS: Brain: No evidence of acute infarction, hemorrhage, hydrocephalus, extra-axial collection or mass lesion/mass effect. Periventricular white matter hypodensity. Vascular: No hyperdense vessel or unexpected calcification. Skull: Normal. Negative for fracture or focal lesion. Sinuses/Orbits: No acute finding. Other: None. IMPRESSION: No acute intracranial pathology.  Small-vessel white matter disease. Electronically Signed   By: Lauralyn Primes M.D.   On: 06/11/2019 17:07   MR BRAIN WO CONTRAST  Result Date: 06/11/2019 CLINICAL DATA:  Ataxia, stroke suspected. Additional history provided by scanning technologist: Patient felt "woozy" in her head for 1 hour with nausea and vomiting EXAM: MRI HEAD WITHOUT CONTRAST TECHNIQUE: Multiplanar, multiecho pulse sequences of the brain and surrounding structures were obtained without intravenous contrast. COMPARISON:  Noncontrast head CT performed earlier the same day 06/11/2019 FINDINGS: Brain: There is a punctate acute cortical infarct within the posterior right frontal lobe (series 5, image 92) (series 7, image 53). No acute infarct is identified elsewhere within the brain. Mild to moderate patchy T2/FLAIR hyperintensity within the cerebral white matter is nonspecific, but consistent with chronic  small vessel ischemic disease. Chronic right cerebellar lacunar infarct Mild-to-moderate generalized parenchymal atrophy. No evidence of intracranial mass. No chronic intracranial blood products. No extra-axial fluid collection. No midline shift. Vascular: Expected proximal arterial flow voids. Skull and upper cervical spine: No focal marrow lesion. Sinuses/Orbits: Visualized orbits show no acute finding. Minimal ethmoid sinus mucosal thickening. Trace bilateral mastoid effusions. IMPRESSION: Punctate acute cortical infarct within the posterior right frontal lobe. Mild-to-moderate generalized parenchymal atrophy and chronic small vessel ischemic disease. Chronic right cerebellar lacunar infarct. Minimal ethmoid sinus mucosal thickening. Trace bilateral mastoid effusions. Electronically Signed   By: Jackey Loge DO   On: 06/11/2019 20:08    EKG:  Independently reviewed.  Sinus tachycardia, no acute ischemic changes.  Assessment/Plan Principal Problem:   Acute CVA (cerebrovascular accident) (HCC) Active Problems:   HTN (hypertension)   Nausea and vomiting   AKI (acute kidney injury) (HCC)   Metabolic acidosis   Acute CVA: Presenting with complaints of lightheadedness and ataxia.  Brain MRI showing a punctate acute cortical infarct within the posterior right frontal lobe.  She has no focal neuro deficits on exam. -Neurology consulted, appreciate recommendations -Telemetry monitoring -Allow for permissive hypertension up to 220/120 -2D echocardiogram -Hemoglobin A1c, fasting lipid panel -Start aspirin 325 p.o. now and daily -Continue home Zocor 40 mg daily.  Given patient's advanced age, will defer decision regarding intensification of statin therapy to neurology. -Frequent neurochecks -PT, OT, speech therapy. -N.p.o. until cleared by bedside swallow evaluation or formal speech evaluation  Addendum: Neurology recommending keeping systolic blood pressure less than 160.  Nausea/emesis: Acute  onset and likely related to CVA.  Patient denies abdominal pain or diarrhea.  Not vomiting at present.  Abdominal exam benign. -Check lipase and LFTs.  Antiemetic as needed.  AKI on CKD stage III: Likely prerenal from dehydration due to nausea/vomiting.  BUN 25, creatinine 1.5.  Baseline creatinine 1.2.  -Gentle IV fluid hydration.  Monitor renal function and urine output.  Avoid nephrotoxic agents.  Hold home lisinopril.  Mild normal anion gap metabolic acidosis: Likely related to AKI.  Bicarb 19, anion gap 15. -Gentle IV fluid hydration and repeat BMP in a.m.  Hypertension: Systolic currently in 140s. -Allow permissive hypertension given acute CVA  Addendum: Patient's blood pressure has increased again and systolic is now in the 190s.  Spoke to neurology, recommending keeping systolic below 160.  As needed labetalol ordered.  Hyperlipidemia -Continue home Zocor 40 mg daily.  Lipid panel ordered.  Hypothyroidism -Continue Synthroid  Gout -Continue allopurinol  DVT prophylaxis: Lovenox (dose adjusted based on renal function) Code Status: Patient wishes to be DNR. Family Communication: Daughter at bedside. Disposition Plan: Status is: Inpatient  Remains inpatient appropriate because:Ongoing diagnostic testing needed not appropriate for outpatient work up and Inpatient level of care appropriate due to severity of illness   Dispo: The patient is from: Home              Anticipated d/c is to: Home              Anticipated d/c date is: 2 days              Patient currently is not medically stable to d/c.  The medical decision making on this patient was of high complexity and the patient is at high risk for clinical deterioration, therefore this is a level 3 visit.  John Giovanni MD [facility]  If 7PM-7AM, please contact night-coverage [email or web address]  06/11/2019, 10:17 PM

## 2019-06-11 NOTE — ED Provider Notes (Signed)
[facility] EMERGENCY [facility]PARTMENT Provider Note   CSN: [id number] Arrival date & time: 06/11/19  [id number]     History Chief Complaint  Patient presents with  . Near Syncope    Sharon Bailey is a 86 y.o. female present for evaluation of lightheadedness/dizziness.  Past medical history significant for bilateral sensorineural hearing loss, OSA, thyroid dysfunction, and HTN.  Reports feeling "strange in her head" for the past hour and feeling woozy after getting up from a seated to standing position around 1500 today.  No fall, LOC, or tunnel vision.  She was relaxing watching movie for 2 hours prior to this and felt like her normal self.  Associated nausea and vomiting (NBNB, food contents from lunch).  Daughter reports her blood pressure was low 200s systolic after she was feeling uneasy at home.  Did take her medications this morning.  Denies any hearing or vision changes, tinnitus, extremity weakness, or facial droop.  Feels like she has a mild headache now.  Does not drink much at home, takes "sips."     Past Medical History:  Diagnosis Date  . Abnormal EKG   . Collagenous colitis   . Collagenous colitis   . Diverticulitis   . Diverticulosis   . Fluttering heart   . GERD (gastroesophageal reflux disease)   . Gout   . Gout attack 09/28/2012  . Hammer toe of left foot 09/28/2012  . Hyperlipidemia   . Hypertension   . Hypothyroidism   . Irregular heart beat   . Pain in joint, ankle and foot 09/28/2012  . Status post foot surgery 10/11/2012  . Thyroid disease     Patient Active Problem List   Diagnosis Date Noted  . Acute CVA (cerebrovascular accident) (HCC) 06/11/2019  . 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 Obstructive sleep apnea 10/31/2018  . Sensorineural hearing loss (SNHL) of both ears 10/31/2018  . Low back pain 09/21/2017  . Chronic fatigue 02/05/2017  . Atypical chest pain 02/05/2017  . Hyperlipidemia 02/05/2017  . Palpitations 04/18/2016    . Essential hypertension 04/18/2016  . Hypertensive heart disease without heart failure 04/18/2016  . Status post foot surgery 10/11/2012  . Gout attack 09/28/2012  . Hammer toe of left foot 09/28/2012  . Pain in joint, ankle and foot 09/28/2012    Past Surgical History:  Procedure Laterality Date  . ABDOMINAL HYSTERECTOMY    . ADENOIDECTOMY    . CHOLECYSTECTOMY    . Excision Ganglion Toe Left 09/29/2012   Lt #2 @ [facility]  . Hammer toe repair Left 09/29/2012   Lt #2 @ [facility]  . TONSILLECTOMY       OB History   No obstetric history on file.     Family History  Problem Relation Age of Onset  . Breast cancer Mother   . Memory loss Father   . Heart disease Sister   . Rheumatic fever Sister   . Lung disease Brother   . Colon cancer Neg Hx     Social History   Tobacco Use  . Smoking status: Never Smoker  . Smokeless tobacco: Never Used  Substance Use Topics  . Alcohol use: No    Alcohol/week: 0.0 standard drinks  . Drug use: No    Home Medications Prior to Admission medications   Medication Sig Start Date End Date Taking? Authorizing Provider  allopurinol (ZYLOPRIM) 300 MG tablet Take 300 mg by mouth daily.   Yes [provider]  amLODipine (NORVASC) 2.5  MG tablet Take 1 tablet (2.5 mg total) by mouth daily. 08/26/18 08/21/19 Yes Jodelle Red, MD  budesonide (ENTOCORT EC) 3 MG 24 hr capsule Take 1 capsule (3 mg total) by mouth daily. 02/22/19  Yes Hilarie Fredrickson, MD  carvedilol (COREG) 25 MG tablet Take 1 tablet (25 mg total) by mouth daily. 03/09/18  Yes Georgie Chard D, NP  ibuprofen (ADVIL,MOTRIN) 200 MG tablet Take 200 mg by mouth every 6 (six) hours as needed (pain).    Yes [provider]  levothyroxine (SYNTHROID) 88 MCG tablet Take 88 mcg by mouth daily before breakfast.   Yes [provider]  lisinopril (ZESTRIL) 40 MG tablet TAKE 1 TABLET BY MOUTH  DAILY Patient taking differently: Take 40 mg by mouth daily.  11/04/18  Yes  Jodelle Red, MD  simvastatin (ZOCOR) 40 MG tablet Take 40 mg by mouth daily.   Yes [provider]  traMADol (ULTRAM) 50 MG tablet Take 1 tablet (50 mg total) by mouth every 12 (twelve) hours as needed. Patient taking differently: Take 50 mg by mouth every 12 (twelve) hours as needed for moderate pain.  05/23/19  Yes Tyrell Antonio, MD  simvastatin (ZOCOR) 20 MG tablet Take 1 tablet (20 mg total) by mouth daily. Patient not taking: Reported on 06/11/2019 08/26/18   Jodelle Red, MD    Allergies    Sulfamethoxazole-trimethoprim  Review of Systems   Review of Systems  Constitutional: Negative for fatigue and fever.  Respiratory: Negative for cough and shortness of breath.   Cardiovascular: Negative for chest pain, palpitations and leg swelling.  Gastrointestinal: Positive for nausea and vomiting. Negative for abdominal pain, constipation and diarrhea.  Genitourinary: Negative for dysuria.  Neurological: Positive for dizziness, light-headedness and headaches.    Physical Exam Updated Vital Signs BP (!) 146/89   Pulse 99   Temp 97.6 F (36.4 C)   Resp 17   Ht 5\' 4"  (1.626 m)   Wt 73.5 kg   SpO2 99%   BMI 27.81 kg/m   Physical Exam Constitutional:      General: She is not in acute distress.    Appearance: Normal appearance.  HENT:     Head: Normocephalic and atraumatic.     Right Ear: Tympanic membrane and ear canal normal.     Left Ear: Tympanic membrane and ear canal normal.     Mouth/Throat:     Mouth: Mucous membranes are dry.  Eyes:     Extraocular Movements: Extraocular movements intact.     Pupils: Pupils are equal, round, and reactive to light.  Cardiovascular:     Rate and Rhythm: Regular rhythm. Tachycardia present.     Pulses: Normal pulses.     Heart sounds: Murmur present.     Comments: 2/6 systolic murmur best heard in left second intercostal space, radiates to carotids Pulmonary:     Effort: Pulmonary effort is normal.      Breath sounds: Normal breath sounds.  Abdominal:     Palpations: Abdomen is soft.     Tenderness: There is no abdominal tenderness.  Musculoskeletal:     Cervical back: Neck supple.  Skin:    General: Skin is warm.  Neurological:     Mental Status: She is alert and oriented to person, place, and time.     Comments: Hints exam normal.  Unidirectional horizontal nystagmus with right lateral gaze.  EOMI, PERRLA.  Follows commands appropriately.  No pronator drift.  5/5 upper and lower extremity strength bilaterally.  Sensation to light touch intact throughout.    ED Results / Procedures / Treatments   Labs (all labs ordered are listed, but only abnormal results are displayed) Labs Reviewed  BASIC METABOLIC PANEL - Abnormal; Notable for the following components:      Result Value   CO2 19 (*)    Glucose, Bld 120 (*)    BUN 25 (*)    Creatinine, Ser 1.52 (*)    GFR calc non Af Amer 31 (*)    GFR calc Af Amer 36 (*)    All other components within normal limits  CBC - Abnormal; Notable for the following components:   WBC 12.0 (*)    Hemoglobin 11.8 (*)    RDW 17.0 (*)    All other components within normal limits  SARS CORONAVIRUS 2 BY RT PCR (HOSPITAL ORDER, PERFORMED IN [facility])  HEMOGLOBIN A1C  LIPID PANEL  TROPONIN I (HIGH SENSITIVITY)  TROPONIN I (HIGH SENSITIVITY)    EKG EKG Interpretation  Date/Time:  Sunday June 11 2019 16:03:49 EDT Ventricular Rate:  126 PR Interval:  144 QRS Duration: 96 QT Interval:  308 QTC Calculation: 446 R Axis:   -62 Text Interpretation: Sinus tachycardia with Premature supraventricular complexes Left axis deviation Left ventricular hypertrophy with repolarization abnormality ( R in aVL , Cornell product , Romhilt-Estes ) Cannot rule out Septal infarct , age undetermined Abnormal ECG rate faster than previous Confirmed by Richardean Canal [phone]) on 06/11/2019 5:13:25 PM   Radiology DG Chest 2 View  Result Date:  06/11/2019 CLINICAL DATA:  Dizziness. Feeling funny in her head 1 hour. Nausea, vomiting, dizziness. EXAM: CHEST - 2 VIEW COMPARISON:  06/27/2010 FINDINGS: Heart size is normal. There is mild prominence of interstitial markings. There are no focal consolidations or pleural effusions. No edema. IMPRESSION: Mild prominence of interstitial markings.  No acute consolidation. Electronically Signed   By: Norva Pavlov M.D.   On: 06/11/2019 16:50   CT Head Wo Contrast  Result Date: 06/11/2019 CLINICAL DATA:  Headache, dizziness, nausea, vomiting EXAM: CT HEAD WITHOUT CONTRAST TECHNIQUE: Contiguous axial images were obtained from the base of the skull through the vertex without intravenous contrast. COMPARISON:  None. FINDINGS: Brain: No evidence of acute infarction, hemorrhage, hydrocephalus, extra-axial collection or mass lesion/mass effect. Periventricular white matter hypodensity. Vascular: No hyperdense vessel or unexpected calcification. Skull: Normal. Negative for fracture or focal lesion. Sinuses/Orbits: No acute finding. Other: None. IMPRESSION: No acute intracranial pathology.  Small-vessel white matter disease. Electronically Signed   By: Lauralyn Primes M.D.   On: 06/11/2019 17:07   MR BRAIN WO CONTRAST  Result Date: 06/11/2019 CLINICAL DATA:  Ataxia, stroke suspected. Additional history provided by scanning technologist: Patient felt "woozy" in her head for 1 hour with nausea and vomiting EXAM: MRI HEAD WITHOUT CONTRAST TECHNIQUE: Multiplanar, multiecho pulse sequences of the brain and surrounding structures were obtained without intravenous contrast. COMPARISON:  Noncontrast head CT performed earlier the same day 06/11/2019 FINDINGS: Brain: There is a punctate acute cortical infarct within the posterior right frontal lobe (series 5, image 92) (series 7, image 53). No acute infarct is identified elsewhere within the brain. Mild to moderate patchy T2/FLAIR hyperintensity within the cerebral white matter is  nonspecific, but consistent with chronic small vessel ischemic disease. Chronic right cerebellar lacunar infarct Mild-to-moderate generalized parenchymal atrophy. No evidence of intracranial mass. No chronic intracranial blood products. No extra-axial fluid collection. No midline shift. Vascular: Expected proximal arterial flow voids. Skull and upper  cervical spine: No focal marrow lesion. Sinuses/Orbits: Visualized orbits show no acute finding. Minimal ethmoid sinus mucosal thickening. Trace bilateral mastoid effusions. IMPRESSION: Punctate acute cortical infarct within the posterior right frontal lobe. Mild-to-moderate generalized parenchymal atrophy and chronic small vessel ischemic disease. Chronic right cerebellar lacunar infarct. Minimal ethmoid sinus mucosal thickening. Trace bilateral mastoid effusions. Electronically Signed   By: Kellie Simmering DO   On: 06/11/2019 20:08    Procedures Procedures (including critical care time)  Medications Ordered in ED Medications  sodium chloride flush (NS) 0.9 % injection 3 mL (has no administration in time range)  lactated ringers infusion ( Intravenous New Bag/Given 06/11/19 2114)   stroke: mapping our early stages of recovery book (0 each Does not apply Hold 06/11/19 2133)  acetaminophen (TYLENOL) tablet 650 mg (has no administration in time range)    Or  acetaminophen (TYLENOL) 160 MG/5ML solution 650 mg (has no administration in time range)    Or  acetaminophen (TYLENOL) suppository 650 mg (has no administration in time range)  enoxaparin (LOVENOX) injection 30 mg (has no administration in time range)  aspirin suppository 300 mg (has no administration in time range)    Or  aspirin tablet 325 mg (has no administration in time range)  ondansetron (ZOFRAN) injection 4 mg (has no administration in time range)  meclizine (ANTIVERT) tablet 25 mg (25 mg Oral Given 06/11/19 2110)  sodium chloride 0.9 % bolus 500 mL (0 mLs Intravenous Stopped 06/11/19 2145)    LORazepam (ATIVAN) injection 1 mg (1 mg Intravenous Given 06/11/19 1923)    ED Course  I have reviewed the triage vital signs and the nursing notes.  Pertinent labs & imaging results that were available during my care of the patient were reviewed by me and considered in my medical decision making (see chart for details).  Clinical Course as of Jun 10 2199  Sun Jun 11, 2019  2029 Acute punctate right frontal infarct on MRI.  Spoke with Dr. Malen Gauze, neurology.  Felt this particular location would not explain her symptoms.  Will evaluate and obtain stroke work-up.   [SB]  2126 Spoke with hospitalist, will evaluate for admission.   [SB]    Clinical Course User Index [SB] Patriciaann Clan, DO   MDM Rules/Calculators/A&P                      86 year old female with a history of hypertension and hyperlipidemia presenting for evaluation of acute onset continuous lightheadedness/dizziness with associated nausea and vomiting.  Significantly hypertensive on arrival in [phone] systolic despite taking her antihypertensives this morning. Initial differential included orthostatic hypotension, dehydration, BPPV/vestibular neuritis, posterior CVA, arrhythmia.  Neurologically intact on exam with mild horizontal nystagmus on right lateral gaze.  EKG showing sinus tachycardia. CBC and BMP remarkable for mild AKI of 1.5, however anemia baseline.  Provided with IVF.  Obtained CT head and MRI due to age and risk factors, showed acute punctate infarct within the posterior right frontal lobe.  Spoke with neurology, Dr. Malen Gauze, who felt her symptoms would be atypical for this infarct location, however recommended appropriate stroke work-up.  Ultimately suspect her symptoms are multifactorial in the setting of significant hypertension and dehydration with age-related orthostasis.  Does have systolic murmur on exam, ?contributing aortic stenosis.  Consulted hospitalist for admission for stroke work-up and fluid  resuscitation, will evaluate and admit.  Final Clinical Impression(s) / ED Diagnoses Final diagnoses:  Cerebrovascular accident (CVA), unspecified mechanism ([place])  Dizziness  Rx / DC Orders ED Discharge Orders    None     Allayne Stack, DO  Family Medicine PGY-2    Allayne Stack, DO 06/11/19 2201    Charlynne Pander, MD 06/11/19 2230

## 2019-06-11 NOTE — ED Notes (Signed)
Pt to MRI

## 2019-06-11 NOTE — ED Notes (Signed)
Per MD Loney Loh, she is okay with pt having Blood Pressure cycled q1hr.

## 2019-06-11 NOTE — ED Triage Notes (Signed)
The pt is c/o feeling woozey and strange in her head for one hour with nausea and vomiting

## 2019-06-11 NOTE — ED Notes (Signed)
Dr Jeraldine Loots    Has  Ordered  A c-t scan of her head

## 2019-06-11 NOTE — ED Notes (Signed)
The pt vomited in triage  She feels better but still feeels funny in her head with dizziness   Light headache now

## 2019-06-11 NOTE — Consult Note (Signed)
Neurology Consultation  Reason for Consult: Stroke on MRI Referring Physician: Dr. Chaney Malling, EDP  CC: Headache  History is obtained from: Chart, patient  HPI: Sharon Bailey is a 86 y.o. female with past medical history of hypertension, hyperlipidemia, history of irregular heartbeat but not on any anticoagulation, presented to the emergency room for evaluation of feeling woozy. According to the patient, she was fine this morning, went to the church, came to her daughter's house and started becoming lightheaded along with some gait difficulty while walking to the bathroom. She also had an episode of nausea and vomiting. No facial droop.  No slurred speech.  No word finding difficulty.  No tingling numbness or weakness. No prior history of strokes. Denies any exposure to Covid patients.  Denies any cough fevers chills shortness of breath. Noted to be hypertensive with a systolic blood pressures in the 180s to 190s in the ER. Due to sudden onset of her neurological symptoms, MRI brain was obtained that showed a very small punctate stroke in the right frontal area, after which neurological consultation was called. She denies any strokelike symptoms as above.    LKW: 3 PM on 06/11/2019 tpa given?: no, initial evaluation by EDP did not reveal any focal deficits.  Neurology was consulted after she was outside the window  Premorbid modified Rankin scale (mRS): 0-lives independently, performs all ADLs herself, drives.  ROS: Performed and negative except noted in HPI  Past Medical History:  Diagnosis Date   Abnormal EKG    Collagenous colitis    Collagenous colitis    Diverticulitis    Diverticulosis    Fluttering heart    GERD (gastroesophageal reflux disease)    Gout    Gout attack 09/28/2012   Hammer toe of left foot 09/28/2012   Hyperlipidemia    Hypertension    Hypothyroidism    Irregular heart beat    Pain in joint, ankle and foot 09/28/2012   Status post foot  surgery 10/11/2012   Thyroid disease    Family History  Problem Relation Age of Onset   Breast cancer Mother    Memory loss Father    Heart disease Sister    Rheumatic fever Sister    Lung disease Brother    Colon cancer Neg Hx     Social History:   reports that she has never smoked. She has never used smokeless tobacco. She reports that she does not drink alcohol or use drugs.  Medications  Current Facility-Administered Medications:     stroke: mapping our early stages of recovery book, , Does not apply, Once, John Giovanni, MD, Stopped at 06/11/19 2133   0.9 %  sodium chloride infusion, , Intravenous, Continuous, John Giovanni, MD, Stopped at 06/11/19 2244   acetaminophen (TYLENOL) tablet 650 mg, 650 mg, Oral, Q4H PRN **OR** acetaminophen (TYLENOL) 160 MG/5ML solution 650 mg, 650 mg, Per Tube, Q4H PRN **OR** acetaminophen (TYLENOL) suppository 650 mg, 650 mg, Rectal, Q4H PRN, John Giovanni, MD   [START ON 06/12/2019] allopurinol (ZYLOPRIM) tablet 300 mg, 300 mg, Oral, Daily, John Giovanni, MD   aspirin suppository 300 mg, 300 mg, Rectal, Daily **OR** aspirin tablet 325 mg, 325 mg, Oral, Daily, John Giovanni, MD   [START ON 06/12/2019] budesonide (ENTOCORT EC) 24 hr capsule 3 mg, 3 mg, Oral, Daily, Rathore, Vasundhra, MD   enoxaparin (LOVENOX) injection 30 mg, 30 mg, Subcutaneous, Q24H, John Giovanni, MD   lactated ringers infusion, , Intravenous, Continuous, John Giovanni, MD, Last Rate: 100 mL/hr at  06/11/19 2114, New Bag at 06/11/19 2114   [START ON 06/12/2019] levothyroxine (SYNTHROID) tablet 88 mcg, 88 mcg, Oral, QAC breakfast, John Giovanni, MD   ondansetron [facility]) injection 4 mg, 4 mg, Intravenous, Q6H PRN, John Giovanni, MD   [START ON 06/12/2019] simvastatin (ZOCOR) tablet 40 mg, 40 mg, Oral, q1800, John Giovanni, MD   sodium chloride flush (NS) 0.9 % injection 3 mL, 3 mL, Intravenous, Once, John Giovanni,  MD  Current Outpatient Medications:    allopurinol (ZYLOPRIM) 300 MG tablet, Take 300 mg by mouth daily., Disp: , Rfl:    amLODipine (NORVASC) 2.5 MG tablet, Take 1 tablet (2.5 mg total) by mouth daily., Disp: 30 tablet, Rfl: 11   budesonide (ENTOCORT EC) 3 MG 24 hr capsule, Take 1 capsule (3 mg total) by mouth daily., Disp: 270 capsule, Rfl: 3   carvedilol (COREG) 25 MG tablet, Take 1 tablet (25 mg total) by mouth daily., Disp: 60 tablet, Rfl: 6   ibuprofen (ADVIL,MOTRIN) 200 MG tablet, Take 200 mg by mouth every 6 (six) hours as needed (pain). , Disp: , Rfl:    levothyroxine (SYNTHROID) 88 MCG tablet, Take 88 mcg by mouth daily before breakfast., Disp: , Rfl:    lisinopril (ZESTRIL) 40 MG tablet, TAKE 1 TABLET BY MOUTH  DAILY (Patient taking differently: Take 40 mg by mouth daily. ), Disp: 90 tablet, Rfl: 1   simvastatin (ZOCOR) 40 MG tablet, Take 40 mg by mouth daily., Disp: , Rfl:    traMADol (ULTRAM) 50 MG tablet, Take 1 tablet (50 mg total) by mouth every 12 (twelve) hours as needed. (Patient taking differently: Take 50 mg by mouth every 12 (twelve) hours as needed for moderate pain. ), Disp: 30 tablet, Rfl: 0   simvastatin (ZOCOR) 20 MG tablet, Take 1 tablet (20 mg total) by mouth daily. (Patient not taking: Reported on 06/11/2019), Disp: 30 tablet, Rfl: 11  Exam: Current vital signs: BP (!) 199/60    Pulse (!) 104    Temp 97.6 F (36.4 C)    Resp (!) 30    Ht 5\' 4"  (1.626 m)    Wt 73.5 kg    SpO2 99%    BMI 27.81 kg/m  Vital signs in last 24 hours: Temp:  [97.6 F (36.4 C)] 97.6 F (36.4 C) (06/06 1615) Pulse Rate:  [46-116] 104 (06/06 2230) Resp:  [17-30] 30 (06/06 2230) BP: (146-199)/(60-143) 199/60 (06/06 2200) SpO2:  [90 %-99 %] 99 % (06/06 2230) Weight:  [73.5 kg] 73.5 kg (06/06 1609) General: Awake alert in no distress HEENT: Normocephalic atraumatic Chest:Clear to auscultation Cardiovascular: Regular rhythm Abdomen soft nondistended nontender Extremities:  Multiple petechiae under the skin. Neurological exam Awake alert oriented x3 Speech is not dysarthric No evidence of aphasia Cranial nerves: Pupils equal round reactive to light, extraocular movements intact, visual fields full, face symmetric, tongue and palate midline. Motor exam: No drift in any of the 4 extremities-all are 5/5. Sensory exam no extinction, no sensory deficits. Coordination: No ataxia noted. NIH stroke scale 0  Labs I have reviewed labs in epic and the results pertinent to this consultation are:  CBC    Component Value Date/Time   WBC 12.0 (H) 06/11/2019 1617   RBC 4.41 06/11/2019 1617   HGB 11.8 (L) 06/11/2019 1617   HCT 38.6 06/11/2019 1617   PLT 237 06/11/2019 1617   MCV 87.5 06/11/2019 1617   MCH 26.8 06/11/2019 1617   MCHC 30.6 06/11/2019 1617   RDW 17.0 (H) 06/11/2019 1617  LYMPHSABS 2.0 12/17/2014 1056   MONOABS 0.6 12/17/2014 1056   EOSABS 0.7 12/17/2014 1056   BASOSABS 0.1 12/17/2014 1056    CMP     Component Value Date/Time   NA 144 06/11/2019 1617   NA 142 03/09/2018 1211   K 3.8 06/11/2019 1617   CL 110 06/11/2019 1617   CO2 19 (L) 06/11/2019 1617   GLUCOSE 120 (H) 06/11/2019 1617   BUN 25 (H) 06/11/2019 1617   BUN 20 03/09/2018 1211   CREATININE 1.52 (H) 06/11/2019 1617   CALCIUM 9.4 06/11/2019 1617   PROT 7.2 10/07/2014 1806   ALBUMIN 3.8 10/07/2014 1806   AST 23 10/07/2014 1806   ALT 12 (L) 10/07/2014 1806   ALKPHOS 53 10/07/2014 1806   BILITOT 0.6 10/07/2014 1806   GFRNONAA 31 (L) 06/11/2019 1617   GFRAA 36 (L) 06/11/2019 1617   Imaging I have reviewed the images obtained:  CT-scan of the brain-no acute changes MRI brain-punctate right frontal stroke.  Unclear if that is an actual stroke because I am not convinced with the ADC map correlate.   Assessment:  86 year old who complained of lightheadedness and was brought to the ER for emergent evaluation, had a brain MRI that showed a punctate area of restricted  diffusion-which in my opinion does not look fully convincing due to no ADC map correlate-but nonetheless given her age and risk factors, it is worth doing a stroke risk factor work-up to prevent any further strokes in the future.. Symptoms are more likely related to hypertensive emergency-systolics were in the 024O to 190s on presentation.   Impression: Hypertensive emergency Acute ischemic stroke-likely incidental but requires work-up.  Recommendations: Admit to hospitalist Frequent neurochecks Telemetry Aspirin 325 Atorvastatin 80 Routine CTA head and neck 2D echo A1c Lipid panel As for blood pressure-symptoms likely hypertensive emergency but given the presence of stroke, I would say start bringing pressures down no more than 20% as she has been at around 180.  I would recommend a goal of 160 and below for today.  Gradually reduce pressures to normal in the next 48 to 72 hours with a goal at discharge of 140/90. PT OT Speech therapy Answered all questions with the daughter Discussed with Dr. Brock Bad. Stroke neurology will follow.  -- Amie Portland, MD [facility] Pager: [phone] If 7pm to 7am, please call on call as listed on AMION.

## 2019-06-12 ENCOUNTER — Inpatient Hospital Stay (HOSPITAL_COMMUNITY): Payer: Medicare PPO

## 2019-06-12 ENCOUNTER — Encounter (HOSPITAL_COMMUNITY): Payer: Self-pay | Admitting: Internal Medicine

## 2019-06-12 ENCOUNTER — Telehealth: Payer: Self-pay | Admitting: Physical Medicine and Rehabilitation

## 2019-06-12 DIAGNOSIS — I361 Nonrheumatic tricuspid (valve) insufficiency: Secondary | ICD-10-CM

## 2019-06-12 DIAGNOSIS — I639 Cerebral infarction, unspecified: Secondary | ICD-10-CM

## 2019-06-12 DIAGNOSIS — I350 Nonrheumatic aortic (valve) stenosis: Secondary | ICD-10-CM

## 2019-06-12 DIAGNOSIS — I161 Hypertensive emergency: Secondary | ICD-10-CM | POA: Diagnosis present

## 2019-06-12 LAB — BASIC METABOLIC PANEL
Anion gap: 9 (ref 5–15)
BUN: 17 mg/dL (ref 8–23)
CO2: 19 mmol/L — ABNORMAL LOW (ref 22–32)
Calcium: 9 mg/dL (ref 8.9–10.3)
Chloride: 114 mmol/L — ABNORMAL HIGH (ref 98–111)
Creatinine, Ser: 1.12 mg/dL — ABNORMAL HIGH (ref 0.44–1.00)
GFR calc Af Amer: 52 mL/min — ABNORMAL LOW (ref >60)
GFR calc non Af Amer: 44 mL/min — ABNORMAL LOW (ref >60)
Glucose, Bld: 85 mg/dL (ref 70–99)
Potassium: 3.8 mmol/L (ref 3.5–5.1)
Sodium: 142 mmol/L (ref 135–145)

## 2019-06-12 LAB — ECHOCARDIOGRAM COMPLETE
Height: 64 in
Weight: 2592.61 oz

## 2019-06-12 LAB — LIPID PANEL
Cholesterol: 140 mg/dL (ref 0–200)
HDL: 47 mg/dL (ref >40)
LDL Cholesterol: 72 mg/dL (ref 0–99)
Total CHOL/HDL Ratio: 3 RATIO
Triglycerides: 105 mg/dL (ref <150)
VLDL: 21 mg/dL (ref 0–40)

## 2019-06-12 LAB — HEMOGLOBIN A1C
Hgb A1c MFr Bld: 5.7 % — ABNORMAL HIGH (ref 4.8–5.6)
Mean Plasma Glucose: 116.89 mg/dL

## 2019-06-12 MED ORDER — ASCORBIC ACID 500 MG PO TABS: 1000.0000 mg | ORAL_TABLET | Freq: Every day | ORAL | Status: DC

## 2019-06-12 MED ORDER — ASCORBIC ACID 500 MG PO TABS
1000.0000 mg | ORAL_TABLET | Freq: Every day | ORAL | Status: DC
  Administered 2019-06-13: 1000 mg via ORAL
  Filled 2019-06-12 (×2): qty 2

## 2019-06-12 MED ORDER — CARVEDILOL 12.5 MG PO TABS
12.5000 mg | ORAL_TABLET | Freq: Two times a day (BID) | ORAL | Status: DC
  Administered 2019-06-12 – 2019-06-13 (×2): 12.5 mg via ORAL
  Filled 2019-06-12 (×2): qty 1

## 2019-06-12 MED ORDER — ASPIRIN EC 81 MG PO TBEC
81.0000 mg | DELAYED_RELEASE_TABLET | Freq: Every day | ORAL | Status: DC
  Administered 2019-06-13: 81 mg via ORAL
  Filled 2019-06-12 (×2): qty 1

## 2019-06-12 MED ORDER — CARVEDILOL 12.5 MG PO TABS: 12.5000 mg | ORAL_TABLET | Freq: Two times a day (BID) | ORAL | Status: DC

## 2019-06-12 MED ORDER — HYDRALAZINE HCL 20 MG/ML IJ SOLN: 10.0000 mg | INTRAMUSCULAR | Status: DC | PRN

## 2019-06-12 MED ORDER — AMLODIPINE BESYLATE 5 MG PO TABS
5.0000 mg | ORAL_TABLET | Freq: Every day | ORAL | Status: DC
  Administered 2019-06-12 – 2019-06-13 (×2): 5 mg via ORAL
  Filled 2019-06-12 (×3): qty 1

## 2019-06-12 MED ORDER — HYDRALAZINE HCL 10 MG PO TABS: 10.0000 mg | ORAL_TABLET | Freq: Four times a day (QID) | ORAL | Status: DC | PRN

## 2019-06-12 MED ORDER — HYDRALAZINE HCL 20 MG/ML IJ SOLN
5.0000 mg | INTRAMUSCULAR | Status: DC | PRN
  Administered 2019-06-12: 5 mg via INTRAVENOUS
  Filled 2019-06-12: qty 1

## 2019-06-12 MED ORDER — ENOXAPARIN SODIUM 40 MG/0.4ML ~~LOC~~ SOLN
40.0000 mg | SUBCUTANEOUS | Status: DC
  Administered 2019-06-12: 40 mg via SUBCUTANEOUS
  Filled 2019-06-12: qty 0.4

## 2019-06-12 NOTE — ED Notes (Signed)
Report called to 5C 

## 2019-06-12 NOTE — Progress Notes (Signed)
STROKE TEAM PROGRESS NOTE   INTERVAL HISTORY I personally reviewed history of presenting illness with the patient and her daughter.  She presented with nausea dizziness and gait ataxia in the setting of significantly elevated blood pressure.  She denies any slurred speech or focal left-sided weakness or numbness.  MRI scan shows a tiny punctate right frontal cortical acute infarct which is likely incidental and unrelated to her symptoms.  Echocardiogram, Doppler studies and lipid profile and hemoglobin A1c are pending  Vitals:   06/12/19 0612 06/12/19 0930 06/12/19 0936 06/12/19 0953  BP: (!) 170/71 (!) 211/72 (!) 198/79 (!) 196/74  Pulse: 82 90 89 90  Resp: 18     Temp: 97.7 F (36.5 C) 97.9 F (36.6 C)    TempSrc: Oral Oral    SpO2: 98% 99% 99% 99%  Weight:      Height:        CBC:  Recent Labs  Lab 06/11/19 1617  WBC 12.0*  HGB 11.8*  HCT 38.6  MCV 87.5  PLT 237    Basic Metabolic Panel:  Recent Labs  Lab 06/11/19 1617 06/12/19 0711  NA 144 142  K 3.8 3.8  CL 110 114*  CO2 19* 19*  GLUCOSE 120* 85  BUN 25* 17  CREATININE 1.52* 1.12*  CALCIUM 9.4 9.0   Lipid Panel:     Component Value Date/Time   CHOL 140 06/12/2019 0711   TRIG 105 06/12/2019 0711   HDL 47 06/12/2019 0711   CHOLHDL 3.0 06/12/2019 0711   VLDL 21 06/12/2019 0711   LDLCALC 72 06/12/2019 0711   HgbA1c:  Lab Results  Component Value Date   HGBA1C 5.7 (H) 06/12/2019   Urine Drug Screen: No results found for: LABOPIA, COCAINSCRNUR, LABBENZ, AMPHETMU, THCU, LABBARB  Alcohol Level No results found for: ETH  IMAGING past 24 hours DG Chest 2 View  Result Date: 06/11/2019 CLINICAL DATA:  Dizziness. Feeling funny in her head 1 hour. Nausea, vomiting, dizziness. EXAM: CHEST - 2 VIEW COMPARISON:  06/27/2010 FINDINGS: Heart size is normal. There is mild prominence of interstitial markings. There are no focal consolidations or pleural effusions. No edema. IMPRESSION: Mild prominence of interstitial  markings.  No acute consolidation. Electronically Signed   By: Norva Pavlov M.D.   On: 06/11/2019 16:50   CT Head Wo Contrast  Result Date: 06/11/2019 CLINICAL DATA:  Headache, dizziness, nausea, vomiting EXAM: CT HEAD WITHOUT CONTRAST TECHNIQUE: Contiguous axial images were obtained from the base of the skull through the vertex without intravenous contrast. COMPARISON:  None. FINDINGS: Brain: No evidence of acute infarction, hemorrhage, hydrocephalus, extra-axial collection or mass lesion/mass effect. Periventricular white matter hypodensity. Vascular: No hyperdense vessel or unexpected calcification. Skull: Normal. Negative for fracture or focal lesion. Sinuses/Orbits: No acute finding. Other: None. IMPRESSION: No acute intracranial pathology.  Small-vessel white matter disease. Electronically Signed   By: Lauralyn Primes M.D.   On: 06/11/2019 17:07   MR BRAIN WO CONTRAST  Result Date: 06/11/2019 CLINICAL DATA:  Ataxia, stroke suspected. Additional history provided by scanning technologist: Patient felt "woozy" in her head for 1 hour with nausea and vomiting EXAM: MRI HEAD WITHOUT CONTRAST TECHNIQUE: Multiplanar, multiecho pulse sequences of the brain and surrounding structures were obtained without intravenous contrast. COMPARISON:  Noncontrast head CT performed earlier the same day 06/11/2019 FINDINGS: Brain: There is a punctate acute cortical infarct within the posterior right frontal lobe (series 5, image 92) (series 7, image 53). No acute infarct is identified elsewhere within the brain.  Mild to moderate patchy T2/FLAIR hyperintensity within the cerebral white matter is nonspecific, but consistent with chronic small vessel ischemic disease. Chronic right cerebellar lacunar infarct Mild-to-moderate generalized parenchymal atrophy. No evidence of intracranial mass. No chronic intracranial blood products. No extra-axial fluid collection. No midline shift. Vascular: Expected proximal arterial flow voids.  Skull and upper cervical spine: No focal marrow lesion. Sinuses/Orbits: Visualized orbits show no acute finding. Minimal ethmoid sinus mucosal thickening. Trace bilateral mastoid effusions. IMPRESSION: Punctate acute cortical infarct within the posterior right frontal lobe. Mild-to-moderate generalized parenchymal atrophy and chronic small vessel ischemic disease. Chronic right cerebellar lacunar infarct. Minimal ethmoid sinus mucosal thickening. Trace bilateral mastoid effusions. Electronically Signed   By: Jackey Loge DO   On: 06/11/2019 20:08   ECHOCARDIOGRAM COMPLETE  Result Date: 06/12/2019    ECHOCARDIOGRAM REPORT   Patient Name:   Sharon Bailey Date of Exam: 06/12/2019 Medical Rec #:  [id number]      Height:       64.0 in Accession #:    [id number]     Weight:       162.0 lb Date of Birth:  [birth year]      BSA:          1.789 m Patient Age:    86 years       BP:           170/71 mmHg Patient Gender: F              HR:           81 bpm. Exam Location:  Inpatient Procedure: 2D Echo, Cardiac Doppler and Color Doppler Indications:    Stroke  History:        Patient has prior history of Echocardiogram examinations, most                 recent 04/30/2016. Risk Factors:Hypertension, Dyslipidemia and                 Sleep Apnea.  Sonographer:    Ross Ludwig RDCS (AE) Referring Phys: [id number] VASUNDHRA RATHORE IMPRESSIONS  1. Left ventricular ejection fraction, by estimation, is 65 to 70%. The left ventricle has normal function. The left ventricle has no regional wall motion abnormalities. There is severe left ventricular hypertrophy. Left ventricular diastolic parameters  are consistent with Grade I diastolic dysfunction (impaired relaxation). Elevated left ventricular end-diastolic pressure.  2. Right ventricular systolic function is normal. The right ventricular size is normal. There is mildly elevated pulmonary artery systolic pressure.  3. The mitral valve is grossly normal. Trivial mitral valve regurgitation.  4.  The aortic valve is tricuspid. Aortic valve regurgitation is trivial. Mild aortic valve stenosis. Aortic valve area, by VTI measures 1.67 cm. Aortic valve mean gradient measures 11.0 mmHg. Aortic valve Vmax measures 2.50 m/s.  5. The inferior vena cava is normal in size with <50% respiratory variability, suggesting right atrial pressure of 8 mmHg. FINDINGS  Left Ventricle: Left ventricular ejection fraction, by estimation, is 65 to 70%. The left ventricle has normal function. The left ventricle has no regional wall motion abnormalities. The left ventricular internal cavity size was normal in size. There is  severe left ventricular hypertrophy. Left ventricular diastolic parameters are consistent with Grade I diastolic dysfunction (impaired relaxation). Elevated left ventricular end-diastolic pressure. Right Ventricle: The right ventricular size is normal. No increase in right ventricular wall thickness. Right ventricular systolic function is normal. There is mildly elevated pulmonary artery systolic pressure. The tricuspid regurgitant velocity  is 3.01  m/s, and with an assumed right atrial pressure of 8 mmHg, the estimated right ventricular systolic pressure is 44.2 mmHg. Left Atrium: Left atrial size was normal in size. Right Atrium: Right atrial size was normal in size. Pericardium: There is no evidence of pericardial effusion. Mitral Valve: The mitral valve is grossly normal. Trivial mitral valve regurgitation. MV peak gradient, 8.0 mmHg. The mean mitral valve gradient is 2.0 mmHg. Tricuspid Valve: The tricuspid valve is grossly normal. Tricuspid valve regurgitation is mild. Aortic Valve: The aortic valve is tricuspid. Aortic valve regurgitation is trivial. Mild aortic stenosis is present. Aortic valve mean gradient measures 11.0 mmHg. Aortic valve peak gradient measures 25.0 mmHg. Aortic valve area, by VTI measures 1.67 cm. Pulmonic Valve: The pulmonic valve was grossly normal. Pulmonic valve regurgitation is  trivial. Aorta: The aortic root and ascending aorta are structurally normal, with no evidence of dilitation. Venous: The inferior vena cava is normal in size with less than 50% respiratory variability, suggesting right atrial pressure of 8 mmHg. IAS/Shunts: No atrial level shunt detected by color flow Doppler.  LEFT VENTRICLE PLAX 2D LVIDd:         3.45 cm  Diastology LVIDs:         2.22 cm  LV e' lateral:   5.55 cm/s LV PW:         1.54 cm  LV E/e' lateral: 15.1 LV IVS:        2.01 cm  LV e' medial:    4.24 cm/s LVOT diam:     1.90 cm  LV E/e' medial:  19.7 LV SV:         80 LV SV Index:   45 LVOT Area:     2.84 cm  RIGHT VENTRICLE             IVC RV S prime:     14.70 cm/s  IVC diam: 1.95 cm TAPSE (M-mode): 2.4 cm LEFT ATRIUM             Index       RIGHT ATRIUM           Index LA diam:        2.90 cm 1.62 cm/m  RA Area:     16.90 cm LA Vol (A2C):   53.9 ml 30.13 ml/m RA Volume:   51.20 ml  28.62 ml/m LA Vol (A4C):   36.6 ml 20.46 ml/m LA Biplane Vol: 46.6 ml 26.05 ml/m  AORTIC VALVE AV Area (Vmax):    1.61 cm AV Area (Vmean):   1.96 cm AV Area (VTI):     1.67 cm AV Vmax:           250.00 cm/s AV Vmean:          152.000 cm/s AV VTI:            0.481 m AV Peak Grad:      25.0 mmHg AV Mean Grad:      11.0 mmHg LVOT Vmax:         142.00 cm/s LVOT Vmean:        105.000 cm/s LVOT VTI:          0.283 m LVOT/AV VTI ratio: 0.59  AORTA Ao Root diam: 3.10 cm MITRAL VALVE                TRICUSPID VALVE MV Area (PHT): 3.16 cm     TR Peak grad:   36.2 mmHg MV Peak grad:  8.0  mmHg     TR Vmax:        301.00 cm/s MV Mean grad:  2.0 mmHg MV Vmax:       1.41 m/s     SHUNTS MV Vmean:      63.5 cm/s    Systemic VTI:  0.28 m MV Decel Time: 240 msec     Systemic Diam: 1.90 cm MV E velocity: 83.60 cm/s MV A velocity: 126.00 cm/s MV E/A ratio:  0.66 Zoila Shutter MD Electronically signed by Zoila Shutter MD Signature Date/Time: 06/12/2019/10:27:20 AM    Final     PHYSICAL EXAM Pleasant elderly Caucasian lady not in  distress. . Afebrile. Head is nontraumatic. Neck is supple without bruit.    Cardiac exam no murmur or gallop. Lungs are clear to auscultation. Distal pulses are well felt. Neurological Exam ;  Awake  Alert oriented x 3. Normal speech and language.eye movements full without nystagmus.fundi were not visualized. Vision acuity and fields appear normal. Hearing is diminished bilaterally.. Palatal movements are normal. Face symmetric. Tongue midline. Normal strength, tone, reflexes and coordination. Normal sensation. Gait deferred.  ASSESSMENT/PLAN Sharon Bailey is a 86 y.o. female with history of HTN, HLD, arrhythmia not on AC presenting "feeling woozy" with gait difficulties with nausea and vomiting.    Stroke:   Incidental Punctate R frontal lobe infarct secondary to small vessel disease vs embolic source in a patient who presented with hypertensive crisis and dizziness nausea and gait ataxia related to it  CT head No acute abnormality. Small vessel disease. Atrophy.   MRI  Punctate cortical infarct posterior R frontal lobe. Small vessel disease. Atrophy. Old R cerebellar lacune. Sinus dz  TCD absent bitemporal and occipital windows. Antegrade flow B opthalmics, carotid siphons, VAs and BA.   Carotid Doppler  B ICA 1-39% stenosis, VAs antegrade   2D Echo EF 65-70%. No source of embolus   LDL 72  HgbA1c 5.7  Lovenox 40 mg sq daily for VTE prophylaxis  No antithrombotic prior to admission, now on aspirin 325 mg daily. Decrease aspirin to 81 mg daily and continue at d/c    Therapy recommendations:  OP PT  Disposition:  Return home  Consider OP 30d monitor to look for AF  Hypertensive Urgency  BP as high as 208/80  Remains elevated 190-210s today . Permissive hypertension (OK if < 220/120) but gradually normalize in 5-7 days . Long-term BP goal normotensive  Hyperlipidemia  Home meds:  zocor 40, resumed in hospital  LDL 72, goal < 70  Will not given additional statin  given advanced age and LDL barely above goal  Continue statin at discharge  Other Stroke Risk Factors  Advanced age  Overweight, Body mass index is 27.81 kg/m., recommend weight loss, diet and exercise as appropriate   Other Active Problems  AKI on CKD stage III  Mild normal anion gap metabolic acidosis  Hypothyroidism   Gout   Baseline anxiety per daughter  Hospital day # 1 She presented with dizziness, nausea and ataxia's in the setting of significantly elevated blood pressure and is now feeling better.  MRI shows likely incidental asymptomatic tiny right frontal infarct etiology small vessel disease versus embolic.  Recommend aspirin 81 mg daily alone and continue cardiac monitoring while in the hospital and recommend outpatient 30-day heart monitor for paroxysmal A. fib.  Aggressive risk factor modification.  Long discussion with patient and daughter and answered questions.  Discussed with Dr. Cyndie Chime.  Greater than 50% time during  this 35-minute visit was spent on counseling and coordination of care about stroke and answering questions. Sharon Contras, MD To contact Stroke Continuity provider, please refer to [email or web address] After hours, contact General Neurology

## 2019-06-12 NOTE — Progress Notes (Signed)
  Echocardiogram 2D Echocardiogram has been performed.  Gerda Diss 06/12/2019, 9:14 AM

## 2019-06-12 NOTE — Progress Notes (Signed)
Progress Note    Sharon Bailey  ZOX:[id number] DOB: October 25, 1930  DOA: 06/11/2019 PCP: Clovis Riley, L.August Saucer, MD    Brief Narrative:    Medical records reviewed and are as summarized below:  Sharon Bailey is an 86 y.o. female with a past medical history that includes bilateral sensorineural hearing loss, hypertension, lipidemia, GERD admitted June 6 lightheadedness and unsteady gait.  In the emergency department included an MRI of the brain that showed small punctate stroke in the right frontal area hypertension.  He was evaluated by neurology who recommended admission for stroke work-up  Assessment/Plan:   Principal Problem:   Acute CVA (cerebrovascular accident) [facility]) Active Problems:   Hypertensive emergency   HTN (hypertension)   Nausea and vomiting   AKI (acute kidney injury) (HCC)   Metabolic acidosis   Hyperlipidemia   Sensorineural hearing loss (SNHL) of both ears  #1.  Acute CVA.  MRI reveals punctate acute cortical infarct within the posterior right frontal lobe.  CT of the head with no acute intracranial pathology, small vessel white matter disease.  Carotid Doppler B ICA 1-39% stenosis, VAs antegrade, 2D echo with an EF of 65 to 70%, LDL 72, hemoglobin A1c 5.7.  Her home statin was continued and asa started.  Evaluated by physical therapy who recommended outpatient PT. evaluated by neurology in the emergency department who opined symptoms likely related to hypertensive emergency but given MRI results recommended bringing blood pressure down no more than 20%. Symptoms have resolved -await stroke team recs  #2.  Acute kidney injuries imposed on stage III chronic kidney.  Trending down. 1.12 close to baseline.  BUN within the limits of normal -Hold nephrotoxins -Urine output -Outpatient follow-up  #3.Mild normal anion gap metabolic acidosis: Likely related to AKI.  Bicarb 19, anion gap 9 -she received  Received IV fluid hydration with some improvement this am. Taking pos  better -recheck in am  #4.  Hypertensive emergency.  Home medications include amlodipine, Coreg, lisinopril.  Evaluated by neurology who recommended bringing blood pressure down slowly. SBP 190-200 -will resume norvasc at higher dose -will resume home coreg lower dose bid (vs daily) -prn hydralazine -monitor closely  #5.  Hyperlipidemia.  See #1. -Continue Zocor  #6.  Hypothyroidism  -continue home med    Family Communication/Anticipated D/C date and plan/Code Status   DVT prophylaxis: Lovenox ordered. Code Status: Full Code.  Family Communication: daughter at bedside Disposition Plan: Status is: Inpatient  Remains inpatient appropriate because:Inpatient level of care appropriate due to severity of illness   Dispo: The patient is from: Home              Anticipated d/c is to: Home              Anticipated d/c date is: 1 day              Patient currently is not medically stable to d/c.         Medical Consultants:    [facility] neurology   Anti-Infectives:    None  Subjective:   Denies pain/discomfort. Requests permission to ambulate to bathroom (vs periwick)  Objective:    Vitals:   06/12/19 0612 06/12/19 0930 06/12/19 0936 06/12/19 0953  BP: (!) 170/71 (!) 211/72 (!) 198/79 (!) 196/74  Pulse: 82 90 89 90  Resp: 18     Temp: 97.7 F (36.5 C) 97.9 F (36.6 C)    TempSrc: Oral Oral    SpO2: 98% 99% 99% 99%  Weight:      Height:        Intake/Output Summary (Last 24 hours) at 06/12/2019 1403 Last data filed at 06/12/2019 0600 Gross per 24 hour  Intake 1400 ml  Output --  Net 1400 ml   Filed Weights   06/11/19 1609  Weight: 73.5 kg    Exam: General: Awake alert no acute distress CV: Regular rate and rhythm no murmur gallop or rub no lower extremity edema Respiratory: No increased work of breathing breath sounds clear bilaterally I hear no wheeze or rhonchi Abdomen: Obese soft positive bowel sounds throughout nontender to palpation no guarding  no rebounding Musculoskeletal: Joints without swelling/erythema full range of motion Neuro: Alert and oriented x3 speech clear facial symmetry, grip 5 out of 5 she is quite hard of hearing moves all extremities spontaneously  Data Reviewed:   I have personally reviewed following labs and imaging studies:  Labs: Labs show the following:   Basic Metabolic Panel: Recent Labs  Lab 06/11/19 1617 06/12/19 0711  NA 144 142  K 3.8 3.8  CL 110 114*  CO2 19* 19*  GLUCOSE 120* 85  BUN 25* 17  CREATININE 1.52* 1.12*  CALCIUM 9.4 9.0   GFR Estimated Creatinine Clearance: 35.4 mL/min (A) (by C-G formula based on SCr of 1.12 mg/dL (H)). Liver Function Tests: Recent Labs  Lab 06/11/19 2253  AST 18  ALT 15  ALKPHOS 65  BILITOT 0.9  PROT 5.9*  ALBUMIN 3.1*   Recent Labs  Lab 06/11/19 2253  LIPASE 32   No results for input(s): AMMONIA in the last 168 hours. Coagulation profile No results for input(s): INR, PROTIME in the last 168 hours.  CBC: Recent Labs  Lab 06/11/19 1617  WBC 12.0*  HGB 11.8*  HCT 38.6  MCV 87.5  PLT 237   Cardiac Enzymes: No results for input(s): CKTOTAL, CKMB, CKMBINDEX, TROPONINI in the last 168 hours. BNP (last 3 results) No results for input(s): PROBNP in the last 8760 hours. CBG: No results for input(s): GLUCAP in the last 168 hours. D-Dimer: No results for input(s): DDIMER in the last 72 hours. Hgb A1c: Recent Labs    06/12/19 0711  HGBA1C 5.7*   Lipid Profile: Recent Labs    06/12/19 0711  CHOL 140  HDL 47  LDLCALC 72  TRIG 105  CHOLHDL 3.0   Thyroid function studies: No results for input(s): TSH, T4TOTAL, T3FREE, THYROIDAB in the last 72 hours.  Invalid input(s): FREET3 Anemia work up: No results for input(s): VITAMINB12, FOLATE, FERRITIN, TIBC, IRON, RETICCTPCT in the last 72 hours. Sepsis Labs: Recent Labs  Lab 06/11/19 1617  WBC 12.0*    Microbiology Recent Results (from the past 240 hour(s))  SARS Coronavirus  2 by RT PCR (hospital order, performed in [facility] hospital lab) Nasopharyngeal Nasopharyngeal Swab     Status: None   Collection Time: 06/11/19  9:16 PM   Specimen: Nasopharyngeal Swab  Result Value Ref Range Status   SARS Coronavirus 2 NEGATIVE NEGATIVE Final    Comment: (NOTE) SARS-CoV-2 target nucleic acids are NOT DETECTED. The SARS-CoV-2 RNA is generally detectable in upper and lower respiratory specimens during the acute phase of infection. The lowest concentration of SARS-CoV-2 viral copies this assay can detect is 250 copies / mL. A negative result does not preclude SARS-CoV-2 infection and should not be used as the sole basis for treatment or other patient management decisions.  A negative result may occur with improper specimen collection / handling, submission  of specimen other than nasopharyngeal swab, presence of viral mutation(s) within the areas targeted by this assay, and inadequate number of viral copies (<250 copies / mL). A negative result must be combined with clinical observations, patient history, and epidemiological information. Fact Sheet for Patients:   BoilerBrush.com.cy Fact Sheet for Healthcare Providers: https://pope.com/ This test is not yet approved or cleared  by the [place] FDA and has been authorized for detection and/or diagnosis of SARS-CoV-2 by FDA under an Emergency Use Authorization (EUA).  This EUA will remain in effect (meaning this test can be used) for the duration of the COVID-19 declaration under Section 564(b)(1) of the Act, 21 U.S.C. section 360bbb-3(b)(1), unless the authorization is terminated or revoked sooner. Performed at [facility], [address], [place], [place] [address]     Procedures and diagnostic studies:  DG Chest 2 View  Result Date: 06/11/2019 CLINICAL DATA:  Dizziness. Feeling funny in her head 1 hour. Nausea, vomiting, dizziness. EXAM: CHEST - 2 VIEW  COMPARISON:  06/27/2010 FINDINGS: Heart size is normal. There is mild prominence of interstitial markings. There are no focal consolidations or pleural effusions. No edema. IMPRESSION: Mild prominence of interstitial markings.  No acute consolidation. Electronically Signed   By: Norva Pavlov M.D.   On: 06/11/2019 16:50   CT Head Wo Contrast  Result Date: 06/11/2019 CLINICAL DATA:  Headache, dizziness, nausea, vomiting EXAM: CT HEAD WITHOUT CONTRAST TECHNIQUE: Contiguous axial images were obtained from the base of the skull through the vertex without intravenous contrast. COMPARISON:  None. FINDINGS: Brain: No evidence of acute infarction, hemorrhage, hydrocephalus, extra-axial collection or mass lesion/mass effect. Periventricular white matter hypodensity. Vascular: No hyperdense vessel or unexpected calcification. Skull: Normal. Negative for fracture or focal lesion. Sinuses/Orbits: No acute finding. Other: None. IMPRESSION: No acute intracranial pathology.  Small-vessel white matter disease. Electronically Signed   By: Lauralyn Primes M.D.   On: 06/11/2019 17:07   MR BRAIN WO CONTRAST  Result Date: 06/11/2019 CLINICAL DATA:  Ataxia, stroke suspected. Additional history provided by scanning technologist: Patient felt "woozy" in her head for 1 hour with nausea and vomiting EXAM: MRI HEAD WITHOUT CONTRAST TECHNIQUE: Multiplanar, multiecho pulse sequences of the brain and surrounding structures were obtained without intravenous contrast. COMPARISON:  Noncontrast head CT performed earlier the same day 06/11/2019 FINDINGS: Brain: There is a punctate acute cortical infarct within the posterior right frontal lobe (series 5, image 92) (series 7, image 53). No acute infarct is identified elsewhere within the brain. Mild to moderate patchy T2/FLAIR hyperintensity within the cerebral white matter is nonspecific, but consistent with chronic small vessel ischemic disease. Chronic right cerebellar lacunar infarct  Mild-to-moderate generalized parenchymal atrophy. No evidence of intracranial mass. No chronic intracranial blood products. No extra-axial fluid collection. No midline shift. Vascular: Expected proximal arterial flow voids. Skull and upper cervical spine: No focal marrow lesion. Sinuses/Orbits: Visualized orbits show no acute finding. Minimal ethmoid sinus mucosal thickening. Trace bilateral mastoid effusions. IMPRESSION: Punctate acute cortical infarct within the posterior right frontal lobe. Mild-to-moderate generalized parenchymal atrophy and chronic small vessel ischemic disease. Chronic right cerebellar lacunar infarct. Minimal ethmoid sinus mucosal thickening. Trace bilateral mastoid effusions. Electronically Signed   By: Jackey Loge DO   On: 06/11/2019 20:08   ECHOCARDIOGRAM COMPLETE  Result Date: 06/12/2019    ECHOCARDIOGRAM REPORT   Patient Name:   Sharon Bailey Date of Exam: 06/12/2019 Medical Rec #:  [id number]      Height:  64.0 in Accession #:    [id number]     Weight:       162.0 lb Date of Birth:  1930-08-09      BSA:          1.789 m Patient Age:    88 years       BP:           170/71 mmHg Patient Gender: F              HR:           81 bpm. Exam Location:  Inpatient Procedure: 2D Echo, Cardiac Doppler and Color Doppler Indications:    Stroke  History:        Patient has prior history of Echocardiogram examinations, most                 recent 04/30/2016. Risk Factors:Hypertension, Dyslipidemia and                 Sleep Apnea.  Sonographer:    Ross LudwigArthur Guy RDCS (AE) Referring Phys: [id number] VASUNDHRA RATHORE IMPRESSIONS  1. Left ventricular ejection fraction, by estimation, is 65 to 70%. The left ventricle has normal function. The left ventricle has no regional wall motion abnormalities. There is severe left ventricular hypertrophy. Left ventricular diastolic parameters  are consistent with Grade I diastolic dysfunction (impaired relaxation). Elevated left ventricular end-diastolic pressure.  2.  Right ventricular systolic function is normal. The right ventricular size is normal. There is mildly elevated pulmonary artery systolic pressure.  3. The mitral valve is grossly normal. Trivial mitral valve regurgitation.  4. The aortic valve is tricuspid. Aortic valve regurgitation is trivial. Mild aortic valve stenosis. Aortic valve area, by VTI measures 1.67 cm. Aortic valve mean gradient measures 11.0 mmHg. Aortic valve Vmax measures 2.50 m/s.  5. The inferior vena cava is normal in size with <50% respiratory variability, suggesting right atrial pressure of 8 mmHg. FINDINGS  Left Ventricle: Left ventricular ejection fraction, by estimation, is 65 to 70%. The left ventricle has normal function. The left ventricle has no regional wall motion abnormalities. The left ventricular internal cavity size was normal in size. There is  severe left ventricular hypertrophy. Left ventricular diastolic parameters are consistent with Grade I diastolic dysfunction (impaired relaxation). Elevated left ventricular end-diastolic pressure. Right Ventricle: The right ventricular size is normal. No increase in right ventricular wall thickness. Right ventricular systolic function is normal. There is mildly elevated pulmonary artery systolic pressure. The tricuspid regurgitant velocity is 3.01  m/s, and with an assumed right atrial pressure of 8 mmHg, the estimated right ventricular systolic pressure is 44.2 mmHg. Left Atrium: Left atrial size was normal in size. Right Atrium: Right atrial size was normal in size. Pericardium: There is no evidence of pericardial effusion. Mitral Valve: The mitral valve is grossly normal. Trivial mitral valve regurgitation. MV peak gradient, 8.0 mmHg. The mean mitral valve gradient is 2.0 mmHg. Tricuspid Valve: The tricuspid valve is grossly normal. Tricuspid valve regurgitation is mild. Aortic Valve: The aortic valve is tricuspid. Aortic valve regurgitation is trivial. Mild aortic stenosis is present.  Aortic valve mean gradient measures 11.0 mmHg. Aortic valve peak gradient measures 25.0 mmHg. Aortic valve area, by VTI measures 1.67 cm. Pulmonic Valve: The pulmonic valve was grossly normal. Pulmonic valve regurgitation is trivial. Aorta: The aortic root and ascending aorta are structurally normal, with no evidence of dilitation. Venous: The inferior vena cava is normal in size with less than 50% respiratory variability, suggesting  right atrial pressure of 8 mmHg. IAS/Shunts: No atrial level shunt detected by color flow Doppler.  LEFT VENTRICLE PLAX 2D LVIDd:         3.45 cm  Diastology LVIDs:         2.22 cm  LV e' lateral:   5.55 cm/s LV PW:         1.54 cm  LV E/e' lateral: 15.1 LV IVS:        2.01 cm  LV e' medial:    4.24 cm/s LVOT diam:     1.90 cm  LV E/e' medial:  19.7 LV SV:         80 LV SV Index:   45 LVOT Area:     2.84 cm  RIGHT VENTRICLE             IVC RV S prime:     14.70 cm/s  IVC diam: 1.95 cm TAPSE (M-mode): 2.4 cm LEFT ATRIUM             Index       RIGHT ATRIUM           Index LA diam:        2.90 cm 1.62 cm/m  RA Area:     16.90 cm LA Vol (A2C):   53.9 ml 30.13 ml/m RA Volume:   51.20 ml  28.62 ml/m LA Vol (A4C):   36.6 ml 20.46 ml/m LA Biplane Vol: 46.6 ml 26.05 ml/m  AORTIC VALVE AV Area (Vmax):    1.61 cm AV Area (Vmean):   1.96 cm AV Area (VTI):     1.67 cm AV Vmax:           250.00 cm/s AV Vmean:          152.000 cm/s AV VTI:            0.481 m AV Peak Grad:      25.0 mmHg AV Mean Grad:      11.0 mmHg LVOT Vmax:         142.00 cm/s LVOT Vmean:        105.000 cm/s LVOT VTI:          0.283 m LVOT/AV VTI ratio: 0.59  AORTA Ao Root diam: 3.10 cm MITRAL VALVE                TRICUSPID VALVE MV Area (PHT): 3.16 cm     TR Peak grad:   36.2 mmHg MV Peak grad:  8.0 mmHg     TR Vmax:        301.00 cm/s MV Mean grad:  2.0 mmHg MV Vmax:       1.41 m/s     SHUNTS MV Vmean:      63.5 cm/s    Systemic VTI:  0.28 m MV Decel Time: 240 msec     Systemic Diam: 1.90 cm MV E velocity: 83.60 cm/s  MV A velocity: 126.00 cm/s MV E/A ratio:  0.66 Zoila Shutter MD Electronically signed by Zoila Shutter MD Signature Date/Time: 06/12/2019/10:27:20 AM    Final    VAS US CAROTID  Result Date: 06/12/2019 Carotid Arterial Duplex Study Indications:       CVA. Risk Factors:      Hypertension, hyperlipidemia. Comparison Study:  No prior study Performing Technologist: Gertie Fey MHA, RDMS, RVT, RDCS  Examination Guidelines: A complete evaluation includes B-mode imaging, spectral Doppler, color Doppler, and power Doppler as needed of all accessible portions of each vessel. Bilateral  testing is considered an integral part of a complete examination. Limited examinations for reoccurring indications may be performed as noted.  Right Carotid Findings: +----------+--------+-------+--------+--------------------------------+--------+           PSV cm/sEDV    StenosisPlaque Description              Comments                   cm/s                                                    +----------+--------+-------+--------+--------------------------------+--------+ CCA Prox  92      11                                                      +----------+--------+-------+--------+--------------------------------+--------+ CCA Distal89      15             smooth, heterogenous and                                                  calcific                                 +----------+--------+-------+--------+--------------------------------+--------+ ICA Prox  131     23             heterogenous, smooth and                                                  calcific                                 +----------+--------+-------+--------+--------------------------------+--------+ ICA Distal87      16                                                      +----------+--------+-------+--------+--------------------------------+--------+ ECA       188     11             smooth,  heterogenous and                                                  calcific                                 +----------+--------+-------+--------+--------------------------------+--------+ +----------+--------+-------+----------------+-------------------+           PSV cm/sEDV cmsDescribe        Arm  Pressure (mmHG) +----------+--------+-------+----------------+-------------------+ [id number]            Multiphasic, WNL                    +----------+--------+-------+----------------+-------------------+ +---------+--------+--+--------+--+---------+ VertebralPSV cm/s54EDV cm/s10Antegrade +---------+--------+--+--------+--+---------+  Left Carotid Findings: +----------+--------+-------+--------+--------------------------------+--------+           PSV cm/sEDV    StenosisPlaque Description              Comments                   cm/s                                                    +----------+--------+-------+--------+--------------------------------+--------+ CCA Prox  120     23             smooth and heterogenous                  +----------+--------+-------+--------+--------------------------------+--------+ CCA Distal101     14             smooth, heterogenous and                                                  calcific                                 +----------+--------+-------+--------+--------------------------------+--------+ ICA Prox  103     23             heterogenous and calcific                +----------+--------+-------+--------+--------------------------------+--------+ ICA Distal115     21                                                      +----------+--------+-------+--------+--------------------------------+--------+ ECA       173     9              smooth, heterogenous and                                                  calcific                                  +----------+--------+-------+--------+--------------------------------+--------+ +----------+--------+--------+----------------+-------------------+           PSV cm/sEDV cm/sDescribe        Arm Pressure (mmHG) +----------+--------+--------+----------------+-------------------+ [id number]             Multiphasic, WNL                    +----------+--------+--------+----------------+-------------------+ +---------+--------+--+--------+-+---------+ VertebralPSV cm/s36EDV cm/s6Antegrade +---------+--------+--+--------+-+---------+   Summary: Right Carotid: Velocities in the right ICA are consistent with a 1-39% stenosis. Left Carotid: Velocities in the left ICA are consistent  with a 1-39% stenosis. Vertebrals:  Bilateral vertebral arteries demonstrate antegrade flow. Subclavians: Normal flow hemodynamics were seen in bilateral subclavian              arteries. *See table(s) above for measurements and observations.  Electronically signed by Delia Heady MD on 06/12/2019 at 12:34:21 PM.    Final    VAS [place] TRANSCRANIAL DOPPLER  Result Date: 06/12/2019  Transcranial Doppler Indications: Stroke. History: Hypertension. Hyperlipidemia. Limitations: Poor acoustic windows Limitations for diagnostic windows: Unable to insonate right transtemporal window. Comparison Study: No prior study Performing Technologist: Gertie Fey MHA, RDMS, RVT, RDCS  Examination Guidelines: A complete evaluation includes B-mode imaging, spectral Doppler, color Doppler, and power Doppler as needed of all accessible portions of each vessel. Bilateral testing is considered an integral part of a complete examination. Limited examinations for reoccurring indications may be performed as noted.  +----------+-------------+----------+-----------+------------------+ RIGHT TCD Right VM (cm)Depth (cm)Pulsatility     Comment       +----------+-------------+----------+-----------+------------------+ MCA                                          Unable to insonate +----------+-------------+----------+-----------+------------------+ ACA                                         Unable to insonate +----------+-------------+----------+-----------+------------------+ Term ICA                                    Unable to insonate +----------+-------------+----------+-----------+------------------+ PCA                                         Unable to insonate +----------+-------------+----------+-----------+------------------+ Opthalmic     22.00                 1.89                       +----------+-------------+----------+-----------+------------------+ ICA siphon    26.00                 1.74                       +----------+-------------+----------+-----------+------------------+ Vertebral    -25.00                 1.55                       +----------+-------------+----------+-----------+------------------+  +----------+------------+----------+-----------+------------------+ LEFT TCD  Left VM (cm)Depth (cm)Pulsatility     Comment       +----------+------------+----------+-----------+------------------+ MCA                                        Unable to insonate +----------+------------+----------+-----------+------------------+ ACA                                        Unable to insonate +----------+------------+----------+-----------+------------------+ Term ICA  Unable to insonate +----------+------------+----------+-----------+------------------+ PCA          37.00                 1.57                       +----------+------------+----------+-----------+------------------+ Opthalmic    25.00                 1.55                       +----------+------------+----------+-----------+------------------+ ICA siphon   27.00                 1.70                        +----------+------------+----------+-----------+------------------+ Vertebral    -15.00                1.27                       +----------+------------+----------+-----------+------------------+  +------------+-------+-------+             VM cm/sComment +------------+-------+-------+ Dist Basilar-25.00         +------------+-------+-------+ Summary:  Absent bitemporal and poor occipital windows limit exam. Antegrade flow in both opthalmics, carotid siphons, vertebrals and basilar arteries. *See table(s) above for TCD measurements and observations.  Diagnosing physician: Antony Contras MD Electronically signed by Antony Contras MD on 06/12/2019 at 12:35:44 PM.    Final     Medications:   .  stroke: mapping our early stages of recovery book   Does not apply Once  . allopurinol  300 mg Oral Daily  . aspirin  300 mg Rectal Daily   Or  . aspirin  325 mg Oral Daily  . budesonide  3 mg Oral Daily  . enoxaparin (LOVENOX) injection  40 mg Subcutaneous Q24H  . levothyroxine  88 mcg Oral QAC breakfast  . simvastatin  40 mg Oral q1800  . sodium chloride flush  3 mL Intravenous Once   Continuous Infusions:   LOS: 1 day   Radene Gunning NP [facility]   How to contact the [facility] Attending or Consulting provider [place] or covering provider during after hours [place], for this patient?  1. Check the care team in [facility] and look for a) attending/consulting TRH provider listed and b) the [facility] team listed 2. Log into [email or web address] and use [place]'s universal password to access. If you do not have the password, please contact the hospital operator. 3. Locate the [facility] provider you are looking for under [facility] and page to a number that you can be directly reached. 4. If you still have difficulty reaching the provider, please page the [facility] (Director on Call) for the Hospitalists listed on amion for assistance.  06/12/2019, 2:03 PM

## 2019-06-12 NOTE — ED Notes (Signed)
Tele  Breakfast Ordered 

## 2019-06-12 NOTE — Evaluation (Signed)
Speech Language Pathology Evaluation Patient Details Name: HERMINA BARNARD MRN: [id number] DOB: 1930/11/29 Today's Date: 06/12/2019 Time: 8466-5993 SLP Time Calculation (min) (ACUTE ONLY): 14 min  Problem List:  Patient Active Problem List   Diagnosis Date Noted  . Acute CVA (cerebrovascular accident) (HCC) 06/11/2019  . Nausea and vomiting 06/11/2019  . AKI (acute kidney injury) (HCC) 06/11/2019  . Metabolic acidosis 06/11/2019  . 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 Obstructive sleep apnea 10/31/2018  . Sensorineural hearing loss (SNHL) of both ears 10/31/2018  . Low back pain 09/21/2017  . Chronic fatigue 02/05/2017  . Atypical chest pain 02/05/2017  . Hyperlipidemia 02/05/2017  . Palpitations 04/18/2016  . HTN (hypertension) 04/18/2016  . Hypertensive heart disease without heart failure 04/18/2016  . Status post foot surgery 10/11/2012  . Gout attack 09/28/2012  . Hammer toe of left foot 09/28/2012  . Pain in joint, ankle and foot 09/28/2012   Past Medical History:  Past Medical History:  Diagnosis Date  . Abnormal EKG   . Collagenous colitis   . Collagenous colitis   . Diverticulitis   . Diverticulosis   . Fluttering heart   . GERD (gastroesophageal reflux disease)   . Gout   . Gout attack 09/28/2012  . Hammer toe of left foot 09/28/2012  . Hyperlipidemia   . Hypertension   . Hypothyroidism   . Irregular heart beat   . Pain in joint, ankle and foot 09/28/2012  . Status post foot surgery 10/11/2012  . Thyroid disease    Past Surgical History:  Past Surgical History:  Procedure Laterality Date  . ABDOMINAL HYSTERECTOMY    . ADENOIDECTOMY    . CHOLECYSTECTOMY    . Excision Ganglion Toe Left 09/29/2012   Lt #2 @ [facility]  . Hammer toe repair Left 09/29/2012   Lt #2 @ [facility]  . TONSILLECTOMY     HPI:      Assessment / Plan / Recommendation Clinical Impression  Cognitive-linguistic evaluation complete. Communication mildly impacted by [facility] (hearing aids  not available), however patient able to answer all questions accurately with louder volume. Daughter confirms that patient is at baseline. No further SLP needs indicated.     SLP Assessment  SLP Recommendation/Assessment: Patient does not need any further Speech Lanaguage Pathology Services SLP Visit Diagnosis: Cognitive communication deficit (R41.841)    Follow Up Recommendations  None          SLP Evaluation Cognition  Overall Cognitive Status: Within Functional Limits for tasks assessed       Comprehension  Auditory Comprehension Overall Auditory Comprehension: Appears within functional limits for tasks assessed Visual Recognition/Discrimination Discrimination: Within Function Limits Reading Comprehension Reading Status: Within funtional limits    Expression Expression Primary Mode of Expression: Verbal Verbal Expression Overall Verbal Expression: Appears within functional limits for tasks assessed   Oral / Motor  Oral Motor/Sensory Function Overall Oral Motor/Sensory Function: Within functional limits Motor Speech Overall Motor Speech: Appears within functional limits for tasks assessed   GO            Ferdinand Lango MA, CCC-SLP           Brecklyn Galvis Meryl 06/12/2019, 12:17 PM

## 2019-06-12 NOTE — Progress Notes (Signed)
Carotid artery duplex TCD completed. Refer to "CV Proc" under chart review to view preliminary results.  06/12/2019 10:40 AM Eula Fried., MHA, RVT, RDCS, RDMS

## 2019-06-12 NOTE — Telephone Encounter (Signed)
Patient's daughter called  The patient was admitted to [facility] and has been there. They wanted to know if its possible for her to go ahead and have her MRI done there.   Call back: [phone]

## 2019-06-12 NOTE — Telephone Encounter (Signed)
Can you advise on this one? This has been approved by [facility], but not sure if that's only for [facility].

## 2019-06-12 NOTE — TOC Initial Note (Signed)
Transition of Care [facility]) - Initial/Assessment Note    Patient Details  Name: Sharon Bailey MRN: [id number] Date of Birth: 01/11/1930  [facility]) CM/SW Contact:    Kingsley Plan, RN Phone Number: 06/12/2019, 3:22 PM  Clinical Narrative:                  Spoke to patient and daughter at bedside. Discussed OP PT , both in agreement , referral made to Neuro rehab on [address] Expected Discharge Plan: Home/Self Care Barriers to Discharge: Continued Medical Work up   Patient Goals and CMS Choice Patient states their goals for this hospitalization and ongoing recovery are:: to return to home CMS Medicare.gov Compare Post Acute Care list provided to:: Patient Choice offered to / list presented to : Patient, Adult Children  Expected Discharge Plan and Services Expected Discharge Plan: Home/Self Care   Discharge Planning Services: CM Consult Post Acute Care Choice: (OP PT) Living arrangements for the past 2 months: Single Family Home                 DME Arranged: N/A DME Agency: NA       HH Arranged: NA          Prior Living Arrangements/Services Living arrangements for the past 2 months: Single Family Home Lives with:: Self Patient language and need for interpreter reviewed:: Yes Do you feel safe going back to the place where you live?: Yes      Need for Family Participation in Patient Care: Yes (Comment) Care giver support system in place?: Yes (comment) Current home services: DME Criminal Activity/Legal Involvement Pertinent to Current Situation/Hospitalization: No - Comment as needed  Activities of Daily Living      Permission Sought/Granted   Permission granted to share information with : Yes, Verbal Permission Granted  Share Information with NAME: Sharalyn Ink daughter           Emotional Assessment Appearance:: Appears stated age Attitude/Demeanor/Rapport: Engaged Affect (typically observed): Accepting Orientation: : Oriented to  Self, Oriented to Place, Oriented to  Time, Oriented to Situation Alcohol / Substance Use: Not Applicable Psych Involvement: No (comment)  Admission diagnosis:  Dizziness [R42] Acute CVA (cerebrovascular accident) (HCC) [I63.9] Cerebrovascular accident (CVA), unspecified mechanism (HCC) [I63.9] Patient Active Problem List   Diagnosis Date Noted  . Hypertensive emergency 06/12/2019  . Acute CVA (cerebrovascular accident) (HCC) 06/11/2019  . Nausea and vomiting 06/11/2019  . AKI (acute kidney injury) (HCC) 06/11/2019  . Metabolic acidosis 06/11/2019  . 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 Obstructive sleep apnea 10/31/2018  . Sensorineural hearing loss (SNHL) of both ears 10/31/2018  . Low back pain 09/21/2017  . Chronic fatigue 02/05/2017  . Atypical chest pain 02/05/2017  . Hyperlipidemia 02/05/2017  . Palpitations 04/18/2016  . HTN (hypertension) 04/18/2016  . Hypertensive heart disease without heart failure 04/18/2016  . Status post foot surgery 10/11/2012  . Gout attack 09/28/2012  . Hammer toe of left foot 09/28/2012  . Pain in joint, ankle and foot 09/28/2012   PCP:  Clovis Riley, L.August Saucer, MD Pharmacy:   [facility] [phone] Gin[place] Otto, [place] - [address] AT [facility] [address] [place] [address] Phone: [phone] Fax: [phone]  [facility] [place], [place] - [address] [facility] [address] [place] [address] [place] [address] Phone: [phone] Fax: [phone]     Social Determinants of Health (SDOH) Interventions    Readmission Risk  Interventions No flowsheet data found.

## 2019-06-12 NOTE — Telephone Encounter (Signed)
Sw pt daughter and advised that I did not think it would be a problem to get her scheduled and done at [facility], I will have to contact her insurance co and let them know to change facility and to let the hospital imaging know she is inpatient.

## 2019-06-12 NOTE — Evaluation (Signed)
Physical Therapy Evaluation Patient Details Name: Sharon Bailey MRN: [id number] DOB: 22-Feb-1930 Today's Date: 06/12/2019   History of Present Illness  Sharon Bailey is a 86 y.o. female with medical history significant of bilateral sensorineural hearing loss, hypertension, hyperlipidemia, hypothyroidism, gout, GERD presenting with complaints of lightheadedness, nausea, and vomiting.  Patient states she was feeling well during the day then all of a sudden during the afternoon started feeling lightheaded and was staggering when she tried to walk to the bathroom; Brain MRI showing a punctate acute cortical infarct within the posterior right frontal lobe.  Clinical Impression   Pt admitted with above diagnosis. Comes from home where she lives alone in a single level house with 2-3 steps to enter; Typically does not use an assistive device for walking, still drives; indicated she cannot stand very long and takes frequent breaks to sit due to low back pain; Present to PT with gait and balance dysfunction, and decr activity tolerance; Highly recommend she use a RW for steadiness, and to help with standing tolerance and to decr the work of walking; Pt currently with functional limitations due to the deficits listed below (see PT Problem List). Pt will benefit from skilled PT to increase their independence and safety with mobility to allow discharge to the venue listed below.       Follow Up Recommendations Outpatient PT;Other (comment)(for gait and balance training and low back pain)    Equipment Recommendations  None recommended by PT(already has RW)    Recommendations for Other Services       Precautions / Restrictions Precautions Precautions: Fall Precaution Comments: fall risk greatly reduced with use of RW      Mobility  Bed Mobility                  Transfers Overall transfer level: Needs assistance Equipment used: 1 person hand held assist Transfers: Sit to/from Stand Sit to  Stand: Min guard         General transfer comment: minguard for safety; noted dependent on UE push  Ambulation/Gait Ambulation/Gait assistance: Min assist;Min guard Gait Distance (Feet): 180 Feet Assistive device: 1 person hand held assist;Rolling walker (2 wheeled) Gait Pattern/deviations: Step-through pattern;Decreased step length - right;Decreased step length - left;Decreased stride length;Wide base of support Gait velocity: slowed   General Gait Details: Tendign to reach out for UE support; with short trial of amb witout UE suport, noted incr step width, shorter step length; steps and gait pattern smoothed out with bil UE support from [name] Mobility    Modified Rankin (Stroke Patients Only) Modified Rankin (Stroke Patients Only) Pre-Morbid Rankin Score: No symptoms Modified Rankin: Slight disability     Balance Overall balance assessment: Needs assistance             Standing balance comment: Tends to reach out for UE support while standing                             Pertinent Vitals/Pain Pain Assessment: No/denies pain    Home Living Family/patient expects to be discharged to:: Private residence Living Arrangements: Alone Available Help at Discharge: Family;Friend(s) Type of Home: House Home Access: Stairs to enter Entrance Stairs-Rails: Doctor, general practice of Steps: 2 Home Layout: One level Home Equipment: Walker - 2 wheels;Shower seat;Cane - single point      Prior Function Level of  Independence: Independent         Comments: Still drives, manages her home, IADLs independently; Report chorinic back pain keep her from stanidng for long, and that she takes very frequent seated breaks     Hand Dominance        Extremity/Trunk Assessment   Upper Extremity Assessment Upper Extremity Assessment: Defer to OT evaluation    Lower Extremity Assessment Lower Extremity Assessment:  Generalized weakness    Cervical / Trunk Assessment Cervical / Trunk Assessment: Other exceptions Cervical / Trunk Exceptions: chronic low back pain  Communication   Communication: HOH  Cognition Arousal/Alertness: Awake/alert Behavior During Therapy: WFL for tasks assessed/performed Overall Cognitive Status: Within Functional Limits for tasks assessed                                        General Comments General comments (skin integrity, edema, etc.): Educated pt on BE FAST signs and symptoms of CVA    Exercises     Assessment/Plan    PT Assessment Patient needs continued PT services  PT Problem List Decreased strength;Decreased activity tolerance;Decreased balance;Decreased mobility;Decreased coordination;Decreased knowledge of use of DME;Decreased safety awareness       PT Treatment Interventions DME instruction;Gait training;Stair training;Functional mobility training;Therapeutic activities;Therapeutic exercise;Balance training;Neuromuscular re-education;Patient/family education    PT Goals (Current goals can be found in the Care Plan section)  Acute Rehab PT Goals Patient Stated Goal: Hopes to go home soon PT Goal Formulation: With patient Time For Goal Achievement: 06/26/19 Potential to Achieve Goals: Good    Frequency Min 3X/week   Barriers to discharge        Co-evaluation               AM-PAC PT "6 Clicks" Mobility  Outcome Measure Help needed turning from your back to your side while in a flat bed without using bedrails?: None Help needed moving from lying on your back to sitting on the side of a flat bed without using bedrails?: A Little Help needed moving to and from a bed to a chair (including a wheelchair)?: A Little Help needed standing up from a chair using your arms (e.g., wheelchair or bedside chair)?: A Little Help needed to walk in hospital room?: A Little Help needed climbing 3-5 steps with a railing? : A Little 6 Click  Score: 19    End of Session Equipment Utilized During Treatment: Gait belt Activity Tolerance: Patient tolerated treatment well Patient left: in chair;with call bell/phone within reach;with chair alarm set Nurse Communication: Mobility status PT Visit Diagnosis: Unsteadiness on feet (R26.81);Other abnormalities of gait and mobility (R26.89)    Time: 3875-6433 PT Time Calculation (min) (ACUTE ONLY): 32 min   Charges:   PT Evaluation $PT Eval Low Complexity: 1 Low PT Treatments $Gait Training: 8-22 mins        Roney Marion, PT  Acute Rehabilitation Services Pager [phone] Office [place] 06/12/2019, 1:47 PM

## 2019-06-13 LAB — VITAMIN B12: Vitamin B-12: 132 pg/mL — ABNORMAL LOW (ref 180–914)

## 2019-06-13 LAB — PROTIME-INR
INR: 1.1 (ref 0.8–1.2)
Prothrombin Time: 13.5 seconds (ref 11.4–15.2)

## 2019-06-13 MED ORDER — ASPIRIN 81 MG PO TBEC: 81.0000 mg | DELAYED_RELEASE_TABLET | Freq: Every day | ORAL | Status: DC

## 2019-06-13 MED ORDER — VITAMIN B-12 1000 MCG PO TABS
1000.0000 ug | ORAL_TABLET | Freq: Every day | ORAL | Status: DC
  Administered 2019-06-13: 1000 ug via ORAL
  Filled 2019-06-13 (×2): qty 1

## 2019-06-13 MED ORDER — LISINOPRIL 40 MG PO TABS: 40.0000 mg | ORAL_TABLET | Freq: Every day | ORAL | Status: DC

## 2019-06-13 MED ORDER — CYANOCOBALAMIN 1000 MCG PO TABS: 1000.0000 ug | ORAL_TABLET | Freq: Every day | ORAL | Status: AC

## 2019-06-13 MED ORDER — AMLODIPINE BESYLATE 5 MG PO TABS: 5.0000 mg | ORAL_TABLET | Freq: Every day | ORAL | 0 refills | Status: DC

## 2019-06-13 MED ORDER — ASCORBIC ACID 1000 MG PO TABS: 1000.0000 mg | ORAL_TABLET | Freq: Every day | ORAL | Status: DC

## 2019-06-13 NOTE — Progress Notes (Signed)
Occupational Therapy Evaluation Patient Details Name: Sharon Bailey MRN: [id number] DOB: June 28, 1930 Today's Date: 06/13/2019    History of Present Illness Sharon Bailey is a 86 y.o. female with medical history significant of bilateral sensorineural hearing loss, hypertension, hyperlipidemia, hypothyroidism, gout, GERD presenting with complaints of lightheadedness, nausea, and vomiting.  Patient states she was feeling well during the day then all of a sudden during the afternoon started feeling lightheaded and was staggering when she tried to walk to the bathroom; Brain MRI showing a punctate acute cortical infarct within the posterior right frontal lobe.   Clinical Impression   Patient lives alone in a house and has daughters nearby.  She is independent at baseline and does not use assistive devices.Patient eager to leave today.  She was able to stand and walk with min guard and no RW.  With RW patient much more steady, discusses benefits of using one at home.  Completed LB ADLs with min guard and UB with supervision.  Educated on importance of taking BP medicine and checking BP regularly.  Will continue to follow with OT acutely to address the deficits listed below.      Follow Up Recommendations  No OT follow up;Supervision - Intermittent    Equipment Recommendations  None recommended by OT    Recommendations for Other Services       Precautions / Restrictions Precautions Precautions: Fall Precaution Comments: fall risk greatly reduced with use of RW Restrictions Weight Bearing Restrictions: No      Mobility Bed Mobility                  Transfers Overall transfer level: Needs assistance Equipment used: None Transfers: Sit to/from Stand Sit to Stand: Min guard              Balance Overall balance assessment: Needs assistance Sitting-balance support: No upper extremity supported;Feet supported Sitting balance-Leahy Scale: Good     Standing balance support:  No upper extremity supported Standing balance-Leahy Scale: Fair Standing balance comment: benefits from RW use                           ADL either performed or assessed with clinical judgement   ADL Overall ADL's : Needs assistance/impaired Eating/Feeding: Independent;Sitting   Grooming: Supervision/safety;Standing   Upper Body Bathing: Supervision/ safety;Sitting   Lower Body Bathing: Sit to/from stand;Min guard   Upper Body Dressing : Supervision/safety;Sitting   Lower Body Dressing: Min guard;Sit to/from stand   Toilet Transfer: Min guard;Ambulation;Regular Toilet           Functional mobility during ADLs: Min guard General ADL Comments: Patient would benefit from RW use     Vision Baseline Vision/History: No visual deficits Patient Visual Report: No change from baseline       Perception     Praxis      Pertinent Vitals/Pain Pain Assessment: No/denies pain     Hand Dominance Right   Extremity/Trunk Assessment Upper Extremity Assessment Upper Extremity Assessment: Overall WFL for tasks assessed           Communication Communication Communication: HOH   Cognition Arousal/Alertness: Awake/alert Behavior During Therapy: WFL for tasks assessed/performed Overall Cognitive Status: Within Functional Limits for tasks assessed                                     General Comments  Exercises     Shoulder Instructions      Home Living Family/patient expects to be discharged to:: Private residence Living Arrangements: Alone Available Help at Discharge: Family;Friend(s) Type of Home: House Home Access: Stairs to enter [employer] of Steps: 2 Entrance Stairs-Rails: Right;Left Home Layout: One level     Bathroom Shower/Tub: Walk-in shower         Home Equipment: Environmental consultant - 2 wheels;Shower seat;Cane - single point      Lives With: Alone    Prior Functioning/Environment Level of Independence:  Independent        Comments: Still drives, manages her home, IADLs independently; Report chorinic back pain keep her from stanidng for long, and that she takes very frequent seated breaks        OT Problem List: Decreased activity tolerance;Impaired balance (sitting and/or standing);Decreased safety awareness      OT Treatment/Interventions: Self-care/ADL training;DME and/or AE instruction;Balance training;Patient/family education    OT Goals(Current goals can be found in the care plan section) Acute Rehab OT Goals Patient Stated Goal: Hopes to go home soon OT Goal Formulation: With patient Time For Goal Achievement: 06/27/19 Potential to Achieve Goals: Good  OT Frequency: Min 2X/week   Barriers to D/C:            Co-evaluation              AM-PAC OT "6 Clicks" Daily Activity     Outcome Measure Help from another person eating meals?: None Help from another person taking care of personal grooming?: A Little Help from another person toileting, which includes using toliet, bedpan, or urinal?: A Little Help from another person bathing (including washing, rinsing, drying)?: A Little Help from another person to put on and taking off regular upper body clothing?: A Little Help from another person to put on and taking off regular lower body clothing?: A Little 6 Click Score: 19   End of Session Nurse Communication: Mobility status  Activity Tolerance: Patient tolerated treatment well Patient left: in chair;with call bell/phone within reach;with family/visitor present  OT Visit Diagnosis: Unsteadiness on feet (R26.81)                Time: 1031-1040 OT Time Calculation (min): 9 min Charges:  OT General Charges $OT Visit: 1 Visit OT Evaluation $OT Eval Moderate Complexity: 1 [address] OTR/L   Adella Hare 06/13/2019, 12:08 PM

## 2019-06-13 NOTE — Discharge Summary (Addendum)
Physician Discharge Summary  Sharon Bailey ZOX:[id number] DOB: 01-16-1930 DOA: 06/11/2019  PCP: Clovis Riley, L.August Saucer, MD  Admit date: 06/11/2019 Discharge date: 06/13/2019  Admitted From: home Discharge disposition: home   Recommendations for Outpatient Follow-Up:   Replaced B12 and started vit C for easy bruising-- follow for improvement--has been eating tea/toast/hotdog diet  Referral placed for 30 day event monitor Outpatient PT  Discharge Diagnosis:   Principal Problem:   Acute CVA (cerebrovascular accident) (HCC) Active Problems:   HTN (hypertension)   Hyperlipidemia   Sensorineural hearing loss (SNHL) of both ears   Nausea and vomiting   AKI (acute kidney injury) (HCC)   Metabolic acidosis   Hypertensive emergency    Discharge Condition: Improved.  Diet recommendation: Low sodium, heart healthy.    Wound care: None.  Code status: Full.   History of Present Illness:   Sharon Bailey is a 86 y.o. female with medical history significant of bilateral sensorineural hearing loss, hypertension, hyperlipidemia, hypothyroidism, gout, GERD presenting with complaints of lightheadedness, nausea, and vomiting.  Patient states she was feeling well during the day then all of a sudden during the afternoon started feeling lightheaded and was staggering when she tried to walk to the bathroom.  Soon after she felt nauseous and started vomiting.  She had another episode of vomiting at home later.  She did not have any focal weakness or numbness.  Per daughter at bedside she did not have any slurring of speech or drooping of her face.  Patient denies prior history of stroke.  Denies any recent illness, fevers, chills, cough, shortness of breath, chest pain, abdominal pain, diarrhea, or dysuria.   Hospital Course by Problem:   Acute CVA.   -per neuro:   MRI shows likely incidental asymptomatic tiny right frontal infarct etiology small vessel disease versus embolic.  Recommend aspirin  81 mg daily alone and continue cardiac monitoring while in the hospital and recommend outpatient 30-day heart monitor for paroxysmal A. fib.  Aggressive risk factor modification.    Acute kidney injuries imposed on stage IIIb chronic kidney.  Trending down. 1.12 close to baseline.  BUN within the limits of normal -Outpatient follow-up  Mild normal anion gap metabolic acidosis: Likely related to AKI. Bicarb 19, anion gap 9 -she received  Received IV fluid hydration with some improvement this am. Taking pos better   Hypertensive emergency.   -resume home meds -encouraged low salt diet (drinking gatorades etc)  Hyperlipidemia.  See #1. -Continue Zocor   Hypothyroidism  -continue home med     Medical Consultants:    neuro  Discharge Exam:   Vitals:   06/13/19 0814 06/13/19 0944  BP: (!) 147/89   Pulse: 72 76  Resp: 17   Temp: 98.4 F (36.9 C)   SpO2: 100%    Vitals:   06/12/19 1947 06/12/19 2314 06/13/19 0814 06/13/19 0944  BP: (!) 147/66  (!) 147/89   Pulse: 75 75 72 76  Resp: Temp: 98.4 F (36.9 C) 98.6 F (37 C) 98.4 F (36.9 C)   TempSrc: Oral Oral Oral   SpO2: 100%  100%   Weight:      Height:        General exam: Appears calm and comfortable.    The results of significant diagnostics from this hospitalization (including imaging, microbiology, ancillary and laboratory) are listed below for reference.     Procedures and Diagnostic Studies:   DG Chest 2 View  Result Date: 06/11/2019 CLINICAL DATA:  Dizziness. Feeling funny in her head 1 hour. Nausea, vomiting, dizziness. EXAM: CHEST - 2 VIEW COMPARISON:  06/27/2010 FINDINGS: Heart size is normal. There is mild prominence of interstitial markings. There are no focal consolidations or pleural effusions. No edema. IMPRESSION: Mild prominence of interstitial markings.  No acute consolidation. Electronically Signed   By: Norva Pavlov M.D.   On: 06/11/2019 16:50   CT Head Wo  Contrast  Result Date: 06/11/2019 CLINICAL DATA:  Headache, dizziness, nausea, vomiting EXAM: CT HEAD WITHOUT CONTRAST TECHNIQUE: Contiguous axial images were obtained from the base of the skull through the vertex without intravenous contrast. COMPARISON:  None. FINDINGS: Brain: No evidence of acute infarction, hemorrhage, hydrocephalus, extra-axial collection or mass lesion/mass effect. Periventricular white matter hypodensity. Vascular: No hyperdense vessel or unexpected calcification. Skull: Normal. Negative for fracture or focal lesion. Sinuses/Orbits: No acute finding. Other: None. IMPRESSION: No acute intracranial pathology.  Small-vessel white matter disease. Electronically Signed   By: Lauralyn Primes M.D.   On: 06/11/2019 17:07   MR BRAIN WO CONTRAST  Result Date: 06/11/2019 CLINICAL DATA:  Ataxia, stroke suspected. Additional history provided by scanning technologist: Patient felt "woozy" in her head for 1 hour with nausea and vomiting EXAM: MRI HEAD WITHOUT CONTRAST TECHNIQUE: Multiplanar, multiecho pulse sequences of the brain and surrounding structures were obtained without intravenous contrast. COMPARISON:  Noncontrast head CT performed earlier the same day 06/11/2019 FINDINGS: Brain: There is a punctate acute cortical infarct within the posterior right frontal lobe (series 5, image 92) (series 7, image 53). No acute infarct is identified elsewhere within the brain. Mild to moderate patchy T2/FLAIR hyperintensity within the cerebral white matter is nonspecific, but consistent with chronic small vessel ischemic disease. Chronic right cerebellar lacunar infarct Mild-to-moderate generalized parenchymal atrophy. No evidence of intracranial mass. No chronic intracranial blood products. No extra-axial fluid collection. No midline shift. Vascular: Expected proximal arterial flow voids. Skull and upper cervical spine: No focal marrow lesion. Sinuses/Orbits: Visualized orbits show no acute finding. Minimal  ethmoid sinus mucosal thickening. Trace bilateral mastoid effusions. IMPRESSION: Punctate acute cortical infarct within the posterior right frontal lobe. Mild-to-moderate generalized parenchymal atrophy and chronic small vessel ischemic disease. Chronic right cerebellar lacunar infarct. Minimal ethmoid sinus mucosal thickening. Trace bilateral mastoid effusions. Electronically Signed   By: Jackey Loge DO   On: 06/11/2019 20:08   ECHOCARDIOGRAM COMPLETE  Result Date: 06/12/2019    ECHOCARDIOGRAM REPORT   Patient Name:   Sharon Bailey Date of Exam: 06/12/2019 Medical Rec #:  [id number]      Height:       64.0 in Accession #:    [id number]     Weight:       162.0 lb Date of Birth:  05-14-1930      BSA:          1.789 m Patient Age:    88 years       BP:           170/71 mmHg Patient Gender: F              HR:           81 bpm. Exam Location:  Inpatient Procedure: 2D Echo, Cardiac Doppler and Color Doppler Indications:    Stroke  History:        Patient has prior history of Echocardiogram examinations, most                 recent 04/30/2016. Risk  Factors:Hypertension, Dyslipidemia and                 Sleep Apnea.  Sonographer:    Clayton Lefort RDCS (AE) Referring Phys: [id number] [place]  1. Left ventricular ejection fraction, by estimation, is 65 to 70%. The left ventricle has normal function. The left ventricle has no regional wall motion abnormalities. There is severe left ventricular hypertrophy. Left ventricular diastolic parameters  are consistent with Grade I diastolic dysfunction (impaired relaxation). Elevated left ventricular end-diastolic pressure.  2. Right ventricular systolic function is normal. The right ventricular size is normal. There is mildly elevated pulmonary artery systolic pressure.  3. The mitral valve is grossly normal. Trivial mitral valve regurgitation.  4. The aortic valve is tricuspid. Aortic valve regurgitation is trivial. Mild aortic valve stenosis. Aortic valve area, by  VTI measures 1.67 cm. Aortic valve mean gradient measures 11.0 mmHg. Aortic valve Vmax measures 2.50 m/s.  5. The inferior vena cava is normal in size with <50% respiratory variability, suggesting right atrial pressure of 8 mmHg. FINDINGS  Left Ventricle: Left ventricular ejection fraction, by estimation, is 65 to 70%. The left ventricle has normal function. The left ventricle has no regional wall motion abnormalities. The left ventricular internal cavity size was normal in size. There is  severe left ventricular hypertrophy. Left ventricular diastolic parameters are consistent with Grade I diastolic dysfunction (impaired relaxation). Elevated left ventricular end-diastolic pressure. Right Ventricle: The right ventricular size is normal. No increase in right ventricular wall thickness. Right ventricular systolic function is normal. There is mildly elevated pulmonary artery systolic pressure. The tricuspid regurgitant velocity is 3.01  m/s, and with an assumed right atrial pressure of 8 mmHg, the estimated right ventricular systolic pressure is 29.4 mmHg. Left Atrium: Left atrial size was normal in size. Right Atrium: Right atrial size was normal in size. Pericardium: There is no evidence of pericardial effusion. Mitral Valve: The mitral valve is grossly normal. Trivial mitral valve regurgitation. MV peak gradient, 8.0 mmHg. The mean mitral valve gradient is 2.0 mmHg. Tricuspid Valve: The tricuspid valve is grossly normal. Tricuspid valve regurgitation is mild. Aortic Valve: The aortic valve is tricuspid. Aortic valve regurgitation is trivial. Mild aortic stenosis is present. Aortic valve mean gradient measures 11.0 mmHg. Aortic valve peak gradient measures 25.0 mmHg. Aortic valve area, by VTI measures 1.67 cm. Pulmonic Valve: The pulmonic valve was grossly normal. Pulmonic valve regurgitation is trivial. Aorta: The aortic root and ascending aorta are structurally normal, with no evidence of dilitation. Venous: The  inferior vena cava is normal in size with less than 50% respiratory variability, suggesting right atrial pressure of 8 mmHg. IAS/Shunts: No atrial level shunt detected by color flow Doppler.  LEFT VENTRICLE PLAX 2D LVIDd:         3.45 cm  Diastology LVIDs:         2.22 cm  LV e' lateral:   5.55 cm/s LV PW:         1.54 cm  LV E/e' lateral: 15.1 LV IVS:        2.01 cm  LV e' medial:    4.24 cm/s LVOT diam:     1.90 cm  LV E/e' medial:  19.7 LV SV:         80 LV SV Index:   45 LVOT Area:     2.84 cm  RIGHT VENTRICLE             IVC RV S prime:  14.70 cm/s  IVC diam: 1.95 cm TAPSE (M-mode): 2.4 cm LEFT ATRIUM             Index       RIGHT ATRIUM           Index LA diam:        2.90 cm 1.62 cm/m  RA Area:     16.90 cm LA Vol (A2C):   53.9 ml 30.13 ml/m RA Volume:   51.20 ml  28.62 ml/m LA Vol (A4C):   36.6 ml 20.46 ml/m LA Biplane Vol: 46.6 ml 26.05 ml/m  AORTIC VALVE AV Area (Vmax):    1.61 cm AV Area (Vmean):   1.96 cm AV Area (VTI):     1.67 cm AV Vmax:           250.00 cm/s AV Vmean:          152.000 cm/s AV VTI:            0.481 m AV Peak Grad:      25.0 mmHg AV Mean Grad:      11.0 mmHg LVOT Vmax:         142.00 cm/s LVOT Vmean:        105.000 cm/s LVOT VTI:          0.283 m LVOT/AV VTI ratio: 0.59  AORTA Ao Root diam: 3.10 cm MITRAL VALVE                TRICUSPID VALVE MV Area (PHT): 3.16 cm     TR Peak grad:   36.2 mmHg MV Peak grad:  8.0 mmHg     TR Vmax:        301.00 cm/s MV Mean grad:  2.0 mmHg MV Vmax:       1.41 m/s     SHUNTS MV Vmean:      63.5 cm/s    Systemic VTI:  0.28 m MV Decel Time: 240 msec     Systemic Diam: 1.90 cm MV E velocity: 83.60 cm/s MV A velocity: 126.00 cm/s MV E/A ratio:  0.66 Zoila ShutterKenneth Hilty MD Electronically signed by Zoila ShutterKenneth Hilty MD Signature Date/Time: 06/12/2019/10:27:20 AM    Final    VAS US CAROTID  Result Date: 06/12/2019 Carotid Arterial Duplex Study Indications:       CVA. Risk Factors:      Hypertension, hyperlipidemia. Comparison Study:  No prior study  Performing Technologist: Gertie FeyMichelle Simonetti MHA, RDMS, RVT, RDCS  Examination Guidelines: A complete evaluation includes B-mode imaging, spectral Doppler, color Doppler, and power Doppler as needed of all accessible portions of each vessel. Bilateral testing is considered an integral part of a complete examination. Limited examinations for reoccurring indications may be performed as noted.  Right Carotid Findings: +----------+--------+-------+--------+--------------------------------+--------+           PSV cm/sEDV    StenosisPlaque Description              Comments                   cm/s                                                    +----------+--------+-------+--------+--------------------------------+--------+ CCA Prox  92      11                                                      +----------+--------+-------+--------+--------------------------------+--------+  CCA Distal89      15             smooth, heterogenous and                                                  calcific                                 +----------+--------+-------+--------+--------------------------------+--------+ ICA Prox  131     23             heterogenous, smooth and                                                  calcific                                 +----------+--------+-------+--------+--------------------------------+--------+ ICA Distal87      16                                                      +----------+--------+-------+--------+--------------------------------+--------+ ECA       188     11             smooth, heterogenous and                                                  calcific                                 +----------+--------+-------+--------+--------------------------------+--------+ +----------+--------+-------+----------------+-------------------+           PSV cm/sEDV cmsDescribe        Arm Pressure (mmHG)  +----------+--------+-------+----------------+-------------------+ [id number]            Multiphasic, WNL                    +----------+--------+-------+----------------+-------------------+ +---------+--------+--+--------+--+---------+ VertebralPSV cm/s54EDV cm/s10Antegrade +---------+--------+--+--------+--+---------+  Left Carotid Findings: +----------+--------+-------+--------+--------------------------------+--------+           PSV cm/sEDV    StenosisPlaque Description              Comments                   cm/s                                                    +----------+--------+-------+--------+--------------------------------+--------+ CCA Prox  120     23             smooth and heterogenous                  +----------+--------+-------+--------+--------------------------------+--------+  CCA Distal101     14             smooth, heterogenous and                                                  calcific                                 +----------+--------+-------+--------+--------------------------------+--------+ ICA Prox  103     23             heterogenous and calcific                +----------+--------+-------+--------+--------------------------------+--------+ ICA Distal115     21                                                      +----------+--------+-------+--------+--------------------------------+--------+ ECA       173     9              smooth, heterogenous and                                                  calcific                                 +----------+--------+-------+--------+--------------------------------+--------+ +----------+--------+--------+----------------+-------------------+           PSV cm/sEDV cm/sDescribe        Arm Pressure (mmHG) +----------+--------+--------+----------------+-------------------+ [id number]             Multiphasic, WNL                     +----------+--------+--------+----------------+-------------------+ +---------+--------+--+--------+-+---------+ VertebralPSV cm/s36EDV cm/s6Antegrade +---------+--------+--+--------+-+---------+   Summary: Right Carotid: Velocities in the right ICA are consistent with a 1-39% stenosis. Left Carotid: Velocities in the left ICA are consistent with a 1-39% stenosis. Vertebrals:  Bilateral vertebral arteries demonstrate antegrade flow. Subclavians: Normal flow hemodynamics were seen in bilateral subclavian              arteries. *See table(s) above for measurements and observations.  Electronically signed by Delia Heady MD on 06/12/2019 at 12:34:21 PM.    Final    VAS [place] TRANSCRANIAL DOPPLER  Result Date: 06/12/2019  Transcranial Doppler Indications: Stroke. History: Hypertension. Hyperlipidemia. Limitations: Poor acoustic windows Limitations for diagnostic windows: Unable to insonate right transtemporal window. Comparison Study: No prior study Performing Technologist: Gertie Fey MHA, RDMS, RVT, RDCS  Examination Guidelines: A complete evaluation includes B-mode imaging, spectral Doppler, color Doppler, and power Doppler as needed of all accessible portions of each vessel. Bilateral testing is considered an integral part of a complete examination. Limited examinations for reoccurring indications may be performed as noted.  +----------+-------------+----------+-----------+------------------+ RIGHT TCD Right VM (cm)Depth (cm)Pulsatility     Comment       +----------+-------------+----------+-----------+------------------+ MCA  Unable to insonate +----------+-------------+----------+-----------+------------------+ ACA                                         Unable to insonate +----------+-------------+----------+-----------+------------------+ Term ICA                                    Unable to insonate  +----------+-------------+----------+-----------+------------------+ PCA                                         Unable to insonate +----------+-------------+----------+-----------+------------------+ Opthalmic     22.00                 1.89                       +----------+-------------+----------+-----------+------------------+ ICA siphon    26.00                 1.74                       +----------+-------------+----------+-----------+------------------+ Vertebral    -25.00                 1.55                       +----------+-------------+----------+-----------+------------------+  +----------+------------+----------+-----------+------------------+ LEFT TCD  Left VM (cm)Depth (cm)Pulsatility     Comment       +----------+------------+----------+-----------+------------------+ MCA                                        Unable to insonate +----------+------------+----------+-----------+------------------+ ACA                                        Unable to insonate +----------+------------+----------+-----------+------------------+ Term ICA                                   Unable to insonate +----------+------------+----------+-----------+------------------+ PCA          37.00                 1.57                       +----------+------------+----------+-----------+------------------+ Opthalmic    25.00                 1.55                       +----------+------------+----------+-----------+------------------+ ICA siphon   27.00                 1.70                       +----------+------------+----------+-----------+------------------+ Vertebral    -15.00                1.27                       +----------+------------+----------+-----------+------------------+  +------------+-------+-------+  VM cm/sComment +------------+-------+-------+ Dist Basilar-25.00         +------------+-------+-------+ Summary:   Absent bitemporal and poor occipital windows limit exam. Antegrade flow in both opthalmics, carotid siphons, vertebrals and basilar arteries. *See table(s) above for TCD measurements and observations.  Diagnosing physician: Delia Heady MD Electronically signed by Delia Heady MD on 06/12/2019 at 12:35:44 PM.    Final      Labs:   Basic Metabolic Panel: Recent Labs  Lab 06/11/19 1617 06/12/19 0711  NA 144 142  K 3.8 3.8  CL 110 114*  CO2 19* 19*  GLUCOSE 120* 85  BUN 25* 17  CREATININE 1.52* 1.12*  CALCIUM 9.4 9.0   GFR Estimated Creatinine Clearance: 35.4 mL/min (A) (by C-G formula based on SCr of 1.12 mg/dL (H)). Liver Function Tests: Recent Labs  Lab 06/11/19 2253  AST 18  ALT 15  ALKPHOS 65  BILITOT 0.9  PROT 5.9*  ALBUMIN 3.1*   Recent Labs  Lab 06/11/19 2253  LIPASE 32   No results for input(s): AMMONIA in the last 168 hours. Coagulation profile Recent Labs  Lab 06/13/19 0722  INR 1.1    CBC: Recent Labs  Lab 06/11/19 1617  WBC 12.0*  HGB 11.8*  HCT 38.6  MCV 87.5  PLT 237   Cardiac Enzymes: No results for input(s): CKTOTAL, CKMB, CKMBINDEX, TROPONINI in the last 168 hours. BNP: Invalid input(s): POCBNP CBG: No results for input(s): GLUCAP in the last 168 hours. D-Dimer No results for input(s): DDIMER in the last 72 hours. Hgb A1c Recent Labs    06/12/19 0711  HGBA1C 5.7*   Lipid Profile Recent Labs    06/12/19 0711  CHOL 140  HDL 47  LDLCALC 72  TRIG 105  CHOLHDL 3.0   Thyroid function studies No results for input(s): TSH, T4TOTAL, T3FREE, THYROIDAB in the last 72 hours.  Invalid input(s): FREET3 Anemia work up Recent Labs    06/13/19 0722  [id number] 132*   Microbiology Recent Results (from the past 240 hour(s))  SARS Coronavirus 2 by RT PCR (hospital order, performed in [facility] hospital lab) Nasopharyngeal Nasopharyngeal Swab     Status: None   Collection Time: 06/11/19  9:16 PM   Specimen: Nasopharyngeal Swab    Result Value Ref Range Status   SARS Coronavirus 2 NEGATIVE NEGATIVE Final    Comment: (NOTE) SARS-CoV-2 target nucleic acids are NOT DETECTED. The SARS-CoV-2 RNA is generally detectable in upper and lower respiratory specimens during the acute phase of infection. The lowest concentration of SARS-CoV-2 viral copies this assay can detect is 250 copies / mL. A negative result does not preclude SARS-CoV-2 infection and should not be used as the sole basis for treatment or other patient management decisions.  A negative result may occur with improper specimen collection / handling, submission of specimen other than nasopharyngeal swab, presence of viral mutation(s) within the areas targeted by this assay, and inadequate number of viral copies (<250 copies / mL). A negative result must be combined with clinical observations, patient history, and epidemiological information. Fact Sheet for Patients:   BoilerBrush.com.cy Fact Sheet for Healthcare Providers: https://pope.com/ This test is not yet approved or cleared  by the [place] FDA and has been authorized for detection and/or diagnosis of SARS-CoV-2 by FDA under an Emergency Use Authorization (EUA).  This EUA will remain in effect (meaning this test can be used) for the duration of the COVID-19 declaration under Section 564(b)(1) of the Act, 21 U.S.C. section 360bbb-3(b)(1), unless  the authorization is terminated or revoked sooner. Performed at [facility], [address] [place], [place] [address]      Discharge Instructions:   Discharge Instructions    Ambulatory referral to Physical Therapy   Complete by: As directed    Iontophoresis - 4 mg/ml of dexamethasone: No   T.E.N.S. Unit Evaluation and Dispense as Indicated: No   Anticogulant already ordered   Complete by: As directed    Diet - low sodium heart healthy   Complete by: As directed    Discharge instructions    Complete by: As directed    Will need follow up of B12 levels Take a daily multivitamin Check BP at different times during the day and write down to bring to PCP   Increase activity slowly   Complete by: As directed    Increase activity slowly   Complete by: As directed      Allergies as of 06/13/2019      Reactions   Sulfamethoxazole-trimethoprim Diarrhea, Nausea Only      Medication List    STOP taking these medications   ibuprofen 200 MG tablet Commonly known as: ADVIL     TAKE these medications   allopurinol 300 MG tablet Commonly known as: ZYLOPRIM Take 300 mg by mouth daily.   amLODipine 5 MG tablet Commonly known as: NORVASC Take 1 tablet (5 mg total) by mouth daily. What changed:   medication strength  how much to take   ascorbic acid 1000 MG tablet Commonly known as: VITAMIN C Take 1 tablet (1,000 mg total) by mouth daily.   aspirin 81 MG EC tablet Take 1 tablet (81 mg total) by mouth daily.   budesonide 3 MG 24 hr capsule Commonly known as: ENTOCORT EC Take 1 capsule (3 mg total) by mouth daily.   carvedilol 25 MG tablet Commonly known as: COREG Take 1 tablet (25 mg total) by mouth daily.   cyanocobalamin 1000 MCG tablet Take 1 tablet (1,000 mcg total) by mouth daily. Start taking on: June 14, 2019   levothyroxine 88 MCG tablet Commonly known as: SYNTHROID Take 88 mcg by mouth daily before breakfast.   lisinopril 40 MG tablet Commonly known as: ZESTRIL Take 1 tablet (40 mg total) by mouth daily. Start taking on: June 16, 2019 What changed: These instructions start on June 16, 2019. If you are unsure what to do until then, ask your doctor or other care provider.   simvastatin 40 MG tablet Commonly known as: ZOCOR Take 40 mg by mouth daily. What changed: Another medication with the same name was removed. Continue taking this medication, and follow the directions you see here.   traMADol 50 MG tablet Commonly known as: ULTRAM Take 1  tablet (50 mg total) by mouth every 12 (twelve) hours as needed. What changed: reasons to take this      Follow-up Information    [facility] Follow up.   Specialty: Rehabilitation Why: They will call you for an appointment  Contact information: [address] [id number] [place] [address] [phone]       Clovis Riley, L.August Saucer, MD Follow up in 1 week(s).   Specialty: Family Medicine Contact information: [address] [place] [address] [phone]        Jodelle Red, MD Follow up.   Specialty: Cardiology Why: message sent regarding 30 day event monitor-- if you have not heard from the office by nect Monday, call to follow up  Contact information: [address] [place] [address] [place] [address] [phone]            Time coordinating discharge: 35 min  Signed:  Joseph Art DO  [facility] 06/13/2019, 9:54 AM

## 2019-06-13 NOTE — Progress Notes (Addendum)
Pt refused BP checks during the night, reports her arms are sore and bruised from previous times it was taken. md made aware.

## 2019-06-13 NOTE — Progress Notes (Signed)
Nsg Discharge Note  Admit Date:  06/11/2019 Discharge date: 06/13/2019   Sharon Bailey to be D/C'd Home per MD order.  AVS completed.  Copy for chart, and copy for patient signed, and dated. Patient/caregiver able to verbalize understanding.  Discharge Medication: Allergies as of 06/13/2019      Reactions   Sulfamethoxazole-trimethoprim Diarrhea, Nausea Only      Medication List    STOP taking these medications   ibuprofen 200 MG tablet Commonly known as: ADVIL     TAKE these medications   allopurinol 300 MG tablet Commonly known as: ZYLOPRIM Take 300 mg by mouth daily.   amLODipine 5 MG tablet Commonly known as: NORVASC Take 1 tablet (5 mg total) by mouth daily. What changed:   medication strength  how much to take   ascorbic acid 1000 MG tablet Commonly known as: VITAMIN C Take 1 tablet (1,000 mg total) by mouth daily.   aspirin 81 MG EC tablet Take 1 tablet (81 mg total) by mouth daily.   budesonide 3 MG 24 hr capsule Commonly known as: ENTOCORT EC Take 1 capsule (3 mg total) by mouth daily.   carvedilol 25 MG tablet Commonly known as: COREG Take 1 tablet (25 mg total) by mouth daily.   cyanocobalamin 1000 MCG tablet Take 1 tablet (1,000 mcg total) by mouth daily. Start taking on: June 14, 2019   levothyroxine 88 MCG tablet Commonly known as: SYNTHROID Take 88 mcg by mouth daily before breakfast.   lisinopril 40 MG tablet Commonly known as: ZESTRIL Take 1 tablet (40 mg total) by mouth daily. Start taking on: June 16, 2019 What changed: These instructions start on June 16, 2019. If you are unsure what to do until then, ask your doctor or other care provider.   simvastatin 40 MG tablet Commonly known as: ZOCOR Take 40 mg by mouth daily. What changed: Another medication with the same name was removed. Continue taking this medication, and follow the directions you see here.   traMADol 50 MG tablet Commonly known as: ULTRAM Take 1 tablet (50 mg total) by  mouth every 12 (twelve) hours as needed. What changed: reasons to take this       Discharge Assessment: Vitals:   06/13/19 0814 06/13/19 0944  BP: (!) 147/89   Pulse: 72 76  Resp: 17   Temp: 98.4 F (36.9 C)   SpO2: 100%    Skin clean, dry and intact without evidence of skin break down, no evidence of skin tears noted. IV catheter discontinued intact. Site without signs and symptoms of complications - no redness or edema noted at insertion site, patient denies c/o pain - only slight tenderness at site.  Dressing with slight pressure applied.  D/c Instructions-Education: Discharge instructions given to patient/family with verbalized understanding. D/c education completed with patient/family including follow up instructions, medication list, d/c activities limitations if indicated, with other d/c instructions as indicated by MD - patient able to verbalize understanding, all questions fully answered. Patient instructed to return to ED, call 911, or call MD for any changes in condition.  Patient escorted via WC, and D/C home via private auto.  Boykin Nearing, RN 06/13/2019 12:59 PM

## 2019-06-13 NOTE — Progress Notes (Signed)
STROKE TEAM PROGRESS NOTE   INTERVAL HISTORY Patient is sitting up in bed smiling ready to go home.  She has no complaints.  Vital signs stable. Vitals:   06/12/19 1947 06/12/19 2314 06/13/19 0814 06/13/19 0944  BP: (!) 147/66  (!) 147/89   Pulse: 75 75 72 76  Resp: 17 17 17    Temp: 98.4 F (36.9 C) 98.6 F (37 C) 98.4 F (36.9 C)   TempSrc: Oral Oral Oral   SpO2: 100%  100%   Weight:      Height:        CBC:  Recent Labs  Lab 06/11/19 1617  WBC 12.0*  HGB 11.8*  HCT 38.6  MCV 87.5  PLT 053    Basic Metabolic Panel:  Recent Labs  Lab 06/11/19 1617 06/12/19 0711  NA 144 142  K 3.8 3.8  CL 110 114*  CO2 19* 19*  GLUCOSE 120* 85  BUN 25* 17  CREATININE 1.52* 1.12*  CALCIUM 9.4 9.0   Lipid Panel:     Component Value Date/Time   CHOL 140 06/12/2019 0711   TRIG 105 06/12/2019 0711   HDL 47 06/12/2019 0711   CHOLHDL 3.0 06/12/2019 0711   VLDL 21 06/12/2019 0711   LDLCALC 72 06/12/2019 0711   HgbA1c:  Lab Results  Component Value Date   HGBA1C 5.7 (H) 06/12/2019   Urine Drug Screen: No results found for: LABOPIA, COCAINSCRNUR, LABBENZ, AMPHETMU, THCU, LABBARB  Alcohol Level No results found for: ETH  IMAGING past 24 hours No results found.  PHYSICAL EXAM Pleasant elderly Caucasian lady not in distress. . Afebrile. Head is nontraumatic. Neck is supple without bruit.    Cardiac exam no murmur or gallop. Lungs are clear to auscultation. Distal pulses are well felt. Neurological Exam ;  Awake  Alert oriented x 3. Normal speech and language.eye movements full without nystagmus.fundi were not visualized. Vision acuity and fields appear normal. Hearing is diminished bilaterally.. Palatal movements are normal. Face symmetric. Tongue midline. Normal strength, tone, reflexes and coordination. Normal sensation. Gait deferred.  ASSESSMENT/PLAN [name] is a 86 y.o. female with history of HTN, HLD, arrhythmia not on AC presenting "feeling woozy" with gait  difficulties with nausea and vomiting.    Stroke:   Incidental Punctate R frontal lobe infarct secondary to small vessel disease vs embolic source in a patient who presented with hypertensive crisis and dizziness nausea and gait ataxia related to it  CT head No acute abnormality. Small vessel disease. Atrophy.   MRI  Punctate cortical infarct posterior R frontal lobe. Small vessel disease. Atrophy. Old R cerebellar lacune. Sinus dz  TCD absent bitemporal and occipital windows. Antegrade flow B opthalmics, carotid siphons, VAs and BA.   Carotid Doppler  B ICA 1-39% stenosis, VAs antegrade   2D Echo EF 65-70%. No source of embolus   LDL 72  HgbA1c 5.7  Lovenox 40 mg sq daily for VTE prophylaxis  No antithrombotic prior to admission, now on aspirin 325 mg daily. Decrease aspirin to 81 mg daily and continue at d/c    Therapy recommendations:  OP PT  Disposition:  Return home  Consider OP 30d monitor to look for AF  Hypertensive Urgency  BP as high as 208/80  Remains elevated 190-210s today . Permissive hypertension (OK if < 220/120) but gradually normalize in 5-7 days . Long-term BP goal normotensive  Hyperlipidemia  Home meds:  zocor 40, resumed in hospital  LDL 72, goal < 70  Will  not given additional statin given advanced age and LDL barely above goal  Continue statin at discharge  Other Stroke Risk Factors  Advanced age  Overweight, Body mass index is 27.81 kg/m., recommend weight loss, diet and exercise as appropriate   Other Active Problems  AKI on CKD stage III  Mild normal anion gap metabolic acidosis  Hypothyroidism   Gout   Baseline anxiety per daughter  Hospital day # 2 She presented with dizziness, nausea and ataxia's in the setting of significantly elevated blood pressure and is now feeling better.  MRI shows likely incidental asymptomatic tiny right frontal infarct etiology small vessel disease versus embolic.  Recommend aspirin 81 mg daily  alone and continue cardiac monitoring while in the hospital and recommend outpatient 30-day heart monitor for paroxysmal A. fib.  Aggressive risk factor modification.  Stroke team will sign off.  Kindly call for questions. Delia Heady, MD To contact Stroke Continuity provider, please refer to [email or web address]. After hours, contact General Neurology

## 2019-06-14 ENCOUNTER — Telehealth: Payer: Self-pay | Admitting: *Deleted

## 2019-06-14 ENCOUNTER — Other Ambulatory Visit: Payer: Self-pay | Admitting: Physician Assistant

## 2019-06-14 ENCOUNTER — Encounter: Payer: Self-pay | Admitting: *Deleted

## 2019-06-14 DIAGNOSIS — I639 Cerebral infarction, unspecified: Secondary | ICD-10-CM

## 2019-06-14 NOTE — Telephone Encounter (Signed)
Patient enrolled for [employer] to ship a 30 day cardiac event monitor to her home.  A letter with monitor hookup instructions mailed to patients home.  Monitor kit will include an instruction manual.

## 2019-06-18 LAB — VITAMIN C: Vitamin C: 0.6 mg/dL (ref 0.4–2.0)

## 2019-06-29 ENCOUNTER — Telehealth: Payer: Self-pay | Admitting: Cardiology

## 2019-06-29 NOTE — Telephone Encounter (Signed)
Spoke to pt who state she does not want to wear the monitor. She feel it's too complicated and said her skin is sensitive. Nurse offered to schedule an appointment to have monitor placed for pt but she refused. Pt state she plan to mail monitor back and wanted to make Dr. [name] aware.

## 2019-06-29 NOTE — Telephone Encounter (Signed)
New Message:     Please call, pt says she is not going to be able to use the heart monitor that she received.

## 2019-06-29 NOTE — Telephone Encounter (Signed)
Returned call to patient no answer.LMTC. 

## 2019-07-03 ENCOUNTER — Ambulatory Visit: Payer: Medicare PPO | Admitting: Family Medicine

## 2019-07-03 ENCOUNTER — Other Ambulatory Visit: Payer: Self-pay

## 2019-07-03 ENCOUNTER — Encounter: Payer: Self-pay | Admitting: Family Medicine

## 2019-07-03 DIAGNOSIS — M79605 Pain in left leg: Secondary | ICD-10-CM

## 2019-07-03 MED ORDER — HYDROCODONE-ACETAMINOPHEN 5-325 MG PO TABS: 1.0000 | ORAL_TABLET | Freq: Four times a day (QID) | ORAL | 0 refills | Status: DC | PRN

## 2019-07-03 MED ORDER — BACLOFEN 10 MG PO TABS: 5.0000 mg | ORAL_TABLET | Freq: Every evening | ORAL | 3 refills | Status: DC | PRN

## 2019-07-03 NOTE — Progress Notes (Signed)
Office Visit Note   Patient: Sharon Bailey           Date of Birth: [birth year]           MRN: [id number] Visit Date: 07/03/2019 Requested by: Alroy Dust, L.Marlou Sa, [place] [employer] [place],  [place] [address] PCP: Alroy Dust, L.Marlou Sa, MD  Subjective: Chief Complaint  Patient presents with  . Left Thigh - Pain    Pain from knee up to groin since last week. NKI. Hurting more in the groin, especially with walking. Throbbing pain now in the groin even with sitting.    HPI: She is here for left leg pain.  Symptoms started last week, no injury.  She woke up 1 night with pain from the medial aspect of her left knee up to the left groin area.  Pain was severe, she could not find a position to alleviate it.  Since then the pain has eased off a little bit but not completely.  She has pain whenever she tries to walk.  She continues to have low back pain.  She has not yet been called to schedule her MRI scan.  She is using tramadol but it does not seem to be helping.                ROS: No bowel or bladder dysfunction.  All other systems were reviewed and are negative.  Objective: Vital Signs: There were no vitals taken for this visit.  Physical Exam:  General:  Alert and oriented, in no acute distress. Pulm:  Breathing unlabored. Psy:  Normal mood, congruent affect. Skin: No rash Left leg: There is mild pain with passive hip flexion and internal rotation but it does not reproduce her pain.  No tenderness over the greater trochanter.  No tenderness to palpation of the hip flexor tendon.  Lower extremity strength and reflexes remain normal.  Imaging: No results found.  Assessment & Plan: 1.  Left leg pain, cannot rule out lumbar radiculopathy. -Norco and baclofen as needed.  Proceed with lumbar MRI scan.     Procedures: No procedures performed  No notes on file     PMFS History: Patient Active Problem List   Diagnosis Date Noted  . Hypertensive emergency 06/12/2019    . Acute CVA (cerebrovascular accident) ([place]) 06/11/2019  . Nausea and vomiting 06/11/2019  . AKI (acute kidney injury) ([place]) 06/11/2019  . Metabolic acidosis 27/51/7001  . 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 Obstructive sleep apnea 10/31/2018  . Sensorineural hearing loss (SNHL) of both ears 10/31/2018  . Low back pain 09/21/2017  . Chronic fatigue 02/05/2017  . Atypical chest pain 02/05/2017  . Hyperlipidemia 02/05/2017  . Palpitations 04/18/2016  . HTN (hypertension) 04/18/2016  . Hypertensive heart disease without heart failure 04/18/2016  . Status post foot surgery 10/11/2012  . Gout attack 09/28/2012  . Hammer toe of left foot 09/28/2012  . Pain in joint, ankle and foot 09/28/2012   Past Medical History:  Diagnosis Date  . Abnormal EKG   . Collagenous colitis   . Collagenous colitis   . Diverticulitis   . Diverticulosis   . Fluttering heart   . GERD (gastroesophageal reflux disease)   . Gout   . Gout attack 09/28/2012  . Hammer toe of left foot 09/28/2012  . Hyperlipidemia   . Hypertension   . Hypertensive emergency   . Hypothyroidism   . Irregular heart beat   . Pain in joint, ankle and foot  09/28/2012  . Status post foot surgery 10/11/2012  . Thyroid disease     Family History  Problem Relation Age of Onset  . Breast cancer Mother   . Memory loss Father   . Heart disease Sister   . Rheumatic fever Sister   . Lung disease Brother   . Colon cancer Neg Hx     Past Surgical History:  Procedure Laterality Date  . ABDOMINAL HYSTERECTOMY    . ADENOIDECTOMY    . CHOLECYSTECTOMY    . Excision Ganglion Toe Left 09/29/2012   Lt #2 @ [facility]  . Hammer toe repair Left 09/29/2012   Lt #2 @ [facility]  . TONSILLECTOMY     Social History   Occupational History  . Not on file  Tobacco Use  . Smoking status: Never Smoker  . Smokeless tobacco: Never Used  Vaping Use  . Vaping Use: Never used  Substance and Sexual Activity  . Alcohol use: No    Alcohol/week:  0.0 standard drinks  . Drug use: No  . Sexual activity: Not on file

## 2019-07-04 NOTE — Progress Notes (Signed)
I called and advised the patient of [facility]'s phone number - she will call them to schedule her MRI.

## 2019-07-05 ENCOUNTER — Other Ambulatory Visit: Payer: Self-pay

## 2019-07-05 ENCOUNTER — Emergency Department (HOSPITAL_BASED_OUTPATIENT_CLINIC_OR_DEPARTMENT_OTHER): Payer: Medicare PPO

## 2019-07-05 ENCOUNTER — Emergency Department (HOSPITAL_COMMUNITY): Payer: Medicare PPO

## 2019-07-05 ENCOUNTER — Emergency Department (HOSPITAL_COMMUNITY)
Admission: EM | Admit: 2019-07-05 | Discharge: 2019-07-05 | Disposition: A | Discharge status: Home / Self Care | Payer: Medicare PPO | Attending: Emergency Medicine | Admitting: Emergency Medicine

## 2019-07-05 ENCOUNTER — Encounter (HOSPITAL_COMMUNITY): Payer: Self-pay | Admitting: Pediatrics

## 2019-07-05 DIAGNOSIS — E039 Hypothyroidism, unspecified: Secondary | ICD-10-CM | POA: Diagnosis not present

## 2019-07-05 DIAGNOSIS — I10 Essential (primary) hypertension: Secondary | ICD-10-CM | POA: Diagnosis not present

## 2019-07-05 DIAGNOSIS — Z79899 Other long term (current) drug therapy: Secondary | ICD-10-CM | POA: Insufficient documentation

## 2019-07-05 DIAGNOSIS — Z7982 Long term (current) use of aspirin: Secondary | ICD-10-CM | POA: Diagnosis not present

## 2019-07-05 DIAGNOSIS — R4182 Altered mental status, unspecified: Secondary | ICD-10-CM | POA: Diagnosis present

## 2019-07-05 DIAGNOSIS — M79652 Pain in left thigh: Secondary | ICD-10-CM

## 2019-07-05 DIAGNOSIS — B029 Zoster without complications: Secondary | ICD-10-CM | POA: Insufficient documentation

## 2019-07-05 DIAGNOSIS — M79604 Pain in right leg: Secondary | ICD-10-CM | POA: Diagnosis not present

## 2019-07-05 LAB — COMPREHENSIVE METABOLIC PANEL
ALT: 28 U/L (ref 0–44)
AST: 39 U/L (ref 15–41)
Albumin: 3.5 g/dL (ref 3.5–5.0)
Alkaline Phosphatase: 63 U/L (ref 38–126)
Anion gap: 13 (ref 5–15)
BUN: 20 mg/dL (ref 8–23)
CO2: 18 mmol/L — ABNORMAL LOW (ref 22–32)
Calcium: 10 mg/dL (ref 8.9–10.3)
Chloride: 109 mmol/L (ref 98–111)
Creatinine, Ser: 1.2 mg/dL — ABNORMAL HIGH (ref 0.44–1.00)
GFR calc Af Amer: 47 mL/min — ABNORMAL LOW (ref >60)
GFR calc non Af Amer: 41 mL/min — ABNORMAL LOW (ref >60)
Glucose, Bld: 114 mg/dL — ABNORMAL HIGH (ref 70–99)
Potassium: 4.4 mmol/L (ref 3.5–5.1)
Sodium: 140 mmol/L (ref 135–145)
Total Bilirubin: 1 mg/dL (ref 0.3–1.2)
Total Protein: 6.9 g/dL (ref 6.5–8.1)

## 2019-07-05 LAB — CBC
HCT: 39.3 % (ref 36.0–46.0)
Hemoglobin: 12.2 g/dL (ref 12.0–15.0)
MCH: 26.6 pg (ref 26.0–34.0)
MCHC: 31 g/dL (ref 30.0–36.0)
MCV: 85.6 fL (ref 80.0–100.0)
Platelets: 244 10*3/uL (ref 150–400)
RBC: 4.59 MIL/uL (ref 3.87–5.11)
RDW: 15.8 % — ABNORMAL HIGH (ref 11.5–15.5)
WBC: 9.2 10*3/uL (ref 4.0–10.5)
nRBC: 0 % (ref 0.0–0.2)

## 2019-07-05 MED ORDER — SODIUM CHLORIDE 0.9% FLUSH: 3.0000 mL | Freq: Once | INTRAVENOUS | Status: DC

## 2019-07-05 MED ORDER — HYDROCODONE-ACETAMINOPHEN 5-325 MG PO TABS
1.0000 | ORAL_TABLET | Freq: Once | ORAL | Status: AC
  Administered 2019-07-05: 1 via ORAL
  Filled 2019-07-05: qty 1

## 2019-07-05 MED ORDER — ONDANSETRON 4 MG PO TBDP
4.0000 mg | ORAL_TABLET | Freq: Once | ORAL | Status: AC
  Administered 2019-07-05: 4 mg via ORAL
  Filled 2019-07-05: qty 1

## 2019-07-05 MED ORDER — VALACYCLOVIR HCL 1 G PO TABS
1000.0000 mg | ORAL_TABLET | Freq: Two times a day (BID) | ORAL | 0 refills | Status: AC
Start: 2019-07-05 — End: 2019-07-12

## 2019-07-05 MED ORDER — ONDANSETRON 4 MG PO TBDP
4.0000 mg | ORAL_TABLET | Freq: Three times a day (TID) | ORAL | 0 refills | Status: DC | PRN
Start: 2019-07-05 — End: 2020-02-22

## 2019-07-05 MED ORDER — VALACYCLOVIR HCL 500 MG PO TABS
1000.0000 mg | ORAL_TABLET | Freq: Once | ORAL | Status: AC
  Administered 2019-07-05: 1000 mg via ORAL
  Filled 2019-07-05: qty 2

## 2019-07-05 NOTE — ED Triage Notes (Signed)
Patient came in via EMS from home. Reported [facility] 1030 PM and recent TIA 3 weeks ago. Family concern for "not acting herself" stated has had some intermittent confusion. C/O ongoing groin pain as well.

## 2019-07-05 NOTE — ED Provider Notes (Signed)
[facility] EMERGENCY DEPARTMENT Provider Note   CSN: [id number] Arrival date & time: 07/05/19  1231     History Chief Complaint  Patient presents with  . Altered Mental Status    Sharon Bailey is a 86 y.o. female.  The history is provided by the patient, medical records and a relative. No language interpreter was used.  Altered Mental Status  Sharon Bailey is a 87 y.o. female who presents to the Emergency Department complaining of groin pain. She presents to the emergency department accompanied by her daughter for evaluation of atraumatic left thigh and groin pain. Symptoms began about one week ago. Pain is constant with no clear alleviating or worsening factors. She saw orthopedics a few days ago and was prescribed Norco for the pain. She did take some Norco for the pain with mild improvement. Today family notes that she seemed a little bit more confused when they came to her home. Though symptoms did resolve. No fevers, chest pain, shortness of breath, headache, numbness, weakness, recent falls. She did have nausea and one episode of emesis this morning after taking her Norco. She was recently admitted to the hospital a few weeks ago for a stroke and was started on aspirin. Family is concerned for a blood clot.     Past Medical History:  Diagnosis Date  . Abnormal EKG   . Collagenous colitis   . Collagenous colitis   . Diverticulitis   . Diverticulosis   . Fluttering heart   . GERD (gastroesophageal reflux disease)   . Gout   . Gout attack 09/28/2012  . Hammer toe of left foot 09/28/2012  . Hyperlipidemia   . Hypertension   . Hypertensive emergency   . Hypothyroidism   . Irregular heart beat   . Pain in joint, ankle and foot 09/28/2012  . Status post foot surgery 10/11/2012  . Thyroid disease     Patient Active Problem List   Diagnosis Date Noted  . Hypertensive emergency 06/12/2019  . Acute CVA (cerebrovascular accident) (HCC) 06/11/2019  . Nausea  and vomiting 06/11/2019  . AKI (acute kidney injury) (HCC) 06/11/2019  . Metabolic acidosis 06/11/2019  . 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 Obstructive sleep apnea 10/31/2018  . Sensorineural hearing loss (SNHL) of both ears 10/31/2018  . Low back pain 09/21/2017  . Chronic fatigue 02/05/2017  . Atypical chest pain 02/05/2017  . Hyperlipidemia 02/05/2017  . Palpitations 04/18/2016  . HTN (hypertension) 04/18/2016  . Hypertensive heart disease without heart failure 04/18/2016  . Status post foot surgery 10/11/2012  . Gout attack 09/28/2012  . Hammer toe of left foot 09/28/2012  . Pain in joint, ankle and foot 09/28/2012    Past Surgical History:  Procedure Laterality Date  . ABDOMINAL HYSTERECTOMY    . ADENOIDECTOMY    . CHOLECYSTECTOMY    . Excision Ganglion Toe Left 09/29/2012   Lt #2 @ [facility]  . Hammer toe repair Left 09/29/2012   Lt #2 @ [facility]  . TONSILLECTOMY       OB History   No obstetric history on file.     Family History  Problem Relation Age of Onset  . Breast cancer Mother   . Memory loss Father   . Heart disease Sister   . Rheumatic fever Sister   . Lung disease Brother   . Colon cancer Neg Hx     Social History   Tobacco Use  . Smoking status: Never Smoker  .  Smokeless tobacco: Never Used  Vaping Use  . Vaping Use: Never used  Substance Use Topics  . Alcohol use: No    Alcohol/week: 0.0 standard drinks  . Drug use: No    Home Medications Prior to Admission medications   Medication Sig Start Date End Date Taking? Authorizing Provider  allopurinol (ZYLOPRIM) 300 MG tablet Take 300 mg by mouth daily.    [provider]  amLODipine (NORVASC) 5 MG tablet Take 1 tablet (5 mg total) by mouth daily. 06/13/19   Joseph Art, DO  ascorbic acid (VITAMIN C) 1000 MG tablet Take 1 tablet (1,000 mg total) by mouth daily. 06/13/19   Joseph Art, DO  aspirin EC 81 MG EC tablet Take 1 tablet (81 mg total) by mouth daily. 06/13/19    Joseph Art, DO  baclofen (LIORESAL) 10 MG tablet Take 0.5-1 tablets (5-10 mg total) by mouth at bedtime as needed for muscle spasms. 07/03/19   Hilts, Casimiro Needle, MD  budesonide (ENTOCORT EC) 3 MG 24 hr capsule Take 1 capsule (3 mg total) by mouth daily. 02/22/19   Hilarie Fredrickson, MD  carvedilol (COREG) 25 MG tablet Take 1 tablet (25 mg total) by mouth daily. 03/09/18   Georgie Chard D, NP  HYDROcodone-acetaminophen (NORCO/VICODIN) 5-325 MG tablet Take 1 tablet by mouth every 6 (six) hours as needed for moderate pain. 07/03/19   Hilts, Casimiro Needle, MD  levothyroxine (SYNTHROID) 88 MCG tablet Take 88 mcg by mouth daily before breakfast.    [provider]  lisinopril (ZESTRIL) 40 MG tablet Take 1 tablet (40 mg total) by mouth daily. 06/16/19   Joseph Art, DO  ondansetron (ZOFRAN ODT) 4 MG disintegrating tablet Take 1 tablet (4 mg total) by mouth every 8 (eight) hours as needed for nausea or vomiting. 07/05/19   Tilden Fossa, MD  simvastatin (ZOCOR) 40 MG tablet Take 40 mg by mouth daily.    [provider]  valACYclovir (VALTREX) 1000 MG tablet Take 1 tablet (1,000 mg total) by mouth 2 (two) times daily for 7 days. 07/05/19 07/12/19  Tilden Fossa, MD  vitamin B-12 1000 MCG tablet Take 1 tablet (1,000 mcg total) by mouth daily. 06/14/19   Joseph Art, DO    Allergies    Sulfamethoxazole-trimethoprim  Review of Systems   Review of Systems  All other systems reviewed and are negative.   Physical Exam Updated Vital Signs BP 103/63 (BP Location: Left Arm)   Pulse 60   Temp 98.9 F (37.2 C) (Oral)   Resp 16   Ht 5\' 4"  (1.626 m)   Wt 68.9 kg   SpO2 98%   BMI 26.09 kg/m   Physical Exam Vitals and nursing note reviewed.  Constitutional:      Appearance: She is well-developed.  HENT:     Head: Normocephalic and atraumatic.  Cardiovascular:     Rate and Rhythm: Normal rate and regular rhythm.     Heart sounds: No murmur heard.   Pulmonary:     Effort: Pulmonary  effort is normal. No respiratory distress.     Breath sounds: Normal breath sounds.  Abdominal:     Palpations: Abdomen is soft.     Tenderness: There is no abdominal tenderness. There is no guarding or rebound.  Musculoskeletal:        General: No tenderness.     Comments: There is a vesicular rash to the left inner thigh and posterior left leg. Rash extends from the groin to the  knee. 2+ femoral and DP pulses bilaterally.  Skin:    General: Skin is warm and dry.  Neurological:     Mental Status: She is alert and oriented to person, place, and time.     Comments: No asymmetry of facial movements. Five out of five strength in all four extremities.  Psychiatric:     Comments: Anxious appearing     ED Results / Procedures / Treatments   Labs (all labs ordered are listed, but only abnormal results are displayed) Labs Reviewed  COMPREHENSIVE METABOLIC PANEL - Abnormal; Notable for the following components:      Result Value   CO2 18 (*)    Glucose, Bld 114 (*)    Creatinine, Ser 1.20 (*)    GFR calc non Af Amer 41 (*)    GFR calc Af Amer 47 (*)    All other components within normal limits  CBC - Abnormal; Notable for the following components:   RDW 15.8 (*)    All other components within normal limits  CBG MONITORING, ED    EKG EKG Interpretation  Date/Time:  Wednesday July 05 2019 12:35:45 EDT Ventricular Rate:  95 PR Interval:  130 QRS Duration: 94 QT Interval:  360 QTC Calculation: 452 R Axis:   -52 Text Interpretation: Normal sinus rhythm Left axis deviation Left ventricular hypertrophy ( R in aVL , Cornell product , Romhilt-Estes ) Abnormal ECG Confirmed by Tilden Fossa [phone]) on 07/05/2019 3:04:01 PM   Radiology CT Head Wo Contrast  Result Date: 07/05/2019 CLINICAL DATA:  Altered mental status today. EXAM: CT HEAD WITHOUT CONTRAST TECHNIQUE: Contiguous axial images were obtained from the base of the skull through the vertex without intravenous contrast.  COMPARISON:  Head CT and brain MRI 06/11/2018. FINDINGS: Brain: No evidence of acute infarction, hemorrhage, hydrocephalus, extra-axial collection or mass lesion/mass effect. Mild to moderate atrophy and chronic microvascular ischemic change are again seen. Vascular: Atherosclerosis. Skull: Intact.  No focal lesion. Sinuses/Orbits: Status post cataract surgery.  Otherwise negative. Other: None. IMPRESSION: No acute abnormality. Stable appearance of mild to moderate atrophy and microvascular ischemic change. Electronically Signed   By: Drusilla Kanner M.D.   On: 07/05/2019 14:32   VAS [place] LOWER EXTREMITY VENOUS (DVT) (ONLY [facility] & [facility])  Result Date: 07/05/2019  Lower Venous DVTStudy Indications: Swelling, and Pain.  Limitations: Restricted mobility. Comparison Study: No prior study Performing Technologist: Gertie Fey MHA, RDMS, RVT, RDCS  Examination Guidelines: A complete evaluation includes B-mode imaging, spectral Doppler, color Doppler, and power Doppler as needed of all accessible portions of each vessel. Bilateral testing is considered an integral part of a complete examination. Limited examinations for reoccurring indications may be performed as noted. The reflux portion of the exam is performed with the patient in reverse Trendelenburg.  Right Technical Findings: Not visualized segments include CFV.  +---------+---------------+---------+-----------+----------+--------------+ LEFT     CompressibilityPhasicitySpontaneityPropertiesThrombus Aging +---------+---------------+---------+-----------+----------+--------------+ CFV      Full           Yes      Yes                                 +---------+---------------+---------+-----------+----------+--------------+ SFJ      Full                                                        +---------+---------------+---------+-----------+----------+--------------+  FV Prox  Full                                                         +---------+---------------+---------+-----------+----------+--------------+ FV Mid   Full                                                        +---------+---------------+---------+-----------+----------+--------------+ FV DistalFull                                                        +---------+---------------+---------+-----------+----------+--------------+ PFV      Full                                                        +---------+---------------+---------+-----------+----------+--------------+ POP      Full           Yes      Yes                                 +---------+---------------+---------+-----------+----------+--------------+ PTV      Full                                                        +---------+---------------+---------+-----------+----------+--------------+ PERO     Full                                                        +---------+---------------+---------+-----------+----------+--------------+     Summary: LEFT: - There is no evidence of deep vein thrombosis in the lower extremity.  - No cystic structure found in the popliteal fossa.  *See table(s) above for measurements and observations.    Preliminary     Procedures Procedures (including critical care time)  Medications Ordered in ED Medications  sodium chloride flush (NS) 0.9 % injection 3 mL (3 mLs Intravenous Not Given 07/05/19 1521)  HYDROcodone-acetaminophen (NORCO/VICODIN) 5-325 MG per tablet 1 tablet (1 tablet Oral Given 07/05/19 1559)  ondansetron (ZOFRAN-ODT) disintegrating tablet 4 mg (4 mg Oral Given 07/05/19 1559)  valACYclovir (VALTREX) tablet 1,000 mg (1,000 mg Oral Given 07/05/19 1657)    ED Course  I have reviewed the triage vital signs and the nursing notes.  Pertinent labs & imaging results that were available during my care of the patient were reviewed by me and considered in my medical decision making (see chart for details).    MDM  Rules/Calculators/A&P  Patient with recent CVA here for evaluation of left thigh pain. She did start hydrocodone for her pain and had an episode of confusion this morning. On examination she has a zoster rash in the left thigh with no evidence of bacterial superinfection. She has a nonfocal neurologic examination. Presentation is not consistent with acute CVA. Vascular ultrasound is negative for DVT. Discussed with patient and daughter home care for shingles. Discussed outpatient follow-up and return precautions.  Final Clinical Impression(s) / ED Diagnoses Final diagnoses:  Herpes zoster without complication    Rx / DC Orders ED Discharge Orders         Ordered    valACYclovir (VALTREX) 1000 MG tablet  2 times daily     Discontinue  Reprint     07/05/19 1546    ondansetron (ZOFRAN ODT) 4 MG disintegrating tablet  Every 8 hours PRN     Discontinue  Reprint     07/05/19 1546           Tilden Fossaees, Jerrick Farve, MD 07/05/19 1708

## 2019-07-05 NOTE — Progress Notes (Signed)
Left lower extremity venous duplex completed. Refer to "CV Proc" under chart review to view preliminary results.  07/05/2019 4:43 PM Eula Fried., MHA, RVT, RDCS, RDMS

## 2019-07-06 ENCOUNTER — Telehealth: Payer: Self-pay | Admitting: Family Medicine

## 2019-07-06 ENCOUNTER — Other Ambulatory Visit: Payer: Self-pay | Admitting: Family Medicine

## 2019-07-06 MED ORDER — HYDROCODONE-ACETAMINOPHEN 5-325 MG PO TABS: 1.0000 | ORAL_TABLET | ORAL | 0 refills | Status: DC | PRN

## 2019-07-06 NOTE — Telephone Encounter (Signed)
Patient's daughter called. She would like Dr. Prince Rome to call her. [phone] to discuss her medication.

## 2019-07-06 NOTE — Telephone Encounter (Signed)
I called and spoke with Diane. The patient took 2 Hydrocodone/acetaminophin x 2 days. Was very "loopy," so she had an ED visit yesterday. Was diagnosed with shingles. Started Valacyclovir. Diane says the patient's pain is controlled if she takes 1/2 of a hydrocodone every 3 hours (not taking the muscle relaxer). Dr. Prince Rome said this was ok to do, and he sent in a refill of the hydrocodone/acet, so that she will not run out over the holiday weekend into next week.

## 2019-07-07 ENCOUNTER — Encounter: Payer: Self-pay | Admitting: Cardiology

## 2019-07-07 NOTE — Telephone Encounter (Signed)
Patient's son in law called to advise Dr. Cristal Deer that they will be sending back the monitor in the same box that it was received in. He stated that there will not be someone to stay with her round the clock for the time that she will need to wear it, to ensure that it will work properly. He stated that the monitor is too complicated for the patient.

## 2019-07-07 NOTE — Telephone Encounter (Signed)
error 

## 2019-07-10 ENCOUNTER — Other Ambulatory Visit: Payer: Self-pay | Admitting: Cardiology

## 2019-07-19 ENCOUNTER — Telehealth: Payer: Self-pay | Admitting: Physical Medicine and Rehabilitation

## 2019-07-19 ENCOUNTER — Other Ambulatory Visit: Payer: Self-pay | Admitting: Physical Medicine and Rehabilitation

## 2019-07-19 MED ORDER — DIAZEPAM 5 MG PO TABS: ORAL_TABLET | ORAL | 0 refills | Status: DC

## 2019-07-19 NOTE — Telephone Encounter (Signed)
Patient called requesting a call back from Dr. Cumberland Blas nurse. Patient did not disclose nature of call. Only asked for call back at [phone].

## 2019-07-19 NOTE — Telephone Encounter (Signed)
Done

## 2019-07-19 NOTE — Progress Notes (Signed)
Pre-procedure diazepam ordered for pre-procedure anxiety.

## 2019-07-19 NOTE — Telephone Encounter (Signed)
Patient called to let us now that her MRI has been scheduled. I scheduled her for an OV following that. She would like to try valium prior to her MRI. [facility].

## 2019-08-07 ENCOUNTER — Ambulatory Visit
Admission: RE | Admit: 2019-08-07 | Discharge: 2019-08-07 | Disposition: A | Discharge status: Home / Self Care | Payer: Medicare PPO | Source: Ambulatory Visit | Attending: Physical Medicine and Rehabilitation | Admitting: Physical Medicine and Rehabilitation

## 2019-08-07 ENCOUNTER — Other Ambulatory Visit: Payer: Self-pay

## 2019-08-07 DIAGNOSIS — M5416 Radiculopathy, lumbar region: Secondary | ICD-10-CM

## 2019-08-07 DIAGNOSIS — M48061 Spinal stenosis, lumbar region without neurogenic claudication: Secondary | ICD-10-CM | POA: Diagnosis not present

## 2019-08-14 DIAGNOSIS — H353221 Exudative age-related macular degeneration, left eye, with active choroidal neovascularization: Secondary | ICD-10-CM | POA: Diagnosis not present

## 2019-08-15 ENCOUNTER — Ambulatory Visit: Payer: Medicare PPO | Admitting: Physical Medicine and Rehabilitation

## 2019-08-15 ENCOUNTER — Telehealth: Payer: Self-pay | Admitting: Physical Medicine and Rehabilitation

## 2019-08-15 ENCOUNTER — Other Ambulatory Visit: Payer: Self-pay

## 2019-08-15 DIAGNOSIS — M47816 Spondylosis without myelopathy or radiculopathy, lumbar region: Secondary | ICD-10-CM

## 2019-08-15 DIAGNOSIS — G8929 Other chronic pain: Secondary | ICD-10-CM | POA: Diagnosis not present

## 2019-08-15 DIAGNOSIS — M48061 Spinal stenosis, lumbar region without neurogenic claudication: Secondary | ICD-10-CM

## 2019-08-15 DIAGNOSIS — M5442 Lumbago with sciatica, left side: Secondary | ICD-10-CM | POA: Diagnosis not present

## 2019-08-15 DIAGNOSIS — M25552 Pain in left hip: Secondary | ICD-10-CM | POA: Diagnosis not present

## 2019-08-15 NOTE — Telephone Encounter (Signed)
Needs auth for [id number]. Scheduled for 8/26.

## 2019-08-15 NOTE — Progress Notes (Signed)
Left sided lower back pain for 2 years. No leg pain. Pain  Improves with rest/ sitting. Worse with walking, grocery shopping. Numeric Pain Rating Scale and Functional Assessment Average Pain 10   In the last MONTH (on 0-10 scale) has pain interfered with the following?  1. General activity like being  able to carry out your everyday physical activities such as walking, climbing stairs, carrying groceries, or moving a chair?  Rating(5)

## 2019-08-17 ENCOUNTER — Encounter: Payer: Medicare PPO | Admitting: Physical Medicine and Rehabilitation

## 2019-08-17 ENCOUNTER — Encounter: Payer: Self-pay | Admitting: Physical Medicine and Rehabilitation

## 2019-08-17 NOTE — Progress Notes (Signed)
Sharon Bailey - 86 y.o. female MRN [id number]  Date of birth: Apr 14, 1930  Office Visit Note: Visit Date: 08/15/2019 PCP: Clovis Riley, L.August Saucer, MD Referred by: Clovis Riley, L.August Saucer, MD  Subjective: Chief Complaint  Patient presents with  . Lower Back - Pain   HPI: Sharon Bailey is a 86 y.o. female who comes in today Evaluation and management of chronic worsening low back pain and left hip and leg pain.  Pain does refer into the left hip area not so much down the leg at this point although it has happened before.  She rates her pain as a 10 out of 10 but on every note we had she is rated her pain as a 10 out of 10.  She is also seen by Dr. Lavada Mesi in the office really for the same problem.  We saw her and made and those notes can be reviewed and we decided to order MRI of the lumbar spine at that point.  She had prior lumbar epidural injection a year ago with some relief that lasted.  She had not had any advanced imaging and this was ordered.  Unfortunately it took this long just to get her MRI completed.  In the interim she has been diagnosed with shingles which is also been managed by Dr. Prince Rome.  She reports left-sided lower back and hip pain which improves with rest and sitting worse with walking and standing.  She denies any focal weakness any bowel or bladder difficulty or new trauma since I have seen her.  MRI is reviewed with the patient today with spine models and the imaging.  Reviewed reviewed the notes with her as well.  As would be expected at her age she has multilevel changes.  Biggest findings would be foraminal narrowing on the left at L2-3 and then moderate multifactorial stenosis at L3-4 with left-sided subarticular stenosis.  She has more right-sided foraminal narrowing at L4-5 with central disc protrusion but no compression.  She has no high-grade central stenosis.  Review of Systems  Musculoskeletal: Positive for back pain and joint pain.  All other systems reviewed and are  negative.  Otherwise per HPI.  Assessment & Plan: Visit Diagnoses:  1. Chronic left-sided low back pain with left-sided sciatica   2. Spondylosis without myelopathy or radiculopathy, lumbar region   3. Bilateral stenosis of lateral recess of lumbar spine   4. Pain in left hip     Plan: Findings:  Chronic history of low back pain left more than right left hip and thigh pain more posterior lateral.  In the past epidural injection was efficacious for a few months.  No history of back surgery.  MRI was completed recently reviewed in the history of present illness.  We will complete a left L3 transforaminal injection diagnostically and hopefully therapeutically.  Depending on relief could look at facet joint block/medial branches.  Could look at epidural at a different level.  Should continue with home exercises and current medication.  She will continue follow-up with Dr. Prince Rome.    Meds & Orders: No orders of the defined types were placed in this encounter.  No orders of the defined types were placed in this encounter.   Follow-up: Return for Left L3 transforaminal epidural steroid injection.   Procedures: No procedures performed  No notes on file   Clinical History: Low back pain.  Left hip pain.  EXAM: MRI LUMBAR SPINE WITHOUT CONTRAST  TECHNIQUE: Multiplanar, multisequence MR imaging of the lumbar spine was  performed. No intravenous contrast was administered.  COMPARISON:  Lumbar radiographs 09/21/2017  FINDINGS: Segmentation:  Normal  Alignment: 5 mm retrolisthesis L2-3. 3 mm retrolisthesis L4-5. 5 mm anterolisthesis L4-5.  Moderate lumbar scoliosis.  Vertebrae: Negative for fracture or mass. Hemangioma T12 vertebral body.  Conus medullaris and cauda equina: Conus extends to the L1 level. Conus and cauda equina appear normal.  Paraspinal and other soft tissues: Negative for paraspinous mass or adenopathy.  Disc levels:  T12-L1: Negative  L1-2: Mild  disc and mild facet degeneration.  Negative for stenosis  L2-3: Disc degeneration which is asymmetric on the left. Severe disc space narrowing on the left with associated endplate spurring. Moderate facet hypertrophy bilaterally. Moderate to severe subarticular and foraminal stenosis on the left. Mild spinal stenosis.  L3-4: Asymmetric disc degeneration on the right with disc space narrowing and spurring. Moderate to severe subarticular foraminal stenosis on the right. Moderate facet hypertrophy. Moderate spinal stenosis. Mild subarticular stenosis on the left  L4-5: Broad-based central disc protrusion. Severe facet degeneration bilaterally. Moderate subarticular stenosis on the right. Mild subarticular stenosis on the left. Mild spinal stenosis  L5-S1: Bilateral facet hypertrophy. No disc protrusion or neural impingement.  IMPRESSION: Lumbar scoliosis and multilevel degenerative changes above  Mild spinal stenosis and moderate to severe subarticular foraminal stenosis on the left at L2-3 due to spurring  Moderate to severe subarticular foraminal stenosis on the right due to spurring at L3-4. Moderate spinal stenosis  Central disc protrusion with mild spinal stenosis L4-5. Moderate subarticular stenosis on the right.   Electronically Signed   By: Marlan Palau M.D.   On: 08/07/2019 16:06   She reports that she has never smoked. She has never used smokeless tobacco.  Recent Labs    06/12/19 0711  HGBA1C 5.7*    Objective:  VS:  HT:    WT:   BMI:     BP:   HR: bpm  TEMP: ( )  RESP:  Physical Exam Constitutional:      General: She is not in acute distress.    Appearance: Normal appearance. She is not ill-appearing.  HENT:     Head: Normocephalic and atraumatic.     Right Ear: External ear normal.     Left Ear: External ear normal.  Eyes:     Extraocular Movements: Extraocular movements intact.  Cardiovascular:     Rate and Rhythm: Normal rate.      Pulses: Normal pulses.  Musculoskeletal:     Right lower leg: No edema.     Left lower leg: No edema.     Comments: Patient has good distal strength.  He has some difficulty going into full extension she does have some back pain with facet loading.  She has no obvious trigger points on exam.  She is somewhat tender over the left greater trochanter.  No pain with hip rotation.  Good distal strength.  No clonus.  Skin:    Findings: No erythema, lesion or rash.  Neurological:     General: No focal deficit present.     Mental Status: She is alert and oriented to person, place, and time.     Sensory: No sensory deficit.     Motor: No weakness or abnormal muscle tone.     Coordination: Coordination normal.  Psychiatric:        Mood and Affect: Mood normal.        Behavior: Behavior normal.     Ortho Exam  Imaging: No results found.  Past Medical/Family/Surgical/Social History: Medications & Allergies reviewed per EMR, new medications updated. Patient Active Problem List   Diagnosis Date Noted  . Hypertensive emergency 06/12/2019  . Acute CVA (cerebrovascular accident) (HCC) 06/11/2019  . Nausea and vomiting 06/11/2019  . AKI (acute kidney injury) (HCC) 06/11/2019  . Metabolic acidosis 06/11/2019  . 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 Obstructive sleep apnea 10/31/2018  . Sensorineural hearing loss (SNHL) of both ears 10/31/2018  . Low back pain 09/21/2017  . Chronic fatigue 02/05/2017  . Atypical chest pain 02/05/2017  . Hyperlipidemia 02/05/2017  . Palpitations 04/18/2016  . HTN (hypertension) 04/18/2016  . Hypertensive heart disease without heart failure 04/18/2016  . Status post foot surgery 10/11/2012  . Gout attack 09/28/2012  . Hammer toe of left foot 09/28/2012  . Pain in joint, ankle and foot 09/28/2012   Past Medical History:  Diagnosis Date  . Abnormal EKG   . Collagenous colitis   . Collagenous colitis   . Diverticulitis   . Diverticulosis     . Fluttering heart   . GERD (gastroesophageal reflux disease)   . Gout   . Gout attack 09/28/2012  . Hammer toe of left foot 09/28/2012  . Hyperlipidemia   . Hypertension   . Hypertensive emergency   . Hypothyroidism   . Irregular heart beat   . Pain in joint, ankle and foot 09/28/2012  . Status post foot surgery 10/11/2012  . Thyroid disease    Family History  Problem Relation Age of Onset  . Breast cancer Mother   . Memory loss Father   . Heart disease Sister   . Rheumatic fever Sister   . Lung disease Brother   . Colon cancer Neg Hx    Past Surgical History:  Procedure Laterality Date  . ABDOMINAL HYSTERECTOMY    . ADENOIDECTOMY    . CHOLECYSTECTOMY    . Excision Ganglion Toe Left 09/29/2012   Lt #2 @ [facility]  . Hammer toe repair Left 09/29/2012   Lt #2 @ [facility]  . TONSILLECTOMY     Social History   Occupational History  . Not on file  Tobacco Use  . Smoking status: Never Smoker  . Smokeless tobacco: Never Used  Vaping Use  . Vaping Use: Never used  Substance and Sexual Activity  . Alcohol use: No    Alcohol/week: 0.0 standard drinks  . Drug use: No  . Sexual activity: Not on file

## 2019-08-18 ENCOUNTER — Other Ambulatory Visit: Payer: Self-pay

## 2019-08-18 ENCOUNTER — Encounter: Payer: Self-pay | Admitting: Podiatry

## 2019-08-18 ENCOUNTER — Ambulatory Visit: Payer: Medicare PPO | Admitting: Podiatry

## 2019-08-18 DIAGNOSIS — B351 Tinea unguium: Secondary | ICD-10-CM | POA: Diagnosis not present

## 2019-08-18 DIAGNOSIS — M79675 Pain in left toe(s): Secondary | ICD-10-CM

## 2019-08-18 DIAGNOSIS — M79674 Pain in right toe(s): Secondary | ICD-10-CM | POA: Diagnosis not present

## 2019-08-18 DIAGNOSIS — I739 Peripheral vascular disease, unspecified: Secondary | ICD-10-CM | POA: Diagnosis not present

## 2019-08-19 NOTE — Progress Notes (Signed)
Subjective: Sharon Bailey is a 86 y.o. female patient seen today painful mycotic nails b/l that are difficult to trim. Pain interferes with ambulation. Aggravating factors include wearing enclosed shoe gear. Pain is relieved with periodic professional debridement.  She also has h/o gout.  She voices no new pedal problems on today's visit.  Patient Active Problem List   Diagnosis Date Noted  . Hypertensive emergency 06/12/2019  . Acute CVA (cerebrovascular accident) (HCC) 06/11/2019  . Nausea and vomiting 06/11/2019  . AKI (acute kidney injury) (HCC) 06/11/2019  . Metabolic acidosis 06/11/2019  . 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 Obstructive sleep apnea 10/31/2018  . Sensorineural hearing loss (SNHL) of both ears 10/31/2018  . Low back pain 09/21/2017  . Chronic fatigue 02/05/2017  . Atypical chest pain 02/05/2017  . Hyperlipidemia 02/05/2017  . Palpitations 04/18/2016  . HTN (hypertension) 04/18/2016  . Hypertensive heart disease without heart failure 04/18/2016  . Status post foot surgery 10/11/2012  . Gout attack 09/28/2012  . Hammer toe of left foot 09/28/2012  . Pain in joint, ankle and foot 09/28/2012    Current Outpatient Medications on File Prior to Visit  Medication Sig Dispense Refill  . allopurinol (ZYLOPRIM) 300 MG tablet Take 300 mg by mouth daily.    Marland Kitchen amLODipine (NORVASC) 5 MG tablet TAKE 1 TABLET BY MOUTH EVERY DAY 90 tablet 0  . ascorbic acid (VITAMIN C) 1000 MG tablet Take 1 tablet (1,000 mg total) by mouth daily.    Marland Kitchen aspirin EC 81 MG EC tablet Take 1 tablet (81 mg total) by mouth daily.    . baclofen (LIORESAL) 10 MG tablet Take 0.5-1 tablets (5-10 mg total) by mouth at bedtime as needed for muscle spasms. 30 each 3  . budesonide (ENTOCORT EC) 3 MG 24 hr capsule Take 1 capsule (3 mg total) by mouth daily. 270 capsule 3  . carvedilol (COREG) 25 MG tablet Take 1 tablet (25 mg total) by mouth daily. 60 tablet 6  . diazepam (VALIUM) 5 MG tablet  Take 1 by mouth 1 hour  pre-procedure with very light food. May bring 2nd tablet to appointment. 2 tablet 0  . HYDROcodone-acetaminophen (NORCO/VICODIN) 5-325 MG tablet Take 1 tablet by mouth every 4 (four) hours as needed for moderate pain. 30 tablet 0  . levothyroxine (SYNTHROID) 88 MCG tablet Take 88 mcg by mouth daily before breakfast.    . lisinopril (ZESTRIL) 40 MG tablet Take 1 tablet (40 mg total) by mouth daily.    . ondansetron (ZOFRAN ODT) 4 MG disintegrating tablet Take 1 tablet (4 mg total) by mouth every 8 (eight) hours as needed for nausea or vomiting. 10 tablet 0  . simvastatin (ZOCOR) 40 MG tablet Take 40 mg by mouth daily.    . vitamin B-12 1000 MCG tablet Take 1 tablet (1,000 mcg total) by mouth daily.     No current facility-administered medications on file prior to visit.    Allergies  Allergen Reactions  . Sulfamethoxazole-Trimethoprim Diarrhea and Nausea Only    Objective: Physical Exam  General: Sharon Bailey is a pleasant 86 y.o. Caucasian female, in NAD. AAO x 3.   Vascular:  Neurovascular status unchanged b/l lower extremities. Capillary refill time to digits immediate b/l. Palpable pedal pulses b/l LE. Nonpalpable PT pulse(s) b/l lower extremities. Pedal hair absent. Lower extremity skin temperature gradient within normal limits. No ischemia or gangrene noted b/l lower extremities.  Dermatological:  Pedal skin is thin shiny, atrophic bilaterally. No  open wounds bilaterally. Toenails 1-5 b/l elongated, dystrophic, thickened, crumbly with subungual debris and tenderness to dorsal palpation.  Musculoskeletal:  Normal muscle strength 5/5 to all lower extremity muscle groups bilaterally. No pain crepitus or joint limitation noted with ROM b/l. Hammertoes noted to the L 2nd toe, L 3rd toe, L 4th toe, R 2nd toe, R 3rd toe and R 4th toe. Brachymetatarsia left 3rd digit.  Neurological:  Protective sensation intact 5/5 intact bilaterally with 10g monofilament b/l.  Proprioception intact bilaterally.  Assessment and Plan:  1. Pain due to onychomycosis of toenails of both feet   2. PAD (peripheral artery disease) (HCC)     -Examined patient. -No new findings. No new orders. -Toenails 1-5 b/l were debrided in length and girth with sterile nail nippers and dremel without iatrogenic bleeding.  -Patient to continue soft, supportive shoe gear daily. -Patient to report any pedal injuries to medical professional immediately. -Patient/POA to call should there be question/concern in the interim.  Return in about 3 months (around 11/18/2019) for nail trim.  Sharon Bailey, DPM

## 2019-08-25 NOTE — Telephone Encounter (Signed)
Pt Auth# has been approve, pt can be sch, Auth# [id number]

## 2019-08-31 ENCOUNTER — Other Ambulatory Visit: Payer: Self-pay

## 2019-08-31 ENCOUNTER — Ambulatory Visit: Payer: Medicare PPO | Admitting: Physical Medicine and Rehabilitation

## 2019-08-31 ENCOUNTER — Encounter: Payer: Self-pay | Admitting: Physical Medicine and Rehabilitation

## 2019-08-31 ENCOUNTER — Ambulatory Visit: Payer: Self-pay

## 2019-08-31 VITALS — BP 127/66 | HR 101

## 2019-08-31 DIAGNOSIS — M5416 Radiculopathy, lumbar region: Secondary | ICD-10-CM

## 2019-08-31 MED ORDER — METHYLPREDNISOLONE ACETATE 80 MG/ML IJ SUSP
40.0000 mg | Freq: Once | INTRAMUSCULAR | Status: AC
  Administered 2019-08-31: 40 mg

## 2019-08-31 NOTE — Progress Notes (Signed)
Sharon Bailey - 86 y.o. female MRN [id number]  Date of birth: 1930-03-28  Office Visit Note: Visit Date: 08/31/2019 PCP: Clovis Riley, L.August Saucer, MD Referred by: Clovis Riley, L.August Saucer, MD  Subjective: Chief Complaint  Patient presents with  . Lower Back - Pain   HPI:  Sharon Bailey is a 86 y.o. female who comes in today at the request of Dr. Naaman Plummer for planned Left L3-L4 Lumbar epidural steroid injection with fluoroscopic guidance.  The patient has failed conservative care including home exercise, medications, time and activity modification.  This injection will be diagnostic and hopefully therapeutic.  Please see requesting physician notes for further details and justification.  ROS Otherwise per HPI.  Assessment & Plan: Visit Diagnoses:  1. Lumbar radiculopathy     Plan: Findings:  Patient very anxious in general.  She had difficulty breathing on the table with the mask on as well.  She had real difficulty with me even needling through the muscle at all despite copious amounts of lidocaine.  We were able to complete the injection but with suggest in the future preprocedure Valium.    Meds & Orders:  Meds ordered this encounter  Medications  . methylPREDNISolone acetate (DEPO-MEDROL) injection 40 mg    Orders Placed This Encounter  Procedures  . XR C-ARM NO REPORT  . Epidural Steroid injection    Follow-up: Return if symptoms worsen or fail to improve.   Procedures: No procedures performed  Lumbosacral Transforaminal Epidural Steroid Injection - Sub-Pedicular Approach with Fluoroscopic Guidance  Patient: Sharon Bailey      Date of Birth: 06/13/1930 MRN: [id number] PCP: Clovis Riley, L.August Saucer, MD      Visit Date: 08/31/2019   Universal Protocol:    Date/Time: 08/31/2019  Consent Given By: the patient  Position: PRONE  Additional Comments: Vital signs were monitored before and after the procedure. Patient was prepped and draped in the usual sterile fashion. The correct  patient, procedure, and site was verified.   Injection Procedure Details:  Procedure Site One Meds Administered:  Meds ordered this encounter  Medications  . methylPREDNISolone acetate (DEPO-MEDROL) injection 40 mg    Laterality: Left  Location/Site:  L3-L4  Needle size: 22 G  Needle type: Spinal  Needle Placement: Transforaminal  Findings:    -Comments: Excellent flow of contrast along the nerve, nerve root and into the epidural space.  Patient did have a difficult time with needling through the musculature.  This is usually a fairly mild amount of discomfort even with numbing medicine but she really had this exaggerated spot every time we position the needle intramuscularly.  This was despite not even really being near the spine at that point.  Nonetheless she did continue with the procedure which went flawlessly otherwise.  Procedure Details: After squaring off the end-plates to get a true AP view, the C-arm was positioned so that an oblique view of the foramen as noted above was visualized. The target area is just inferior to the "nose of the scotty dog" or sub pedicular. The soft tissues overlying this structure were infiltrated with 2-3 ml. of 1% Lidocaine without Epinephrine.  The spinal needle was inserted toward the target using a "trajectory" view along the fluoroscope beam.  Under AP and lateral visualization, the needle was advanced so it did not puncture dura and was located close the 6 O'Clock position of the pedical in AP tracterory. Biplanar projections were used to confirm position. Aspiration was confirmed to be negative for CSF and/or blood. A  1-2 ml. volume of Isovue-250 was injected and flow of contrast was noted at each level. Radiographs were obtained for documentation purposes.   After attaining the desired flow of contrast documented above, a 0.5 to 1.0 ml test dose of 0.25% Marcaine was injected into each respective transforaminal space.  The patient was  observed for 90 seconds post injection.  After no sensory deficits were reported, and normal lower extremity motor function was noted,   the above injectate was administered so that equal amounts of the injectate were placed at each foramen (level) into the transforaminal epidural space.   Additional Comments:  The patient tolerated the procedure well Dressing: 2 x 2 sterile gauze and Band-Aid    Post-procedure details: Patient was observed during the procedure. Post-procedure instructions were reviewed.  Patient left the clinic in stable condition.      Clinical History: Low back pain.  Left hip pain.  EXAM: MRI LUMBAR SPINE WITHOUT CONTRAST  TECHNIQUE: Multiplanar, multisequence MR imaging of the lumbar spine was performed. No intravenous contrast was administered.  COMPARISON:  Lumbar radiographs 09/21/2017  FINDINGS: Segmentation:  Normal  Alignment: 5 mm retrolisthesis L2-3. 3 mm retrolisthesis L4-5. 5 mm anterolisthesis L4-5.  Moderate lumbar scoliosis.  Vertebrae: Negative for fracture or mass. Hemangioma T12 vertebral body.  Conus medullaris and cauda equina: Conus extends to the L1 level. Conus and cauda equina appear normal.  Paraspinal and other soft tissues: Negative for paraspinous mass or adenopathy.  Disc levels:  T12-L1: Negative  L1-2: Mild disc and mild facet degeneration.  Negative for stenosis  L2-3: Disc degeneration which is asymmetric on the left. Severe disc space narrowing on the left with associated endplate spurring. Moderate facet hypertrophy bilaterally. Moderate to severe subarticular and foraminal stenosis on the left. Mild spinal stenosis.  L3-4: Asymmetric disc degeneration on the right with disc space narrowing and spurring. Moderate to severe subarticular foraminal stenosis on the right. Moderate facet hypertrophy. Moderate spinal stenosis. Mild subarticular stenosis on the left  L4-5: Broad-based central  disc protrusion. Severe facet degeneration bilaterally. Moderate subarticular stenosis on the right. Mild subarticular stenosis on the left. Mild spinal stenosis  L5-S1: Bilateral facet hypertrophy. No disc protrusion or neural impingement.  IMPRESSION: Lumbar scoliosis and multilevel degenerative changes above  Mild spinal stenosis and moderate to severe subarticular foraminal stenosis on the left at L2-3 due to spurring  Moderate to severe subarticular foraminal stenosis on the right due to spurring at L3-4. Moderate spinal stenosis  Central disc protrusion with mild spinal stenosis L4-5. Moderate subarticular stenosis on the right.   Electronically Signed   By: Marlan Palau M.D.   On: 08/07/2019 16:06     Objective:  VS:  HT:    WT:   BMI:     BP:127/66  HR:(!) 101bpm  TEMP: ( )  RESP:  Physical Exam Constitutional:      General: She is not in acute distress.    Appearance: Normal appearance. She is not ill-appearing.  HENT:     Head: Normocephalic and atraumatic.     Right Ear: External ear normal.     Left Ear: External ear normal.  Eyes:     Extraocular Movements: Extraocular movements intact.  Cardiovascular:     Rate and Rhythm: Normal rate.     Pulses: Normal pulses.  Musculoskeletal:     Right lower leg: No edema.     Left lower leg: No edema.     Comments: Patient has good distal strength with  no pain over the greater trochanters.  No clonus or focal weakness.  Skin:    Findings: No erythema, lesion or rash.  Neurological:     General: No focal deficit present.     Mental Status: She is alert and oriented to person, place, and time.     Sensory: No sensory deficit.     Motor: No weakness or abnormal muscle tone.     Coordination: Coordination normal.  Psychiatric:        Mood and Affect: Mood normal.        Behavior: Behavior normal.      Imaging: XR C-ARM NO REPORT  Result Date: 08/31/2019 Please see Notes tab for imaging  impression.

## 2019-08-31 NOTE — Progress Notes (Signed)
Pt state left lower back. Pt state standing for a long period of time. Pt state sitting and rest helps with pain.  Numeric Pain Rating Scale and Functional Assessment Average Pain 5   In the last MONTH (on 0-10 scale) has pain interfered with the following?  1. General activity like being  able to carry out your everyday physical activities such as walking, climbing stairs, carrying groceries, or moving a chair?  Rating(10)   +Driver, -BT, -Dye Allergies.

## 2019-09-01 NOTE — Procedures (Signed)
Lumbosacral Transforaminal Epidural Steroid Injection - Sub-Pedicular Approach with Fluoroscopic Guidance  Patient: Sharon Bailey      Date of Birth: 1930-02-22 MRN: [id number] PCP: Clovis Riley, L.August Saucer, MD      Visit Date: 08/31/2019   Universal Protocol:    Date/Time: 08/31/2019  Consent Given By: the patient  Position: PRONE  Additional Comments: Vital signs were monitored before and after the procedure. Patient was prepped and draped in the usual sterile fashion. The correct patient, procedure, and site was verified.   Injection Procedure Details:  Procedure Site One Meds Administered:  Meds ordered this encounter  Medications  . methylPREDNISolone acetate (DEPO-MEDROL) injection 40 mg    Laterality: Left  Location/Site:  L3-L4  Needle size: 22 G  Needle type: Spinal  Needle Placement: Transforaminal  Findings:    -Comments: Excellent flow of contrast along the nerve, nerve root and into the epidural space.  Patient did have a difficult time with needling through the musculature.  This is usually a fairly mild amount of discomfort even with numbing medicine but she really had this exaggerated spot every time we position the needle intramuscularly.  This was despite not even really being near the spine at that point.  Nonetheless she did continue with the procedure which went flawlessly otherwise.  Procedure Details: After squaring off the end-plates to get a true AP view, the C-arm was positioned so that an oblique view of the foramen as noted above was visualized. The target area is just inferior to the "nose of the scotty dog" or sub pedicular. The soft tissues overlying this structure were infiltrated with 2-3 ml. of 1% Lidocaine without Epinephrine.  The spinal needle was inserted toward the target using a "trajectory" view along the fluoroscope beam.  Under AP and lateral visualization, the needle was advanced so it did not puncture dura and was located close the 6  O'Clock position of the pedical in AP tracterory. Biplanar projections were used to confirm position. Aspiration was confirmed to be negative for CSF and/or blood. A 1-2 ml. volume of Isovue-250 was injected and flow of contrast was noted at each level. Radiographs were obtained for documentation purposes.   After attaining the desired flow of contrast documented above, a 0.5 to 1.0 ml test dose of 0.25% Marcaine was injected into each respective transforaminal space.  The patient was observed for 90 seconds post injection.  After no sensory deficits were reported, and normal lower extremity motor function was noted,   the above injectate was administered so that equal amounts of the injectate were placed at each foramen (level) into the transforaminal epidural space.   Additional Comments:  The patient tolerated the procedure well Dressing: 2 x 2 sterile gauze and Band-Aid    Post-procedure details: Patient was observed during the procedure. Post-procedure instructions were reviewed.  Patient left the clinic in stable condition.

## 2019-09-06 ENCOUNTER — Telehealth (INDEPENDENT_AMBULATORY_CARE_PROVIDER_SITE_OTHER): Payer: Medicare PPO | Admitting: Cardiology

## 2019-09-06 ENCOUNTER — Encounter: Payer: Self-pay | Admitting: Cardiology

## 2019-09-06 VITALS — BP 147/70 | HR 80 | Ht 64.0 in | Wt 154.0 lb

## 2019-09-06 DIAGNOSIS — Z79899 Other long term (current) drug therapy: Secondary | ICD-10-CM

## 2019-09-06 DIAGNOSIS — R5382 Chronic fatigue, unspecified: Secondary | ICD-10-CM

## 2019-09-06 DIAGNOSIS — Z7189 Other specified counseling: Secondary | ICD-10-CM

## 2019-09-06 DIAGNOSIS — I10 Essential (primary) hypertension: Secondary | ICD-10-CM | POA: Diagnosis not present

## 2019-09-06 DIAGNOSIS — Z8673 Personal history of transient ischemic attack (TIA), and cerebral infarction without residual deficits: Secondary | ICD-10-CM | POA: Diagnosis not present

## 2019-09-06 NOTE — Patient Instructions (Signed)

## 2019-09-06 NOTE — Progress Notes (Signed)
Virtual Visit via Telephone Note   This visit type was conducted due to national recommendations for restrictions regarding the COVID-19 Pandemic (e.g. social distancing) in an effort to limit this patient's exposure and mitigate transmission in our community.  Due to her co-morbid illnesses, this patient is at least at moderate risk for complications without adequate follow up.  This format is felt to be most appropriate for this patient at this time.  The patient did not have access to video technology/had technical difficulties with video requiring transitioning to audio format only (telephone).  All issues noted in this document were discussed and addressed.  No physical exam could be performed with this format.  Please refer to the patient's chart for her  consent to telehealth for [facility].   Patient identified using 2 identifiers.  Date:  09/06/2019   ID:  Sharon Bailey, DOB 04-21-1930, MRN [id number]  Patient Location: Home Provider Location: Office/Clinic  PCP:  Asencion Gowda.August Saucer, MD  Cardiologist:  Jodelle Red, MD  Electrophysiologist:  None   Evaluation Performed:  Follow-Up Visit  Chief Complaint:  Follow up  History of Present Illness:    Sharon Bailey is a 86 y.o. female with a hx of hypertension, hyperlipidemia, history of CVA and gout.  The patient does not have symptoms concerning for COVID-19 infection (fever, chills, cough, or new shortness of breath).   Today: No more high BP since being hospitalized in June. Had low BP over the weekend, 90s/60s. This is rare, no clear triggers. Eating and drinking well. Feels sluggish when this occurs.  Reviewed hospitalization, discharged 06/13/19, for acute CVA.  Reviewed medications today. She thinks the carvedilol was stopped in the hospital (though it is on her discharge medication list). Currently taking lisinopril and amlodipine.  Drinks a lot of fluid, has chronic urination at night due to this.  Had  shingles in June after her hospitalization.  Denies chest pain, shortness of breath at rest or with normal exertion. No PND, orthopnea, LE edema or unexpected weight gain. No syncope or palpitations.   Past Medical History:  Diagnosis Date  . Abnormal EKG   . Collagenous colitis   . Collagenous colitis   . Diverticulitis   . Diverticulosis   . Fluttering heart   . GERD (gastroesophageal reflux disease)   . Gout   . Gout attack 09/28/2012  . Hammer toe of left foot 09/28/2012  . Hyperlipidemia   . Hypertension   . Hypertensive emergency   . Hypothyroidism   . Irregular heart beat   . Pain in joint, ankle and foot 09/28/2012  . Status post foot surgery 10/11/2012  . Thyroid disease    Past Surgical History:  Procedure Laterality Date  . ABDOMINAL HYSTERECTOMY    . ADENOIDECTOMY    . CHOLECYSTECTOMY    . Excision Ganglion Toe Left 09/29/2012   Lt #2 @ [facility]  . Hammer toe repair Left 09/29/2012   Lt #2 @ [facility]  . TONSILLECTOMY       Current Meds  Medication Sig  . allopurinol (ZYLOPRIM) 300 MG tablet Take 300 mg by mouth daily.  Marland Kitchen amLODipine (NORVASC) 5 MG tablet TAKE 1 TABLET BY MOUTH EVERY DAY  . ascorbic acid (VITAMIN C) 1000 MG tablet Take 1 tablet (1,000 mg total) by mouth daily.  Marland Kitchen aspirin EC 81 MG EC tablet Take 1 tablet (81 mg total) by mouth daily.  . baclofen (LIORESAL) 10 MG tablet Take 0.5-1 tablets (5-10 mg total) by mouth  at bedtime as needed for muscle spasms.  . budesonide (ENTOCORT EC) 3 MG 24 hr capsule Take 1 capsule (3 mg total) by mouth daily.  Marland Kitchen HYDROcodone-acetaminophen (NORCO/VICODIN) 5-325 MG tablet Take 1 tablet by mouth every 4 (four) hours as needed for moderate pain.  Marland Kitchen levothyroxine (SYNTHROID) 88 MCG tablet Take 88 mcg by mouth daily before breakfast.  . lisinopril (ZESTRIL) 40 MG tablet Take 1 tablet (40 mg total) by mouth daily.  . ondansetron (ZOFRAN ODT) 4 MG disintegrating tablet Take 1 tablet (4 mg total) by mouth every 8 (eight) hours as needed  for nausea or vomiting.  . simvastatin (ZOCOR) 40 MG tablet Take 40 mg by mouth daily.  . vitamin B-12 1000 MCG tablet Take 1 tablet (1,000 mcg total) by mouth daily.     Allergies:   Sulfamethoxazole-trimethoprim   Social History   Tobacco Use  . Smoking status: Never Smoker  . Smokeless tobacco: Never Used  Vaping Use  . Vaping Use: Never used  Substance Use Topics  . Alcohol use: No    Alcohol/week: 0.0 standard drinks  . Drug use: No     Family Hx: The patient's family history includes Breast cancer in her mother; Heart disease in her sister; Lung disease in her brother; Memory loss in her father; Rheumatic fever in her sister. There is no history of Colon cancer.  ROS:   Please see the history of present illness.    All other systems reviewed and are negative.   Prior CV studies:   The following studies were reviewed today: No new since last visit  Labs/Other Tests and Data Reviewed:    EKG:  An ECG dated 03/09/18 was personally reviewed today and demonstrated:  NSR, IVCD in left bundle pattern  Recent Labs: 07/05/2019: ALT 28; BUN 20; Creatinine, Ser 1.20; Hemoglobin 12.2; Platelets 244; Potassium 4.4; Sodium 140   Recent Lipid Panel Lab Results  Component Value Date/Time   CHOL 140 06/12/2019 07:11 AM   TRIG 105 06/12/2019 07:11 AM   HDL 47 06/12/2019 07:11 AM   CHOLHDL 3.0 06/12/2019 07:11 AM   LDLCALC 72 06/12/2019 07:11 AM    Wt Readings from Last 3 Encounters:  09/06/19 154 lb (69.9 kg)  07/05/19 152 lb (68.9 kg)  06/11/19 162 lb 0.6 oz (73.5 kg)     Objective:    Vital Signs:  BP (!) 147/70   Pulse 80   Ht 5\' 4"  (1.626 m)   Wt 154 lb (69.9 kg)   BMI 26.43 kg/m    Speaking comfortably on the phone, no audible wheezing In no acute distress Alert and oriented Normal affect Normal speech  ASSESSMENT & PLAN:    Hypertension: reasonable goal, especially with intermittent low blood pressures -continue amlodipine 5 mg daily  -has had  carvedilol stopped, unclear when/why, but will not restart at this time -continue lisinopril 40 mg daily -has said she does not want diuretic/anything that makes her urinate more  Chronic fatigue: -feels like overall she is doing well, except when her blood pressure is low -has had echo, monitor, and stress test in last two years (while having symptoms) that have not show cardiac etiology  Cardiac risk counseling and primary prevention recommendations: Recent CVA as ASCVD -see prior discussion re: statins. Has tolerated simvastatin, cannot raise dose due to amlodipine interaction -counseled on red flag warning signs -last lipids 06/12/19, LDL 72. Ideal goal <70 -on aspirin 81 mg daily  Plan for follow up: 3 mos to  check BP cuff, do full med rec  COVID-19 Education: The signs and symptoms of COVID-19 were discussed with the patient and how to seek care for testing (follow up with PCP or arrange E-visit).  The importance of social distancing was discussed today.  Time:   Today, I have spent 9 minutes with the patient with telehealth technology discussing the above problems.    Patient Instructions  Medication Instructions:  Your Physician recommend you continue on your current medication as directed.    *If you need a refill on your cardiac medications before your next appointment, please call your pharmacy*   Lab Work: None  Testing/Procedures: None   Follow-Up: At [facility], you and your health needs are our priority.  As part of our continuing mission to provide you with exceptional heart care, we have created designated Provider Care Teams.  These Care Teams include your primary Cardiologist (physician) and Advanced Practice Providers (APPs -  Physician Assistants and Nurse Practitioners) who all work together to provide you with the care you need, when you need it.  We recommend signing up for the patient portal called "MyChart".  Sign up information is provided on  this After Visit Summary.  MyChart is used to connect with patients for Virtual Visits (Telemedicine).  Patients are able to view lab/test results, encounter notes, upcoming appointments, etc.  Non-urgent messages can be sent to your provider as well.   To learn more about what you can do with MyChart, go to [email or web address].    Your next appointment:   3 month(s)  The format for your next appointment:   In Person  Provider:   Jodelle Red, MD      Signed, Jodelle Red, MD  09/06/2019 5:08 PM    [facility]

## 2019-09-14 ENCOUNTER — Telehealth: Payer: Self-pay

## 2019-09-14 NOTE — Telephone Encounter (Signed)
Patient called she wants to speak to [facility]. Patient didn't go into detail regarding call she would like Courtney to call back at [phone]

## 2019-09-14 NOTE — Telephone Encounter (Signed)
Patient had a left L3 TF on 8/26. She states that she is doing fine. She feels like the injection "helped a lot" and that she is able to be more active and to stand for longer.

## 2019-09-15 ENCOUNTER — Other Ambulatory Visit: Payer: Self-pay | Admitting: Physical Medicine and Rehabilitation

## 2019-09-15 NOTE — Telephone Encounter (Signed)
awesome

## 2019-09-15 NOTE — Telephone Encounter (Signed)
Please advise 

## 2019-09-24 ENCOUNTER — Other Ambulatory Visit: Payer: Self-pay | Admitting: Family Medicine

## 2019-10-09 DIAGNOSIS — H353221 Exudative age-related macular degeneration, left eye, with active choroidal neovascularization: Secondary | ICD-10-CM | POA: Diagnosis not present

## 2019-10-14 DIAGNOSIS — Z23 Encounter for immunization: Secondary | ICD-10-CM | POA: Diagnosis not present

## 2019-10-22 ENCOUNTER — Emergency Department (HOSPITAL_COMMUNITY)
Admission: EM | Admit: 2019-10-22 | Discharge: 2019-10-23 | Disposition: A | Discharge status: Home / Self Care | Payer: Medicare PPO | Attending: Emergency Medicine | Admitting: Emergency Medicine

## 2019-10-22 ENCOUNTER — Encounter (HOSPITAL_COMMUNITY): Payer: Self-pay

## 2019-10-22 DIAGNOSIS — R42 Dizziness and giddiness: Secondary | ICD-10-CM | POA: Insufficient documentation

## 2019-10-22 DIAGNOSIS — E039 Hypothyroidism, unspecified: Secondary | ICD-10-CM | POA: Insufficient documentation

## 2019-10-22 DIAGNOSIS — I10 Essential (primary) hypertension: Secondary | ICD-10-CM

## 2019-10-22 DIAGNOSIS — N3000 Acute cystitis without hematuria: Secondary | ICD-10-CM | POA: Diagnosis not present

## 2019-10-22 DIAGNOSIS — Z79899 Other long term (current) drug therapy: Secondary | ICD-10-CM | POA: Diagnosis not present

## 2019-10-22 DIAGNOSIS — Z7982 Long term (current) use of aspirin: Secondary | ICD-10-CM | POA: Insufficient documentation

## 2019-10-22 DIAGNOSIS — R000 Tachycardia, unspecified: Secondary | ICD-10-CM | POA: Diagnosis not present

## 2019-10-22 DIAGNOSIS — G9389 Other specified disorders of brain: Secondary | ICD-10-CM | POA: Diagnosis not present

## 2019-10-22 DIAGNOSIS — R110 Nausea: Secondary | ICD-10-CM | POA: Diagnosis not present

## 2019-10-22 DIAGNOSIS — I6782 Cerebral ischemia: Secondary | ICD-10-CM | POA: Diagnosis not present

## 2019-10-22 DIAGNOSIS — R1111 Vomiting without nausea: Secondary | ICD-10-CM | POA: Diagnosis not present

## 2019-10-22 NOTE — ED Triage Notes (Signed)
Pt comes via [facility] for n/v and some dizziness, room spinning. PTA received 4mg  of zofran and one nitro. Pt was not having CP, neuro intact bilaterally.

## 2019-10-23 ENCOUNTER — Emergency Department (HOSPITAL_COMMUNITY): Payer: Medicare PPO

## 2019-10-23 DIAGNOSIS — I10 Essential (primary) hypertension: Secondary | ICD-10-CM | POA: Diagnosis not present

## 2019-10-23 DIAGNOSIS — I6782 Cerebral ischemia: Secondary | ICD-10-CM | POA: Diagnosis not present

## 2019-10-23 DIAGNOSIS — G9389 Other specified disorders of brain: Secondary | ICD-10-CM | POA: Diagnosis not present

## 2019-10-23 DIAGNOSIS — R42 Dizziness and giddiness: Secondary | ICD-10-CM | POA: Diagnosis not present

## 2019-10-23 LAB — BASIC METABOLIC PANEL
Anion gap: 11 (ref 5–15)
BUN: 28 mg/dL — ABNORMAL HIGH (ref 8–23)
CO2: 22 mmol/L (ref 22–32)
Calcium: 9.5 mg/dL (ref 8.9–10.3)
Chloride: 109 mmol/L (ref 98–111)
Creatinine, Ser: 1.52 mg/dL — ABNORMAL HIGH (ref 0.44–1.00)
GFR, Estimated: 31 mL/min — ABNORMAL LOW (ref >60)
Glucose, Bld: 102 mg/dL — ABNORMAL HIGH (ref 70–99)
Potassium: 4.2 mmol/L (ref 3.5–5.1)
Sodium: 142 mmol/L (ref 135–145)

## 2019-10-23 LAB — URINALYSIS, ROUTINE W REFLEX MICROSCOPIC
Bilirubin Urine: NEGATIVE
Glucose, UA: NEGATIVE mg/dL
Hgb urine dipstick: NEGATIVE
Ketones, ur: NEGATIVE mg/dL
Nitrite: NEGATIVE
Protein, ur: 30 mg/dL — AB
Specific Gravity, Urine: 1.017 (ref 1.005–1.030)
WBC, UA: >50 WBC/hpf — ABNORMAL HIGH (ref 0–5)
pH: 5 (ref 5.0–8.0)

## 2019-10-23 LAB — CBC
HCT: 38.4 % (ref 36.0–46.0)
Hemoglobin: 11.6 g/dL — ABNORMAL LOW (ref 12.0–15.0)
MCH: 26.9 pg (ref 26.0–34.0)
MCHC: 30.2 g/dL (ref 30.0–36.0)
MCV: 88.9 fL (ref 80.0–100.0)
Platelets: 193 10*3/uL (ref 150–400)
RBC: 4.32 MIL/uL (ref 3.87–5.11)
RDW: 15.1 % (ref 11.5–15.5)
WBC: 7.7 10*3/uL (ref 4.0–10.5)
nRBC: 0 % (ref 0.0–0.2)

## 2019-10-23 MED ORDER — LISINOPRIL 20 MG PO TABS
40.0000 mg | ORAL_TABLET | Freq: Once | ORAL | Status: AC
  Administered 2019-10-23: 40 mg via ORAL
  Filled 2019-10-23: qty 2

## 2019-10-23 MED ORDER — AMLODIPINE BESYLATE 5 MG PO TABS
5.0000 mg | ORAL_TABLET | Freq: Once | ORAL | Status: AC
  Administered 2019-10-23: 5 mg via ORAL
  Filled 2019-10-23: qty 1

## 2019-10-23 MED ORDER — MECLIZINE HCL 12.5 MG PO TABS: 12.5000 mg | ORAL_TABLET | Freq: Three times a day (TID) | ORAL | 0 refills | Status: DC | PRN

## 2019-10-23 MED ORDER — CEPHALEXIN 500 MG PO CAPS: 500.0000 mg | ORAL_CAPSULE | Freq: Three times a day (TID) | ORAL | 0 refills | Status: AC

## 2019-10-23 NOTE — ED Notes (Signed)
Pt reports sudden onset of blurry vision and dizziness at approximately 21:30. At home blood pressure cuff read "high". Pt dizziness and blurry vision resolved in waiting room. Pt reports similar Incident on June 6th with sudden onset of dizziness

## 2019-10-23 NOTE — ED Notes (Signed)
Patient transported to CT 

## 2019-10-23 NOTE — ED Provider Notes (Signed)
[facility] EMERGENCY DEPARTMENT Provider Note  CSN: [id number] Arrival date & time: 10/22/19 2339    History Chief Complaint  Patient presents with  . Dizziness    HPI  Sharon Bailey is a 86 y.o. female with history of HTN reports yesterday evening around 9pm she got up from sitting and felt dizzy, like the room was spinning and off balance with walking. She was nauseated and vomited once. She reports she checked her BP and it was also elevated and so after talking to her family, EMS was called and she was brought to the ED. While waiting for an ED exam room to be available, she reports her dizziness has resolved. She denies any CP, SOB, Numbness, tingling or weakness. She admitted for a similar episode in June and ultimately had an MRI which was positive for a small stroke in her frontal lobe, but she was told this was not the cause of her dizziness. No change in hearing or headaches.    Past Medical History:  Diagnosis Date  . Abnormal EKG   . Collagenous colitis   . Collagenous colitis   . Diverticulitis   . Diverticulosis   . Fluttering heart   . GERD (gastroesophageal reflux disease)   . Gout   . Gout attack 09/28/2012  . Hammer toe of left foot 09/28/2012  . Hyperlipidemia   . Hypertension   . Hypertensive emergency   . Hypothyroidism   . Irregular heart beat   . Pain in joint, ankle and foot 09/28/2012  . Status post foot surgery 10/11/2012  . Thyroid disease     Past Surgical History:  Procedure Laterality Date  . ABDOMINAL HYSTERECTOMY    . ADENOIDECTOMY    . CHOLECYSTECTOMY    . Excision Ganglion Toe Left 09/29/2012   Lt #2 @ [facility]  . Hammer toe repair Left 09/29/2012   Lt #2 @ [facility]  . TONSILLECTOMY      Family History  Problem Relation Age of Onset  . Breast cancer Mother   . Memory loss Father   . Heart disease Sister   . Rheumatic fever Sister   . Lung disease Brother   . Colon cancer Neg Hx     Social History   Tobacco Use  . Smoking status:  Never Smoker  . Smokeless tobacco: Never Used  Vaping Use  . Vaping Use: Never used  Substance Use Topics  . Alcohol use: No    Alcohol/week: 0.0 standard drinks  . Drug use: No     Home Medications Prior to Admission medications   Medication Sig Start Date End Date Taking? Authorizing Provider  allopurinol (ZYLOPRIM) 300 MG tablet Take 300 mg by mouth daily.    [provider]  amLODipine (NORVASC) 5 MG tablet TAKE 1 TABLET BY MOUTH EVERY DAY 07/11/19   Jodelle Red, MD  ascorbic acid (VITAMIN C) 1000 MG tablet Take 1 tablet (1,000 mg total) by mouth daily. 06/13/19   Joseph Art, DO  aspirin EC 81 MG EC tablet Take 1 tablet (81 mg total) by mouth daily. 06/13/19   Joseph Art, DO  baclofen (LIORESAL) 10 MG tablet TAKE 0.5-1 TABLETS (5-10 MG TOTAL) BY MOUTH AT BEDTIME AS NEEDED FOR MUSCLE SPASMS. 09/24/19   Hilts, Casimiro Needle, MD  budesonide (ENTOCORT EC) 3 MG 24 hr capsule Take 1 capsule (3 mg total) by mouth daily. 02/22/19   Hilarie Fredrickson, MD  cephALEXin (KEFLEX) 500 MG capsule Take 1 capsule (500 mg  total) by mouth 3 (three) times daily for 7 days. 10/23/19 10/30/19  Pollyann Savoy, MD  HYDROcodone-acetaminophen (NORCO/VICODIN) 5-325 MG tablet Take 1 tablet by mouth every 4 (four) hours as needed for moderate pain. 07/06/19   Hilts, Casimiro Needle, MD  levothyroxine (SYNTHROID) 88 MCG tablet Take 88 mcg by mouth daily before breakfast.    [provider]  lisinopril (ZESTRIL) 40 MG tablet Take 1 tablet (40 mg total) by mouth daily. 06/16/19   Joseph Art, DO  meclizine (ANTIVERT) 12.5 MG tablet Take 1 tablet (12.5 mg total) by mouth 3 (three) times daily as needed for dizziness. 10/23/19   Pollyann Savoy, MD  ondansetron (ZOFRAN ODT) 4 MG disintegrating tablet Take 1 tablet (4 mg total) by mouth every 8 (eight) hours as needed for nausea or vomiting. 07/05/19   Tilden Fossa, MD  simvastatin (ZOCOR) 40 MG tablet Take 40 mg by mouth daily.    [provider]  vitamin B-12 1000 MCG tablet Take 1 tablet (1,000 mcg total) by mouth daily. 06/14/19   Joseph Art, DO  baclofen (LIORESAL) 10 MG tablet Take 0.5-1 tablets (5-10 mg total) by mouth at bedtime as needed for muscle spasms. 07/03/19   Hilts, Michael, MD     Allergies    Sulfamethoxazole-trimethoprim   Review of Systems   Review of Systems A comprehensive review of systems was completed and negative except as noted in HPI.    Physical Exam BP (!) 169/67   Pulse 83   Temp 98 F (36.7 C) (Oral)   Resp 15   Ht 5\' 4"  (1.626 m)   Wt 70.3 kg   SpO2 96%   BMI 26.61 kg/m   Physical Exam Vitals and nursing note reviewed.  Constitutional:      Appearance: Normal appearance.  HENT:     Head: Normocephalic and atraumatic.     Nose: Nose normal.     Mouth/Throat:     Mouth: Mucous membranes are moist.  Eyes:     Extraocular Movements: Extraocular movements intact.     Conjunctiva/sclera: Conjunctivae normal.  Cardiovascular:     Rate and Rhythm: Normal rate.  Pulmonary:     Effort: Pulmonary effort is normal.     Breath sounds: Normal breath sounds.  Abdominal:     General: Abdomen is flat.     Palpations: Abdomen is soft.     Tenderness: There is no abdominal tenderness.  Musculoskeletal:        General: No swelling. Normal range of motion.     Cervical back: Neck supple.  Skin:    General: Skin is warm and dry.  Neurological:     General: No focal deficit present.     Mental Status: She is alert and oriented to person, place, and time.     Cranial Nerves: No cranial nerve deficit.     Sensory: No sensory deficit.     Motor: No weakness.     Gait: Gait normal.  Psychiatric:        Mood and Affect: Mood normal.      ED Results / Procedures / Treatments   Labs (all labs ordered are listed, but only abnormal results are displayed) Labs Reviewed  BASIC METABOLIC PANEL - Abnormal; Notable for the following components:      Result Value   Glucose,  Bld 102 (*)    BUN 28 (*)    Creatinine, Ser 1.52 (*)    GFR, Estimated 31 (*)  All other components within normal limits  CBC - Abnormal; Notable for the following components:   Hemoglobin 11.6 (*)    All other components within normal limits  URINALYSIS, ROUTINE W REFLEX MICROSCOPIC - Abnormal; Notable for the following components:   APPearance HAZY (*)    Protein, ur 30 (*)    Leukocytes,Ua LARGE (*)    WBC, UA >50 (*)    Bacteria, UA MANY (*)    All other components within normal limits  URINE CULTURE  CBG MONITORING, ED    EKG EKG Interpretation  Date/Time:  Sunday October 22 2019 23:49:06 EDT Ventricular Rate:  81 PR Interval:  132 QRS Duration: 104 QT Interval:  382 QTC Calculation: 443 R Axis:   -55 Text Interpretation: Normal sinus rhythm Left axis deviation Moderate voltage criteria for LVH, may be normal variant ( R in aVL , Cornell product ) Septal infarct , age undetermined Abnormal ECG When compared with ECG of 07/05/2019, No significant change was found Confirmed by Dione BoozeGlick, David ([id number]) on 10/23/2019 12:39:56 AM   Radiology CT Head Wo Contrast  Result Date: 10/23/2019 CLINICAL DATA:  Dizziness, hypertension EXAM: CT HEAD WITHOUT CONTRAST TECHNIQUE: Contiguous axial images were obtained from the base of the skull through the vertex without intravenous contrast. COMPARISON:  07/05/2019 FINDINGS: Brain: No evidence of acute infarction, hemorrhage, hydrocephalus, extra-axial collection or mass lesion/mass effect. Scattered low-density changes within the periventricular and subcortical white matter compatible with chronic microvascular ischemic change. Mild diffuse cerebral volume loss. Vascular: Atherosclerotic calcifications involving the large vessels of the skull base. No unexpected hyperdense vessel. Skull: Normal. Negative for fracture or focal lesion. Sinuses/Orbits: No acute finding. Other: None. IMPRESSION: 1. No acute intracranial findings. 2. Chronic  microvascular ischemic change and cerebral volume loss. Electronically Signed   By: Duanne GuessNicholas  Plundo D.O.   On: 10/23/2019 10:08    Procedures Procedures  Medications Ordered in the ED Medications  amLODipine (NORVASC) tablet 5 mg (5 mg Oral Given 10/23/19 0900)  lisinopril (ZESTRIL) tablet 40 mg (40 mg Oral Given 10/23/19 0901)     MDM Rules/Calculators/A&P MDM Patient here with vertiginous dizziness has since resolved and HTN which is still present, although she has not had her morning medications. She will be given her morning meds, check a CT due to neuro symptoms in HTN and recent stroke. UA pending. Otherwise labs done in triage are unremarkable.  ED Course  I have reviewed the triage vital signs and the nursing notes.  Pertinent labs & imaging results that were available during my care of the patient were reviewed by me and considered in my medical decision making (see chart for details).  Clinical Course as of Oct 23 1323  Mon Oct 23, 2019  [id number] UA concerning for infection. She does not have dysuria but has had some polyuria she attributed to drinking lots of water, regardless her dizziness may be associated. She has a normal head CT, BP is improving with home meds. Plan discharge home with Keflex for her UTI, meclizine for vertigo and close PCP follow up. Given return precautions if symptoms worsen.    [CS]    Clinical Course User Index [CS] Pollyann SavoySheldon, Eleonor Ocon B, MD    Final Clinical Impression(s) / ED Diagnoses Final diagnoses:  Vertigo  Acute cystitis without hematuria  Hypertension, unspecified type    Rx / DC Orders ED Discharge Orders         Ordered    cephALEXin (KEFLEX) 500 MG capsule  3 times daily  10/23/19 1028    meclizine (ANTIVERT) 12.5 MG tablet  3 times daily PRN        10/23/19 1028           Pollyann Savoy, MD 10/23/19 1325

## 2019-10-25 LAB — URINE CULTURE: Culture: >=100000 — AB

## 2019-11-24 ENCOUNTER — Other Ambulatory Visit: Payer: Self-pay

## 2019-11-24 ENCOUNTER — Encounter: Payer: Self-pay | Admitting: Podiatry

## 2019-11-24 ENCOUNTER — Ambulatory Visit: Payer: Medicare PPO | Admitting: Podiatry

## 2019-11-24 DIAGNOSIS — M79674 Pain in right toe(s): Secondary | ICD-10-CM

## 2019-11-24 DIAGNOSIS — M79675 Pain in left toe(s): Secondary | ICD-10-CM | POA: Diagnosis not present

## 2019-11-24 DIAGNOSIS — B351 Tinea unguium: Secondary | ICD-10-CM

## 2019-11-26 NOTE — Progress Notes (Signed)
Subjective: Sharon Bailey is a 86 y.o. female patient seen today painful mycotic nails b/l that are difficult to trim. Pain interferes with ambulation. Aggravating factors include wearing enclosed shoe gear. Pain is relieved with periodic professional debridement.  She also has h/o gout.  She voices no new pedal problems on today's visit.  Patient Active Problem List   Diagnosis Date Noted  . History of CVA (cerebrovascular accident) 09/06/2019  . Hypertensive emergency 06/12/2019  . Acute CVA (cerebrovascular accident) (HCC) 06/11/2019  . Nausea and vomiting 06/11/2019  . AKI (acute kidney injury) (HCC) 06/11/2019  . Metabolic acidosis 06/11/2019  . 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 Obstructive sleep apnea 10/31/2018  . Sensorineural hearing loss (SNHL) of both ears 10/31/2018  . Low back pain 09/21/2017  . Chronic fatigue 02/05/2017  . Atypical chest pain 02/05/2017  . Hyperlipidemia 02/05/2017  . Palpitations 04/18/2016  . Essential hypertension 04/18/2016  . Hypertensive heart disease without heart failure 04/18/2016  . Status post foot surgery 10/11/2012  . Gout attack 09/28/2012  . Hammer toe of left foot 09/28/2012  . Pain in joint, ankle and foot 09/28/2012    Current Outpatient Medications on File Prior to Visit  Medication Sig Dispense Refill  . allopurinol (ZYLOPRIM) 300 MG tablet Take 300 mg by mouth daily.    Marland Kitchen amLODipine (NORVASC) 5 MG tablet TAKE 1 TABLET BY MOUTH EVERY DAY 90 tablet 0  . ascorbic acid (VITAMIN C) 1000 MG tablet Take 1 tablet (1,000 mg total) by mouth daily.    Marland Kitchen aspirin EC 81 MG EC tablet Take 1 tablet (81 mg total) by mouth daily.    . baclofen (LIORESAL) 10 MG tablet TAKE 0.5-1 TABLETS (5-10 MG TOTAL) BY MOUTH AT BEDTIME AS NEEDED FOR MUSCLE SPASMS. 90 tablet 1  . budesonide (ENTOCORT EC) 3 MG 24 hr capsule Take 1 capsule (3 mg total) by mouth daily. 270 capsule 3  . HYDROcodone-acetaminophen (NORCO/VICODIN) 5-325 MG tablet  Take 1 tablet by mouth every 4 (four) hours as needed for moderate pain. 30 tablet 0  . levothyroxine (SYNTHROID) 88 MCG tablet Take 88 mcg by mouth daily before breakfast.    . lisinopril (ZESTRIL) 40 MG tablet Take 1 tablet (40 mg total) by mouth daily.    . meclizine (ANTIVERT) 12.5 MG tablet Take 1 tablet (12.5 mg total) by mouth 3 (three) times daily as needed for dizziness. 30 tablet 0  . ondansetron (ZOFRAN ODT) 4 MG disintegrating tablet Take 1 tablet (4 mg total) by mouth every 8 (eight) hours as needed for nausea or vomiting. 10 tablet 0  . simvastatin (ZOCOR) 40 MG tablet Take 40 mg by mouth daily.    . vitamin B-12 1000 MCG tablet Take 1 tablet (1,000 mcg total) by mouth daily.    . [DISCONTINUED] baclofen (LIORESAL) 10 MG tablet Take 0.5-1 tablets (5-10 mg total) by mouth at bedtime as needed for muscle spasms. 30 each 3   No current facility-administered medications on file prior to visit.    Allergies  Allergen Reactions  . Sulfamethoxazole-Trimethoprim Diarrhea and Nausea Only    Objective: Physical Exam  General: Sharon Bailey is a pleasant 86 y.o. Caucasian female, in NAD. AAO x 3.   Vascular:  Neurovascular status unchanged b/l lower extremities. Capillary refill time to digits immediate b/l. Palpable pedal pulses b/l LE. Nonpalpable PT pulse(s) b/l lower extremities. Pedal hair absent. Lower extremity skin temperature gradient within normal limits. No ischemia or gangrene noted b/l  lower extremities.  Dermatological:  Pedal skin is thin shiny, atrophic bilaterally. No open wounds bilaterally. Toenails 1-5 b/l elongated, dystrophic, thickened, crumbly with subungual debris and tenderness to dorsal palpation.  Musculoskeletal:  Normal muscle strength 5/5 to all lower extremity muscle groups bilaterally. No pain crepitus or joint limitation noted with ROM b/l. Hammertoes noted to the L 2nd toe, L 3rd toe, L 4th toe, R 2nd toe, R 3rd toe and R 4th toe. 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  Neurological:  Protective sensation intact 5/5 intact bilaterally with 10g monofilament b/l. Proprioception intact bilaterally.  Assessment and Plan:  1. Pain due to onychomycosis of toenails of both feet     -Examined patient. -No new findings. No new orders. -Toenails 1-5 b/l were debrided in length and girth with sterile nail nippers and dremel without iatrogenic bleeding.  -Iatrogenic laceration sustained during L 4th toe.  Treated with Lumicain Hemostatic Solution and alcohol. Patient instructed to apply Neosporin to L 4th toe once daily for 7 days. -Patient to report any pedal injuries to medical professional immediately.  Return in about 3 months (around 02/24/2020).  Freddie Breech, DPM

## 2019-12-04 DIAGNOSIS — H353221 Exudative age-related macular degeneration, left eye, with active choroidal neovascularization: Secondary | ICD-10-CM | POA: Diagnosis not present

## 2019-12-08 ENCOUNTER — Ambulatory Visit: Payer: Medicare PPO | Admitting: Cardiology

## 2019-12-11 DIAGNOSIS — H353221 Exudative age-related macular degeneration, left eye, with active choroidal neovascularization: Secondary | ICD-10-CM | POA: Diagnosis not present

## 2019-12-11 DIAGNOSIS — H353112 Nonexudative age-related macular degeneration, right eye, intermediate dry stage: Secondary | ICD-10-CM | POA: Diagnosis not present

## 2019-12-11 DIAGNOSIS — H35363 Drusen (degenerative) of macula, bilateral: Secondary | ICD-10-CM | POA: Diagnosis not present

## 2019-12-11 DIAGNOSIS — Z961 Presence of intraocular lens: Secondary | ICD-10-CM | POA: Diagnosis not present

## 2019-12-11 DIAGNOSIS — H35453 Secondary pigmentary degeneration, bilateral: Secondary | ICD-10-CM | POA: Diagnosis not present

## 2019-12-19 ENCOUNTER — Encounter: Payer: Self-pay | Admitting: Family Medicine

## 2019-12-19 ENCOUNTER — Other Ambulatory Visit: Payer: Self-pay

## 2019-12-19 ENCOUNTER — Ambulatory Visit (INDEPENDENT_AMBULATORY_CARE_PROVIDER_SITE_OTHER): Payer: Medicare PPO | Admitting: Family Medicine

## 2019-12-19 ENCOUNTER — Ambulatory Visit (INDEPENDENT_AMBULATORY_CARE_PROVIDER_SITE_OTHER): Payer: Medicare PPO

## 2019-12-19 DIAGNOSIS — M25552 Pain in left hip: Secondary | ICD-10-CM

## 2019-12-19 NOTE — Progress Notes (Signed)
Office Visit Note   Patient: Sharon Bailey           Date of Birth: 1930/07/24           MRN: [id number] Visit Date: 12/19/2019 Requested by: Clovis Riley, L.August Saucer, MD [address] [place],  [place] [address] PCP: Clovis Riley, L.August Saucer, MD  Subjective: Chief Complaint  Patient presents with  . Left Hip - Pain    Pain in lateral hip x 2 weeks. Dull pain in the hip and thigh. "Feels like the bones are scraping" when walking.     HPI: 86yo F presenting to clinic with concerns of 2 weeks of left lateral hip pain. Patient denies trauma prior to the onset of her symptoms. She says that she feels like her lateral hip 'grinds' when she walks, and it causes significant pain when getting up from seated and when walking around. Denies pain radiating into her groin.               ROS:   All other systems were reviewed and are negative.  Objective: Vital Signs: There were no vitals taken for this visit.  Physical Exam:  General:  Alert and oriented, in no acute distress. Pulm:  Breathing unlabored. Psy:  Normal mood, congruent affect. Skin:  Left lateral hip with no bruising, no rashes. Overlying skin intact without erythema.   Left hip:  Coxalgic gait favoring left leg.  No varus or valgus deformity of the knee, appropriate Q angle of the hip.  No pelvic asymmetry.  Full range of motion of hip with no pain.  Symmetric internal and external rotation without worsening of pain. Strength: 5 out of 5 strength with hip flexion, abduction and adduction as well as knee flexion and extension, and ankle dorsi/plantarflexion.  Palpation:  Endorses significant tenderness to palpation over the greater trochanteric area.  Tenderness over the piriformis, or gluteal musculature.  No tenderness over bilateral SI joints.  Spring's test negative. No anterior hip flexor tender points.   Supine exam: No pain with logroll.  FADIR produces no deep pain. FABER worsens trochanteric pain.   Imaging: Left  Hip Xray, 3 View:  Moderate joint space loss, with sclerosis as well as cystic changes appreciated within the acetabulum. No evidence of acute fracture or malignancy.   Moderate Degenerative Joint Disease  Assessment & Plan: 86yo F presenting to clinic with concerns of lateral hip pain. Examination reassuring against true hip pathology, though Xray does have moderate degenerative changes. Given significant tenderness to Trochanteric region, offered to do bursal injection today, to which patient agreed. Injection performed as described below, and patient voiced immediate improvement in her symptoms.  - Strict return precautions were discussed.      Procedures: Greater Trochanteric Cortisone Injection:  Risks and benefits of procedure discussed, Patient opted to proceed. Verbal Consent obtained.  Timeout performed.  Skin prepped in a sterile fashion with betadine before further cleansing with alcohol. Ethyl Chloride was used for topical analgesia.  Area of maximal tenderness over the left greater trochanter was injected with 3cc 1% Bupivocaine without epinephrine via the using a 22G, spinal needle. Syringe was removed from the needle, and 6mg  betamethasone was then injected into the joint.   Patient tolerated the injection well with no immediate complications. Aftercare instructions were discussed, and patient was given strict return precautions.     PMFS History: Patient Active Problem List   Diagnosis Date Noted  . History of CVA (cerebrovascular accident) 09/06/2019  . Hypertensive  emergency 06/12/2019  . Acute CVA (cerebrovascular accident) (HCC) 06/11/2019  . Nausea and vomiting 06/11/2019  . AKI (acute kidney injury) (HCC) 06/11/2019  . Metabolic acidosis 06/11/2019  . 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 Obstructive sleep apnea 10/31/2018  . Sensorineural hearing loss (SNHL) of both ears 10/31/2018  . Low back pain 09/21/2017  . Chronic fatigue 02/05/2017  .  Atypical chest pain 02/05/2017  . Hyperlipidemia 02/05/2017  . Palpitations 04/18/2016  . Essential hypertension 04/18/2016  . Hypertensive heart disease without heart failure 04/18/2016  . Status post foot surgery 10/11/2012  . Gout attack 09/28/2012  . Hammer toe of left foot 09/28/2012  . Pain in joint, ankle and foot 09/28/2012   Past Medical History:  Diagnosis Date  . Abnormal EKG   . Collagenous colitis   . Collagenous colitis   . Diverticulitis   . Diverticulosis   . Fluttering heart   . GERD (gastroesophageal reflux disease)   . Gout   . Gout attack 09/28/2012  . Hammer toe of left foot 09/28/2012  . Hyperlipidemia   . Hypertension   . Hypertensive emergency   . Hypothyroidism   . Irregular heart beat   . Pain in joint, ankle and foot 09/28/2012  . Status post foot surgery 10/11/2012  . Thyroid disease     Family History  Problem Relation Age of Onset  . Breast cancer Mother   . Memory loss Father   . Heart disease Sister   . Rheumatic fever Sister   . Lung disease Brother   . Colon cancer Neg Hx     Past Surgical History:  Procedure Laterality Date  . ABDOMINAL HYSTERECTOMY    . ADENOIDECTOMY    . CHOLECYSTECTOMY    . Excision Ganglion Toe Left 09/29/2012   Lt #2 @ [facility]  . Hammer toe repair Left 09/29/2012   Lt #2 @ [facility]  . TONSILLECTOMY     Social History   Occupational History  . Not on file  Tobacco Use  . Smoking status: Never Smoker  . Smokeless tobacco: Never Used  Vaping Use  . Vaping Use: Never used  Substance and Sexual Activity  . Alcohol use: No    Alcohol/week: 0.0 standard drinks  . Drug use: No  . Sexual activity: Not on file

## 2019-12-19 NOTE — Progress Notes (Signed)
I saw and examined the patient with Dr. Marga Hoots and agree with assessment and plan as outlined.    Left hip greater trochanter pain, injected today with good results.  Return as needed.

## 2019-12-21 ENCOUNTER — Telehealth: Payer: Self-pay | Admitting: Family Medicine

## 2019-12-21 NOTE — Telephone Encounter (Signed)
Patient called. She would like a refill on Tramadol. Her call back number is [phone]

## 2019-12-21 NOTE — Telephone Encounter (Signed)
Please advise 

## 2019-12-22 MED ORDER — TRAMADOL HCL 50 MG PO TABS
50.0000 mg | ORAL_TABLET | Freq: Two times a day (BID) | ORAL | 0 refills | Status: DC | PRN
Start: 2019-12-22 — End: 2020-02-22

## 2019-12-22 NOTE — Telephone Encounter (Signed)
Done

## 2019-12-22 NOTE — Telephone Encounter (Signed)
Called and left voice mail that this was done.

## 2019-12-22 NOTE — Addendum Note (Signed)
Addended by: Lillia Carmel on: 12/22/2019 09:12 AM   Modules accepted: Orders

## 2020-01-01 DIAGNOSIS — E039 Hypothyroidism, unspecified: Secondary | ICD-10-CM | POA: Diagnosis not present

## 2020-01-01 DIAGNOSIS — J309 Allergic rhinitis, unspecified: Secondary | ICD-10-CM | POA: Diagnosis not present

## 2020-01-01 DIAGNOSIS — R011 Cardiac murmur, unspecified: Secondary | ICD-10-CM | POA: Diagnosis not present

## 2020-01-01 DIAGNOSIS — N183 Chronic kidney disease, stage 3 unspecified: Secondary | ICD-10-CM | POA: Diagnosis not present

## 2020-01-01 DIAGNOSIS — M109 Gout, unspecified: Secondary | ICD-10-CM | POA: Diagnosis not present

## 2020-01-01 DIAGNOSIS — E7800 Pure hypercholesterolemia, unspecified: Secondary | ICD-10-CM | POA: Diagnosis not present

## 2020-01-01 DIAGNOSIS — I10 Essential (primary) hypertension: Secondary | ICD-10-CM | POA: Diagnosis not present

## 2020-01-01 DIAGNOSIS — Z0000 Encounter for general adult medical examination without abnormal findings: Secondary | ICD-10-CM | POA: Diagnosis not present

## 2020-01-01 DIAGNOSIS — D692 Other nonthrombocytopenic purpura: Secondary | ICD-10-CM | POA: Diagnosis not present

## 2020-01-02 ENCOUNTER — Other Ambulatory Visit: Payer: Self-pay | Admitting: Cardiology

## 2020-01-11 ENCOUNTER — Ambulatory Visit: Payer: Medicare PPO | Admitting: Cardiology

## 2020-01-11 ENCOUNTER — Encounter: Payer: Self-pay | Admitting: Cardiology

## 2020-01-11 ENCOUNTER — Other Ambulatory Visit: Payer: Self-pay

## 2020-01-11 VITALS — BP 138/72 | HR 97 | Ht 64.0 in | Wt 153.2 lb

## 2020-01-11 DIAGNOSIS — Z7189 Other specified counseling: Secondary | ICD-10-CM | POA: Diagnosis not present

## 2020-01-11 DIAGNOSIS — Z8673 Personal history of transient ischemic attack (TIA), and cerebral infarction without residual deficits: Secondary | ICD-10-CM | POA: Diagnosis not present

## 2020-01-11 DIAGNOSIS — I872 Venous insufficiency (chronic) (peripheral): Secondary | ICD-10-CM | POA: Insufficient documentation

## 2020-01-11 DIAGNOSIS — R5382 Chronic fatigue, unspecified: Secondary | ICD-10-CM

## 2020-01-11 DIAGNOSIS — I10 Essential (primary) hypertension: Secondary | ICD-10-CM | POA: Diagnosis not present

## 2020-01-11 NOTE — Patient Instructions (Signed)
Medication Instructions:  Your Physician recommend you continue on your current medication as directed.    *If you need a refill on your cardiac medications before your next appointment, please call your pharmacy*   Lab Work: None   Testing/Procedures: None   Follow-Up: At [facility], you and your health needs are our priority.  As part of our continuing mission to provide you with exceptional heart care, we have created designated Provider Care Teams.  These Care Teams include your primary Cardiologist (physician) and Advanced Practice Providers (APPs -  Physician Assistants and Nurse Practitioners) who all work together to provide you with the care you need, when you need it.  We recommend signing up for the patient portal called "MyChart".  Sign up information is provided on this After Visit Summary.  MyChart is used to connect with patients for Virtual Visits (Telemedicine).  Patients are able to view lab/test results, encounter notes, upcoming appointments, etc.  Non-urgent messages can be sent to your provider as well.   To learn more about what you can do with MyChart, go to [email or web address].    Your next appointment:   9 month(s)  The format for your next appointment:   In Person  Provider:   Bridgette Christopher, MD     

## 2020-01-11 NOTE — Progress Notes (Signed)
Cardiology Office Note:    Date:  01/11/2020   ID:  Sharon Bailey, DOB 01-16-1930, MRN [id number]  PCP:  Sharon Graff.Marlou Sa, MD  Cardiologist:  Buford Dresser, MD  Referring MD: Sharon Graff.Marlou Sa, MD   CC: follow up  History of Present Illness:    Sharon Bailey is a 87 y.o. female with a hx of hypertension, hyperlipidemia, history of CVA and gout. I first met her 08/26/18.  Today: Here with her daughter today. Did full medication reconciliation today. Feels about once/month that she is dragging, this is her usual symptom when her blood pressure is low. No issues with her medications. Doesn't have BP cuff with her today. Has a wrist cuff.   Had recent visit 01/01/20 with Dr. Alroy Dust, reported BP was up in the 170s at that visit, but I cannot see the note. No changes to medications at that visit.  We discussed chronic venous insufficiency, compression/elevation today.  Denies chest pain, shortness of breath at rest or with normal exertion. No PND, orthopnea, LE edema or unexpected weight gain. No syncope or palpitations.  ROS positive for chronic fatigue, unchanged as well as chronic back pain.  Past Medical History:  Diagnosis Date  . Abnormal EKG   . Collagenous colitis   . Collagenous colitis   . Diverticulitis   . Diverticulosis   . Fluttering heart   . GERD (gastroesophageal reflux disease)   . Gout   . Gout attack 09/28/2012  . Hammer toe of left foot 09/28/2012  . Hyperlipidemia   . Hypertension   . Hypertensive emergency   . Hypothyroidism   . Irregular heart beat   . Pain in joint, ankle and foot 09/28/2012  . Status post foot surgery 10/11/2012  . Thyroid disease    Past Surgical History:  Procedure Laterality Date  . ABDOMINAL HYSTERECTOMY    . ADENOIDECTOMY    . CHOLECYSTECTOMY    . Excision Ganglion Toe Left 09/29/2012   Lt #2 @ [facility]  . Hammer toe repair Left 09/29/2012   Lt #2 @ [facility]  . TONSILLECTOMY       Current Meds  Medication Sig  .  allopurinol (ZYLOPRIM) 300 MG tablet Take 300 mg by mouth daily.  Marland Kitchen amLODipine (NORVASC) 5 MG tablet TAKE 1 TABLET BY MOUTH EVERY DAY  . ascorbic acid (VITAMIN C) 1000 MG tablet Take 1 tablet (1,000 mg total) by mouth daily.  Marland Kitchen aspirin EC 81 MG EC tablet Take 1 tablet (81 mg total) by mouth daily.  . baclofen (LIORESAL) 10 MG tablet TAKE 0.5-1 TABLETS (5-10 MG TOTAL) BY MOUTH AT BEDTIME AS NEEDED FOR MUSCLE SPASMS.  . budesonide (ENTOCORT EC) 3 MG 24 hr capsule Take 1 capsule (3 mg total) by mouth daily.  Marland Kitchen HYDROcodone-acetaminophen (NORCO/VICODIN) 5-325 MG tablet Take 1 tablet by mouth every 4 (four) hours as needed for moderate pain.  Marland Kitchen levothyroxine (SYNTHROID) 88 MCG tablet Take 88 mcg by mouth daily before breakfast.  . lisinopril (ZESTRIL) 40 MG tablet Take 1 tablet (40 mg total) by mouth daily.  . meclizine (ANTIVERT) 12.5 MG tablet Take 1 tablet (12.5 mg total) by mouth 3 (three) times daily as needed for dizziness.  . ondansetron (ZOFRAN ODT) 4 MG disintegrating tablet Take 1 tablet (4 mg total) by mouth every 8 (eight) hours as needed for nausea or vomiting.  . simvastatin (ZOCOR) 40 MG tablet Take 40 mg by mouth daily.  . traMADol (ULTRAM) 50 MG tablet Take 1 tablet (50 mg total)  by mouth every 12 (twelve) hours as needed for moderate pain.  . vitamin B-12 1000 MCG tablet Take 1 tablet (1,000 mcg total) by mouth daily.     Allergies:   Sulfamethoxazole-trimethoprim   Social History   Tobacco Use  . Smoking status: Never Smoker  . Smokeless tobacco: Never Used  Vaping Use  . Vaping Use: Never used  Substance Use Topics  . Alcohol use: No    Alcohol/week: 0.0 standard drinks  . Drug use: No     Family Hx: The patient's family history includes Breast cancer in her mother; Heart disease in her sister; Lung disease in her brother; Memory loss in her father; Rheumatic fever in her sister. There is no history of Colon cancer.  ROS:   Please see the history of present illness.     All other systems reviewed and are negative.   Prior CV studies:   The following studies were reviewed today: Echo 06/12/19 1. Left ventricular ejection fraction, by estimation, is 65 to 70%. The  left ventricle has normal function. The left ventricle has no regional  wall motion abnormalities. There is severe left ventricular hypertrophy.  Left ventricular diastolic parameters  are consistent with Grade I diastolic dysfunction (impaired relaxation).  Elevated left ventricular end-diastolic pressure.  2. Right ventricular systolic function is normal. The right ventricular  size is normal. There is mildly elevated pulmonary artery systolic  pressure.  3. The mitral valve is grossly normal. Trivial mitral valve  regurgitation.  4. The aortic valve is tricuspid. Aortic valve regurgitation is trivial.  Mild aortic valve stenosis. Aortic valve area, by VTI measures 1.67 cm.  Aortic valve mean gradient measures 11.0 mmHg. Aortic valve Vmax measures  2.50 m/s.  5. The inferior vena cava is normal in size with <50% respiratory  variability, suggesting right atrial pressure of 8 mmHg.   Labs/Other Tests and Data Reviewed:    EKG:  An ECG dated 10/22/19 was personally reviewed today and demonstrated:  NSR, IVCD in left bundle pattern  Recent Labs: 07/05/2019: ALT 28 10/22/2019: BUN 28; Creatinine, Ser 1.52; Hemoglobin 11.6; Platelets 193; Potassium 4.2; Sodium 142   Recent Lipid Panel Lab Results  Component Value Date/Time   CHOL 140 06/12/2019 07:11 AM   TRIG 105 06/12/2019 07:11 AM   HDL 47 06/12/2019 07:11 AM   CHOLHDL 3.0 06/12/2019 07:11 AM   LDLCALC 72 06/12/2019 07:11 AM    Wt Readings from Last 3 Encounters:  01/11/20 153 lb 3.2 oz (69.5 kg)  10/23/19 155 lb (70.3 kg)  09/06/19 154 lb (69.9 kg)     Objective:    Vital Signs:  BP 138/72 (BP Location: Left Arm)   Pulse 97   Ht 5' 4"  (1.626 m)   Wt 153 lb 3.2 oz (69.5 kg)   SpO2 98%   BMI 26.30 kg/m    GEN:  Well nourished, well developed in no acute distress HEENT: Normal, moist mucous membranes NECK: No JVD CARDIAC: regular rhythm, normal S1 and S2, no rubs or gallops. 2/6 systolic murmur. VASCULAR: Radial and DP pulses 2+ bilaterally. No carotid bruits RESPIRATORY:  Clear to auscultation without rales, wheezing or rhonchi  ABDOMEN: Soft, non-tender, non-distended MUSCULOSKELETAL:  Ambulates independently SKIN: Warm and dry. Lower extremity discoloration with mild nonpitting edema consistent with venous insufficiency NEUROLOGIC:  Alert and oriented x 3. No focal neuro deficits noted. PSYCHIATRIC:  Normal affect   ASSESSMENT & PLAN:    Hypertension:  -we discussed BP variability. She has  occasional lows, generally reasonable to aim for BP in the 223C systolic -she will contact me if her BP becomes routinely elevated -continue amlodipine 5 mg daily  -has had carvedilol stopped, unclear when/why, but will not restart at this time -continue lisinopril 40 mg daily -has said she does not want diuretic/anything that makes her urinate more  Chronic venous insufficiency -discussed compression, elevation -we reviewed diagrams of what this is, how it is managed  Chronic fatigue: -feels like overall she is doing well, except when her blood pressure is low -has had echo, monitor, and stress test in last two years (while having symptoms) that have not show cardiac etiology  History of CVA Cardiac risk counseling and primary prevention recommendations -see prior discussion re: statins. Has tolerated simvastatin, cannot raise dose due to amlodipine interaction -counseled on red flag warning signs -last lipids 06/12/19, LDL 72. Ideal goal <70 -on aspirin 81 mg daily  Plan for follow up: 9 mos, then stagger every 6 mos with PCP visits  Patient Instructions  Medication Instructions:  Your Physician recommend you continue on your current medication as directed.    *If you need a refill on your  cardiac medications before your next appointment, please call your pharmacy*   Lab Work: None  Testing/Procedures: None   Follow-Up: At [facility], you and your health needs are our priority.  As part of our continuing mission to provide you with exceptional heart care, we have created designated Provider Care Teams.  These Care Teams include your primary Cardiologist (physician) and Advanced Practice Providers (APPs -  Physician Assistants and Nurse Practitioners) who all work together to provide you with the care you need, when you need it.  We recommend signing up for the patient portal called "MyChart".  Sign up information is provided on this After Visit Summary.  MyChart is used to connect with patients for Virtual Visits (Telemedicine).  Patients are able to view lab/test results, encounter notes, upcoming appointments, etc.  Non-urgent messages can be sent to your provider as well.   To learn more about what you can do with MyChart, go to [email or web address].    Your next appointment:   9 month(s)  The format for your next appointment:   In Person  Provider:   Buford Dresser, MD      Signed, Buford Dresser, MD  01/11/2020 1:06 PM    [place]

## 2020-01-17 DIAGNOSIS — H353231 Exudative age-related macular degeneration, bilateral, with active choroidal neovascularization: Secondary | ICD-10-CM | POA: Diagnosis not present

## 2020-01-17 DIAGNOSIS — H04123 Dry eye syndrome of bilateral lacrimal glands: Secondary | ICD-10-CM | POA: Diagnosis not present

## 2020-01-17 DIAGNOSIS — H524 Presbyopia: Secondary | ICD-10-CM | POA: Diagnosis not present

## 2020-01-17 DIAGNOSIS — Z961 Presence of intraocular lens: Secondary | ICD-10-CM | POA: Diagnosis not present

## 2020-01-29 DIAGNOSIS — H353221 Exudative age-related macular degeneration, left eye, with active choroidal neovascularization: Secondary | ICD-10-CM | POA: Diagnosis not present

## 2020-02-09 DIAGNOSIS — R399 Unspecified symptoms and signs involving the genitourinary system: Secondary | ICD-10-CM | POA: Diagnosis not present

## 2020-02-09 DIAGNOSIS — R609 Edema, unspecified: Secondary | ICD-10-CM | POA: Diagnosis not present

## 2020-02-14 DIAGNOSIS — R399 Unspecified symptoms and signs involving the genitourinary system: Secondary | ICD-10-CM | POA: Diagnosis not present

## 2020-02-22 ENCOUNTER — Encounter: Payer: Self-pay | Admitting: Internal Medicine

## 2020-02-22 ENCOUNTER — Ambulatory Visit: Payer: Medicare PPO | Admitting: Internal Medicine

## 2020-02-22 VITALS — BP 158/60 | HR 86 | Ht 64.0 in | Wt 155.0 lb

## 2020-02-22 DIAGNOSIS — K52839 Microscopic colitis, unspecified: Secondary | ICD-10-CM

## 2020-02-22 MED ORDER — BUDESONIDE 3 MG PO CPEP
3.0000 mg | ORAL_CAPSULE | Freq: Every day | ORAL | 3 refills | Status: DC
Start: 2020-02-22 — End: 2020-03-11

## 2020-02-22 NOTE — Patient Instructions (Signed)
If you are age 85 or older, your body mass index should be between 23-30. Your Body mass index is 26.61 kg/m. If this is out of the aforementioned range listed, please consider follow up with your Primary Care Provider.  If you are age 48 or younger, your body mass index should be between 19-25. Your Body mass index is 26.61 kg/m. If this is out of the aformentioned range listed, please consider follow up with your Primary Care Provider.   We have sent the following medications to your pharmacy for you to pick up at your convenience:  Budesonide

## 2020-02-26 ENCOUNTER — Encounter: Payer: Self-pay | Admitting: Internal Medicine

## 2020-02-26 NOTE — Progress Notes (Signed)
HISTORY OF PRESENT ILLNESS:  Sharon Bailey is a 87 y.o. female with biopsy-proven collagenous colitis (December 2016) who presents today for follow-up.  She was last evaluated February 20, 2019.  At that time she was doing well with on-demand budesonide.  Presents today for routine follow-up and requests medication refill.  Patient tells me that for the most part she does well with budesonide 3 mg daily.  She is off medication, she will have recurrent diarrhea within 1 week.  Thus, she tends to 3 mg of budesonide most days.  She is tolerating the medication well without complaints.  GI review of systems is otherwise negative.  She has completed her COVID vaccination series  REVIEW OF SYSTEMS:  All non-GI ROS negative unless otherwise stated in HPI.  Past Medical History:  Diagnosis Date  . Abnormal EKG   . Collagenous colitis   . Collagenous colitis   . Diverticulitis   . Diverticulosis   . Fluttering heart   . GERD (gastroesophageal reflux disease)   . Gout   . Gout attack 09/28/2012  . Hammer toe of left foot 09/28/2012  . Hyperlipidemia   . Hypertension   . Hypertensive emergency   . Hypothyroidism   . Irregular heart beat   . Pain in joint, ankle and foot 09/28/2012  . Status post foot surgery 10/11/2012  . Thyroid disease     Past Surgical History:  Procedure Laterality Date  . ABDOMINAL HYSTERECTOMY    . ADENOIDECTOMY    . CHOLECYSTECTOMY    . Excision Ganglion Toe Left 09/29/2012   Lt #2 @ [facility]  . Hammer toe repair Left 09/29/2012   Lt #2 @ [facility]  . TONSILLECTOMY      Social History DAYONA SHAHEEN  reports that she has never smoked. She has never used smokeless tobacco. She reports that she does not drink alcohol and does not use drugs.  family history includes Breast cancer in her mother; Heart disease in her sister; Lung disease in her brother; Memory loss in her father; Rheumatic fever in her sister.  Allergies  Allergen Reactions  . Sulfamethoxazole-Trimethoprim  Diarrhea and Nausea Only       PHYSICAL EXAMINATION: Vital signs: BP (!) 158/60   Pulse 86   Ht 5\' 4"  (1.626 m)   Wt 155 lb (70.3 kg)   BMI 26.61 kg/m   Constitutional: generally well-appearing, no acute distress Psychiatric: alert and oriented x3, cooperative Eyes: extraocular movements intact, anicteric, conjunctiva pink Mouth: oral pharynx moist, no lesions Neck: supple no lymphadenopathy Cardiovascular: heart regular rate and rhythm, systolic murmur present Lungs: clear to auscultation bilaterally Abdomen: soft, nontender, nondistended, no obvious ascites, no peritoneal signs, normal bowel sounds, no organomegaly Rectal: Omitted Extremities: no clubbing or cyanosis.  1+ lower extremity edema bilaterally Skin: no lesions on visible extremities Neuro: No focal deficits. No asterixis.    ASSESSMENT:  1.  Collagenous colitis.  Managed with low-dose budesonide as described 2.  Previous colonoscopy 2016   PLAN:  1.  Continue budesonide.  Lowest dose to control symptoms 2.  Refill medication.  Medication risks reviewed 3.  Routine office follow-up 1 year.  Sooner if needed

## 2020-03-05 ENCOUNTER — Telehealth: Payer: Self-pay | Admitting: Internal Medicine

## 2020-03-05 NOTE — Telephone Encounter (Signed)
Inbound call from patient stating prior authorization is needed for Budesonide medication please.

## 2020-03-05 NOTE — Telephone Encounter (Signed)
Submitted PA for Budesonide through Cover My Meds.  Awaiting response °

## 2020-03-08 ENCOUNTER — Other Ambulatory Visit: Payer: Self-pay

## 2020-03-08 ENCOUNTER — Ambulatory Visit: Payer: Medicare PPO | Admitting: Podiatry

## 2020-03-08 DIAGNOSIS — B351 Tinea unguium: Secondary | ICD-10-CM | POA: Diagnosis not present

## 2020-03-08 DIAGNOSIS — M79675 Pain in left toe(s): Secondary | ICD-10-CM

## 2020-03-08 DIAGNOSIS — M79674 Pain in right toe(s): Secondary | ICD-10-CM

## 2020-03-08 DIAGNOSIS — I739 Peripheral vascular disease, unspecified: Secondary | ICD-10-CM

## 2020-03-11 MED ORDER — BUDESONIDE 3 MG PO CPEP
3.0000 mg | ORAL_CAPSULE | Freq: Every day | ORAL | 3 refills | Status: DC
Start: 2020-03-11 — End: 2021-02-03

## 2020-03-11 NOTE — Telephone Encounter (Signed)
Budesonide approved and resent

## 2020-03-11 NOTE — Addendum Note (Signed)
Addended by: Jeanine Luz on: 03/11/2020 01:27 PM   Modules accepted: Orders

## 2020-03-16 ENCOUNTER — Encounter: Payer: Self-pay | Admitting: Podiatry

## 2020-03-16 NOTE — Progress Notes (Signed)
Subjective: Sharon Bailey is a 87 y.o. female patient seen today painful mycotic nails b/l that are difficult to trim. Pain interferes with ambulation. Aggravating factors include wearing enclosed shoe gear. Pain is relieved with periodic professional debridement.  She also has h/o gout.  She voices no new pedal problems on today's visit.  PCP is Dr. Elsworth Soho. Last visit was 01/01/2020.  Allergies  Allergen Reactions  . Sulfamethoxazole-Trimethoprim Diarrhea and Nausea Only    Other reaction(s): diarrhea    Objective: Physical Exam  General: Sharon Bailey is a pleasant 87 y.o. Caucasian female, in NAD. AAO x 3.   Vascular:  Neurovascular status unchanged b/l lower extremities. Capillary refill time to digits immediate b/l. Palpable DP pulse(s) b/l lower extremities Nonpalpable PT pulse(s) b/l lower extremities. Pedal hair absent. Lower extremity skin temperature gradient within normal limits. No ischemia or gangrene noted b/l lower extremities.  Dermatological:  Pedal skin is thin shiny, atrophic bilaterally. No open wounds bilaterally. Toenails 1-5 b/l elongated, dystrophic, thickened, crumbly with subungual debris and tenderness to dorsal palpation.  Musculoskeletal:  Normal muscle strength 5/5 to all lower extremity muscle groups bilaterally. No pain crepitus or joint limitation noted with ROM b/l. Hammertoes noted to the L 2nd toe, L 3rd toe, L 4th toe, R 2nd toe, R 3rd toe and R 4th toe. Brachymetatarsia left 3rd digit.  Neurological:  Protective sensation intact 5/5 intact bilaterally with 10g monofilament b/l. Proprioception intact bilaterally.  Assessment and Plan:  1. Pain due to onychomycosis of toenails of both feet   2. PAD (peripheral artery disease) (HCC)     -Examined patient. -No new findings. No new orders. -Patient to continue soft, supportive shoe gear daily. -Toenails 1-5 b/l were debrided in length and girth with sterile nail nippers and dremel  without iatrogenic bleeding.  -Patient to report any pedal injuries to medical professional immediately. -Patient/POA to call should there be question/concern in the interim.  Return in about 3 months (around 06/08/2020).  Freddie Breech, DPM

## 2020-04-01 DIAGNOSIS — H353221 Exudative age-related macular degeneration, left eye, with active choroidal neovascularization: Secondary | ICD-10-CM | POA: Diagnosis not present

## 2020-04-12 ENCOUNTER — Other Ambulatory Visit: Payer: Self-pay | Admitting: Cardiology

## 2020-05-27 DIAGNOSIS — H353221 Exudative age-related macular degeneration, left eye, with active choroidal neovascularization: Secondary | ICD-10-CM | POA: Diagnosis not present

## 2020-06-10 DIAGNOSIS — H53411 Scotoma involving central area, right eye: Secondary | ICD-10-CM | POA: Diagnosis not present

## 2020-06-10 DIAGNOSIS — H31011 Macula scars of posterior pole (postinflammatory) (post-traumatic), right eye: Secondary | ICD-10-CM | POA: Diagnosis not present

## 2020-06-10 DIAGNOSIS — Z961 Presence of intraocular lens: Secondary | ICD-10-CM | POA: Diagnosis not present

## 2020-06-10 DIAGNOSIS — H35453 Secondary pigmentary degeneration, bilateral: Secondary | ICD-10-CM | POA: Diagnosis not present

## 2020-06-10 DIAGNOSIS — H353221 Exudative age-related macular degeneration, left eye, with active choroidal neovascularization: Secondary | ICD-10-CM | POA: Diagnosis not present

## 2020-06-10 DIAGNOSIS — H35363 Drusen (degenerative) of macula, bilateral: Secondary | ICD-10-CM | POA: Diagnosis not present

## 2020-06-10 DIAGNOSIS — H353132 Nonexudative age-related macular degeneration, bilateral, intermediate dry stage: Secondary | ICD-10-CM | POA: Diagnosis not present

## 2020-06-18 ENCOUNTER — Ambulatory Visit: Payer: Medicare PPO | Admitting: Podiatry

## 2020-06-18 ENCOUNTER — Encounter: Payer: Self-pay | Admitting: Podiatry

## 2020-06-18 ENCOUNTER — Other Ambulatory Visit: Payer: Self-pay

## 2020-06-18 DIAGNOSIS — M79675 Pain in left toe(s): Secondary | ICD-10-CM | POA: Diagnosis not present

## 2020-06-18 DIAGNOSIS — B351 Tinea unguium: Secondary | ICD-10-CM

## 2020-06-18 DIAGNOSIS — E039 Hypothyroidism, unspecified: Secondary | ICD-10-CM | POA: Insufficient documentation

## 2020-06-18 DIAGNOSIS — M79674 Pain in right toe(s): Secondary | ICD-10-CM

## 2020-06-18 DIAGNOSIS — I739 Peripheral vascular disease, unspecified: Secondary | ICD-10-CM

## 2020-06-18 DIAGNOSIS — I679 Cerebrovascular disease, unspecified: Secondary | ICD-10-CM | POA: Insufficient documentation

## 2020-06-18 DIAGNOSIS — N183 Chronic kidney disease, stage 3 unspecified: Secondary | ICD-10-CM | POA: Insufficient documentation

## 2020-06-18 DIAGNOSIS — D692 Other nonthrombocytopenic purpura: Secondary | ICD-10-CM | POA: Insufficient documentation

## 2020-06-18 DIAGNOSIS — J309 Allergic rhinitis, unspecified: Secondary | ICD-10-CM | POA: Insufficient documentation

## 2020-06-18 DIAGNOSIS — R350 Frequency of micturition: Secondary | ICD-10-CM | POA: Insufficient documentation

## 2020-06-24 NOTE — Progress Notes (Signed)
Subjective: Sharon Bailey is a pleasant 87 y.o. female patient seen today painful thick toenails that are difficult to trim. Pain interferes with ambulation. Aggravating factors include wearing enclosed shoe gear. Pain is relieved with periodic professional debridement.  PCP is Clovis Riley, L.August Saucer, MD. Last visit was: June, 2022.  Allergies  Allergen Reactions   Sulfamethoxazole-Trimethoprim Diarrhea and Nausea Only    Other reaction(s): diarrhea    Objective: Physical Exam  General: Sharon Bailey is a pleasant 87 y.o. Caucasian female, WD, WN in NAD. AAO x 3.   Vascular:  Capillary refill time to digits immediate b/l. Palpable DP pulse(s) b/l lower extremities Nonpalpable PT pulse(s) b/l lower extremities. Pedal hair absent. Lower extremity skin temperature gradient within normal limits. No pain with calf compression b/l.  Dermatological:  Pedal skin with normal turgor, texture and tone bilaterally. No open wounds bilaterally. No interdigital macerations bilaterally. Toenails 1-5 b/l elongated, discolored, dystrophic, thickened, crumbly with subungual debris and tenderness to dorsal palpation.  Musculoskeletal:  Normal muscle strength 5/5 to all lower extremity muscle groups bilaterally. No pain crepitus or joint limitation noted with ROM b/l. Hammertoe(s) noted to the L 2nd toe, L 3rd toe, L 4th toe, R 2nd toe, R 3rd toe, and R 4th toe.  Neurological:  Protective sensation intact 5/5 intact bilaterally with 10g monofilament b/l. Vibratory sensation intact b/l.  Assessment and Plan:  1. Pain due to onychomycosis of toenails of both feet   2. PAD (peripheral artery disease) (HCC)     -Examined patient. -Patient to continue soft, supportive shoe gear daily. -Toenails 1-5 b/l were debrided in length and girth with sterile nail nippers and dremel without iatrogenic bleeding.  -Patient to report any pedal injuries to medical professional immediately. -Patient/POA to call should there  be question/concern in the interim.  Return in about 3 months (around 09/18/2020).  Freddie Breech, DPM

## 2020-07-01 DIAGNOSIS — I10 Essential (primary) hypertension: Secondary | ICD-10-CM | POA: Diagnosis not present

## 2020-07-01 DIAGNOSIS — E7800 Pure hypercholesterolemia, unspecified: Secondary | ICD-10-CM | POA: Diagnosis not present

## 2020-07-01 DIAGNOSIS — N183 Chronic kidney disease, stage 3 unspecified: Secondary | ICD-10-CM | POA: Diagnosis not present

## 2020-07-01 DIAGNOSIS — M109 Gout, unspecified: Secondary | ICD-10-CM | POA: Diagnosis not present

## 2020-07-01 DIAGNOSIS — D692 Other nonthrombocytopenic purpura: Secondary | ICD-10-CM | POA: Diagnosis not present

## 2020-07-01 DIAGNOSIS — J309 Allergic rhinitis, unspecified: Secondary | ICD-10-CM | POA: Diagnosis not present

## 2020-07-01 DIAGNOSIS — E039 Hypothyroidism, unspecified: Secondary | ICD-10-CM | POA: Diagnosis not present

## 2020-07-01 DIAGNOSIS — I679 Cerebrovascular disease, unspecified: Secondary | ICD-10-CM | POA: Diagnosis not present

## 2020-07-29 DIAGNOSIS — H353221 Exudative age-related macular degeneration, left eye, with active choroidal neovascularization: Secondary | ICD-10-CM | POA: Diagnosis not present

## 2020-07-30 ENCOUNTER — Other Ambulatory Visit: Payer: Self-pay

## 2020-07-30 ENCOUNTER — Telehealth: Payer: Self-pay | Admitting: Cardiology

## 2020-07-30 MED ORDER — AMLODIPINE BESYLATE 5 MG PO TABS
5.0000 mg | ORAL_TABLET | Freq: Every day | ORAL | 0 refills | Status: DC
Start: 2020-07-30 — End: 2021-02-17

## 2020-07-30 MED ORDER — AMLODIPINE BESYLATE 5 MG PO TABS
5.0000 mg | ORAL_TABLET | Freq: Every day | ORAL | 0 refills | Status: DC
Start: 2020-07-30 — End: 2020-07-30

## 2020-07-30 NOTE — Telephone Encounter (Signed)
*  STAT* If patient is at the pharmacy, call can be transferred to refill team.   1. Which medications need to be refilled? (please list name of each medication and dose if known) Amlodipine  2. Which pharmacy/location (including street and city if local pharmacy) is medication to be sent to [facility] [place], [place]  3. Do they need a 30 day or 90 day supply? Enough until her appointment on 10-15-20

## 2020-08-24 IMAGING — CT CT HEAD W/O CM
3 of 4 series · 13 of 47 positions shown, 15 images · non-contrast
Comparison: Head CT and brain MRI 06/11/2018.

CLINICAL DATA: Altered mental status today.

EXAM:
CT HEAD WITHOUT CONTRAST
TECHNIQUE: Contiguous axial images were obtained from the base of the skull
through the vertex without intravenous contrast.

[Series 3: head wo · axial · 0.42mm/px · z∈[+1222,+1312]mm · 7 of 25 slices shown, 9 images]
[im 4/25  brain]
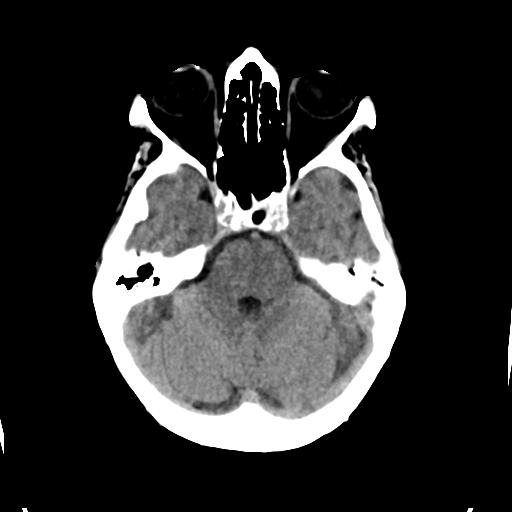
[im 4/25  bone]
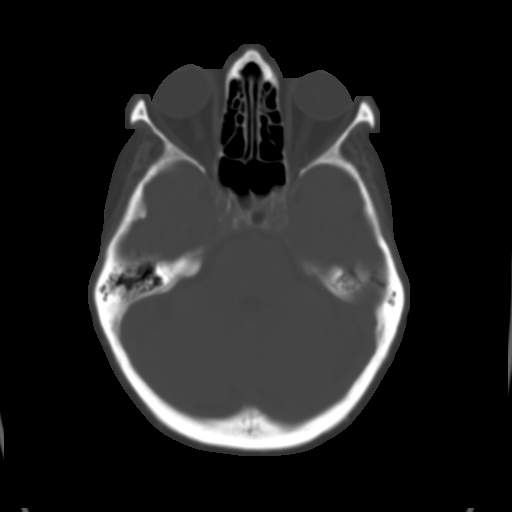
[im 7/25  brain]
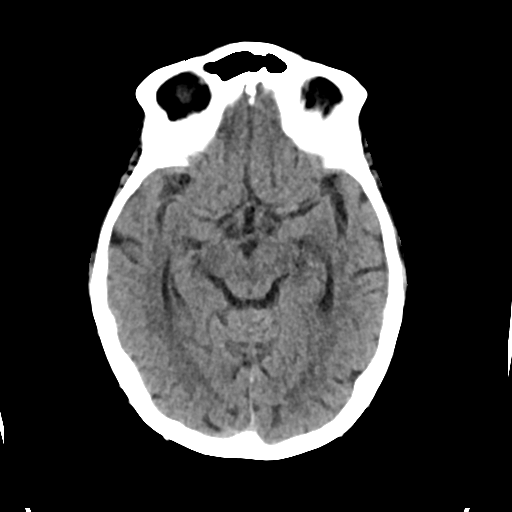
[im 10/25  brain]
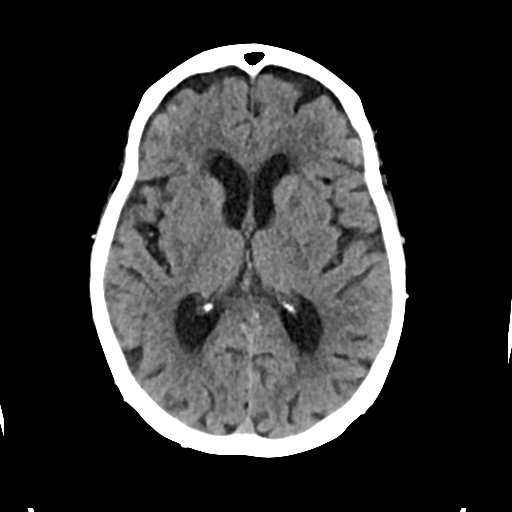
[im 13/25  brain]
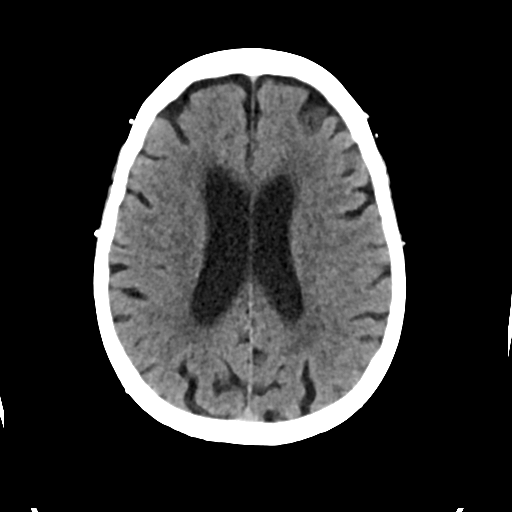
[im 16/25  brain]
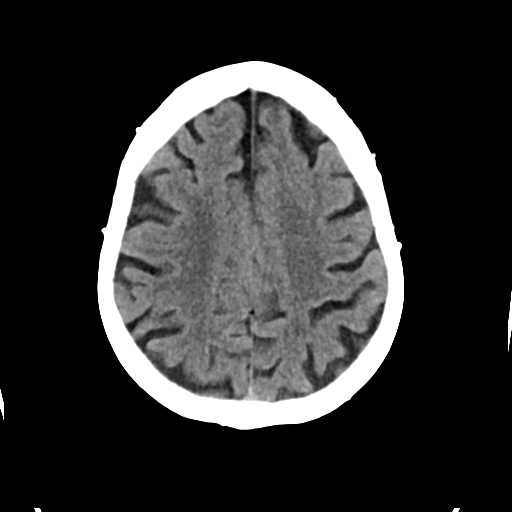
[im 16/25  bone]
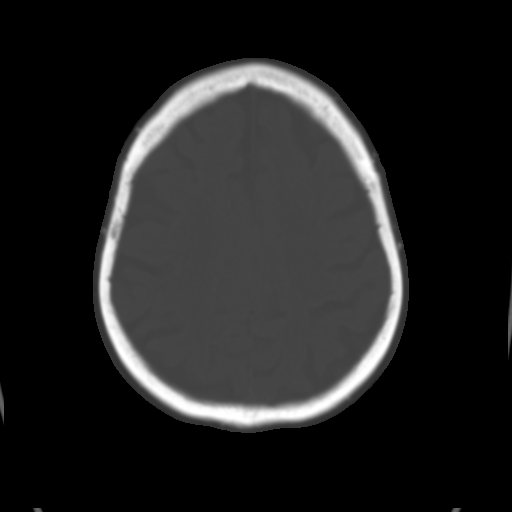
[im 19/25  brain]
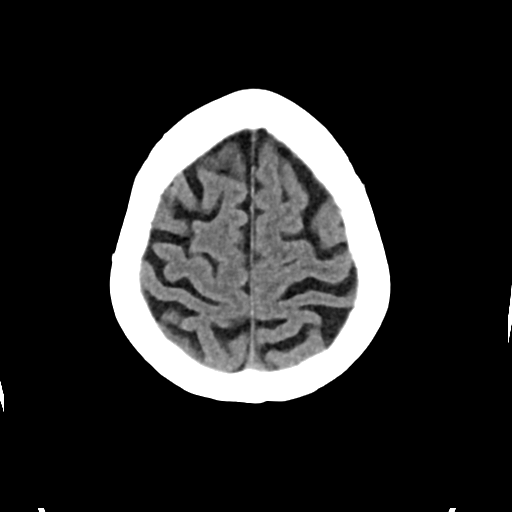
[im 22/25  brain]
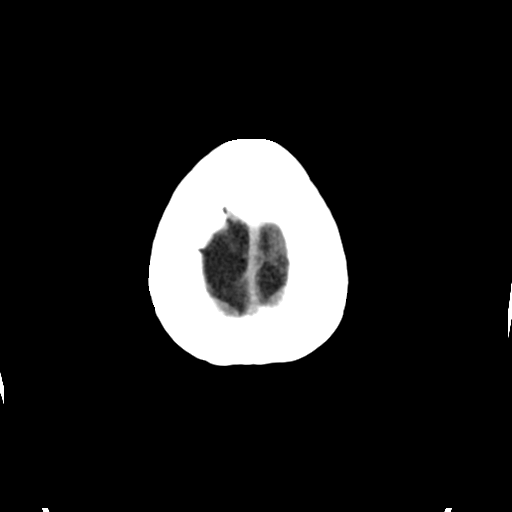

[Series 5: cor soft · coronal · 0.22mm/px · 3 of 62 slices shown]
[im 21/62  brain]
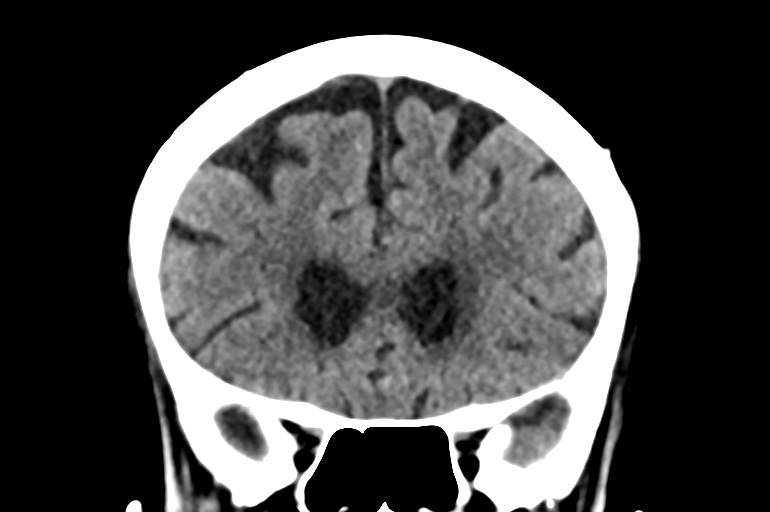
[im 28/62  brain]
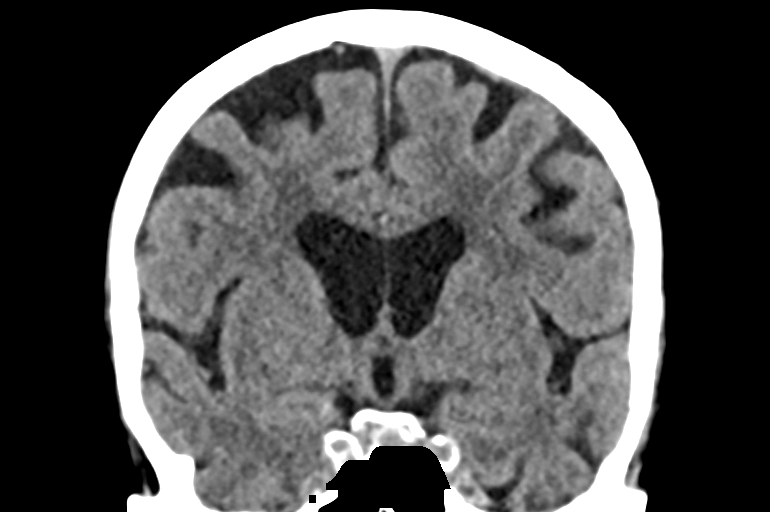
[im 34/62  brain]
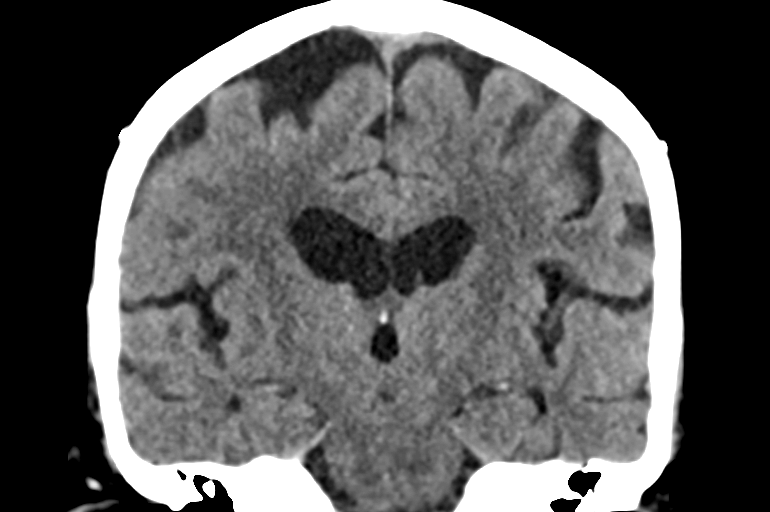

[Series 6: sag soft · sagittal · 0.22mm/px · 3 of 56 slices shown]
[im 19/56  brain]
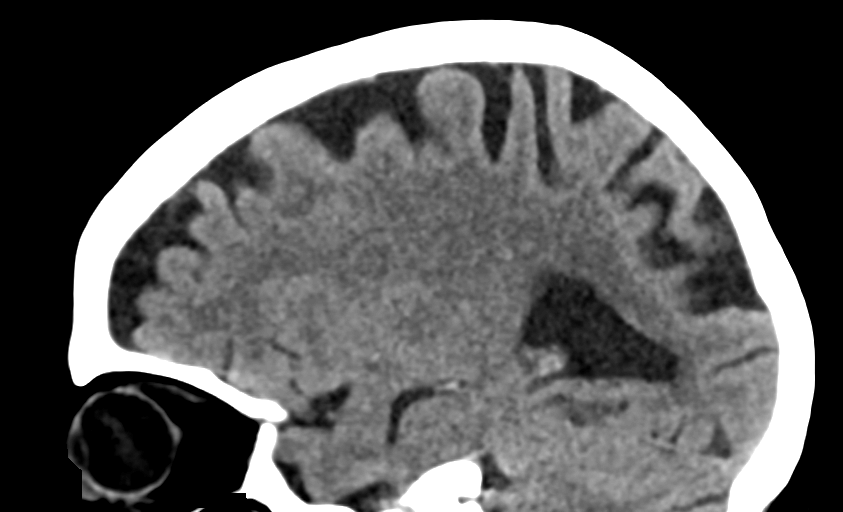
[im 28/56  brain]
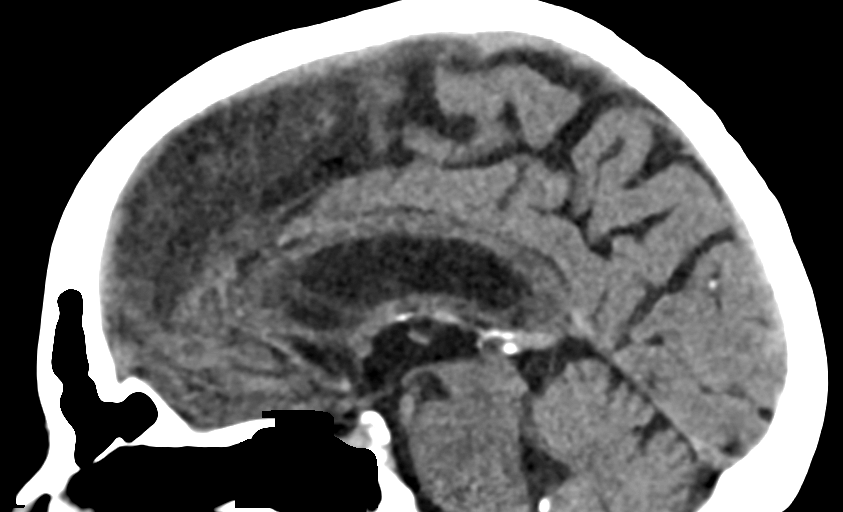
[im 37/56  brain]
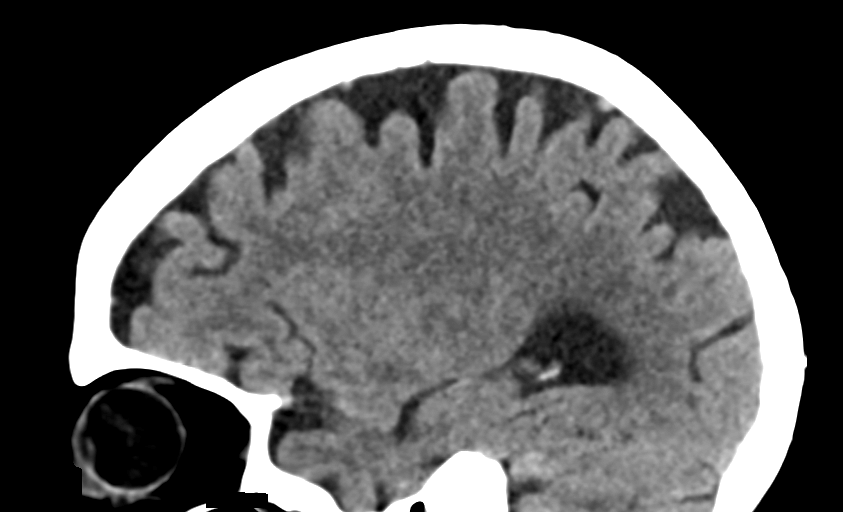

[13 of 47 positions shown; findings below may reference images not displayed]

FINDINGS: Brain: No evidence of acute infarction, hemorrhage, hydrocephalus,
extra-axial collection or mass lesion/mass effect. Mild to moderate
atrophy and chronic microvascular ischemic change are again seen.

Vascular: Atherosclerosis.

Skull: Intact.  No focal lesion.

Sinuses/Orbits: Status post cataract surgery.  Otherwise negative.

Other: None.
IMPRESSION: No acute abnormality.

Stable appearance of mild to moderate atrophy and microvascular
ischemic change.

## 2020-09-12 ENCOUNTER — Telehealth: Payer: Self-pay | Admitting: Physical Medicine and Rehabilitation

## 2020-09-12 NOTE — Telephone Encounter (Signed)
Pt called stating she needs an appt for lower back pain.   [phone]

## 2020-09-13 NOTE — Telephone Encounter (Signed)
Patient states pain is different. Scheduled for OV.

## 2020-09-13 NOTE — Telephone Encounter (Signed)
Left L3 TF 08/31/2019. Ok to repeat if helped, same problem/side, and no new injury vs OV?

## 2020-09-16 ENCOUNTER — Ambulatory Visit: Payer: Medicare PPO | Admitting: Physical Medicine and Rehabilitation

## 2020-09-16 ENCOUNTER — Other Ambulatory Visit: Payer: Self-pay

## 2020-09-16 ENCOUNTER — Encounter: Payer: Self-pay | Admitting: Physical Medicine and Rehabilitation

## 2020-09-16 VITALS — BP 137/74 | HR 102

## 2020-09-16 DIAGNOSIS — M48062 Spinal stenosis, lumbar region with neurogenic claudication: Secondary | ICD-10-CM | POA: Diagnosis not present

## 2020-09-16 DIAGNOSIS — M5442 Lumbago with sciatica, left side: Secondary | ICD-10-CM

## 2020-09-16 DIAGNOSIS — M47816 Spondylosis without myelopathy or radiculopathy, lumbar region: Secondary | ICD-10-CM | POA: Diagnosis not present

## 2020-09-16 DIAGNOSIS — G8929 Other chronic pain: Secondary | ICD-10-CM

## 2020-09-16 NOTE — Progress Notes (Signed)
Pt state lower back pain. Pt state standing and walking makes the pain worse. Pt state standing to clean or wash dishes makes the pain worse. Pt state she takes pain meds and over the counter pain meds to help ease her pain.  Numeric Pain Rating Scale and Functional Assessment Average Pain 10 Pain Right Now 7 My pain is constant, dull, and aching Pain is worse with: walking, standing, and some activites Pain improves with: medication   In the last MONTH (on 0-10 scale) has pain interfered with the following?  1. General activity like being  able to carry out your everyday physical activities such as walking, climbing stairs, carrying groceries, or moving a chair?  Rating(7)  2. Relation with others like being able to carry out your usual social activities and roles such as  activities at home, at work and in your community. Rating(8)  3. Enjoyment of life such that you have  been bothered by emotional problems such as feeling anxious, depressed or irritable?  Rating(9)

## 2020-09-16 NOTE — Progress Notes (Signed)
Sharon Bailey - 87 y.o. female MRN [id number]  Date of birth: June 22, 1930  Office Visit Note: Visit Date: 09/16/2020 PCP: Clovis Riley, L.August Saucer, MD Referred by: Clovis Riley, L.August Saucer, MD  Subjective: Chief Complaint  Patient presents with   Lower Back - Pain   HPI: Sharon Bailey is a 87 y.o. female who comes in today For evaluation of chronic, worsening and severe bilateral lower back pain radiating to left hip and leg. Patient reports chronic issues with same for many years, however states her bilateral lower back pain became worse approximately 6 weeks ago. Patient reports pain is exacerbated by prolonged standing and walking, describes as soreness and rates as 8 out of 10 currently. Patient reports some relief of pain with Tylenol and rest. Patient's lumbar MRI from 2021 exhibits moderate multi-factorial stenosis at L3-L4 and severe facet degeneration bilaterally at L4-L5. No high grade spinal stenosis noted. Patient had left L3-L4 transforaminal epidural steroid injection on 08/31/19 which has since resolved her radicular symptoms. Patient states the lower back pain is the main issue at this time.  Patient reports she is very anxious and is now having difficulty completing daily tasks at home due to increased pain. Patient denies focal weakness, numbness and tingling. Patient denies recent trauma or falls.   Review of Systems  Musculoskeletal:  Positive for back pain.  Neurological:  Negative for tingling, sensory change, focal weakness and weakness.  All other systems reviewed and are negative. Otherwise per HPI.  Assessment & Plan: Visit Diagnoses:    ICD-10-CM   1. Spondylosis without myelopathy or radiculopathy, lumbar region  M47.816 Ambulatory referral to Physical Medicine Rehab    2. Chronic left-sided low back pain with left-sided sciatica  M54.42    G89.29     3. Spinal stenosis of lumbar region with neurogenic claudication  M48.062     4. Facet arthropathy, lumbar  M47.816         Plan: Findings:  Chronic, worsening and severe bilateral lower back pain radiating to left hip and leg. Left L3-L4 transforaminal epidural steroid injection performed in 2021 to target multi-factorial stenosis provided significant relief and continues to sustain. Bilateral axial back pain remains and is causing patient severe pain. Patient's clinical presentation and exam are consistent with facet mediated pain. We believe the next step is to perform diagnostic and hopefully therapeutic bilateral L4-L5 facet joint injections. If patient does well with diagnostic facet blocks we would also consider radiofrequency ablation. We spoke with patient about radiofrequency ablation and also gave her educational literature to take home regarding this procedure.  Patient instructed to take 500 mg of Tylenol 4 times a day to help manage pain at home. Plan of care discussed with patient and she has no questions at this time. No red flag symptoms noted.    Meds & Orders: No orders of the defined types were placed in this encounter.   Orders Placed This Encounter  Procedures   Ambulatory referral to Physical Medicine Rehab    Follow-up: Return in about 1 week (around 09/23/2020) for Bilateral L4-5 facet joint/medial branch block..   Procedures: No procedures performed      Clinical History: Low back pain.  Left hip pain.   EXAM: MRI LUMBAR SPINE WITHOUT CONTRAST   TECHNIQUE: Multiplanar, multisequence MR imaging of the lumbar spine was performed. No intravenous contrast was administered.   COMPARISON:  Lumbar radiographs 09/21/2017   FINDINGS: Segmentation:  Normal   Alignment: 5 mm retrolisthesis L2-3. 3 mm retrolisthesis L4-5.  5 mm anterolisthesis L4-5.   Moderate lumbar scoliosis.   Vertebrae: Negative for fracture or mass. Hemangioma T12 vertebral body.   Conus medullaris and cauda equina: Conus extends to the L1 level. Conus and cauda equina appear normal.   Paraspinal and other soft  tissues: Negative for paraspinous mass or adenopathy.   Disc levels:   T12-L1: Negative   L1-2: Mild disc and mild facet degeneration.  Negative for stenosis   L2-3: Disc degeneration which is asymmetric on the left. Severe disc space narrowing on the left with associated endplate spurring. Moderate facet hypertrophy bilaterally. Moderate to severe subarticular and foraminal stenosis on the left. Mild spinal stenosis.   L3-4: Asymmetric disc degeneration on the right with disc space narrowing and spurring. Moderate to severe subarticular foraminal stenosis on the right. Moderate facet hypertrophy. Moderate spinal stenosis. Mild subarticular stenosis on the left   L4-5: Broad-based central disc protrusion. Severe facet degeneration bilaterally. Moderate subarticular stenosis on the right. Mild subarticular stenosis on the left. Mild spinal stenosis   L5-S1: Bilateral facet hypertrophy. No disc protrusion or neural impingement.   IMPRESSION: Lumbar scoliosis and multilevel degenerative changes above   Mild spinal stenosis and moderate to severe subarticular foraminal stenosis on the left at L2-3 due to spurring   Moderate to severe subarticular foraminal stenosis on the right due to spurring at L3-4. Moderate spinal stenosis   Central disc protrusion with mild spinal stenosis L4-5. Moderate subarticular stenosis on the right.     Electronically Signed   By: Marlan Palau M.D.   On: 08/07/2019 16:06   She reports that she has never smoked. She has never used smokeless tobacco. No results for input(s): HGBA1C, LABURIC in the last 8760 hours.  Objective:  VS:  HT:    WT:   BMI:     BP:137/74  HR:(!) 102bpm  TEMP: ( )  RESP:  Physical Exam HENT:     Head: Normocephalic and atraumatic.     Right Ear: Tympanic membrane normal.     Left Ear: Tympanic membrane normal.     Nose: Nose normal.     Mouth/Throat:     Mouth: Mucous membranes are moist.  Eyes:      Pupils: Pupils are equal, round, and reactive to light.  Cardiovascular:     Rate and Rhythm: Normal rate and regular rhythm.     Pulses: Normal pulses.  Pulmonary:     Effort: Pulmonary effort is normal.  Abdominal:     General: Abdomen is flat. There is no distension.  Musculoskeletal:        General: Tenderness present.     Cervical back: Normal range of motion and neck supple.     Comments: Pt is slow to rise from seated position to standing. Pain noted upon facet loading. Strong distal strength without clonus, no pain upon palpation of greater trochanters. No pain noted with bilateral hip flexion/extension. Sensation intact bilaterally. Walks independently, gait slow.   Skin:    General: Skin is warm and dry.     Capillary Refill: Capillary refill takes less than 2 seconds.  Neurological:     General: No focal deficit present.     Mental Status: She is alert.  Psychiatric:        Mood and Affect: Mood normal.    Ortho Exam  Imaging: No results found.  Past Medical/Family/Surgical/Social History: Medications & Allergies reviewed per EMR, new medications updated. Patient Active Problem List   Diagnosis Date Noted  Allergic rhinitis 06/18/2020   Cerebrovascular disease 06/18/2020   Chronic kidney disease, stage 3 unspecified (HCC) 06/18/2020   Hypothyroidism 06/18/2020   Increased frequency of urination 06/18/2020   Senile purpura (HCC) 06/18/2020   Venous insufficiency of both lower extremities 01/11/2020   History of CVA (cerebrovascular accident) 09/06/2019   Hypertensive emergency 06/12/2019   Acute CVA (cerebrovascular accident) (HCC) 06/11/2019   Nausea and vomiting 06/11/2019   AKI (acute kidney injury) (HCC) 06/11/2019   Metabolic acidosis 06/11/2019   Asymmetrical hearing loss of left ear 10/31/2018   Obstructive sleep apnea 10/31/2018   Sensorineural hearing loss (SNHL) of both ears 10/31/2018   Low back pain 09/21/2017   Chronic fatigue 02/05/2017    Atypical chest pain 02/05/2017   Hyperlipidemia 02/05/2017   Palpitations 04/18/2016   Essential hypertension 04/18/2016   Hypertensive heart disease without heart failure 04/18/2016   Status post foot surgery 10/11/2012   Gout attack 09/28/2012   Hammer toe of left foot 09/28/2012   Pain in joint, ankle and foot 09/28/2012   Past Medical History:  Diagnosis Date   Abnormal EKG    Collagenous colitis    Collagenous colitis    Diverticulitis    Diverticulosis    Fluttering heart    GERD (gastroesophageal reflux disease)    Gout    Gout attack 09/28/2012   Hammer toe of left foot 09/28/2012   Hyperlipidemia    Hypertension    Hypertensive emergency    Hypothyroidism    Irregular heart beat    Pain in joint, ankle and foot 09/28/2012   Status post foot surgery 10/11/2012   Thyroid disease    Family History  Problem Relation Age of Onset   Breast cancer Mother    Memory loss Father    Heart disease Sister    Rheumatic fever Sister    Lung disease Brother    Colon cancer Neg Hx    Past Surgical History:  Procedure Laterality Date   ABDOMINAL HYSTERECTOMY     ADENOIDECTOMY     CHOLECYSTECTOMY     Excision Ganglion Toe Left 09/29/2012   Lt #2 @ [facility]   Hammer toe repair Left 09/29/2012   Lt #2 @ [facility]   TONSILLECTOMY     Social History   Occupational History   Not on file  Tobacco Use   Smoking status: Never   Smokeless tobacco: Never  Vaping Use   Vaping Use: Never used  Substance and Sexual Activity   Alcohol use: No    Alcohol/week: 0.0 standard drinks   Drug use: No   Sexual activity: Not on file

## 2020-09-19 ENCOUNTER — Ambulatory Visit (INDEPENDENT_AMBULATORY_CARE_PROVIDER_SITE_OTHER): Payer: Medicare PPO | Admitting: Physical Medicine and Rehabilitation

## 2020-09-19 ENCOUNTER — Ambulatory Visit: Payer: Self-pay

## 2020-09-19 ENCOUNTER — Encounter: Payer: Self-pay | Admitting: Physical Medicine and Rehabilitation

## 2020-09-19 ENCOUNTER — Other Ambulatory Visit: Payer: Self-pay

## 2020-09-19 VITALS — BP 178/70 | HR 89

## 2020-09-19 DIAGNOSIS — M47816 Spondylosis without myelopathy or radiculopathy, lumbar region: Secondary | ICD-10-CM

## 2020-09-19 MED ORDER — METHYLPREDNISOLONE ACETATE 80 MG/ML IJ SUSP
80.0000 mg | Freq: Once | INTRAMUSCULAR | Status: AC
  Administered 2020-09-19: 80 mg

## 2020-09-19 MED ORDER — BUPIVACAINE HCL 0.5 % IJ SOLN
3.0000 mL | Freq: Once | INTRAMUSCULAR | Status: AC
  Administered 2020-09-19: 3 mL

## 2020-09-19 NOTE — Patient Instructions (Signed)

## 2020-09-19 NOTE — Progress Notes (Signed)
Pt state lower back pain. Pt state walking and standing makes the pain worse. Pt state she takes over the counter pain meds to help ease her pain.  Numeric Pain Rating Scale and Functional Assessment Average Pain 8   In the last MONTH (on 0-10 scale) has pain interfered with the following?  1. General activity like being  able to carry out your everyday physical activities such as walking, climbing stairs, carrying groceries, or moving a chair?  Rating(10)   +Driver, -BT, -Dye Allergies.

## 2020-09-22 NOTE — Procedures (Signed)
Lumbar Diagnostic Facet Joint Nerve Block with Fluoroscopic Guidance   Patient: Sharon Bailey      Date of Birth: [birth year] MRN: [id number] PCP: Clovis Riley, L.August Saucer, MD      Visit Date: 09/19/2020   Universal Protocol:    Date/Time: 09/18/225:37 PM  Consent Given By: the patient  Position: PRONE  Additional Comments: Vital signs were monitored before and after the procedure. Patient was prepped and draped in the usual sterile fashion. The correct patient, procedure, and site was verified.   Injection Procedure Details:   Procedure diagnoses:  1. Spondylosis without myelopathy or radiculopathy, lumbar region      Meds Administered:  Meds ordered this encounter  Medications   methylPREDNISolone acetate (DEPO-MEDROL) injection 80 mg   bupivacaine (MARCAINE) 0.5 % (with pres) injection 3 mL     Laterality: Bilateral  Location/Site: L4-L5, L3 and L4 medial branches  Needle: 5.0 in., 25 ga.  Short bevel or Quincke spinal needle  Needle Placement: Oblique pedical  Findings:   -Comments: There was excellent flow of contrast along the articular pillars without intravascular flow.  Procedure Details: The fluoroscope beam is vertically oriented in AP and then obliqued 15 to 20 degrees to the ipsilateral side of the desired nerve to achieve the "Scotty dog" appearance.  The skin over the target area of the junction of the superior articulating process and the transverse process (sacral ala if blocking the L5 dorsal rami) was locally anesthetized with a 1 ml volume of 1% Lidocaine without Epinephrine.  The spinal needle was inserted and advanced in a trajectory view down to the target.   After contact with periosteum and negative aspirate for blood and CSF, correct placement without intravascular or epidural spread was confirmed by injecting 0.5 ml. of Isovue-250.  A spot radiograph was obtained of this image.    Next, a 0.5 ml. volume of the injectate described above was injected.  The needle was then redirected to the other facet joint nerves mentioned above if needed.  Prior to the procedure, the patient was given a Pain Diary which was completed for baseline measurements.  After the procedure, the patient rated their pain every 30 minutes and will continue rating at this frequency for a total of 5 hours.  The patient has been asked to complete the Diary and return to [place] by mail, fax or hand delivered as soon as possible.   Additional Comments:  The patient tolerated the procedure well Dressing: 2 x 2 sterile gauze and Band-Aid    Post-procedure details: Patient was observed during the procedure. Post-procedure instructions were reviewed.  Patient left the clinic in stable condition.

## 2020-09-22 NOTE — Progress Notes (Signed)
Sharon Bailey - 87 y.o. female MRN [id number]  Date of birth: 1930/07/13  Office Visit Note: Visit Date: 09/19/2020 PCP: Clovis Riley, L.August Saucer, MD Referred by: Clovis Riley, L.August Saucer, MD  Subjective: Chief Complaint  Patient presents with   Lower Back - Pain   HPI:  Sharon Bailey is a 87 y.o. female who comes in today at the request of Ellin Goodie, FNP for planned Bilateral  L4-5 Lumbar facet/medial branch block with fluoroscopic guidance.  The patient has failed conservative care including home exercise, medications, time and activity modification.  This injection will be diagnostic and hopefully therapeutic.  Please see requesting physician notes for further details and justification.  Exam has shown concordant pain with facet joint loading.   ROS Otherwise per HPI.  Assessment & Plan: Visit Diagnoses:    ICD-10-CM   1. Spondylosis without myelopathy or radiculopathy, lumbar region  M47.816 XR C-ARM NO REPORT    Facet Injection    methylPREDNISolone acetate (DEPO-MEDROL) injection 80 mg    bupivacaine (MARCAINE) 0.5 % (with pres) injection 3 mL      Plan: No additional findings.   Meds & Orders:  Meds ordered this encounter  Medications   methylPREDNISolone acetate (DEPO-MEDROL) injection 80 mg   bupivacaine (MARCAINE) 0.5 % (with pres) injection 3 mL    Orders Placed This Encounter  Procedures   Facet Injection   XR C-ARM NO REPORT    Follow-up: Return if symptoms worsen or fail to improve.   Procedures: No procedures performed  Lumbar Diagnostic Facet Joint Nerve Block with Fluoroscopic Guidance   Patient: Sharon Bailey      Date of Birth: [birth year] MRN: [id number] PCP: Clovis Riley, L.August Saucer, MD      Visit Date: 09/19/2020   Universal Protocol:    Date/Time: 09/18/225:37 PM  Consent Given By: the patient  Position: PRONE  Additional Comments: Vital signs were monitored before and after the procedure. Patient was prepped and draped in the usual sterile  fashion. The correct patient, procedure, and site was verified.   Injection Procedure Details:   Procedure diagnoses:  1. Spondylosis without myelopathy or radiculopathy, lumbar region      Meds Administered:  Meds ordered this encounter  Medications   methylPREDNISolone acetate (DEPO-MEDROL) injection 80 mg   bupivacaine (MARCAINE) 0.5 % (with pres) injection 3 mL     Laterality: Bilateral  Location/Site: L4-L5, L3 and L4 medial branches  Needle: 5.0 in., 25 ga.  Short bevel or Quincke spinal needle  Needle Placement: Oblique pedical  Findings:   -Comments: There was excellent flow of contrast along the articular pillars without intravascular flow.  Procedure Details: The fluoroscope beam is vertically oriented in AP and then obliqued 15 to 20 degrees to the ipsilateral side of the desired nerve to achieve the "Scotty dog" appearance.  The skin over the target area of the junction of the superior articulating process and the transverse process (sacral ala if blocking the L5 dorsal rami) was locally anesthetized with a 1 ml volume of 1% Lidocaine without Epinephrine.  The spinal needle was inserted and advanced in a trajectory view down to the target.   After contact with periosteum and negative aspirate for blood and CSF, correct placement without intravascular or epidural spread was confirmed by injecting 0.5 ml. of Isovue-250.  A spot radiograph was obtained of this image.    Next, a 0.5 ml. volume of the injectate described above was injected. The needle was then redirected to the other facet joint  nerves mentioned above if needed.  Prior to the procedure, the patient was given a Pain Diary which was completed for baseline measurements.  After the procedure, the patient rated their pain every 30 minutes and will continue rating at this frequency for a total of 5 hours.  The patient has been asked to complete the Diary and return to [place] by mail, fax or hand delivered as soon as  possible.   Additional Comments:  The patient tolerated the procedure well Dressing: 2 x 2 sterile gauze and Band-Aid    Post-procedure details: Patient was observed during the procedure. Post-procedure instructions were reviewed.  Patient left the clinic in stable condition.   Clinical History: Low back pain.  Left hip pain.   EXAM: MRI LUMBAR SPINE WITHOUT CONTRAST   TECHNIQUE: Multiplanar, multisequence MR imaging of the lumbar spine was performed. No intravenous contrast was administered.   COMPARISON:  Lumbar radiographs 09/21/2017   FINDINGS: Segmentation:  Normal   Alignment: 5 mm retrolisthesis L2-3. 3 mm retrolisthesis L4-5. 5 mm anterolisthesis L4-5.   Moderate lumbar scoliosis.   Vertebrae: Negative for fracture or mass. Hemangioma T12 vertebral body.   Conus medullaris and cauda equina: Conus extends to the L1 level. Conus and cauda equina appear normal.   Paraspinal and other soft tissues: Negative for paraspinous mass or adenopathy.   Disc levels:   T12-L1: Negative   L1-2: Mild disc and mild facet degeneration.  Negative for stenosis   L2-3: Disc degeneration which is asymmetric on the left. Severe disc space narrowing on the left with associated endplate spurring. Moderate facet hypertrophy bilaterally. Moderate to severe subarticular and foraminal stenosis on the left. Mild spinal stenosis.   L3-4: Asymmetric disc degeneration on the right with disc space narrowing and spurring. Moderate to severe subarticular foraminal stenosis on the right. Moderate facet hypertrophy. Moderate spinal stenosis. Mild subarticular stenosis on the left   L4-5: Broad-based central disc protrusion. Severe facet degeneration bilaterally. Moderate subarticular stenosis on the right. Mild subarticular stenosis on the left. Mild spinal stenosis   L5-S1: Bilateral facet hypertrophy. No disc protrusion or neural impingement.   IMPRESSION: Lumbar scoliosis and  multilevel degenerative changes above   Mild spinal stenosis and moderate to severe subarticular foraminal stenosis on the left at L2-3 due to spurring   Moderate to severe subarticular foraminal stenosis on the right due to spurring at L3-4. Moderate spinal stenosis   Central disc protrusion with mild spinal stenosis L4-5. Moderate subarticular stenosis on the right.     Electronically Signed   By: Marlan Palau M.D.   On: 08/07/2019 16:06     Objective:  VS:  HT:    WT:   BMI:     BP:(!) 178/70  HR:89bpm  TEMP: ( )  RESP:  Physical Exam Vitals and nursing note reviewed.  Constitutional:      General: She is not in acute distress.    Appearance: Normal appearance. She is not ill-appearing.  HENT:     Head: Normocephalic and atraumatic.     Right Ear: External ear normal.     Left Ear: External ear normal.  Eyes:     Extraocular Movements: Extraocular movements intact.  Cardiovascular:     Rate and Rhythm: Normal rate.     Pulses: Normal pulses.  Pulmonary:     Effort: Pulmonary effort is normal. No respiratory distress.  Abdominal:     General: There is no distension.     Palpations: Abdomen is soft.  Musculoskeletal:        General: Tenderness present.     Cervical back: Neck supple.     Right lower leg: No edema.     Left lower leg: No edema.     Comments: Patient has good distal strength with no pain over the greater trochanters.  No clonus or focal weakness. Patient somewhat slow to rise from a seated position to full extension.  There is concordant low back pain with facet loading and lumbar spine extension rotation.  There are no definitive trigger points but the patient is somewhat tender across the lower back and PSIS.  There is no pain with hip rotation.   Skin:    Findings: No erythema, lesion or rash.  Neurological:     General: No focal deficit present.     Mental Status: She is alert and oriented to person, place, and time.     Sensory: No  sensory deficit.     Motor: No weakness or abnormal muscle tone.     Coordination: Coordination normal.  Psychiatric:        Mood and Affect: Mood normal.        Behavior: Behavior normal.     Imaging: No results found.

## 2020-09-25 ENCOUNTER — Ambulatory Visit: Payer: Medicare PPO | Admitting: Podiatry

## 2020-09-25 ENCOUNTER — Encounter: Payer: Self-pay | Admitting: Podiatry

## 2020-09-25 ENCOUNTER — Other Ambulatory Visit: Payer: Self-pay

## 2020-09-25 DIAGNOSIS — B351 Tinea unguium: Secondary | ICD-10-CM

## 2020-09-25 DIAGNOSIS — M79675 Pain in left toe(s): Secondary | ICD-10-CM

## 2020-09-25 DIAGNOSIS — I739 Peripheral vascular disease, unspecified: Secondary | ICD-10-CM

## 2020-09-25 DIAGNOSIS — M79674 Pain in right toe(s): Secondary | ICD-10-CM | POA: Diagnosis not present

## 2020-09-30 NOTE — Progress Notes (Signed)
  Subjective:  Patient ID: Sharon Bailey, female    DOB: 1930/02/07,  MRN: [id number]  Sharon Bailey presents to clinic today for thick, elongated toenails b/l feet which are tender when wearing enclosed shoe gear.  She states she had a fall on Friday. She sustained a bruise. Voices no foot issues.  PCP is Clovis Riley, L.August Saucer, MD , and last visit was June, 2022.  Allergies  Allergen Reactions   Sulfamethoxazole-Trimethoprim Diarrhea and Nausea Only    Other reaction(s): diarrhea    Review of Systems: Negative except as noted in the HPI. Objective:   Constitutional Sharon Bailey is a pleasant 87 y.o. Caucasian female, in NAD. AAO x 3.   Vascular Capillary refill time to digits immediate b/l. Palpable DP pulse(s) b/l lower extremities Nonpalpable PT pulse(s) b/l lower extremities. Pedal hair absent. Lower extremity skin temperature gradient within normal limits. No pain with calf compression b/l. No cyanosis or clubbing noted.  Neurologic Normal speech. Oriented to person, place, and time. Protective sensation intact 5/5 intact bilaterally with 10g monofilament b/l.  Dermatologic Pedal skin with normal turgor, texture and tone b/l lower extremities. No open wounds b/l lower extremities. No interdigital macerations b/l lower extremities. Toenails 1-5 b/l elongated, discolored, dystrophic, thickened, crumbly with subungual debris and tenderness to dorsal palpation.  Orthopedic: Normal muscle strength 5/5 to all lower extremity muscle groups bilaterally. Hammertoe(s) noted to the digits 2-4 b/l.   Radiographs: None Assessment:   1. Pain due to onychomycosis of toenails of both feet   2. PAD (peripheral artery disease) (HCC)    Plan:  Patient was evaluated and treated and all questions answered. Consent given for treatment as described below: -No new findings. No new orders. -Patient to continue soft, supportive shoe gear daily. -Toenails 1-5 b/l were debrided in length and girth with  sterile nail nippers and dremel without iatrogenic bleeding.  -Patient to report any pedal injuries to medical professional immediately. -Patient/POA to call should there be question/concern in the interim.  Return in about 3 months (around 12/25/2020).  Freddie Breech, DPM

## 2020-10-15 ENCOUNTER — Other Ambulatory Visit: Payer: Self-pay

## 2020-10-15 ENCOUNTER — Encounter (HOSPITAL_BASED_OUTPATIENT_CLINIC_OR_DEPARTMENT_OTHER): Payer: Self-pay | Admitting: Cardiology

## 2020-10-15 ENCOUNTER — Ambulatory Visit (HOSPITAL_BASED_OUTPATIENT_CLINIC_OR_DEPARTMENT_OTHER): Payer: Medicare PPO | Admitting: Cardiology

## 2020-10-15 VITALS — BP 154/66 | HR 80 | Ht 64.0 in | Wt 152.2 lb

## 2020-10-15 DIAGNOSIS — I872 Venous insufficiency (chronic) (peripheral): Secondary | ICD-10-CM | POA: Diagnosis not present

## 2020-10-15 DIAGNOSIS — I10 Essential (primary) hypertension: Secondary | ICD-10-CM

## 2020-10-15 DIAGNOSIS — Z79899 Other long term (current) drug therapy: Secondary | ICD-10-CM

## 2020-10-15 DIAGNOSIS — Z8673 Personal history of transient ischemic attack (TIA), and cerebral infarction without residual deficits: Secondary | ICD-10-CM

## 2020-10-15 MED ORDER — VALSARTAN 160 MG PO TABS: 160.0000 mg | ORAL_TABLET | Freq: Every day | ORAL | 3 refills | Status: DC

## 2020-10-15 NOTE — Patient Instructions (Signed)
Medication Instructions:  We are changing medications. When the new prescription arrives, STOP lisinopril, START valsartan. We will bring you back in a few weeks to check a blood test and see how your blood pressure is doing. If you can bring a log of blood pressures from home, that would be great.  STOP: Lisinopril 40 mg START: Valsartan 160 mg daily  *If you need a refill on your cardiac medications before your next appointment, please call your pharmacy*   Lab Work: Your provider has recommended lab work in 1 month (BMP). Please have this collected at [facility]. The lab is open 8:00 am - 4:30 pm. Please avoid 12:00p - 1:00p for lunch hour. You do not need an appointment. Please go to [address] [place], [place] [address]. This is in the Primary Care office on the 3rd floor, let them know you are there for blood work and they will direct you to the lab.   If you have labs (blood work) drawn today and your tests are completely normal, you will receive your results only by: MyChart Message (if you have MyChart) OR A paper copy in the mail If you have any lab test that is abnormal or we need to change your treatment, we will call you to review the results.   Testing/Procedures: None ordered today   Follow-Up: At [facility], you and your health needs are our priority.  As part of our continuing mission to provide you with exceptional heart care, we have created designated Provider Care Teams.  These Care Teams include your primary Cardiologist (physician) and Advanced Practice Providers (APPs -  Physician Assistants and Nurse Practitioners) who all work together to provide you with the care you need, when you need it.  We recommend signing up for the patient portal called "MyChart".  Sign up information is provided on this After Visit Summary.  MyChart is used to connect with patients for Virtual Visits (Telemedicine).  Patients are able to view  lab/test results, encounter notes, upcoming appointments, etc.  Non-urgent messages can be sent to your provider as well.   To learn more about what you can do with MyChart, go to [email or web address].    Your next appointment:   1 month(s)  The format for your next appointment:   In Person  Provider:   Gillian Shields, NP

## 2020-10-15 NOTE — Progress Notes (Signed)
Cardiology Office Note:    Date:  10/15/2020   ID:  Sharon Bailey, DOB Mar 17, 1930, MRN [id number]  PCP:  Sharon Graff.Marlou Sa, MD  Cardiologist:  Buford Dresser, MD  Referring MD: Sharon Graff.Marlou Sa, MD   CC: follow up  History of Present Illness:    Sharon Bailey is a 87 y.o. female with a hx of hypertension, hyperlipidemia, history of CVA and gout. I first met her 08/26/18.  Today: Overall, she states she has had a good year with no new complaints.  Of note, she usually gets anxious and has higher blood pressure readings in clinic. At home her blood pressure is averaging 145/75. She has not seen very low readings.  Recently she fell and injured her left knee and shin. She has a lesion medially superior to her left ankle.  Due to her medication and the need to drink plenty of water, she has urinary frequency that prevents her from having a restful sleep. Usually she does not fall asleep until closer to midnight. She notes that she has become dependent on melatonin and Advil PM to help her sleep.  She denies any palpitations, chest pain, or shortness of breath. No lightheadedness, headaches, syncope, orthopnea, or PND. Also has no exertional symptoms.   Past Medical History:  Diagnosis Date   Abnormal EKG    Collagenous colitis    Collagenous colitis    Diverticulitis    Diverticulosis    Fluttering heart    GERD (gastroesophageal reflux disease)    Gout    Gout attack 09/28/2012   Hammer toe of left foot 09/28/2012   Hyperlipidemia    Hypertension    Hypertensive emergency    Hypothyroidism    Irregular heart beat    Pain in joint, ankle and foot 09/28/2012   Status post foot surgery 10/11/2012   Thyroid disease    Past Surgical History:  Procedure Laterality Date   ABDOMINAL HYSTERECTOMY     ADENOIDECTOMY     CHOLECYSTECTOMY     Excision Ganglion Toe Left 09/29/2012   Lt #2 @ [place]   Hammer toe repair Left 09/29/2012   Lt #2 @ [facility]   TONSILLECTOMY        Current Meds  Medication Sig   allopurinol (ZYLOPRIM) 300 MG tablet Take 300 mg by mouth daily.   amLODipine (NORVASC) 5 MG tablet Take 1 tablet (5 mg total) by mouth daily.   budesonide (ENTOCORT EC) 3 MG 24 hr capsule Take 1 capsule (3 mg total) by mouth daily.   levothyroxine (SYNTHROID) 88 MCG tablet Take 88 mcg by mouth daily before breakfast.   simvastatin (ZOCOR) 40 MG tablet Take 40 mg by mouth daily.   valsartan (DIOVAN) 160 MG tablet Take 1 tablet (160 mg total) by mouth daily.   vitamin B-12 1000 MCG tablet Take 1 tablet (1,000 mcg total) by mouth daily.   [DISCONTINUED] lisinopril (ZESTRIL) 40 MG tablet Take 1 tablet (40 mg total) by mouth daily.     Allergies:   Sulfamethoxazole-trimethoprim   Social History   Tobacco Use   Smoking status: Never   Smokeless tobacco: Never  Vaping Use   Vaping Use: Never used  Substance Use Topics   Alcohol use: No    Alcohol/week: 0.0 standard drinks   Drug use: No     Family Hx: The patient's family history includes Breast cancer in her mother; Heart disease in her sister; Lung disease in her brother; Memory loss in her father; Rheumatic fever in  her sister. There is no history of Colon cancer.  ROS:   Please see the history of present illness.    (+) Fall (+) Lesion, left ankle (+) Urinary frequency (+) Insomnia All other systems reviewed and are negative.   Prior CV studies:   The following studies were reviewed today:  Echo 06/12/19 1. Left ventricular ejection fraction, by estimation, is 65 to 70%. The  left ventricle has normal function. The left ventricle has no regional  wall motion abnormalities. There is severe left ventricular hypertrophy.  Left ventricular diastolic parameters   are consistent with Grade I diastolic dysfunction (impaired relaxation).  Elevated left ventricular end-diastolic pressure.   2. Right ventricular systolic function is normal. The right ventricular  size is normal. There is mildly  elevated pulmonary artery systolic  pressure.   3. The mitral valve is grossly normal. Trivial mitral valve  regurgitation.   4. The aortic valve is tricuspid. Aortic valve regurgitation is trivial.  Mild aortic valve stenosis. Aortic valve area, by VTI measures 1.67 cm.  Aortic valve mean gradient measures 11.0 mmHg. Aortic valve Vmax measures  2.50 m/s.   5. The inferior vena cava is normal in size with <50% respiratory  variability, suggesting right atrial pressure of 8 mmHg.   Lexiscan Myoview 08/31/2016: Nuclear stress EF: 69%. There was no ST segment deviation noted during stress. The study is normal. This is a low risk study. The left ventricular ejection fraction is hyperdynamic (>65%).   Normal pharmacologic nuclear stress test with no evidence for prior infarct or ischemia.   Labs/Other Tests and Data Reviewed:    EKG:  EKG is personally reviewed. 10/15/2020: NSR, IVCD in left bundle pattern 10/22/2019: NSR, IVCD in left bundle pattern  Recent Labs: 10/22/2019: BUN 28; Creatinine, Ser 1.52; Hemoglobin 11.6; Platelets 193; Potassium 4.2; Sodium 142   Recent Lipid Panel Lab Results  Component Value Date/Time   CHOL 140 06/12/2019 07:11 AM   TRIG 105 06/12/2019 07:11 AM   HDL 47 06/12/2019 07:11 AM   CHOLHDL 3.0 06/12/2019 07:11 AM   LDLCALC 72 06/12/2019 07:11 AM    Wt Readings from Last 3 Encounters:  10/15/20 152 lb 3.2 oz (69 kg)  02/22/20 155 lb (70.3 kg)  01/11/20 153 lb 3.2 oz (69.5 kg)     Objective:    Vital Signs:  BP (!) 154/66 (BP Location: Left Arm, Patient Position: Sitting, Cuff Size: Normal)   Pulse 80   Ht _0  (1.626 m)   Wt 152 lb 3.2 oz (69 kg)   SpO2 99%   BMI 26.13 kg/m    GEN: Well nourished, well developed in no acute distress HEENT: Normal, moist mucous membranes NECK: No JVD CARDIAC: regular rhythm, normal S1 and S2, no rubs or gallops. 2/6 systolic murmur. VASCULAR: Radial and DP pulses 2+ bilaterally. No carotid  bruits RESPIRATORY:  Clear to auscultation without rales, wheezing or rhonchi  ABDOMEN: Soft, non-tender, non-distended MUSCULOSKELETAL:  Ambulates independently SKIN: Warm and dry. Lower extremity discoloration with mild nonpitting edema consistent with venous insufficiency  Lesion near left ankle bandaged but with dried blood surrounding. No erythema or warmth. NEUROLOGIC:  Alert and oriented x 3. No focal neuro deficits noted. PSYCHIATRIC:  Normal affect   ASSESSMENT & PLAN:    Hypertension:  -elevated today, even on recheck -changing lisinopril to valsartan. Recheck BP at visit in one month, get BMET. May need to increase valsartan dose if still elevated -continue amlodipine 5 mg daily. With LE edema,  will hold on increasing this today, but may need to increase in the future -has had carvedilol stopped, unclear when/why. HR 80 today, could restart low dose if needed in the future -has said she does not want diuretic/anything that makes her urinate more. We discussed chlorthalidone given her mild LE edema, but she does not wish to pursue  Chronic venous insufficiency -discussed compression, elevation   Chronic fatigue: -feels she is coping well, no changes -has had echo, monitor, and stress test in last two years (while having symptoms) that have not show cardiac etiology   History of CVA Cardiac risk counseling and primary prevention recommendations -see prior discussion re: statins. Has tolerated simvastatin, cannot raise dose due to amlodipine interaction -counseled on red flag warning signs -last lipids 06/12/19, LDL 72. Ideal goal <70 -on aspirin 81 mg daily   Plan for follow up: 1 month with APP.  Orders Placed This Encounter  Procedures   Basic metabolic panel   EKG 50-DTOI    Meds ordered this encounter  Medications   valsartan (DIOVAN) 160 MG tablet    Sig: Take 1 tablet (160 mg total) by mouth daily.    Dispense:  90 tablet    Refill:  3    Replaces lisinopril      Patient Instructions  Medication Instructions:  We are changing medications. When the new prescription arrives, STOP lisinopril, START valsartan. We will bring you back in a few weeks to check a blood test and see how your blood pressure is doing. If you can bring a log of blood pressures from home, that would be great.  STOP: Lisinopril 40 mg START: Valsartan 160 mg daily  *If you need a refill on your cardiac medications before your next appointment, please call your pharmacy*   Lab Work: Your provider has recommended lab work in 1 month (BMP). Please have this collected at [facility]. The lab is open 8:00 am - 4:30 pm. Please avoid 12:00p - 1:00p for lunch hour. You do not need an appointment. Please go to [address] [place], [place] [address]. This is in the Primary Care office on the 3rd floor, let them know you are there for blood work and they will direct you to the lab.   If you have labs (blood work) drawn today and your tests are completely normal, you will receive your results only by: [place] (if you have MyChart) OR A paper copy in the mail If you have any lab test that is abnormal or we need to change your treatment, we will call you to review the results.   Testing/Procedures: None ordered today   Follow-Up: At [facility], you and your health needs are our priority.  As part of our continuing mission to provide you with exceptional heart care, we have created designated Provider Care Teams.  These Care Teams include your primary Cardiologist (physician) and Advanced Practice Providers (APPs -  Physician Assistants and Nurse Practitioners) who all work together to provide you with the care you need, when you need it.  We recommend signing up for the patient portal called "MyChart".  Sign up information is provided on this After Visit Summary.  MyChart is used to connect with patients for Virtual Visits  (Telemedicine).  Patients are able to view lab/test results, encounter notes, upcoming appointments, etc.  Non-urgent messages can be sent to your provider as well.   To learn more about what you can do with MyChart,  go to [email or web address].    Your next appointment:   1 month(s)  The format for your next appointment:   In Person  Provider:   Laurann Montana, NP    [facility],acting as a scribe for Buford Dresser, MD.,have documented all relevant documentation on the behalf of Buford Dresser, MD,as directed by  Buford Dresser, MD while in the presence of Buford Dresser, MD.  I, Buford Dresser, MD, have reviewed all documentation for this visit. The documentation on 10/15/20 for the exam, diagnosis, procedures, and orders are all accurate and complete.   Signed, Buford Dresser, MD  10/15/2020 1:13 PM    [facility]

## 2020-10-22 ENCOUNTER — Telehealth: Payer: Self-pay | Admitting: Cardiology

## 2020-10-22 NOTE — Telephone Encounter (Signed)
Spoke to patient she is concerned she thinks Valsartan 160 mg is too much.She understands to stop Lisinopril 40 mg and start Valsartan 160 mg but she wants to ask Dr.Christopher about taking a lesser mg.Advised I will send message to Dr.Christopher for advice.

## 2020-10-22 NOTE — Telephone Encounter (Signed)
Pt c/o medication issue:  1. Name of Medication: valsartan (DIOVAN) 160 MG tablet  2. How are you currently taking this medication (dosage and times per day)? Not currently taking  3. Are you having a reaction (difficulty breathing--STAT)? no  4. What is your medication issue? Patient states she was told to discontinue her BP medication and start the valsartan. She has not started the medication, because she is concerned 160 mg may be too high.

## 2020-10-24 ENCOUNTER — Encounter (HOSPITAL_BASED_OUTPATIENT_CLINIC_OR_DEPARTMENT_OTHER): Payer: Self-pay

## 2020-10-28 NOTE — Telephone Encounter (Signed)
Spoke to patient Dr.Christopher's advice given.She will stop Lisinopril and start Valsartan 160 mg daily.

## 2020-10-28 NOTE — Telephone Encounter (Signed)
I would try the dose--max dose of of valsartan is actually 320 mg, so this is a medium dose. It may actually be less than the equivalent dose of lisinopril, but I wanted to start conservatively. I would stop lisinopril and start the valsartan as we discussed.

## 2020-11-20 ENCOUNTER — Ambulatory Visit (HOSPITAL_BASED_OUTPATIENT_CLINIC_OR_DEPARTMENT_OTHER): Payer: Medicare PPO | Admitting: Family

## 2020-11-20 NOTE — Progress Notes (Deleted)
Office Visit    Patient Name: Sharon Bailey Date of Encounter: 11/20/2020  PCP:  Alroy Dust, L.Marlou Sa, [facility]  Cardiologist:  Buford Dresser, MD  Advanced Practice Provider:  No care team member to display Electrophysiologist:  None     Chief Complaint    Sharon Bailey is a 87 y.o. female with a hx of hypertension, hyperlipemia, history of CVA, gout presents today for follow up of hypertension   Past Medical History    Past Medical History:  Diagnosis Date   Abnormal EKG    Collagenous colitis    Collagenous colitis    Diverticulitis    Diverticulosis    Fluttering heart    GERD (gastroesophageal reflux disease)    Gout    Gout attack 09/28/2012   Hammer toe of left foot 09/28/2012   Hyperlipidemia    Hypertension    Hypertensive emergency    Hypothyroidism    Irregular heart beat    Pain in joint, ankle and foot 09/28/2012   Status post foot surgery 10/11/2012   Thyroid disease    Past Surgical History:  Procedure Laterality Date   ABDOMINAL HYSTERECTOMY     ADENOIDECTOMY     CHOLECYSTECTOMY     Excision Ganglion Toe Left 09/29/2012   Lt #2 @ [place]   Hammer toe repair Left 09/29/2012   Lt #2 @ [place]   TONSILLECTOMY      Allergies  Allergies  Allergen Reactions   Sulfamethoxazole-Trimethoprim Diarrhea and Nausea Only    Other reaction(s): diarrhea    History of Present Illness    Sharon Bailey is a 87 y.o. female with a hx of hypertension, hyperlipidemia, history of CVA, gout, aortic stenosis, LVH last seen 10/15/20 by Dr. Harrell Gave.  Previous myoview 08/31/16 with LVEF 69% and no evidence of ischemia, low risk study. Echo 06/2019 with LVef 65-70%, no RWMA, severe LVH, gr1DD, elevated LVEDP, mildly elevated PASP, trivial MR/AI, mild aortic stenosis.   She was last seen 10/15/20 with home BP readings average 145/75. She had recently had a fall with injury to shin. Noted urinary frequency causing poor sleep. Her  Lisinopril was transitioned to Valsartan. Amlodipine was continued - did not increase dose due to LE edema. She politely declined chlorthalidone.  She presents today for follow up. ***   EKGs/Labs/Other Studies Reviewed:   The following studies were reviewed today: ***  EKG:  No EKG  Recent Labs: No results found for requested labs within last 8760 hours.  Recent Lipid Panel    Component Value Date/Time   CHOL 140 06/12/2019 0711   TRIG 105 06/12/2019 0711   HDL 47 06/12/2019 0711   CHOLHDL 3.0 06/12/2019 0711   VLDL 21 06/12/2019 0711   LDLCALC 72 06/12/2019 0711     Home Medications   No outpatient medications have been marked as taking for the 11/20/20 encounter (Appointment) with Loel Dubonnet, NP.     Review of Systems      All other systems reviewed and are otherwise negative except as noted above.  Physical Exam    VS:  There were no vitals taken for this visit. , BMI There is no height or weight on file to calculate BMI.  Wt Readings from Last 3 Encounters:  10/15/20 152 lb 3.2 oz (69 kg)  02/22/20 155 lb (70.3 kg)  01/11/20 153 lb 3.2 oz (69.5 kg)     GEN: Well nourished, well developed, in no acute  distress. HEENT: normal. Neck: Supple, no JVD, carotid bruits, or masses. Cardiac: ***RRR, no murmurs, rubs, or gallops. No clubbing, cyanosis, edema.  ***Radials/PT 2+ and equal bilaterally.  Respiratory:  ***Respirations regular and unlabored, clear to auscultation bilaterally. GI: Soft, nontender, nondistended. MS: No deformity or atrophy. Skin: Warm and dry, no rash. Neuro:  Strength and sensation are intact. Psych: Normal affect.  Assessment & Plan    HTN -  Venous insufficiency -  Mild aortic stenosis - Noted by echo 06/2019. *** Chronic fatigue - Monitor, echo, and stress test within last 2 years with no significant findings which were performed during symptoms of fatigue. No evidence of cardiac etiology. History of CVA -   Disposition:  Follow up {follow up:15908} with Jodelle Red, MD or APP.  Signed, Alver Sorrow, NP 11/20/2020, 12:12 PM [facility]

## 2020-11-21 LAB — BASIC METABOLIC PANEL
BUN/Creatinine Ratio: 18 (ref 12–28)
BUN: 23 mg/dL (ref 8–27)
CO2: 19 mmol/L — ABNORMAL LOW (ref 20–29)
Calcium: 9.1 mg/dL (ref 8.7–10.3)
Chloride: 111 mmol/L — ABNORMAL HIGH (ref 96–106)
Creatinine, Ser: 1.31 mg/dL — ABNORMAL HIGH (ref 0.57–1.00)
Glucose: 109 mg/dL — ABNORMAL HIGH (ref 70–99)
Potassium: 4.4 mmol/L (ref 3.5–5.2)
Sodium: 145 mmol/L — ABNORMAL HIGH (ref 134–144)
eGFR: 39 mL/min/{1.73_m2} — ABNORMAL LOW (ref >59)

## 2020-11-26 ENCOUNTER — Telehealth: Payer: Self-pay | Admitting: Physical Medicine and Rehabilitation

## 2020-11-26 NOTE — Telephone Encounter (Signed)
Pt called requesting a call back from [facility]. Pt states she need refill of her medications and need to speak to [facility]. Pt phone number is [phone] or [phone].

## 2020-11-27 NOTE — Telephone Encounter (Signed)
IC LM for patient. Need to know what medications she is requesting refill on. Asked her to please call us back with information.

## 2020-12-09 DIAGNOSIS — H31091 Other chorioretinal scars, right eye: Secondary | ICD-10-CM | POA: Diagnosis not present

## 2020-12-09 DIAGNOSIS — Z961 Presence of intraocular lens: Secondary | ICD-10-CM | POA: Diagnosis not present

## 2020-12-09 DIAGNOSIS — H353132 Nonexudative age-related macular degeneration, bilateral, intermediate dry stage: Secondary | ICD-10-CM | POA: Diagnosis not present

## 2020-12-09 DIAGNOSIS — H35363 Drusen (degenerative) of macula, bilateral: Secondary | ICD-10-CM | POA: Diagnosis not present

## 2020-12-09 DIAGNOSIS — H353221 Exudative age-related macular degeneration, left eye, with active choroidal neovascularization: Secondary | ICD-10-CM | POA: Diagnosis not present

## 2020-12-09 DIAGNOSIS — H35453 Secondary pigmentary degeneration, bilateral: Secondary | ICD-10-CM | POA: Diagnosis not present

## 2020-12-12 ENCOUNTER — Telehealth: Payer: Self-pay

## 2020-12-12 DIAGNOSIS — M79605 Pain in left leg: Secondary | ICD-10-CM

## 2020-12-12 DIAGNOSIS — M25552 Pain in left hip: Secondary | ICD-10-CM

## 2020-12-12 MED ORDER — TRAMADOL HCL 50 MG PO TABS
50.0000 mg | ORAL_TABLET | Freq: Three times a day (TID) | ORAL | 0 refills | Status: AC | PRN
Start: 2020-12-12 — End: 2020-12-17

## 2020-12-12 NOTE — Telephone Encounter (Signed)
Pt called into the office and would like to know if Dr. Alvester Morin can send her anything in for pain. And she would also like to know when she can get another injection.   Please advise

## 2020-12-13 NOTE — Telephone Encounter (Signed)
Contacted patient and she has been made aware that tramadol has been sent into pharmacy. Patient has also been made a follow up appointment from medial branch block injection

## 2020-12-19 ENCOUNTER — Other Ambulatory Visit: Payer: Self-pay

## 2020-12-19 ENCOUNTER — Ambulatory Visit: Payer: Medicare PPO | Admitting: Physical Medicine and Rehabilitation

## 2020-12-19 ENCOUNTER — Encounter: Payer: Self-pay | Admitting: Physical Medicine and Rehabilitation

## 2020-12-19 VITALS — BP 134/72 | HR 94

## 2020-12-19 DIAGNOSIS — M48062 Spinal stenosis, lumbar region with neurogenic claudication: Secondary | ICD-10-CM

## 2020-12-19 DIAGNOSIS — M47816 Spondylosis without myelopathy or radiculopathy, lumbar region: Secondary | ICD-10-CM | POA: Diagnosis not present

## 2020-12-19 NOTE — Progress Notes (Signed)
Pt state lower back pain. Pt state walking and standing makes the pain worse. Pt state she takes pain meds to help ease her pain.  Numeric Pain Rating Scale and Functional Assessment Average Pain 10 Pain Right Now 8 My pain is intermittent, dull, and aching Pain is worse with: walking, standing, and some activites Pain improves with: medication and injections   In the last MONTH (on 0-10 scale) has pain interfered with the following?  1. General activity like being  able to carry out your everyday physical activities such as walking, climbing stairs, carrying groceries, or moving a chair?  Rating(7)  2. Relation with others like being able to carry out your usual social activities and roles such as  activities at home, at work and in your community. Rating(8)  3. Enjoyment of life such that you have  been bothered by emotional problems such as feeling anxious, depressed or irritable?  Rating(9)

## 2020-12-19 NOTE — Progress Notes (Signed)
Sharon Bailey - 87 y.o. female MRN [id number]  Date of birth: 29-Jul-1930  Office Visit Note: Visit Date: 12/19/2020 PCP: Alroy Dust, L.Marlou Sa, MD Referred by: Alroy Dust, L.Marlou Sa, MD  Subjective: Chief Complaint  Patient presents with   Lower Back - Pain   HPI: Sharon Bailey is a 87 y.o. female who comes in today for evaluation of chronic, worsening and severe bilateral lower back pain. Patient reports pain has been ongoing for several months. Patient had bilateral L4-L5 facet joint/medial branch blocks on 09/19/2020 and reports greater than 80% pain relief for approximately 2 weeks. Patient reports pain is exacerbated by walking and standing. She describes pain as constant soreness, currently rates as 9 out of 10. Patient reports some relief of pain with home exercise program, rest and use of OTC medications as needed.  Patient's lumbar MRI from 2021 exhibits moderate lumbar scoliosis, moderate multi-factorial stenosis at L3-L4, severe facet degeneration bilaterally and central disc protrusion with mild spinal canal stenosis at L4-L5. Patient states she is a very active person but is now having difficulty completing daily tasks due to pain. Patient recently started taking Tramadol prescribed by Dr. Magnus Sinning and reports this does help to alleviate severe pain and allows her to sleep at night. Patient denies focal weakness, numbness and tingling. Patient denies recent trauma or falls.   Review of Systems  Musculoskeletal:  Positive for back pain.  Neurological:  Negative for tingling, sensory change, focal weakness and weakness.  All other systems reviewed and are negative. Otherwise per HPI.  Assessment & Plan: Visit Diagnoses:    ICD-10-CM   1. Spondylosis without myelopathy or radiculopathy, lumbar region  M47.816 Ambulatory referral to Physical Medicine Rehab    2. Spinal stenosis of lumbar region with neurogenic claudication  M48.062     3. Facet arthropathy, lumbar  M47.816  Ambulatory referral to Physical Medicine Rehab       Plan: Findings:  Chronic, worsening and severe bilateral axial back pain. No radicular symptoms noted. Patient continues to have excruciating pain despite good conservative therapies such as home exercise regimen, rest and use of medications. Patient's clinical presentation and exam are consistent with facet mediated pain. Patient's lumbar MRI exhibits severe facet degeneration bilaterally at L4-L5. She does have pain upon facet loading. We believe the next step is to repeat bilateral L4-L5 facet joint/medial branch blocks under fluoroscopic guidance. If patient get good pain relief with facet joint/medial branch blocks we did speak with her about the possibility of longer sustained pain relief with radiofrequency ablation procedure. We did provide patient with educational material regarding radiofrequency ablation today to take home and review. Patient instructed to take Tramadol as prescribed for severe pain. Patient is agreeable with plan of care and has no questions at this time. No red flag symptoms noted.    Meds & Orders: No orders of the defined types were placed in this encounter.   Orders Placed This Encounter  Procedures   Ambulatory referral to Physical Medicine Rehab    Follow-up: Return for Bilateral L4-L5 facet joint/medial branch blocks.   Procedures: No procedures performed      Clinical History: Low back pain.  Left hip pain.   EXAM: MRI LUMBAR SPINE WITHOUT CONTRAST   TECHNIQUE: Multiplanar, multisequence MR imaging of the lumbar spine was performed. No intravenous contrast was administered.   COMPARISON:  Lumbar radiographs 09/21/2017   FINDINGS: Segmentation:  Normal   Alignment: 5 mm retrolisthesis L2-3. 3 mm retrolisthesis L4-5. 5 mm anterolisthesis  L4-5.   Moderate lumbar scoliosis.   Vertebrae: Negative for fracture or mass. Hemangioma T12 vertebral body.   Conus medullaris and cauda equina: Conus  extends to the L1 level. Conus and cauda equina appear normal.   Paraspinal and other soft tissues: Negative for paraspinous mass or adenopathy.   Disc levels:   T12-L1: Negative   L1-2: Mild disc and mild facet degeneration.  Negative for stenosis   L2-3: Disc degeneration which is asymmetric on the left. Severe disc space narrowing on the left with associated endplate spurring. Moderate facet hypertrophy bilaterally. Moderate to severe subarticular and foraminal stenosis on the left. Mild spinal stenosis.   L3-4: Asymmetric disc degeneration on the right with disc space narrowing and spurring. Moderate to severe subarticular foraminal stenosis on the right. Moderate facet hypertrophy. Moderate spinal stenosis. Mild subarticular stenosis on the left   L4-5: Broad-based central disc protrusion. Severe facet degeneration bilaterally. Moderate subarticular stenosis on the right. Mild subarticular stenosis on the left. Mild spinal stenosis   L5-S1: Bilateral facet hypertrophy. No disc protrusion or neural impingement.   IMPRESSION: Lumbar scoliosis and multilevel degenerative changes above   Mild spinal stenosis and moderate to severe subarticular foraminal stenosis on the left at L2-3 due to spurring   Moderate to severe subarticular foraminal stenosis on the right due to spurring at L3-4. Moderate spinal stenosis   Central disc protrusion with mild spinal stenosis L4-5. Moderate subarticular stenosis on the right.     Electronically Signed   By: Marlan Palau M.D.   On: 08/07/2019 16:06   She reports that she has never smoked. She has never used smokeless tobacco. No results for input(s): HGBA1C, LABURIC in the last 8760 hours.  Objective:  VS:  HT:     WT:    BMI:      BP:134/72   HR:94bpm   TEMP: ( )   RESP:  Physical Exam Vitals and nursing note reviewed.  HENT:     Head: Normocephalic and atraumatic.     Right Ear: External ear normal.     Left Ear: External  ear normal.     Nose: Nose normal.     Mouth/Throat:     Mouth: Mucous membranes are moist.  Eyes:     Extraocular Movements: Extraocular movements intact.  Cardiovascular:     Rate and Rhythm: Normal rate.     Pulses: Normal pulses.  Pulmonary:     Effort: Pulmonary effort is normal.  Abdominal:     General: Abdomen is flat. There is no distension.  Musculoskeletal:        General: Tenderness present.     Cervical back: Normal range of motion.     Comments: Pt is slow to rise from seated position to standing. Concordant low back pain with facet loading, lumbar spine extension and rotation. Strong distal strength without clonus, no pain upon palpation of greater trochanters. Sensation intact bilaterally. Walks independently, gait steady.    Skin:    General: Skin is warm and dry.     Capillary Refill: Capillary refill takes less than 2 seconds.  Neurological:     General: No focal deficit present.     Mental Status: She is alert and oriented to person, place, and time.  Psychiatric:        Mood and Affect: Mood normal.    Ortho Exam  Imaging: No results found.  Past Medical/Family/Surgical/Social History: Medications & Allergies reviewed per EMR, new medications updated. Patient Active Problem List  Diagnosis Date Noted   Allergic rhinitis 06/18/2020   Cerebrovascular disease 06/18/2020   Chronic kidney disease, stage 3 unspecified ([place]) 06/18/2020   Hypothyroidism 06/18/2020   Increased frequency of urination 06/18/2020   Senile purpura ([place]) 06/18/2020   Venous insufficiency of both lower extremities 01/11/2020   History of CVA (cerebrovascular accident) 09/06/2019   Hypertensive emergency 06/12/2019   Acute CVA (cerebrovascular accident) ([place]) 06/11/2019   Nausea and vomiting 06/11/2019   AKI (acute kidney injury) ([place]) 123456   Metabolic acidosis 123456   Asymmetrical hearing loss of left ear 10/31/2018   Obstructive sleep apnea 10/31/2018    Sensorineural hearing loss (SNHL) of both ears 10/31/2018   Low back pain 09/21/2017   Chronic fatigue 02/05/2017   Atypical chest pain 02/05/2017   Hyperlipidemia 02/05/2017   Palpitations 04/18/2016   Essential hypertension 04/18/2016   Hypertensive heart disease without heart failure 04/18/2016   Status post foot surgery 10/11/2012   Gout attack 09/28/2012   Hammer toe of left foot 09/28/2012   Pain in joint, ankle and foot 09/28/2012   Past Medical History:  Diagnosis Date   Abnormal EKG    Collagenous colitis    Collagenous colitis    Diverticulitis    Diverticulosis    Fluttering heart    GERD (gastroesophageal reflux disease)    Gout    Gout attack 09/28/2012   Hammer toe of left foot 09/28/2012   Hyperlipidemia    Hypertension    Hypertensive emergency    Hypothyroidism    Irregular heart beat    Pain in joint, ankle and foot 09/28/2012   Status post foot surgery 10/11/2012   Thyroid disease    Family History  Problem Relation Age of Onset   Breast cancer Mother    Memory loss Father    Heart disease Sister    Rheumatic fever Sister    Lung disease Brother    Colon cancer Neg Hx    Past Surgical History:  Procedure Laterality Date   ABDOMINAL HYSTERECTOMY     ADENOIDECTOMY     CHOLECYSTECTOMY     Excision Ganglion Toe Left 09/29/2012   Lt #2 @ [place]   Hammer toe repair Left 09/29/2012   Lt #2 @ [place]   TONSILLECTOMY     Social History   Occupational History   Not on file  Tobacco Use   Smoking status: Never   Smokeless tobacco: Never  Vaping Use   Vaping Use: Never used  Substance and Sexual Activity   Alcohol use: No    Alcohol/week: 0.0 standard drinks   Drug use: No   Sexual activity: Not on file

## 2021-01-07 ENCOUNTER — Other Ambulatory Visit: Payer: Self-pay

## 2021-01-07 ENCOUNTER — Encounter: Payer: Self-pay | Admitting: Podiatry

## 2021-01-07 ENCOUNTER — Ambulatory Visit: Payer: Medicare PPO | Admitting: Podiatry

## 2021-01-07 DIAGNOSIS — I739 Peripheral vascular disease, unspecified: Secondary | ICD-10-CM | POA: Diagnosis not present

## 2021-01-07 DIAGNOSIS — B351 Tinea unguium: Secondary | ICD-10-CM

## 2021-01-07 DIAGNOSIS — M79674 Pain in right toe(s): Secondary | ICD-10-CM | POA: Diagnosis not present

## 2021-01-07 DIAGNOSIS — M79675 Pain in left toe(s): Secondary | ICD-10-CM | POA: Diagnosis not present

## 2021-01-09 ENCOUNTER — Encounter: Payer: Self-pay | Admitting: Physical Medicine and Rehabilitation

## 2021-01-09 ENCOUNTER — Ambulatory Visit (INDEPENDENT_AMBULATORY_CARE_PROVIDER_SITE_OTHER): Payer: Medicare PPO | Admitting: Physical Medicine and Rehabilitation

## 2021-01-09 ENCOUNTER — Other Ambulatory Visit: Payer: Self-pay

## 2021-01-09 ENCOUNTER — Ambulatory Visit: Payer: Self-pay

## 2021-01-09 VITALS — BP 138/59 | HR 103

## 2021-01-09 DIAGNOSIS — M47816 Spondylosis without myelopathy or radiculopathy, lumbar region: Secondary | ICD-10-CM

## 2021-01-09 MED ORDER — BUPIVACAINE HCL 0.25 % IJ SOLN
2.0000 mL | Freq: Once | INTRAMUSCULAR | Status: AC
  Administered 2021-01-09: 2 mL

## 2021-01-09 NOTE — Patient Instructions (Signed)

## 2021-01-09 NOTE — Progress Notes (Signed)
Pt state lower back pain. Pt state walking and standing makes the pain worse. Pt state she takes pain meds to help ease her pain. ° °Numeric Pain Rating Scale and Functional Assessment °Average Pain 8 ° ° °In the last MONTH (on 0-10 scale) has pain interfered with the following? ° °1. General activity like being  able to carry out your everyday physical activities such as walking, climbing stairs, carrying groceries, or moving a chair?  °Rating(10) ° ° °+Driver, -BT, -Dye Allergies. ° °

## 2021-01-11 NOTE — Progress Notes (Signed)
°  Subjective:  Patient ID: Sharon Bailey, female    DOB: [birth year],  MRN: [id number]  88 y.o. female presents for at risk foot care. Patient has h/o PAD and painful thick toenails that are difficult to trim. Pain interferes with ambulation. Aggravating factors include wearing enclosed shoe gear. Pain is relieved with periodic professional debridement.  She voices no new pedal problems on today's visit.  PCP is Clovis Riley, L.August Saucer, MD , and last visit was June, 2022.  Allergies  Allergen Reactions   Sulfamethoxazole-Trimethoprim Diarrhea and Nausea Only    Other reaction(s): diarrhea    Review of Systems: Negative except as noted in the HPI.   Objective:  Vascular Examination: Capillary refill time to digits immediate b/l. Palpable DP pulse(s) b/l LE. Diminished PT pulse(s) b/l LE. Pedal hair absent. No pain with calf compression b/l. No ischemia or gangrene noted b/l LE. No cyanosis or clubbing noted b/l LE.  Neurological Examination: Protective sensation intact 5/5 intact bilaterally with 10g monofilament b/l.  Dermatological Examination: Pedal skin warm and supple b/l.  No open wounds b/l. No interdigital macerations. Toenails 1-5 b/l elongated, thickened, discolored with subungual debris. +Tenderness with dorsal palpation of nailplates.No hyperkeratotic nor porokeratotic lesions noted b/l.  Musculoskeletal Examination: Muscle strength 5/5 to all lower extremity muscle groups bilaterally. Hammertoe deformity noted 2-5 b/l.  Radiographs: None   Assessment:   1. Pain due to onychomycosis of toenails of both feet   2. PAD (peripheral artery disease) (HCC)    Plan:  -No new findings. No new orders. -Mycotic toenails 1-5 bilaterally were debrided in length and girth with sterile nail nippers and dremel without incident. -Patient/POA to call should there be question/concern in the interim.  Return in about 4 months (around 05/07/2021).  Freddie Breech, DPM

## 2021-01-13 ENCOUNTER — Other Ambulatory Visit: Payer: Self-pay | Admitting: Physical Medicine and Rehabilitation

## 2021-01-13 DIAGNOSIS — N183 Chronic kidney disease, stage 3 unspecified: Secondary | ICD-10-CM | POA: Diagnosis not present

## 2021-01-13 DIAGNOSIS — E7800 Pure hypercholesterolemia, unspecified: Secondary | ICD-10-CM | POA: Diagnosis not present

## 2021-01-13 DIAGNOSIS — E039 Hypothyroidism, unspecified: Secondary | ICD-10-CM | POA: Diagnosis not present

## 2021-01-13 DIAGNOSIS — I679 Cerebrovascular disease, unspecified: Secondary | ICD-10-CM | POA: Diagnosis not present

## 2021-01-13 DIAGNOSIS — M47816 Spondylosis without myelopathy or radiculopathy, lumbar region: Secondary | ICD-10-CM

## 2021-01-13 DIAGNOSIS — M109 Gout, unspecified: Secondary | ICD-10-CM | POA: Diagnosis not present

## 2021-01-13 DIAGNOSIS — Z0000 Encounter for general adult medical examination without abnormal findings: Secondary | ICD-10-CM | POA: Diagnosis not present

## 2021-01-13 DIAGNOSIS — J309 Allergic rhinitis, unspecified: Secondary | ICD-10-CM | POA: Diagnosis not present

## 2021-01-13 DIAGNOSIS — D692 Other nonthrombocytopenic purpura: Secondary | ICD-10-CM | POA: Diagnosis not present

## 2021-01-13 DIAGNOSIS — I10 Essential (primary) hypertension: Secondary | ICD-10-CM | POA: Diagnosis not present

## 2021-01-31 ENCOUNTER — Telehealth: Payer: Self-pay | Admitting: Physical Medicine and Rehabilitation

## 2021-01-31 MED ORDER — TRAMADOL HCL 50 MG PO TABS: 50.0000 mg | ORAL_TABLET | Freq: Two times a day (BID) | ORAL | 0 refills | Status: AC | PRN

## 2021-01-31 NOTE — Telephone Encounter (Signed)
Pt called requesting a refill of tramadol. Please send to pharmacy on file. Pt is asking for a call when medication has been sent in. Pt phone number is [phone].

## 2021-02-03 ENCOUNTER — Telehealth: Payer: Self-pay

## 2021-02-03 DIAGNOSIS — H353221 Exudative age-related macular degeneration, left eye, with active choroidal neovascularization: Secondary | ICD-10-CM | POA: Diagnosis not present

## 2021-02-03 MED ORDER — BUDESONIDE 3 MG PO CPEP: 3.0000 mg | ORAL_CAPSULE | Freq: Every day | ORAL | 3 refills | Status: DC

## 2021-02-03 NOTE — Procedures (Signed)
Lumbar Diagnostic Facet Joint Nerve Block with Fluoroscopic Guidance   Patient: Sharon Bailey      Date of Birth: 12-08-1930 MRN: [id number] PCP: Clovis Riley, L.August Saucer, MD      Visit Date: 01/09/2021   Universal Protocol:    Date/Time: 01/30/235:29 AM  Consent Given By: the patient  Position: PRONE  Additional Comments: Vital signs were monitored before and after the procedure. Patient was prepped and draped in the usual sterile fashion. The correct patient, procedure, and site was verified.   Injection Procedure Details:   Procedure diagnoses:  1. Spondylosis without myelopathy or radiculopathy, lumbar region      Meds Administered:  Meds ordered this encounter  Medications   bupivacaine (MARCAINE) 0.25 % (with pres) injection 2 mL     Laterality: Bilateral  Location/Site: L4-L5, L3 and L4 medial branches  Needle: 5.0 in., 25 ga.  Short bevel or Quincke spinal needle  Needle Placement: Oblique pedical  Findings:   -Comments: There was excellent flow of contrast along the articular pillars without intravascular flow.  Procedure Details: The fluoroscope beam is vertically oriented in AP and then obliqued 15 to 20 degrees to the ipsilateral side of the desired nerve to achieve the Scotty dog appearance.  The skin over the target area of the junction of the superior articulating process and the transverse process (sacral ala if blocking the L5 dorsal rami) was locally anesthetized with a 1 ml volume of 1% Lidocaine without Epinephrine.  The spinal needle was inserted and advanced in a trajectory view down to the target.   After contact with periosteum and negative aspirate for blood and CSF, correct placement without intravascular or epidural spread was confirmed by injecting 0.5 ml. of Isovue-250.  A spot radiograph was obtained of this image.    Next, a 0.5 ml. volume of the injectate described above was injected. The needle was then redirected to the other facet joint  nerves mentioned above if needed.  Prior to the procedure, the patient was given a Pain Diary which was completed for baseline measurements.  After the procedure, the patient rated their pain every 30 minutes and will continue rating at this frequency for a total of 5 hours.  The patient has been asked to complete the Diary and return to [place] by mail, fax or hand delivered as soon as possible.   Additional Comments:  The patient tolerated the procedure well Dressing: 2 x 2 sterile gauze and Band-Aid    Post-procedure details: Patient was observed during the procedure. Post-procedure instructions were reviewed.  Patient left the clinic in stable condition.

## 2021-02-03 NOTE — Progress Notes (Signed)
Sharon Bailey - 88 y.o. female MRN [id number]  Date of birth: [birth year]  Office Visit Note: Visit Date: 01/09/2021 PCP: Clovis Riley, L.August Saucer, MD Referred by: Clovis Riley, L.August Saucer, MD  Subjective: Chief Complaint  Patient presents with   Lower Back - Pain   HPI:  Sharon Bailey is a 88 y.o. female who comes in today at the request of Ellin Goodie, FNP for planned Bilateral  L4-5 Lumbar facet/medial branch block with fluoroscopic guidance.  The patient has failed conservative care including home exercise, medications, time and activity modification.  This injection will be diagnostic and hopefully therapeutic.  Please see requesting physician notes for further details and justification.  Exam has shown concordant pain with facet joint loading.   ROS Otherwise per HPI.  Assessment & Plan: Visit Diagnoses:    ICD-10-CM   1. Spondylosis without myelopathy or radiculopathy, lumbar region  M47.816 XR C-ARM NO REPORT    Facet Injection    bupivacaine (MARCAINE) 0.25 % (with pres) injection 2 mL      Plan: No additional findings.   Meds & Orders:  Meds ordered this encounter  Medications   bupivacaine (MARCAINE) 0.25 % (with pres) injection 2 mL    Orders Placed This Encounter  Procedures   Facet Injection   XR C-ARM NO REPORT    Follow-up: Return for Review Pain Diary.   Procedures: No procedures performed  Lumbar Diagnostic Facet Joint Nerve Block with Fluoroscopic Guidance   Patient: Sharon Bailey      Date of Birth: 1930-08-18 MRN: [id number] PCP: Clovis Riley, L.August Saucer, MD      Visit Date: 01/09/2021   Universal Protocol:    Date/Time: 01/30/235:29 AM  Consent Given By: the patient  Position: PRONE  Additional Comments: Vital signs were monitored before and after the procedure. Patient was prepped and draped in the usual sterile fashion. The correct patient, procedure, and site was verified.   Injection Procedure Details:   Procedure diagnoses:  1. Spondylosis  without myelopathy or radiculopathy, lumbar region      Meds Administered:  Meds ordered this encounter  Medications   bupivacaine (MARCAINE) 0.25 % (with pres) injection 2 mL     Laterality: Bilateral  Location/Site: L4-L5, L3 and L4 medial branches  Needle: 5.0 in., 25 ga.  Short bevel or Quincke spinal needle  Needle Placement: Oblique pedical  Findings:   -Comments: There was excellent flow of contrast along the articular pillars without intravascular flow.  Procedure Details: The fluoroscope beam is vertically oriented in AP and then obliqued 15 to 20 degrees to the ipsilateral side of the desired nerve to achieve the Scotty dog appearance.  The skin over the target area of the junction of the superior articulating process and the transverse process (sacral ala if blocking the L5 dorsal rami) was locally anesthetized with a 1 ml volume of 1% Lidocaine without Epinephrine.  The spinal needle was inserted and advanced in a trajectory view down to the target.   After contact with periosteum and negative aspirate for blood and CSF, correct placement without intravascular or epidural spread was confirmed by injecting 0.5 ml. of Isovue-250.  A spot radiograph was obtained of this image.    Next, a 0.5 ml. volume of the injectate described above was injected. The needle was then redirected to the other facet joint nerves mentioned above if needed.  Prior to the procedure, the patient was given a Pain Diary which was completed for baseline measurements.  After the procedure, the  patient rated their pain every 30 minutes and will continue rating at this frequency for a total of 5 hours.  The patient has been asked to complete the Diary and return to us by mail, fax or hand delivered as soon as possible.   Additional Comments:  The patient tolerated the procedure well Dressing: 2 x 2 sterile gauze and Band-Aid    Post-procedure details: Patient was observed during the  procedure. Post-procedure instructions were reviewed.  Patient left the clinic in stable condition.   Clinical History: Low back pain.  Left hip pain.   EXAM: MRI LUMBAR SPINE WITHOUT CONTRAST   TECHNIQUE: Multiplanar, multisequence MR imaging of the lumbar spine was performed. No intravenous contrast was administered.   COMPARISON:  Lumbar radiographs 09/21/2017   FINDINGS: Segmentation:  Normal   Alignment: 5 mm retrolisthesis L2-3. 3 mm retrolisthesis L4-5. 5 mm anterolisthesis L4-5.   Moderate lumbar scoliosis.   Vertebrae: Negative for fracture or mass. Hemangioma T12 vertebral body.   Conus medullaris and cauda equina: Conus extends to the L1 level. Conus and cauda equina appear normal.   Paraspinal and other soft tissues: Negative for paraspinous mass or adenopathy.   Disc levels:   T12-L1: Negative   L1-2: Mild disc and mild facet degeneration.  Negative for stenosis   L2-3: Disc degeneration which is asymmetric on the left. Severe disc space narrowing on the left with associated endplate spurring. Moderate facet hypertrophy bilaterally. Moderate to severe subarticular and foraminal stenosis on the left. Mild spinal stenosis.   L3-4: Asymmetric disc degeneration on the right with disc space narrowing and spurring. Moderate to severe subarticular foraminal stenosis on the right. Moderate facet hypertrophy. Moderate spinal stenosis. Mild subarticular stenosis on the left   L4-5: Broad-based central disc protrusion. Severe facet degeneration bilaterally. Moderate subarticular stenosis on the right. Mild subarticular stenosis on the left. Mild spinal stenosis   L5-S1: Bilateral facet hypertrophy. No disc protrusion or neural impingement.   IMPRESSION: Lumbar scoliosis and multilevel degenerative changes above   Mild spinal stenosis and moderate to severe subarticular foraminal stenosis on the left at L2-3 due to spurring   Moderate to severe  subarticular foraminal stenosis on the right due to spurring at L3-4. Moderate spinal stenosis   Central disc protrusion with mild spinal stenosis L4-5. Moderate subarticular stenosis on the right.     Electronically Signed   By: Marlan Palauharles  Clark M.D.   On: 08/07/2019 16:06     Objective:  VS:  HT:     WT:    BMI:      BP:(!) 138/59   HR:(!) 103bpm   TEMP: ( )   RESP:  Physical Exam Vitals and nursing note reviewed.  Constitutional:      General: She is not in acute distress.    Appearance: Normal appearance. She is not ill-appearing.  HENT:     Head: Normocephalic and atraumatic.     Right Ear: External ear normal.     Left Ear: External ear normal.  Eyes:     Extraocular Movements: Extraocular movements intact.  Cardiovascular:     Rate and Rhythm: Normal rate.     Pulses: Normal pulses.  Pulmonary:     Effort: Pulmonary effort is normal. No respiratory distress.  Abdominal:     General: There is no distension.     Palpations: Abdomen is soft.  Musculoskeletal:        General: Tenderness present.     Cervical back: Neck supple.  Right lower leg: No edema.     Left lower leg: No edema.     Comments: Patient has good distal strength with no pain over the greater trochanters.  No clonus or focal weakness. Patient somewhat slow to rise from a seated position to full extension.  There is concordant low back pain with facet loading and lumbar spine extension rotation.  There are no definitive trigger points but the patient is somewhat tender across the lower back and PSIS.  There is no pain with hip rotation.   Skin:    Findings: No erythema, lesion or rash.  Neurological:     General: No focal deficit present.     Mental Status: She is alert and oriented to person, place, and time.     Sensory: No sensory deficit.     Motor: No weakness or abnormal muscle tone.     Coordination: Coordination normal.  Psychiatric:        Mood and Affect: Mood normal.        Behavior:  Behavior normal.     Imaging: No results found.

## 2021-02-03 NOTE — Telephone Encounter (Signed)
Budesonide approved through Cover my meds and resent.

## 2021-02-05 ENCOUNTER — Encounter: Payer: Self-pay | Admitting: Internal Medicine

## 2021-02-05 ENCOUNTER — Ambulatory Visit: Payer: Medicare PPO | Admitting: Internal Medicine

## 2021-02-05 VITALS — BP 140/72 | HR 84 | Ht 63.0 in | Wt 157.1 lb

## 2021-02-05 DIAGNOSIS — K52839 Microscopic colitis, unspecified: Secondary | ICD-10-CM

## 2021-02-05 NOTE — Progress Notes (Signed)
HISTORY OF PRESENT ILLNESS:  Sharon Bailey is a 88 y.o. female with biopsy-proven collagenous colitis (December 2016) who presents today for follow-up regarding management of the same.  She was last evaluated February 22, 2020.  The patient has required some level of budesonide therapy to control symptoms.  She has had relapse off of medication.  Since her last visit she tells me that she takes budesonide 3 mg daily.  Approximately once every other month she will have "a flare" which consist of diarrhea last approximately 1 day.  She may take an extra dose of budesonide and Imodium.  Symptoms resolved thereafter.  She experiences such flares approximately once every other month.  GI review of systems is otherwise negative.  She does request a medication refill.  Review of outside blood work from November 2022 shows creatinine 1.31.  Normal potassium  REVIEW OF SYSTEMS:  All non-GI ROS negative unless otherwise stated in the HPI except for arthritis, back pain, urinary leakage, urinary frequency  Past Medical History:  Diagnosis Date   Abnormal EKG    Collagenous colitis    Collagenous colitis    Diverticulitis    Diverticulosis    Fluttering heart    GERD (gastroesophageal reflux disease)    Gout    Gout attack 09/28/2012   Hammer toe of left foot 09/28/2012   Hyperlipidemia    Hypertension    Hypertensive emergency    Hypothyroidism    Irregular heart beat    Pain in joint, ankle and foot 09/28/2012   Status post foot surgery 10/11/2012   Thyroid disease     Past Surgical History:  Procedure Laterality Date   ABDOMINAL HYSTERECTOMY     ADENOIDECTOMY     CHOLECYSTECTOMY     Excision Ganglion Toe Left 09/29/2012   Lt #2 @ [place]   Hammer toe repair Left 09/29/2012   Lt #2 @ [place]  reports that she has never smoked. She has never used smokeless tobacco. She reports that she does not drink alcohol and does not use drugs.  family  history includes Breast cancer in her mother; Heart disease in her sister; Lung disease in her brother; Memory loss in her father; Rheumatic fever in her sister.  Allergies  Allergen Reactions   Sulfamethoxazole-Trimethoprim Diarrhea and Nausea Only    Other reaction(s): diarrhea       PHYSICAL EXAMINATION: Vital signs: BP 140/72 (BP Location: Left Arm, Patient Position: Sitting, Cuff Size: Normal)    Pulse 84    Ht 5\' 3"  (1.6 m) Comment: height measured without shoes   Wt 157 lb 2 oz (71.3 kg)    BMI 27.83 kg/m   Constitutional: generally well-appearing, no acute distress Psychiatric: alert and oriented x3, cooperative Eyes: extraocular movements intact, anicteric, conjunctiva pink Mouth: oral pharynx moist, no lesions Neck: supple no lymphadenopathy Cardiovascular: heart regular rate and rhythm. Lungs: clear to auscultation bilaterally Abdomen: soft, nontender, nondistended, no obvious ascites, no peritoneal signs, normal bowel sounds, no organomegaly Rectal: Omitted Extremities: no clubbing or cyanosis.  1+ lower extremity edema bilaterally with stasis changes Skin: no additional lesions on visible extremities Neuro: No focal deficits.  Cranial nerves intact  ASSESSMENT:  1.  Collagenous colitis.  Managed with low-dose budesonide as described 2.  Colonoscopy 2016   PLAN:  1.  Refill budesonide.  Medication risks reviewed 2.  Routine office follow-up 1 year.  Sooner if needed

## 2021-02-05 NOTE — Patient Instructions (Signed)
If you are age 86 or older, your body mass index should be between 23-30. Your Body mass index is 27.83 kg/m. If this is out of the aforementioned range listed, please consider follow up with your Primary Care Provider.  If you are age 69 or younger, your body mass index should be between 19-25. Your Body mass index is 27.83 kg/m. If this is out of the aformentioned range listed, please consider follow up with your Primary Care Provider.   ________________________________________________________  The [facility] providers would like to encourage you to use [facility] to communicate with providers for non-urgent requests or questions.  Due to long hold times on the telephone, sending your provider a message by [facility] may be a faster and more efficient way to get a response.  Please allow 48 business hours for a response.  Please remember that this is for non-urgent requests.  _______________________________________________________   Please follow up in one year

## 2021-02-15 ENCOUNTER — Other Ambulatory Visit: Payer: Self-pay | Admitting: Cardiology

## 2021-02-17 ENCOUNTER — Other Ambulatory Visit: Payer: Self-pay

## 2021-02-17 ENCOUNTER — Ambulatory Visit (INDEPENDENT_AMBULATORY_CARE_PROVIDER_SITE_OTHER): Payer: Medicare PPO | Admitting: Physical Medicine and Rehabilitation

## 2021-02-17 ENCOUNTER — Ambulatory Visit: Payer: Self-pay

## 2021-02-17 ENCOUNTER — Encounter: Payer: Self-pay | Admitting: Physical Medicine and Rehabilitation

## 2021-02-17 VITALS — BP 126/77 | HR 91

## 2021-02-17 DIAGNOSIS — M47816 Spondylosis without myelopathy or radiculopathy, lumbar region: Secondary | ICD-10-CM | POA: Diagnosis not present

## 2021-02-17 MED ORDER — METHYLPREDNISOLONE ACETATE 80 MG/ML IJ SUSP
80.0000 mg | Freq: Once | INTRAMUSCULAR | Status: AC
  Administered 2021-02-17: 80 mg

## 2021-02-17 NOTE — Telephone Encounter (Signed)
Rx(s) sent to pharmacy electronically.  

## 2021-02-17 NOTE — Patient Instructions (Signed)

## 2021-02-17 NOTE — Progress Notes (Signed)
Pt state lower back pain. Pt state walking and standing makes the pain worse. Pt state she takes pain meds to help ease her pain.  Numeric Pain Rating Scale and Functional Assessment Average Pain 6   In the last MONTH (on 0-10 scale) has pain interfered with the following?  1. General activity like being  able to carry out your everyday physical activities such as walking, climbing stairs, carrying groceries, or moving a chair?  Rating(10)   +Driver, -BT, -Dye Allergies.

## 2021-02-19 NOTE — Procedures (Signed)
Lumbar Facet Joint Nerve Denervation  Patient: Sharon Bailey      Date of Birth: [birth year] MRN: [id number] PCP: Clovis Riley, L.August Saucer, MD      Visit Date: 02/17/2021   Universal Protocol:    Date/Time: 02/15/235:50 AM  Consent Given By: the patient  Position: PRONE  Additional Comments: Vital signs were monitored before and after the procedure. Patient was prepped and draped in the usual sterile fashion. The correct patient, procedure, and site was verified.   Injection Procedure Details:   Procedure diagnoses:  1. Spondylosis without myelopathy or radiculopathy, lumbar region      Meds Administered:  Meds ordered this encounter  Medications   methylPREDNISolone acetate (DEPO-MEDROL) injection 80 mg     Laterality: Right  Location/Site:  L4-L5, L3 and L4 medial branches  Needle: 18 ga.,  68mm active tip, RF Cannula  Needle Placement: Along juncture of superior articular process and transverse pocess  Findings:  -Comments: Patient was exceedingly anxious and somewhat uncomfortable combined with the fact that she did not have her hearing aids every time something was spoken she would lift her head up and rotate and move and this made the procedure longer than it should have been.  Ultimately she did fine.  Procedure Details: For each desired target nerve, the corresponding transverse process (sacral ala for the L5 dorsal rami) was identified and the fluoroscope was positioned to square off the endplates of the corresponding vertebral body to achieve a true AP midline view.  The beam was then obliqued 15 to 20 degrees and caudally tilted 15 to 20 degrees to line up a trajectory along the target nerves. The skin over the target of the junction of superior articulating process and transverse process (sacral ala for the L5 dorsal rami) was infiltrated with 58ml of 1% Lidocaine without Epinephrine.  The 18 gauge 75mm active tip outer cannula was advanced in trajectory view to  the target.  This procedure was repeated for each target nerve.  Then, for all levels, the outer cannula placement was fine-tuned and the position was then confirmed with bi-planar imaging.    Test stimulation was done both at sensory and motor levels to ensure there was no radicular stimulation. The target tissues were then infiltrated with 1 ml of 1% Lidocaine without Epinephrine. Subsequently, a percutaneous neurotomy was carried out for 90 seconds at 80 degrees Celsius.  After the completion of the lesion, 1 ml of injectate was delivered. It was then repeated for each facet joint nerve mentioned above. Appropriate radiographs were obtained to verify the probe placement during the neurotomy.   Additional Comments:  The patient tolerated the procedure well Dressing: 2 x 2 sterile gauze and Band-Aid    Post-procedure details: Patient was observed during the procedure. Post-procedure instructions were reviewed.  Patient left the clinic in stable condition.

## 2021-02-19 NOTE — Progress Notes (Signed)
STEPHANIA MACFARLANE - 88 y.o. female MRN [id number]  Date of birth: Nov 17, 1930  Office Visit Note: Visit Date: 02/17/2021 PCP: Clovis Riley, L.August Saucer, MD Referred by: Clovis Riley, L.August Saucer, MD  Subjective: Chief Complaint  Patient presents with   Lower Back - Pain   HPI:  JENISA MONTY is a 88 y.o. female who comes in todayfor planned radiofrequency ablation of the Right L4-5 Lumbar facet joints. This would be ablation of the corresponding medial branches and/or dorsal rami.  Patient has had double diagnostic blocks with more than 50% relief.  These are documented on pain diary.  They have had chronic back pain for quite some time, more than 3 months, which has been an ongoing situation with recalcitrant axial back pain.  They have no radicular pain.  Their axial pain is worse with standing and ambulating and on exam today with facet loading.  They have had physical therapy as well as home exercise program.  The imaging noted in the chart below indicated facet pathology. Accordingly they meet all the criteria and qualification for for radiofrequency ablation and we are going to complete this today hopefully for more longer term relief as part of comprehensive management program.   Her case today is complicated by the fact that she did not wear her hearing aids.  For some reason she felt like she should not wear her hearing aids for the procedure although this was not ever indicated to her.  She is also significantly anxious about the procedure itself.  She took some Xanax medication that she had had leftover.  Despite talking with her today she is very anxious.  Consider Valium prior to the other procedure.  ROS Otherwise per HPI.  Assessment & Plan: Visit Diagnoses:    ICD-10-CM   1. Spondylosis without myelopathy or radiculopathy, lumbar region  M47.816 XR C-ARM NO REPORT    Radiofrequency,Lumbar    methylPREDNISolone acetate (DEPO-MEDROL) injection 80 mg      Plan: No additional findings.   Meds &  Orders:  Meds ordered this encounter  Medications   methylPREDNISolone acetate (DEPO-MEDROL) injection 80 mg    Orders Placed This Encounter  Procedures   Radiofrequency,Lumbar   XR C-ARM NO REPORT    Follow-up: Return if symptoms worsen or fail to improve.   Procedures: No procedures performed  Lumbar Facet Joint Nerve Denervation  Patient: ROZLYN YERBY      Date of Birth: 1930-03-06 MRN: [id number] PCP: Clovis Riley, L.August Saucer, MD      Visit Date: 02/17/2021   Universal Protocol:    Date/Time: 02/15/235:50 AM  Consent Given By: the patient  Position: PRONE  Additional Comments: Vital signs were monitored before and after the procedure. Patient was prepped and draped in the usual sterile fashion. The correct patient, procedure, and site was verified.   Injection Procedure Details:   Procedure diagnoses:  1. Spondylosis without myelopathy or radiculopathy, lumbar region      Meds Administered:  Meds ordered this encounter  Medications   methylPREDNISolone acetate (DEPO-MEDROL) injection 80 mg     Laterality: Right  Location/Site:  L4-L5, L3 and L4 medial branches  Needle: 18 ga.,  63mm active tip, RF Cannula  Needle Placement: Along juncture of superior articular process and transverse pocess  Findings:  -Comments: Patient was exceedingly anxious and somewhat uncomfortable combined with the fact that she did not have her hearing aids every time something was spoken she would lift her head up and rotate and move and this made  the procedure longer than it should have been.  Ultimately she did fine.  Procedure Details: For each desired target nerve, the corresponding transverse process (sacral ala for the L5 dorsal rami) was identified and the fluoroscope was positioned to square off the endplates of the corresponding vertebral body to achieve a true AP midline view.  The beam was then obliqued 15 to 20 degrees and caudally tilted 15 to 20 degrees to line up a  trajectory along the target nerves. The skin over the target of the junction of superior articulating process and transverse process (sacral ala for the L5 dorsal rami) was infiltrated with 18ml of 1% Lidocaine without Epinephrine.  The 18 gauge 81mm active tip outer cannula was advanced in trajectory view to the target.  This procedure was repeated for each target nerve.  Then, for all levels, the outer cannula placement was fine-tuned and the position was then confirmed with bi-planar imaging.    Test stimulation was done both at sensory and motor levels to ensure there was no radicular stimulation. The target tissues were then infiltrated with 1 ml of 1% Lidocaine without Epinephrine. Subsequently, a percutaneous neurotomy was carried out for 90 seconds at 80 degrees Celsius.  After the completion of the lesion, 1 ml of injectate was delivered. It was then repeated for each facet joint nerve mentioned above. Appropriate radiographs were obtained to verify the probe placement during the neurotomy.   Additional Comments:  The patient tolerated the procedure well Dressing: 2 x 2 sterile gauze and Band-Aid    Post-procedure details: Patient was observed during the procedure. Post-procedure instructions were reviewed.  Patient left the clinic in stable condition.       Clinical History: Low back pain.  Left hip pain.   EXAM: MRI LUMBAR SPINE WITHOUT CONTRAST   TECHNIQUE: Multiplanar, multisequence MR imaging of the lumbar spine was performed. No intravenous contrast was administered.   COMPARISON:  Lumbar radiographs 09/21/2017   FINDINGS: Segmentation:  Normal   Alignment: 5 mm retrolisthesis L2-3. 3 mm retrolisthesis L4-5. 5 mm anterolisthesis L4-5.   Moderate lumbar scoliosis.   Vertebrae: Negative for fracture or mass. Hemangioma T12 vertebral body.   Conus medullaris and cauda equina: Conus extends to the L1 level. Conus and cauda equina appear normal.   Paraspinal  and other soft tissues: Negative for paraspinous mass or adenopathy.   Disc levels:   T12-L1: Negative   L1-2: Mild disc and mild facet degeneration.  Negative for stenosis   L2-3: Disc degeneration which is asymmetric on the left. Severe disc space narrowing on the left with associated endplate spurring. Moderate facet hypertrophy bilaterally. Moderate to severe subarticular and foraminal stenosis on the left. Mild spinal stenosis.   L3-4: Asymmetric disc degeneration on the right with disc space narrowing and spurring. Moderate to severe subarticular foraminal stenosis on the right. Moderate facet hypertrophy. Moderate spinal stenosis. Mild subarticular stenosis on the left   L4-5: Broad-based central disc protrusion. Severe facet degeneration bilaterally. Moderate subarticular stenosis on the right. Mild subarticular stenosis on the left. Mild spinal stenosis   L5-S1: Bilateral facet hypertrophy. No disc protrusion or neural impingement.   IMPRESSION: Lumbar scoliosis and multilevel degenerative changes above   Mild spinal stenosis and moderate to severe subarticular foraminal stenosis on the left at L2-3 due to spurring   Moderate to severe subarticular foraminal stenosis on the right due to spurring at L3-4. Moderate spinal stenosis   Central disc protrusion with mild spinal stenosis L4-5. Moderate subarticular  stenosis on the right.     Electronically Signed   By: Marlan Palau M.D.   On: 08/07/2019 16:06     Objective:  VS:  HT:     WT:    BMI:      BP:126/77   HR:91bpm   TEMP: ( )   RESP:  Physical Exam Vitals and nursing note reviewed.  Constitutional:      General: She is not in acute distress.    Appearance: Normal appearance. She is not ill-appearing.  HENT:     Head: Normocephalic and atraumatic.     Right Ear: External ear normal.     Left Ear: External ear normal.  Eyes:     Extraocular Movements: Extraocular movements intact.  Cardiovascular:      Rate and Rhythm: Normal rate.     Pulses: Normal pulses.  Pulmonary:     Effort: Pulmonary effort is normal. No respiratory distress.  Abdominal:     General: There is no distension.     Palpations: Abdomen is soft.  Musculoskeletal:        General: Tenderness present.     Cervical back: Neck supple.     Right lower leg: No edema.     Left lower leg: No edema.     Comments: Patient has good distal strength with no pain over the greater trochanters.  No clonus or focal weakness. Patient somewhat slow to rise from a seated position to full extension.  There is concordant low back pain with facet loading and lumbar spine extension rotation.  There are no definitive trigger points but the patient is somewhat tender across the lower back and PSIS.  There is no pain with hip rotation.   Skin:    Findings: No erythema, lesion or rash.  Neurological:     General: No focal deficit present.     Mental Status: She is alert and oriented to person, place, and time.     Sensory: No sensory deficit.     Motor: No weakness or abnormal muscle tone.     Coordination: Coordination normal.  Psychiatric:        Mood and Affect: Mood normal.        Behavior: Behavior normal.     Imaging: No results found.

## 2021-02-24 ENCOUNTER — Other Ambulatory Visit: Payer: Self-pay

## 2021-02-24 ENCOUNTER — Ambulatory Visit (INDEPENDENT_AMBULATORY_CARE_PROVIDER_SITE_OTHER): Payer: Medicare PPO | Admitting: Physical Medicine and Rehabilitation

## 2021-02-24 ENCOUNTER — Ambulatory Visit: Payer: Self-pay

## 2021-02-24 ENCOUNTER — Encounter: Payer: Self-pay | Admitting: Physical Medicine and Rehabilitation

## 2021-02-24 VITALS — BP 171/75 | HR 93

## 2021-02-24 DIAGNOSIS — M47816 Spondylosis without myelopathy or radiculopathy, lumbar region: Secondary | ICD-10-CM | POA: Diagnosis not present

## 2021-02-24 MED ORDER — METHYLPREDNISOLONE ACETATE 80 MG/ML IJ SUSP
80.0000 mg | Freq: Once | INTRAMUSCULAR | Status: AC
  Administered 2021-02-24: 80 mg

## 2021-02-24 NOTE — Progress Notes (Signed)
Pt state lower back pain. Pt state walking and standing makes the pain worse. Pt state she takes pain meds to help ease her pain.  Numeric Pain Rating Scale and Functional Assessment Average Pain 8   In the last MONTH (on 0-10 scale) has pain interfered with the following?  1. General activity like being  able to carry out your everyday physical activities such as walking, climbing stairs, carrying groceries, or moving a chair?  Rating(10)   +Driver, -BT, -Dye Allergies.

## 2021-02-24 NOTE — Patient Instructions (Signed)

## 2021-02-27 DIAGNOSIS — H6123 Impacted cerumen, bilateral: Secondary | ICD-10-CM | POA: Insufficient documentation

## 2021-03-05 NOTE — Procedures (Signed)
Lumbar Facet Joint Nerve Denervation  Patient: Sharon Bailey      Date of Birth: 05/28/1930 MRN: [id number] PCP: Clovis Riley, L.August Saucer, MD      Visit Date: 02/24/2021   Universal Protocol:    Date/Time: 03/01/237:57 AM  Consent Given By: the patient  Position: PRONE  Additional Comments: Vital signs were monitored before and after the procedure. Patient was prepped and draped in the usual sterile fashion. The correct patient, procedure, and site was verified.   Injection Procedure Details:   Procedure diagnoses:  1. Spondylosis without myelopathy or radiculopathy, lumbar region      Meds Administered:  Meds ordered this encounter  Medications   methylPREDNISolone acetate (DEPO-MEDROL) injection 80 mg     Laterality: Left  Location/Site:  L4-L5, L3 and L4 medial branches  Needle: 18 ga.,  2mm active tip, RF Cannula  Needle Placement: Along juncture of superior articular process and transverse pocess  Findings:  -Comments:  Procedure Details: For each desired target nerve, the corresponding transverse process (sacral ala for the L5 dorsal rami) was identified and the fluoroscope was positioned to square off the endplates of the corresponding vertebral body to achieve a true AP midline view.  The beam was then obliqued 15 to 20 degrees and caudally tilted 15 to 20 degrees to line up a trajectory along the target nerves. The skin over the target of the junction of superior articulating process and transverse process (sacral ala for the L5 dorsal rami) was infiltrated with 63ml of 1% Lidocaine without Epinephrine.  The 18 gauge 63mm active tip outer cannula was advanced in trajectory view to the target.  This procedure was repeated for each target nerve.  Then, for all levels, the outer cannula placement was fine-tuned and the position was then confirmed with bi-planar imaging.    Test stimulation was done both at sensory and motor levels to ensure there was no radicular  stimulation. The target tissues were then infiltrated with 1 ml of 1% Lidocaine without Epinephrine. Subsequently, a percutaneous neurotomy was carried out for 90 seconds at 80 degrees Celsius.  After the completion of the lesion, 1 ml of injectate was delivered. It was then repeated for each facet joint nerve mentioned above. Appropriate radiographs were obtained to verify the probe placement during the neurotomy.   Additional Comments:  The patient tolerated the procedure well Dressing: 2 x 2 sterile gauze and Band-Aid    Post-procedure details: Patient was observed during the procedure. Post-procedure instructions were reviewed.  Patient left the clinic in stable condition.

## 2021-03-05 NOTE — Progress Notes (Signed)
LAQUINDA MOLLER - 88 y.o. female MRN [id number]  Date of birth: 1930-09-28  Office Visit Note: Visit Date: 02/24/2021 PCP: Clovis Riley, L.August Saucer, MD Referred by: Clovis Riley, L.August Saucer, MD  Subjective: Chief Complaint  Patient presents with   Lower Back - Pain   HPI:  LASTACIA SOLUM is a 88 y.o. female who comes in todayfor planned radiofrequency ablation of the Left L4-5 Lumbar facet joints. This would be ablation of the corresponding medial branches and/or dorsal rami.  Patient has had double diagnostic blocks with more than 50% relief.  These are documented on pain diary.  They have had chronic back pain for quite some time, more than 3 months, which has been an ongoing situation with recalcitrant axial back pain.  They have no radicular pain.  Their axial pain is worse with standing and ambulating and on exam today with facet loading.  They have had physical therapy as well as home exercise program.  The imaging noted in the chart below indicated facet pathology. Accordingly they meet all the criteria and qualification for for radiofrequency ablation and we are going to complete this today hopefully for more longer term relief as part of comprehensive management program.  ROS Otherwise per HPI.  Assessment & Plan: Visit Diagnoses:    ICD-10-CM   1. Spondylosis without myelopathy or radiculopathy, lumbar region  M47.816 XR C-ARM NO REPORT    Radiofrequency,Lumbar    methylPREDNISolone acetate (DEPO-MEDROL) injection 80 mg      Plan: No additional findings.   Meds & Orders:  Meds ordered this encounter  Medications   methylPREDNISolone acetate (DEPO-MEDROL) injection 80 mg    Orders Placed This Encounter  Procedures   Radiofrequency,Lumbar   XR C-ARM NO REPORT    Follow-up: Return if symptoms worsen or fail to improve.   Procedures: No procedures performed  Lumbar Facet Joint Nerve Denervation  Patient: NAYOMI TABRON      Date of Birth: 12-30-1930 MRN: [id number] PCP: Clovis Riley,  L.August Saucer, MD      Visit Date: 02/24/2021   Universal Protocol:    Date/Time: 03/01/237:57 AM  Consent Given By: the patient  Position: PRONE  Additional Comments: Vital signs were monitored before and after the procedure. Patient was prepped and draped in the usual sterile fashion. The correct patient, procedure, and site was verified.   Injection Procedure Details:   Procedure diagnoses:  1. Spondylosis without myelopathy or radiculopathy, lumbar region      Meds Administered:  Meds ordered this encounter  Medications   methylPREDNISolone acetate (DEPO-MEDROL) injection 80 mg     Laterality: Left  Location/Site:  L4-L5, L3 and L4 medial branches  Needle: 18 ga.,  80mm active tip, RF Cannula  Needle Placement: Along juncture of superior articular process and transverse pocess  Findings:  -Comments:  Procedure Details: For each desired target nerve, the corresponding transverse process (sacral ala for the L5 dorsal rami) was identified and the fluoroscope was positioned to square off the endplates of the corresponding vertebral body to achieve a true AP midline view.  The beam was then obliqued 15 to 20 degrees and caudally tilted 15 to 20 degrees to line up a trajectory along the target nerves. The skin over the target of the junction of superior articulating process and transverse process (sacral ala for the L5 dorsal rami) was infiltrated with 55ml of 1% Lidocaine without Epinephrine.  The 18 gauge 20mm active tip outer cannula was advanced in trajectory view to the target.  This  procedure was repeated for each target nerve.  Then, for all levels, the outer cannula placement was fine-tuned and the position was then confirmed with bi-planar imaging.    Test stimulation was done both at sensory and motor levels to ensure there was no radicular stimulation. The target tissues were then infiltrated with 1 ml of 1% Lidocaine without Epinephrine. Subsequently, a  percutaneous neurotomy was carried out for 90 seconds at 80 degrees Celsius.  After the completion of the lesion, 1 ml of injectate was delivered. It was then repeated for each facet joint nerve mentioned above. Appropriate radiographs were obtained to verify the probe placement during the neurotomy.   Additional Comments:  The patient tolerated the procedure well Dressing: 2 x 2 sterile gauze and Band-Aid    Post-procedure details: Patient was observed during the procedure. Post-procedure instructions were reviewed.  Patient left the clinic in stable condition.       Clinical History: Low back pain.  Left hip pain.   EXAM: MRI LUMBAR SPINE WITHOUT CONTRAST   TECHNIQUE: Multiplanar, multisequence MR imaging of the lumbar spine was performed. No intravenous contrast was administered.   COMPARISON:  Lumbar radiographs 09/21/2017   FINDINGS: Segmentation:  Normal   Alignment: 5 mm retrolisthesis L2-3. 3 mm retrolisthesis L4-5. 5 mm anterolisthesis L4-5.   Moderate lumbar scoliosis.   Vertebrae: Negative for fracture or mass. Hemangioma T12 vertebral body.   Conus medullaris and cauda equina: Conus extends to the L1 level. Conus and cauda equina appear normal.   Paraspinal and other soft tissues: Negative for paraspinous mass or adenopathy.   Disc levels:   T12-L1: Negative   L1-2: Mild disc and mild facet degeneration.  Negative for stenosis   L2-3: Disc degeneration which is asymmetric on the left. Severe disc space narrowing on the left with associated endplate spurring. Moderate facet hypertrophy bilaterally. Moderate to severe subarticular and foraminal stenosis on the left. Mild spinal stenosis.   L3-4: Asymmetric disc degeneration on the right with disc space narrowing and spurring. Moderate to severe subarticular foraminal stenosis on the right. Moderate facet hypertrophy. Moderate spinal stenosis. Mild subarticular stenosis on the left   L4-5:  Broad-based central disc protrusion. Severe facet degeneration bilaterally. Moderate subarticular stenosis on the right. Mild subarticular stenosis on the left. Mild spinal stenosis   L5-S1: Bilateral facet hypertrophy. No disc protrusion or neural impingement.   IMPRESSION: Lumbar scoliosis and multilevel degenerative changes above   Mild spinal stenosis and moderate to severe subarticular foraminal stenosis on the left at L2-3 due to spurring   Moderate to severe subarticular foraminal stenosis on the right due to spurring at L3-4. Moderate spinal stenosis   Central disc protrusion with mild spinal stenosis L4-5. Moderate subarticular stenosis on the right.     Electronically Signed   By: Marlan Palau M.D.   On: 08/07/2019 16:06     Objective:  VS:  HT:     WT:    BMI:      BP:(!) 171/75   HR:93bpm   TEMP: ( )   RESP:  Physical Exam Vitals and nursing note reviewed.  Constitutional:      General: She is not in acute distress.    Appearance: Normal appearance. She is not ill-appearing.  HENT:     Head: Normocephalic and atraumatic.     Right Ear: External ear normal.     Left Ear: External ear normal.  Eyes:     Extraocular Movements: Extraocular movements intact.  Cardiovascular:  Rate and Rhythm: Normal rate.     Pulses: Normal pulses.  Pulmonary:     Effort: Pulmonary effort is normal. No respiratory distress.  Abdominal:     General: There is no distension.     Palpations: Abdomen is soft.  Musculoskeletal:        General: Tenderness present.     Cervical back: Neck supple.     Right lower leg: No edema.     Left lower leg: No edema.     Comments: Patient has good distal strength with no pain over the greater trochanters.  No clonus or focal weakness. Patient somewhat slow to rise from a seated position to full extension.  There is concordant low back pain with facet loading and lumbar spine extension rotation.  There are no definitive trigger points but  the patient is somewhat tender across the lower back and PSIS.  There is no pain with hip rotation.   Skin:    Findings: No erythema, lesion or rash.  Neurological:     General: No focal deficit present.     Mental Status: She is alert and oriented to person, place, and time.     Sensory: No sensory deficit.     Motor: No weakness or abnormal muscle tone.     Coordination: Coordination normal.  Psychiatric:        Mood and Affect: Mood normal.        Behavior: Behavior normal.     Imaging: No results found.

## 2021-03-20 ENCOUNTER — Telehealth: Payer: Self-pay | Admitting: Physical Medicine and Rehabilitation

## 2021-03-20 NOTE — Telephone Encounter (Signed)
Pt called requesting a call back to set an appt. Please call pt at [phone]. ?

## 2021-03-27 ENCOUNTER — Encounter: Payer: Self-pay | Admitting: Physical Medicine and Rehabilitation

## 2021-03-27 ENCOUNTER — Ambulatory Visit: Payer: Medicare PPO | Admitting: Physical Medicine and Rehabilitation

## 2021-03-27 ENCOUNTER — Other Ambulatory Visit: Payer: Self-pay

## 2021-03-27 VITALS — BP 184/80 | HR 109

## 2021-03-27 DIAGNOSIS — M48062 Spinal stenosis, lumbar region with neurogenic claudication: Secondary | ICD-10-CM

## 2021-03-27 DIAGNOSIS — M25552 Pain in left hip: Secondary | ICD-10-CM

## 2021-03-27 DIAGNOSIS — M47816 Spondylosis without myelopathy or radiculopathy, lumbar region: Secondary | ICD-10-CM

## 2021-03-27 DIAGNOSIS — M5442 Lumbago with sciatica, left side: Secondary | ICD-10-CM

## 2021-03-27 DIAGNOSIS — M79605 Pain in left leg: Secondary | ICD-10-CM | POA: Diagnosis not present

## 2021-03-27 DIAGNOSIS — M5416 Radiculopathy, lumbar region: Secondary | ICD-10-CM | POA: Diagnosis not present

## 2021-03-27 DIAGNOSIS — M48061 Spinal stenosis, lumbar region without neurogenic claudication: Secondary | ICD-10-CM | POA: Diagnosis not present

## 2021-03-27 DIAGNOSIS — G8929 Other chronic pain: Secondary | ICD-10-CM

## 2021-03-27 MED ORDER — GABAPENTIN 100 MG PO CAPS: 100.0000 mg | ORAL_CAPSULE | Freq: Every day | ORAL | 0 refills | Status: DC

## 2021-03-27 NOTE — Progress Notes (Signed)
? ?Sharon Bailey - 88 y.o. female MRN [id number]  Date of birth: 04-19-1930 ? ?Office Visit Note: ?Visit Date: 03/27/2021 ?PCP: Alroy Dust, L.Marlou Sa, MD ?Referred by: Alroy Dust, L.Marlou Sa, MD ? ?Subjective: ?Chief Complaint  ?Patient presents with  ? Lower Back - Pain  ? Left Leg - Pain  ? Left Foot - Pain  ? ?HPI: Sharon Bailey is a 88 y.o. female who comes in today for evaluation of chronic, worsening and severe bilateral lower back pain radiating down left leg to foot.  Patient is somewhat a poor historian.  Patient reports pain has been ongoing for several years and is exacerbated by prolonged standing/walking and activity. She describes pain as constant sore heavy and tingling sensation, currently rates as 9 out of 10. Patient reports some relief of pain with rest and use of medications. Patient states she does take Tramadol as needed for moderate/severe pain at home. Patients lumbar MRI from 2021 exhibits lumbar scoliosis, multi-level facet hypertrophy, mild spinal canal stenosis and moderate to severe subarticular stenosis on the left at L2-L3 and moderate spinal canal stenosis noted at L3-L4. No high grade spinal canal stenosis noted. Patient recently had bilateral L4-L5 radiofrequency ablation performed in our office in February and reports that this procedure worsened her pain.  Patient has had previous lumbar epidural steroid injections performed in our office and to her knowledge these injections have helped to alleviate her pain.  Patient is inquiring about repeating lumbar epidural steroid injection and would also like to discuss other treatment options.  Patient denies focal weakness.  Patient denies recent trauma or falls. ? ? ? ? ?Review of Systems  ?Musculoskeletal:  Positive for back pain.  ?Neurological:  Positive for tingling. Negative for focal weakness and weakness.  ?All other systems reviewed and are negative. Otherwise per HPI. ? ?Assessment & Plan: ?Visit Diagnoses:  ?  ICD-10-CM   ?1. Lumbar  radiculopathy  M54.16 Ambulatory referral to Physical Medicine Rehab  ?  ?2. Chronic left-sided low back pain with left-sided sciatica  M54.42 Ambulatory referral to Physical Medicine Rehab  ? G89.29   ?  ?3. Spinal stenosis of lumbar region with neurogenic claudication  M48.062 Ambulatory referral to Physical Medicine Rehab  ?  ?4. Bilateral stenosis of lateral recess of lumbar spine  M48.061 Ambulatory referral to Physical Medicine Rehab  ?  ?5. Pain in left hip  M25.552 Ambulatory referral to Physical Medicine Rehab  ?  ?6. Pain in left leg  M79.605 Ambulatory referral to Physical Medicine Rehab  ?  ?7. Facet arthropathy, lumbar  M47.816 Ambulatory referral to Physical Medicine Rehab  ?  ?   ?Plan: Findings:  ?Chronic, worsening and severe bilateral lower back pain radiating down left leg to the foot.  Patient continues to have excruciating pain despite good conservative therapies such as rest and use of medications.  Patient's clinical presentation and exam are consistent with L4-L5 nerve pattern.  We believe the next step is to perform a diagnostic and hopefully therapeutic left L4-L5 interlaminar epidural steroid injection under fluoroscopic guidance.  Patient is not currently undergoing long-term anticoagulant therapy.  If patient does not get good relief with lumbar epidural steroid injection we would consider obtaining new lumbar MRI imaging.  We also spoke with patient in detail today regarding medication management and I did write her a prescription for Gabapentin.  Patient instructed to start with 1 tablet (100 mg) at bedtime, plan is to titrate up as tolerated.  Patient encouraged to remain active.  No red flag symptoms noted upon exam.  ? ?Meds & Orders:  ?Meds ordered this encounter  ?Medications  ? gabapentin (NEURONTIN) 100 MG capsule  ?  Sig: Take 1 capsule (100 mg total) by mouth at bedtime.  ?  Dispense:  60 capsule  ?  Refill:  0  ?  Order Specific Question:   Supervising Provider  ?  AnswerMagnus Sinning [id number]  ?  ?Orders Placed This Encounter  ?Procedures  ? Ambulatory referral to Physical Medicine Rehab  ?  ?Follow-up: Return for Left L4-L5 interlaminar epidural steroid injection.  ? ?Procedures: ?No procedures performed  ?   ? ?Clinical History: ?EXAM: ?MRI LUMBAR SPINE WITHOUT CONTRAST ?  ?TECHNIQUE: ?Multiplanar, multisequence MR imaging of the lumbar spine was ?performed. No intravenous contrast was administered. ?  ?COMPARISON:  Lumbar radiographs 09/21/2017 ?  ?FINDINGS: ?Segmentation:  Normal ?  ?Alignment: 5 mm retrolisthesis L2-3. 3 mm retrolisthesis L4-5. 5 mm ?anterolisthesis L4-5. ?  ?Moderate lumbar scoliosis. ?  ?Vertebrae: Negative for fracture or mass. Hemangioma T12 vertebral ?body. ?  ?Conus medullaris and cauda equina: Conus extends to the L1 level. ?Conus and cauda equina appear normal. ?  ?Paraspinal and other soft tissues: Negative for paraspinous mass or ?adenopathy. ?  ?Disc levels: ?  ?T12-L1: Negative ?  ?L1-2: Mild disc and mild facet degeneration.  Negative for stenosis ?  ?L2-3: Disc degeneration which is asymmetric on the left. Severe disc ?space narrowing on the left with associated endplate spurring. ?Moderate facet hypertrophy bilaterally. Moderate to severe ?subarticular and foraminal stenosis on the left. Mild spinal ?stenosis. ?  ?L3-4: Asymmetric disc degeneration on the right with disc space ?narrowing and spurring. Moderate to severe subarticular foraminal ?stenosis on the right. Moderate facet hypertrophy. Moderate spinal ?stenosis. Mild subarticular stenosis on the left ?  ?L4-5: Broad-based central disc protrusion. Severe facet degeneration ?bilaterally. Moderate subarticular stenosis on the right. Mild ?subarticular stenosis on the left. Mild spinal stenosis ?  ?L5-S1: Bilateral facet hypertrophy. No disc protrusion or neural ?impingement. ?  ?IMPRESSION: ?Lumbar scoliosis and multilevel degenerative changes above ?  ?Mild spinal stenosis and moderate  to severe subarticular foraminal ?stenosis on the left at L2-3 due to spurring ?  ?Moderate to severe subarticular foraminal stenosis on the right due ?to spurring at L3-4. Moderate spinal stenosis ?  ?Central disc protrusion with mild spinal stenosis L4-5. Moderate ?subarticular stenosis on the right. ?  ?  ?Electronically Signed ?  By: Franchot Gallo M.D. ?  On: 08/07/2019 16:06  ? ?She reports that she has never smoked. She has never used smokeless tobacco. No results for input(s): HGBA1C, LABURIC in the last 8760 hours. ? ?Objective:  VS:  HT:    WT:   BMI:     BP:(!) 184/80  HR:(!) 109bpm  TEMP: ( )  RESP:  ?Physical Exam ?Vitals and nursing note reviewed.  ?HENT:  ?   Head: Normocephalic and atraumatic.  ?   Right Ear: External ear normal.  ?   Left Ear: External ear normal.  ?   Nose: Nose normal.  ?   Mouth/Throat:  ?   Mouth: Mucous membranes are moist.  ?Eyes:  ?   Extraocular Movements: Extraocular movements intact.  ?Cardiovascular:  ?   Rate and Rhythm: Normal rate.  ?   Pulses: Normal pulses.  ?Pulmonary:  ?   Effort: Pulmonary effort is normal.  ?Abdominal:  ?   General: Abdomen is flat. There is no distension.  ?  Musculoskeletal:     ?   General: Tenderness present.  ?   Cervical back: Normal range of motion.  ?   Comments: Pt rises from seated position to standing without difficulty. Good lumbar range of motion. Strong distal strength without clonus, no pain upon palpation of greater trochanters. Dysesthesias noted to left L4/L5 dermatomes. Sensation intact bilaterally. Walks independently, gait steady.   ?Skin: ?   General: Skin is warm and dry.  ?   Capillary Refill: Capillary refill takes less than 2 seconds.  ?Neurological:  ?   General: No focal deficit present.  ?   Mental Status: She is alert and oriented to person, place, and time.  ?Psychiatric:     ?   Mood and Affect: Mood normal.     ?   Behavior: Behavior normal.  ?  ?Ortho Exam ? ?Imaging: ?No results found. ? ?Past  Medical/Family/Surgical/Social History: ?Medications & Allergies reviewed per EMR, new medications updated. ?Patient Active Problem List  ? Diagnosis Date Noted  ? Allergic rhinitis 06/18/2020  ? Cerebrovascular disease 06/

## 2021-03-27 NOTE — Progress Notes (Signed)
Pt state lower back pain that travels to her left leg and foot. Pt state standing and walking makes the pain worse. Pt state she takes pain meds to help ease her pain.Pt has hx of inj 02/24/21 pt state it didn't help.4 ? ?Numeric Pain Rating Scale and Functional Assessment ?Average Pain 8 ?Pain Right Now 8 ?My pain is intermittent, dull, stabbing, and aching ?Pain is worse with: walking, standing, and some activites ?Pain improves with: medication and injections ? ? ?In the last MONTH (on 0-10 scale) has pain interfered with the following? ? ?1. General activity like being  able to carry out your everyday physical activities such as walking, climbing stairs, carrying groceries, or moving a chair?  ?Rating(6) ? ?2. Relation with others like being able to carry out your usual social activities and roles such as  activities at home, at work and in your community. ?Rating(7) ? ?3. Enjoyment of life such that you have  been bothered by emotional problems such as feeling anxious, depressed or irritable?  ?Rating(8) ? ?

## 2021-03-31 DIAGNOSIS — H353221 Exudative age-related macular degeneration, left eye, with active choroidal neovascularization: Secondary | ICD-10-CM | POA: Diagnosis not present

## 2021-04-14 ENCOUNTER — Other Ambulatory Visit: Payer: Self-pay | Admitting: Internal Medicine

## 2021-04-22 ENCOUNTER — Encounter: Payer: Self-pay | Admitting: Physical Medicine and Rehabilitation

## 2021-04-22 ENCOUNTER — Ambulatory Visit: Payer: Self-pay

## 2021-04-22 ENCOUNTER — Ambulatory Visit (INDEPENDENT_AMBULATORY_CARE_PROVIDER_SITE_OTHER): Payer: Medicare PPO | Admitting: Physical Medicine and Rehabilitation

## 2021-04-22 DIAGNOSIS — M5416 Radiculopathy, lumbar region: Secondary | ICD-10-CM | POA: Diagnosis not present

## 2021-04-22 MED ORDER — METHYLPREDNISOLONE ACETATE 80 MG/ML IJ SUSP
80.0000 mg | Freq: Once | INTRAMUSCULAR | Status: AC
  Administered 2021-04-22: 80 mg

## 2021-04-22 NOTE — Progress Notes (Signed)
Pt state lower back pain that travels to her left leg and foot. Pt state standing and walking makes the pain worse. Pt state she takes pain meds to help ease her pain ? ?Numeric Pain Rating Scale and Functional Assessment ?Average Pain 6 ? ? ?In the last MONTH (on 0-10 scale) has pain interfered with the following? ? ?1. General activity like being  able to carry out your everyday physical activities such as walking, climbing stairs, carrying groceries, or moving a chair?  ?Rating(9) ? ? ?+Driver, -BT, -Dye Allergies. ? ?

## 2021-04-22 NOTE — Patient Instructions (Signed)

## 2021-04-23 DIAGNOSIS — H903 Sensorineural hearing loss, bilateral: Secondary | ICD-10-CM | POA: Diagnosis not present

## 2021-04-30 NOTE — Procedures (Signed)
Lumbar Epidural Steroid Injection - Interlaminar Approach with Fluoroscopic Guidance ? ?Patient: Sharon Bailey      ?Date of Birth: 27-Jul-1930 ?MRN: [id number] ?PCP: Clovis Riley, L.August Saucer, MD      ?Visit Date: 04/22/2021 ?  ?Universal Protocol:    ? ?Consent Given By: the patient ? ?Position: PRONE ? ?Additional Comments: ?Vital signs were monitored before and after the procedure. ?Patient was prepped and draped in the usual sterile fashion. ?The correct patient, procedure, and site was verified. ? ? ?Injection Procedure Details:  ? ?Procedure diagnoses: Lumbar radiculopathy [M54.16]  ? ?Meds Administered:  ?Meds ordered this encounter  ?Medications  ? methylPREDNISolone acetate (DEPO-MEDROL) injection 80 mg  ?  ? ?Laterality: Left ? ?Location/Site:  L4-5 ? ?Needle: 3.5 in., 20 ga. Tuohy ? ?Needle Placement: Paramedian epidural ? ?Findings:  ? -Comments: Excellent flow of contrast into the epidural space. ? ?Procedure Details: ?Using a paramedian approach from the side mentioned above, the region overlying the inferior lamina was localized under fluoroscopic visualization and the soft tissues overlying this structure were infiltrated with 4 ml. of 1% Lidocaine without Epinephrine. The Tuohy needle was inserted into the epidural space using a paramedian approach.  ? ?The epidural space was localized using loss of resistance along with counter oblique bi-planar fluoroscopic views.  After negative aspirate for air, blood, and CSF, a 2 ml. volume of Isovue-250 was injected into the epidural space and the flow of contrast was observed. Radiographs were obtained for documentation purposes.   ? ?The injectate was administered into the level noted above. ? ? ?Additional Comments:  ?The patient tolerated the procedure well ?Dressing: 2 x 2 sterile gauze and Band-Aid ?  ? ?Post-procedure details: ?Patient was observed during the procedure. ?Post-procedure instructions were reviewed. ? ?Patient left the clinic in stable condition. ?

## 2021-04-30 NOTE — Progress Notes (Signed)
? ?[name] - 88 y.o. female MRN [id number]  Date of birth: Aug 06, 1930 ? ?Office Visit Note: ?Visit Date: 04/22/2021 ?PCP: Clovis Riley, L.August Saucer, MD ?Referred by: Clovis Riley, L.August Saucer, MD ? ?Subjective: ?Chief Complaint  ?Patient presents with  ? Lower Back - Pain  ? Left Leg - Pain  ? Left Foot - Pain  ? ?HPI:  Sharon Bailey is a 88 y.o. female who comes in today at the request of Ellin Goodie, FNP for planned Left L4-5 Lumbar Interlaminar epidural steroid injection with fluoroscopic guidance.  The patient has failed conservative care including home exercise, medications, time and activity modification.  This injection will be diagnostic and hopefully therapeutic.  Please see requesting physician notes for further details and justification. MRI reviewed with images and spine model.  MRI reviewed in the note below.  ? ?ROS Otherwise per HPI. ? ?Assessment & Plan: ?Visit Diagnoses:  ?  ICD-10-CM   ?1. Lumbar radiculopathy  M54.16 XR C-ARM NO REPORT  ?  Epidural Steroid injection  ?  methylPREDNISolone acetate (DEPO-MEDROL) injection 80 mg  ?  ?  ?Plan: No additional findings.  ? ?Meds & Orders:  ?Meds ordered this encounter  ?Medications  ? methylPREDNISolone acetate (DEPO-MEDROL) injection 80 mg  ?  ?Orders Placed This Encounter  ?Procedures  ? XR C-ARM NO REPORT  ? Epidural Steroid injection  ?  ?Follow-up: Return if symptoms worsen or fail to improve.  ? ?Procedures: ?No procedures performed  ?Lumbar Epidural Steroid Injection - Interlaminar Approach with Fluoroscopic Guidance ? ?Patient: Sharon Bailey      ?Date of Birth: 02-12-1930 ?MRN: [id number] ?PCP: Clovis Riley, L.August Saucer, MD      ?Visit Date: 04/22/2021 ?  ?Universal Protocol:    ? ?Consent Given By: the patient ? ?Position: PRONE ? ?Additional Comments: ?Vital signs were monitored before and after the procedure. ?Patient was prepped and draped in the usual sterile fashion. ?The correct patient, procedure, and site was verified. ? ? ?Injection Procedure Details:   ? ?Procedure diagnoses: Lumbar radiculopathy [M54.16]  ? ?Meds Administered:  ?Meds ordered this encounter  ?Medications  ? methylPREDNISolone acetate (DEPO-MEDROL) injection 80 mg  ?  ? ?Laterality: Left ? ?Location/Site:  L4-5 ? ?Needle: 3.5 in., 20 ga. Tuohy ? ?Needle Placement: Paramedian epidural ? ?Findings:  ? -Comments: Excellent flow of contrast into the epidural space. ? ?Procedure Details: ?Using a paramedian approach from the side mentioned above, the region overlying the inferior lamina was localized under fluoroscopic visualization and the soft tissues overlying this structure were infiltrated with 4 ml. of 1% Lidocaine without Epinephrine. The Tuohy needle was inserted into the epidural space using a paramedian approach.  ? ?The epidural space was localized using loss of resistance along with counter oblique bi-planar fluoroscopic views.  After negative aspirate for air, blood, and CSF, a 2 ml. volume of Isovue-250 was injected into the epidural space and the flow of contrast was observed. Radiographs were obtained for documentation purposes.   ? ?The injectate was administered into the level noted above. ? ? ?Additional Comments:  ?The patient tolerated the procedure well ?Dressing: 2 x 2 sterile gauze and Band-Aid ?  ? ?Post-procedure details: ?Patient was observed during the procedure. ?Post-procedure instructions were reviewed. ? ?Patient left the clinic in stable condition.  ? ?Clinical History: ?EXAM: ?MRI LUMBAR SPINE WITHOUT CONTRAST ?  ?TECHNIQUE: ?Multiplanar, multisequence MR imaging of the lumbar spine was ?performed. No intravenous contrast was administered. ?  ?COMPARISON:  Lumbar radiographs 09/21/2017 ?  ?FINDINGS: ?Segmentation:  Normal ?  ?Alignment: 5 mm retrolisthesis L2-3. 3 mm retrolisthesis L4-5. 5 mm ?anterolisthesis L4-5. ?  ?Moderate lumbar scoliosis. ?  ?Vertebrae: Negative for fracture or mass. Hemangioma T12 vertebral ?body. ?  ?Conus medullaris and cauda equina: Conus  extends to the L1 level. ?Conus and cauda equina appear normal. ?  ?Paraspinal and other soft tissues: Negative for paraspinous mass or ?adenopathy. ?  ?Disc levels: ?  ?T12-L1: Negative ?  ?L1-2: Mild disc and mild facet degeneration.  Negative for stenosis ?  ?L2-3: Disc degeneration which is asymmetric on the left. Severe disc ?space narrowing on the left with associated endplate spurring. ?Moderate facet hypertrophy bilaterally. Moderate to severe ?subarticular and foraminal stenosis on the left. Mild spinal ?stenosis. ?  ?L3-4: Asymmetric disc degeneration on the right with disc space ?narrowing and spurring. Moderate to severe subarticular foraminal ?stenosis on the right. Moderate facet hypertrophy. Moderate spinal ?stenosis. Mild subarticular stenosis on the left ?  ?L4-5: Broad-based central disc protrusion. Severe facet degeneration ?bilaterally. Moderate subarticular stenosis on the right. Mild ?subarticular stenosis on the left. Mild spinal stenosis ?  ?L5-S1: Bilateral facet hypertrophy. No disc protrusion or neural ?impingement. ?  ?IMPRESSION: ?Lumbar scoliosis and multilevel degenerative changes above ?  ?Mild spinal stenosis and moderate to severe subarticular foraminal ?stenosis on the left at L2-3 due to spurring ?  ?Moderate to severe subarticular foraminal stenosis on the right due ?to spurring at L3-4. Moderate spinal stenosis ?  ?Central disc protrusion with mild spinal stenosis L4-5. Moderate ?subarticular stenosis on the right. ?  ?  ?Electronically Signed ?  By: Marlan Palau M.D. ?  On: 08/07/2019 16:06  ? ? ? ?Objective:  VS:  HT:    WT:   BMI:     BP:   HR: bpm  TEMP: ( )  RESP:  ?Physical Exam ?Vitals and nursing note reviewed.  ?Constitutional:   ?   General: She is not in acute distress. ?   Appearance: Normal appearance. She is not ill-appearing.  ?HENT:  ?   Head: Normocephalic and atraumatic.  ?   Right Ear: External ear normal.  ?   Left Ear: External ear normal.  ?Eyes:  ?    Extraocular Movements: Extraocular movements intact.  ?Cardiovascular:  ?   Rate and Rhythm: Normal rate.  ?   Pulses: Normal pulses.  ?Pulmonary:  ?   Effort: Pulmonary effort is normal. No respiratory distress.  ?Abdominal:  ?   General: There is no distension.  ?   Palpations: Abdomen is soft.  ?Musculoskeletal:     ?   General: Tenderness present.  ?   Cervical back: Neck supple.  ?   Right lower leg: No edema.  ?   Left lower leg: No edema.  ?   Comments: Patient has good distal strength with no pain over the greater trochanters.  No clonus or focal weakness.  ?Skin: ?   Findings: No erythema, lesion or rash.  ?Neurological:  ?   General: No focal deficit present.  ?   Mental Status: She is alert and oriented to person, place, and time.  ?   Sensory: No sensory deficit.  ?   Motor: No weakness or abnormal muscle tone.  ?   Coordination: Coordination normal.  ?Psychiatric:     ?   Mood and Affect: Mood normal.     ?   Behavior: Behavior normal.  ?  ? ?Imaging: ?No results found. ?

## 2021-05-07 ENCOUNTER — Ambulatory Visit: Payer: Medicare PPO | Admitting: Podiatry

## 2021-05-07 DIAGNOSIS — I739 Peripheral vascular disease, unspecified: Secondary | ICD-10-CM

## 2021-05-07 DIAGNOSIS — B351 Tinea unguium: Secondary | ICD-10-CM

## 2021-05-07 DIAGNOSIS — M79675 Pain in left toe(s): Secondary | ICD-10-CM

## 2021-05-07 DIAGNOSIS — M79674 Pain in right toe(s): Secondary | ICD-10-CM | POA: Diagnosis not present

## 2021-05-17 ENCOUNTER — Encounter: Payer: Self-pay | Admitting: Podiatry

## 2021-05-17 NOTE — Progress Notes (Signed)
?  Subjective:  ?Patient ID: Sharon Bailey, female    DOB: 12-06-1930,  MRN: [id number] ? ?Sharon Bailey presents to clinic today for for at risk foot care. Patient has h/o PAD and painful thick toenails that are difficult to trim. Pain interferes with ambulation. Aggravating factors include wearing enclosed shoe gear. Pain is relieved with periodic professional debridement. ? ?New problem(s): None.  ? ?PCP is Clovis Riley, L.August Saucer, MD , and last visit was January 13, 2021. ? ?Allergies  ?Allergen Reactions  ? Sulfamethoxazole-Trimethoprim Diarrhea and Nausea Only  ?  Other reaction(s): diarrhea  ? ? ?Review of Systems: Negative except as noted in the HPI. ? ?Objective: No changes noted in today's physical examination. ? ?Vascular Examination: ?Capillary refill time to digits immediate b/l. Palpable DP pulse(s) b/l LE. Diminished PT pulse(s) b/l LE. Pedal hair absent. No pain with calf compression b/l. No ischemia or gangrene noted b/l LE. No cyanosis or clubbing noted b/l LE. ? ?Neurological Examination: ?Protective sensation intact 5/5 intact bilaterally with 10g monofilament b/l. ? ?Dermatological Examination: ?Pedal skin warm and supple b/l.  No open wounds b/l. No interdigital macerations. Toenails 1-5 b/l elongated, thickened, discolored with subungual debris. +Tenderness with dorsal palpation of nailplates.No hyperkeratotic nor porokeratotic lesions noted b/l. ? ?Musculoskeletal Examination: ?Muscle strength 5/5 to all lower extremity muscle groups bilaterally. Hammertoe deformity noted 2-5 b/l. ? ?Radiographs: None ?  ?Assessment/Plan: ?1. Pain due to onychomycosis of toenails of both feet   ?2. PAD (peripheral artery disease) (HCC)   ?  ?-Examined patient. ?-Patient to continue soft, supportive shoe gear daily. ?-Toenails 1-5 b/l were debrided in length and girth with sterile nail nippers and dremel without iatrogenic bleeding.  ?-Patient/POA to call should there be question/concern in the interim.  ? ?Return in  about 4 months (around 09/07/2021). ? ?Freddie Breech, DPM  ?

## 2021-05-29 ENCOUNTER — Telehealth: Payer: Self-pay | Admitting: Physical Medicine and Rehabilitation

## 2021-05-29 NOTE — Telephone Encounter (Signed)
Pt called to set an appt. Pt states she will be available until 1:30 pm and to please call before 1:30 pm  at [phone].

## 2021-06-03 ENCOUNTER — Telehealth: Payer: Self-pay | Admitting: Physical Medicine and Rehabilitation

## 2021-06-03 DIAGNOSIS — H353221 Exudative age-related macular degeneration, left eye, with active choroidal neovascularization: Secondary | ICD-10-CM | POA: Diagnosis not present

## 2021-06-03 NOTE — Telephone Encounter (Signed)
Pt returned call to [facility] to set an appt. Pt will listen out for return call. Pt phone number is [phone].

## 2021-06-04 ENCOUNTER — Ambulatory Visit: Payer: Medicare PPO | Admitting: Physical Medicine and Rehabilitation

## 2021-06-04 ENCOUNTER — Encounter: Payer: Self-pay | Admitting: Physical Medicine and Rehabilitation

## 2021-06-04 DIAGNOSIS — M48061 Spinal stenosis, lumbar region without neurogenic claudication: Secondary | ICD-10-CM | POA: Diagnosis not present

## 2021-06-04 DIAGNOSIS — M5416 Radiculopathy, lumbar region: Secondary | ICD-10-CM | POA: Diagnosis not present

## 2021-06-04 DIAGNOSIS — M4726 Other spondylosis with radiculopathy, lumbar region: Secondary | ICD-10-CM

## 2021-06-04 DIAGNOSIS — M48062 Spinal stenosis, lumbar region with neurogenic claudication: Secondary | ICD-10-CM

## 2021-06-04 MED ORDER — GABAPENTIN 100 MG PO CAPS: ORAL_CAPSULE | ORAL | 1 refills | Status: DC

## 2021-06-04 MED ORDER — TRAMADOL HCL 50 MG PO TABS: 50.0000 mg | ORAL_TABLET | Freq: Three times a day (TID) | ORAL | 0 refills | Status: DC | PRN

## 2021-06-04 NOTE — Progress Notes (Signed)
Sharon Bailey - 87 y.o. female MRN [id number]  Date of birth: [birth year]  Office Visit Note: Visit Date: 06/04/2021 PCP: Clovis Riley, L.August Saucer, MD Referred by: Clovis Riley, L.August Saucer, MD  Subjective: Chief Complaint  Patient presents with   Lower Back - Follow-up   HPI: Sharon Bailey is a 88 y.o. female who comes in today for evaluation of chronic, worsening and severe bilateral lower back pain radiating down left leg to foot. Patient is somewhat of a poor historian. Patient reports pain has been ongoing for several years and is exacerbated by movement and activity. She describes her pain as a constant aching and sore sensation,  describes left leg pain as tingling, heavy and full of fluid, currently rates pain as 8 out of 10. Patient reports some relief of pain with rest and use of medications. Patient states she does take Tramadol as needed for moderate/severe pain. Patient continues to take 100 mg Gabapentin at night, has tolerated without issues. Patients lumbar MRI from 2021 exhibits lumbar scoliosis, multi-level facet hypertrophy, mild spinal canal stenosis and moderate to severe subarticular stenosis on the left at L2-L3 and moderate spinal canal stenosis noted at L3-L4. No high grade spinal canal stenosis noted. Patient has underwent multiple lumbar injection procedures in our office over the last several years, most recent was left L4-L5 interlaminar epidural steroid injection on 04/22/2021, she reports no relief of pain with this procedure. Prior to lumbar epidural steroid injection in April she underwent bilateral L4-L5 radiofrequency ablation performed in our office in February and reports that this procedure worsened her pain. Patient states she feels lumbar injections have not helped to alleviate her pain, also states conservative treatments do not seem to significantly decrease pain. Patient denies focal weakness. Patient denies recent trauma or falls.   Review of Systems  Musculoskeletal:   Positive for back pain.  Neurological:  Positive for tingling. Negative for focal weakness and weakness.  All other systems reviewed and are negative. Otherwise per HPI.  Assessment & Plan: Visit Diagnoses:    ICD-10-CM   1. Lumbar radiculopathy  M54.16     2. Other spondylosis with radiculopathy, lumbar region  M47.26     3. Spinal stenosis of lumbar region with neurogenic claudication  M48.062     4. Bilateral stenosis of lateral recess of lumbar spine  M48.061        Plan: Findings:  Chronic, worsening and severe bilateral lower back pain radiating down left leg to foot. Patient continues to have severe pain despite good conservative therapies such as rest and use of medications. Patients clinical presentation and exam are consistent with multi-factorial stenosis,  distribution is consistent with L4/L5 nerve pattern. I did speak with patient in detail about treatment plan and medication management. I do not think repeating lumbar injections at this time would be beneficial. We believe the next step is to increase Gabapentin as tolerated. I also placed prescription for short course of Tramadol. I do feel if patient continues to require short course of Tramadol this would be better managed by her primary care provider. Patient encouraged to remain active, instructed to let us know if she is unable to tolerate increased dose of Gabapentin, she can follow up as needed. No red flag symptoms noted upon exam today.    Meds & Orders:  Meds ordered this encounter  Medications   gabapentin (NEURONTIN) 100 MG capsule    Sig: Take 1 tablet (100 mg) by mouth twice daily for 1 week, then increase  to 1 tablet (100 mg) three times a day.    Dispense:  90 capsule    Refill:  1    Order Specific Question:   Supervising Provider    Answer:   Dennard Nip   traMADol (ULTRAM) 50 MG tablet    Sig: Take 1 tablet (50 mg total) by mouth every 8 (eight) hours as needed for moderate pain or severe  pain.    Dispense:  25 tablet    Refill:  0    Order Specific Question:   Supervising Provider    Answer:   Magnus Sinning [id number]   No orders of the defined types were placed in this encounter.   Follow-up: No follow-ups on file.   Procedures: No procedures performed      Clinical History: EXAM: MRI LUMBAR SPINE WITHOUT CONTRAST   TECHNIQUE: Multiplanar, multisequence MR imaging of the lumbar spine was performed. No intravenous contrast was administered.   COMPARISON:  Lumbar radiographs 09/21/2017   FINDINGS: Segmentation:  Normal   Alignment: 5 mm retrolisthesis L2-3. 3 mm retrolisthesis L4-5. 5 mm anterolisthesis L4-5.   Moderate lumbar scoliosis.   Vertebrae: Negative for fracture or mass. Hemangioma T12 vertebral body.   Conus medullaris and cauda equina: Conus extends to the L1 level. Conus and cauda equina appear normal.   Paraspinal and other soft tissues: Negative for paraspinous mass or adenopathy.   Disc levels:   T12-L1: Negative   L1-2: Mild disc and mild facet degeneration.  Negative for stenosis   L2-3: Disc degeneration which is asymmetric on the left. Severe disc space narrowing on the left with associated endplate spurring. Moderate facet hypertrophy bilaterally. Moderate to severe subarticular and foraminal stenosis on the left. Mild spinal stenosis.   L3-4: Asymmetric disc degeneration on the right with disc space narrowing and spurring. Moderate to severe subarticular foraminal stenosis on the right. Moderate facet hypertrophy. Moderate spinal stenosis. Mild subarticular stenosis on the left   L4-5: Broad-based central disc protrusion. Severe facet degeneration bilaterally. Moderate subarticular stenosis on the right. Mild subarticular stenosis on the left. Mild spinal stenosis   L5-S1: Bilateral facet hypertrophy. No disc protrusion or neural impingement.   IMPRESSION: Lumbar scoliosis and multilevel degenerative changes  above   Mild spinal stenosis and moderate to severe subarticular foraminal stenosis on the left at L2-3 due to spurring   Moderate to severe subarticular foraminal stenosis on the right due to spurring at L3-4. Moderate spinal stenosis   Central disc protrusion with mild spinal stenosis L4-5. Moderate subarticular stenosis on the right.     Electronically Signed   By: Franchot Gallo M.D.   On: 08/07/2019 16:06   She reports that she has never smoked. She has never used smokeless tobacco. No results for input(s): HGBA1C, LABURIC in the last 8760 hours.  Objective:  VS:  HT:    WT:   BMI:     BP:   HR: bpm  TEMP: ( )  RESP:  Physical Exam Vitals and nursing note reviewed.  HENT:     Head: Normocephalic and atraumatic.     Right Ear: External ear normal.     Left Ear: External ear normal.     Nose: Nose normal.     Mouth/Throat:     Mouth: Mucous membranes are moist.  Eyes:     Pupils: Pupils are equal, round, and reactive to light.  Cardiovascular:     Rate and Rhythm: Normal rate.     Pulses: Normal  pulses.  Pulmonary:     Effort: Pulmonary effort is normal.  Abdominal:     General: Abdomen is flat. There is no distension.  Musculoskeletal:        General: Tenderness present.     Comments: Pt is slow to rise from seated position to standing. Good lumbar range of motion. Strong distal strength without clonus, no pain upon palpation of greater trochanters. Dysesthesias noted to left L4/L5 dermatomes. Sensation intact bilaterally. Walks independently, gait steady.   Skin:    General: Skin is warm and dry.     Capillary Refill: Capillary refill takes less than 2 seconds.  Neurological:     General: No focal deficit present.     Mental Status: She is alert and oriented to person, place, and time.  Psychiatric:        Mood and Affect: Mood normal.        Behavior: Behavior normal.    Ortho Exam  Imaging: No results found.  Past Medical/Family/Surgical/Social  History: Medications & Allergies reviewed per EMR, new medications updated. Patient Active Problem List   Diagnosis Date Noted   Allergic rhinitis 06/18/2020   Cerebrovascular disease 06/18/2020   Chronic kidney disease, stage 3 unspecified ([place]) 06/18/2020   Hypothyroidism 06/18/2020   Increased frequency of urination 06/18/2020   Senile purpura ([place]) 06/18/2020   Venous insufficiency of both lower extremities 01/11/2020   History of CVA (cerebrovascular accident) 09/06/2019   Hypertensive emergency 06/12/2019   Acute CVA (cerebrovascular accident) ([place]) 06/11/2019   Nausea and vomiting 06/11/2019   AKI (acute kidney injury) ([place]) 123456   Metabolic acidosis 123456   Asymmetrical hearing loss of left ear 10/31/2018   Obstructive sleep apnea 10/31/2018   Sensorineural hearing loss (SNHL) of both ears 10/31/2018   Low back pain 09/21/2017   Chronic fatigue 02/05/2017   Atypical chest pain 02/05/2017   Hyperlipidemia 02/05/2017   Palpitations 04/18/2016   Essential hypertension 04/18/2016   Hypertensive heart disease without heart failure 04/18/2016   Status post foot surgery 10/11/2012   Gout attack 09/28/2012   Hammer toe of left foot 09/28/2012   Pain in joint, ankle and foot 09/28/2012   Past Medical History:  Diagnosis Date   Abnormal EKG    Collagenous colitis    Collagenous colitis    Diverticulitis    Diverticulosis    Fluttering heart    GERD (gastroesophageal reflux disease)    Gout    Gout attack 09/28/2012   Hammer toe of left foot 09/28/2012   Hyperlipidemia    Hypertension    Hypertensive emergency    Hypothyroidism    Irregular heart beat    Pain in joint, ankle and foot 09/28/2012   Status post foot surgery 10/11/2012   Thyroid disease    Family History  Problem Relation Age of Onset   Breast cancer Mother    Memory loss Father    Heart disease Sister    Rheumatic fever Sister    Lung disease Brother    Colon cancer Neg Hx    Past  Surgical History:  Procedure Laterality Date   ABDOMINAL HYSTERECTOMY     ADENOIDECTOMY     CHOLECYSTECTOMY     Excision Ganglion Toe Left 09/29/2012   Lt #2 @ [place]   Hammer toe repair Left 09/29/2012   Lt #2 @ [place]   TONSILLECTOMY     Social History   Occupational History   Not on file  Tobacco Use   Smoking status: Never  Smokeless tobacco: Never  Vaping Use   Vaping Use: Never used  Substance and Sexual Activity   Alcohol use: No    Alcohol/week: 0.0 standard drinks   Drug use: No   Sexual activity: Not on file

## 2021-06-04 NOTE — Progress Notes (Signed)
Numeric Pain Rating Scale and Functional Assessment Average Pain 0   In the last MONTH (on 0-10 scale) has pain interfered with the following?  1. General activity like being  able to carry out your everyday physical activities such as walking, climbing stairs, carrying groceries, or moving a chair?  Rating(10)   +Driver, -BT, -Dye Allergies.     Has had several injections in the past and she still has back pain. She states that if she is sitting that she does not have any pain at all, only has pain with activity. Pain is in the lowest part of the back. Pain in the left leg that feels like it is full of fluid/heavy all the  time.

## 2021-06-11 DIAGNOSIS — H353231 Exudative age-related macular degeneration, bilateral, with active choroidal neovascularization: Secondary | ICD-10-CM | POA: Diagnosis not present

## 2021-06-11 DIAGNOSIS — Z961 Presence of intraocular lens: Secondary | ICD-10-CM | POA: Diagnosis not present

## 2021-06-23 DIAGNOSIS — H353221 Exudative age-related macular degeneration, left eye, with active choroidal neovascularization: Secondary | ICD-10-CM | POA: Diagnosis not present

## 2021-06-23 DIAGNOSIS — H35453 Secondary pigmentary degeneration, bilateral: Secondary | ICD-10-CM | POA: Diagnosis not present

## 2021-06-23 DIAGNOSIS — H35363 Drusen (degenerative) of macula, bilateral: Secondary | ICD-10-CM | POA: Diagnosis not present

## 2021-06-23 DIAGNOSIS — H353112 Nonexudative age-related macular degeneration, right eye, intermediate dry stage: Secondary | ICD-10-CM | POA: Diagnosis not present

## 2021-06-23 DIAGNOSIS — Z961 Presence of intraocular lens: Secondary | ICD-10-CM | POA: Diagnosis not present

## 2021-06-23 DIAGNOSIS — H31091 Other chorioretinal scars, right eye: Secondary | ICD-10-CM | POA: Diagnosis not present

## 2021-07-14 DIAGNOSIS — E039 Hypothyroidism, unspecified: Secondary | ICD-10-CM | POA: Diagnosis not present

## 2021-07-14 DIAGNOSIS — N183 Chronic kidney disease, stage 3 unspecified: Secondary | ICD-10-CM | POA: Diagnosis not present

## 2021-07-14 DIAGNOSIS — I679 Cerebrovascular disease, unspecified: Secondary | ICD-10-CM | POA: Diagnosis not present

## 2021-07-14 DIAGNOSIS — E7800 Pure hypercholesterolemia, unspecified: Secondary | ICD-10-CM | POA: Diagnosis not present

## 2021-07-14 DIAGNOSIS — J309 Allergic rhinitis, unspecified: Secondary | ICD-10-CM | POA: Diagnosis not present

## 2021-07-14 DIAGNOSIS — D692 Other nonthrombocytopenic purpura: Secondary | ICD-10-CM | POA: Diagnosis not present

## 2021-07-14 DIAGNOSIS — I129 Hypertensive chronic kidney disease with stage 1 through stage 4 chronic kidney disease, or unspecified chronic kidney disease: Secondary | ICD-10-CM | POA: Diagnosis not present

## 2021-07-14 DIAGNOSIS — M109 Gout, unspecified: Secondary | ICD-10-CM | POA: Diagnosis not present

## 2021-07-29 DIAGNOSIS — H353221 Exudative age-related macular degeneration, left eye, with active choroidal neovascularization: Secondary | ICD-10-CM | POA: Diagnosis not present

## 2021-09-16 ENCOUNTER — Encounter: Payer: Self-pay | Admitting: Podiatry

## 2021-09-16 ENCOUNTER — Ambulatory Visit: Payer: Medicare PPO | Admitting: Podiatry

## 2021-09-16 DIAGNOSIS — B351 Tinea unguium: Secondary | ICD-10-CM

## 2021-09-16 DIAGNOSIS — M79675 Pain in left toe(s): Secondary | ICD-10-CM | POA: Diagnosis not present

## 2021-09-16 DIAGNOSIS — I739 Peripheral vascular disease, unspecified: Secondary | ICD-10-CM | POA: Diagnosis not present

## 2021-09-16 DIAGNOSIS — M79674 Pain in right toe(s): Secondary | ICD-10-CM

## 2021-09-20 NOTE — Progress Notes (Signed)
  Subjective:  Patient ID: Sharon Bailey, female    DOB: 28-Aug-1930,  MRN: [id number]  88 y.o. female presents for at risk foot care. Patient has h/o PAD and painful elongated mycotic toenails 1-5 bilaterally which are tender when wearing enclosed shoe gear. Pain is relieved with periodic professional debridement.  New problem(s): None   PCP is Mitchell, L.Marlou Sa, MD , and last visit was July, 2023.  Allergies  Allergen Reactions   Sulfamethoxazole-Trimethoprim Diarrhea and Nausea Only    Other reaction(s): diarrhea   Review of Systems: Negative except as noted in the HPI.   Objective:  Sharon Bailey is a pleasant 88 y.o. female, WD, WN in NAD. AAO x 3.  Vascular Examination: Vascular status intact b/l with palpable DP pulses b/l. PT pulses nonpalpable b/l. CFT immediate b/l. No edema. No pain with calf compression b/l. Skin temperature gradient WNL b/l. Pedal hair absent.  Neurological Examination: Sensation grossly intact b/l with 10 gram monofilament. Vibratory sensation intact b/l.   Dermatological Examination: Pedal skin with normal turgor, texture and tone b/l. Toenails 1-5 b/l thick, discolored, elongated with subungual debris and pain on dorsal palpation. No hyperkeratotic lesions noted b/l.   Musculoskeletal Examination: Muscle strength 5/5 to b/l LE. Hammertoe deformity noted 2-5 b/l.  Radiographs: None Assessment:   1. Pain due to onychomycosis of toenails of both feet   2. PAD (peripheral artery disease) ([place])    Plan:  -Patient was evaluated and treated. All patient's and/or POA's questions/concerns answered on today's visit. -Consent given for treatment as described below: -Patient to continue soft, supportive shoe gear daily. -Mycotic toenails 1-5 bilaterally were debrided in length and girth with sterile nail nippers and dremel without incident. -Patient/POA to call should there be question/concern in the interim.  Return in about 4 months (around  01/16/2022).  Marzetta Board, DPM

## 2021-10-07 DIAGNOSIS — H353221 Exudative age-related macular degeneration, left eye, with active choroidal neovascularization: Secondary | ICD-10-CM | POA: Diagnosis not present

## 2021-10-20 ENCOUNTER — Ambulatory Visit (HOSPITAL_BASED_OUTPATIENT_CLINIC_OR_DEPARTMENT_OTHER): Payer: Medicare PPO | Admitting: Cardiology

## 2021-10-20 ENCOUNTER — Encounter (HOSPITAL_BASED_OUTPATIENT_CLINIC_OR_DEPARTMENT_OTHER): Payer: Self-pay | Admitting: Cardiology

## 2021-10-20 VITALS — BP 134/72 | HR 77 | Ht 63.0 in | Wt 155.4 lb

## 2021-10-20 DIAGNOSIS — I872 Venous insufficiency (chronic) (peripheral): Secondary | ICD-10-CM | POA: Diagnosis not present

## 2021-10-20 DIAGNOSIS — I10 Essential (primary) hypertension: Secondary | ICD-10-CM | POA: Diagnosis not present

## 2021-10-20 DIAGNOSIS — Z8673 Personal history of transient ischemic attack (TIA), and cerebral infarction without residual deficits: Secondary | ICD-10-CM | POA: Diagnosis not present

## 2021-10-20 DIAGNOSIS — R5382 Chronic fatigue, unspecified: Secondary | ICD-10-CM | POA: Diagnosis not present

## 2021-10-20 MED ORDER — AMLODIPINE BESYLATE 5 MG PO TABS: 5.0000 mg | ORAL_TABLET | Freq: Every day | ORAL | 3 refills | Status: DC

## 2021-10-20 NOTE — Patient Instructions (Signed)
Medication Instructions:  Continue current medication  *If you need a refill on your cardiac medications before your next appointment, please call your pharmacy*   Lab Work: None Ordered  Testing/Procedures: None Ordered   Follow-Up: At [facility], you and your health needs are our priority.  As part of our continuing mission to provide you with exceptional heart care, we have created designated Provider Care Teams.  These Care Teams include your primary Cardiologist (physician) and Advanced Practice Providers (APPs -  Physician Assistants and Nurse Practitioners) who all work together to provide you with the care you need, when you need it.  We recommend signing up for the patient portal called "MyChart".  Sign up information is provided on this After Visit Summary.  MyChart is used to connect with patients for Virtual Visits (Telemedicine).  Patients are able to view lab/test results, encounter notes, upcoming appointments, etc.  Non-urgent messages can be sent to your provider as well.   To learn more about what you can do with MyChart, go to [email or web address].    Your next appointment:   1 year(s)  The format for your next appointment:   In Person  Provider:   Buford Dresser, MD   Other Instructions

## 2021-10-20 NOTE — Progress Notes (Signed)
Cardiology Office Note:    Date:  10/20/2021   ID:  Sharon Bailey, DOB 01/26/1930, MRN [id number]  PCP:  Aurea Graff.Marlou Sa, MD  Cardiologist:  Buford Dresser, MD  Referring MD: Aurea Graff.Marlou Sa, MD   CC: follow up  History of Present Illness:    Sharon Bailey is a 88 y.o. female with a hx of hypertension, hyperlipidemia, history of CVA and gout. I first met her 08/26/18.  Of note, she usually gets anxious and has higher blood pressure readings in clinic. At home her blood pressure was averaging 145/75. She had not seen very low readings.  Today:  She is accompanied by a family member. She says she is doing pretty good. She mentions having back pain which has been aggravating her lately.  She states that she becomes tired easily and needs to sit down frequently.   Additionally, she reports that her bilateral lower extremities swell occasionally, with her right being worse than her left.   She endorses that she never gets enough sleep to feel rested. Throughout the night she has to get up to use the bathroom frequently, likely because she stays very well hydrated right up until she goes to sleep.    Her at home blood pressures have been around 145/70. She states that once in a while it will run high around 834 systolic.   She denies any palpitations, chest pain, or shortness of breath. No lightheadedness, headaches, syncope, orthopnea, or PND.  Past Medical History:  Diagnosis Date   Abnormal EKG    Collagenous colitis    Collagenous colitis    Diverticulitis    Diverticulosis    Fluttering heart    GERD (gastroesophageal reflux disease)    Gout    Gout attack 09/28/2012   Hammer toe of left foot 09/28/2012   Hyperlipidemia    Hypertension    Hypertensive emergency    Hypothyroidism    Irregular heart beat    Pain in joint, ankle and foot 09/28/2012   Status post foot surgery 10/11/2012   Thyroid disease    Past Surgical History:  Procedure Laterality Date    ABDOMINAL HYSTERECTOMY     ADENOIDECTOMY     CHOLECYSTECTOMY     Excision Ganglion Toe Left 09/29/2012   Lt #2 @ [place]   Hammer toe repair Left 09/29/2012   Lt #2 @ [place]   TONSILLECTOMY       No outpatient medications have been marked as taking for the 10/20/21 encounter (Appointment) with Buford Dresser, MD.     Allergies:   Sulfamethoxazole-trimethoprim   Social History   Tobacco Use   Smoking status: Never   Smokeless tobacco: Never  Vaping Use   Vaping Use: Never used  Substance Use Topics   Alcohol use: No    Alcohol/week: 0.0 standard drinks of alcohol   Drug use: No     Family Hx: The patient's family history includes Breast cancer in her mother; Heart disease in her sister; Lung disease in her brother; Memory loss in her father; Rheumatic fever in her sister. There is no history of Colon cancer.  ROS:   Please see the history of present illness.    (+) Bilateral LE edema (+) Back pain (+) Fatigue  All other systems reviewed and are negative.   Prior CV studies:    The following studies were reviewed today:  LLE Venous Doppler 07/05/2019: Summary:   LEFT:  - There is no evidence of deep vein thrombosis in the  lower extremity.     - No cystic structure found in the popliteal fossa.    Echo 06/12/19 1. Left ventricular ejection fraction, by estimation, is 65 to 70%. The  left ventricle has normal function. The left ventricle has no regional  wall motion abnormalities. There is severe left ventricular hypertrophy.  Left ventricular diastolic parameters   are consistent with Grade I diastolic dysfunction (impaired relaxation).  Elevated left ventricular end-diastolic pressure.   2. Right ventricular systolic function is normal. The right ventricular  size is normal. There is mildly elevated pulmonary artery systolic  pressure.   3. The mitral valve is grossly normal. Trivial mitral valve  regurgitation.   4. The aortic valve is tricuspid. Aortic valve  regurgitation is trivial.  Mild aortic valve stenosis. Aortic valve area, by VTI measures 1.67 cm.  Aortic valve mean gradient measures 11.0 mmHg. Aortic valve Vmax measures  2.50 m/s.   5. The inferior vena cava is normal in size with <50% respiratory  variability, suggesting right atrial pressure of 8 mmHg.   Lexiscan Myoview 08/31/2016: Nuclear stress EF: 69%. There was no ST segment deviation noted during stress. The study is normal. This is a low risk study. The left ventricular ejection fraction is hyperdynamic (>65%).   Normal pharmacologic nuclear stress test with no evidence for prior infarct or ischemia.   Labs/Other Tests and Data Reviewed:    EKG:  EKG is personally reviewed. 10/20/21: NSR at 77 bpm with IVCD in LBB pattern 10/15/2020: NSR, IVCD in left bundle pattern 10/22/2019: NSR, IVCD in left bundle pattern  Recent Labs: 11/20/2020: BUN 23; Creatinine, Ser 1.31; Potassium 4.4; Sodium 145   Recent Lipid Panel Lab Results  Component Value Date/Time   CHOL 140 06/12/2019 07:11 AM   TRIG 105 06/12/2019 07:11 AM   HDL 47 06/12/2019 07:11 AM   CHOLHDL 3.0 06/12/2019 07:11 AM   LDLCALC 72 06/12/2019 07:11 AM   Objective:    Vital Signs:  BP 134/72 (BP Location: Right Arm, Patient Position: Sitting, Cuff Size: Normal)   Pulse 77   Ht _0  (1.6 m)   Wt 155 lb 6.4 oz (70.5 kg)   BMI 27.53 kg/m    Wt Readings from Last 3 Encounters:  02/05/21 157 lb 2 oz (71.3 kg)  10/15/20 152 lb 3.2 oz (69 kg)  02/22/20 155 lb (70.3 kg)   GEN: Well nourished, well developed in no acute distress HEENT: Normal, moist mucous membranes NECK: No JVD CARDIAC: regular rhythm, normal S1 and S2, no rubs or gallops. 2/6 systolic murmur. VASCULAR: Radial and DP pulses 2+ bilaterally. No carotid bruits RESPIRATORY:  Clear to auscultation without rales, wheezing or rhonchi  ABDOMEN: Soft, non-tender, non-distended MUSCULOSKELETAL:  Ambulates independently SKIN: Warm and dry. Lower  extremity discoloration with mild nonpitting edema consistent with venous insufficiency. No erythema or warmth. NEUROLOGIC:  Alert and oriented x 3. No focal neuro deficits noted. PSYCHIATRIC:  Normal affect   ASSESSMENT & PLAN:    1. Essential hypertension   2. Venous insufficiency of both lower extremities   3. Chronic fatigue   4. History of CVA (cerebrovascular accident)    Hypertension:  -improved in the office today -continue valsartan -continue amlodipine 5 mg daily. With LE edema, will hold on increasing this today, but may need to increase in the future -has had carvedilol stopped, unclear when/why. Could restart low dose if needed in the future -has said she does not want diuretic/anything that makes her urinate more. We  discussed chlorthalidone given her mild LE edema, but she does not wish to pursue  Chronic venous insufficiency -discussed compression, elevation   Chronic fatigue: -feels she is coping well, no changes -has had echo, monitor, and stress test (while having symptoms) that have not shown cardiac etiology   History of CVA Cardiac risk counseling and primary prevention recommendations -see prior discussion re: statins. Has tolerated simvastatin, cannot raise dose due to amlodipine interaction -counseled on red flag warning signs -last LDL 80. Ideal goal <70. Declines changes today -on aspirin 81 mg daily   Plan for follow up: 1 year.  Orders Placed This Encounter  Procedures   EKG 12-Lead    Meds ordered this encounter  Medications   amLODipine (NORVASC) 5 MG tablet    Sig: Take 1 tablet (5 mg total) by mouth daily.    Dispense:  90 tablet    Refill:  3    Patient Instructions  Medication Instructions:  Continue current medication  *If you need a refill on your cardiac medications before your next appointment, please call your pharmacy*   Lab Work: None Ordered  Testing/Procedures: None Ordered   Follow-Up: At [facility],  you and your health needs are our priority.  As part of our continuing mission to provide you with exceptional heart care, we have created designated Provider Care Teams.  These Care Teams include your primary Cardiologist (physician) and Advanced Practice Providers (APPs -  Physician Assistants and Nurse Practitioners) who all work together to provide you with the care you need, when you need it.  We recommend signing up for the patient portal called "MyChart".  Sign up information is provided on this After Visit Summary.  MyChart is used to connect with patients for Virtual Visits (Telemedicine).  Patients are able to view lab/test results, encounter notes, upcoming appointments, etc.  Non-urgent messages can be sent to your provider as well.   To learn more about what you can do with MyChart, go to [email or web address].    Your next appointment:   1 year(s)  The format for your next appointment:   In Person  Provider:   Buford Dresser, MD   Other Instructions            I,Breanna Adamick,acting as a scribe for Buford Dresser, MD.,have documented all relevant documentation on the behalf of Buford Dresser, MD,as directed by  Buford Dresser, MD while in the presence of Buford Dresser, MD.   I, Buford Dresser, MD, have reviewed all documentation for this visit. The documentation on 10/20/21 for the exam, diagnosis, procedures, and orders are all accurate and complete.   Signed, Buford Dresser, MD  10/20/2021     [facility]

## 2021-10-23 ENCOUNTER — Other Ambulatory Visit (HOSPITAL_BASED_OUTPATIENT_CLINIC_OR_DEPARTMENT_OTHER): Payer: Self-pay | Admitting: Cardiology

## 2021-10-23 ENCOUNTER — Telehealth: Payer: Self-pay | Admitting: Physical Medicine and Rehabilitation

## 2021-10-23 DIAGNOSIS — I10 Essential (primary) hypertension: Secondary | ICD-10-CM

## 2021-10-23 NOTE — Telephone Encounter (Signed)
Rx request sent to pharmacy.  

## 2021-10-23 NOTE — Telephone Encounter (Signed)
Pt called requesting a call back to set an appt with Dr Ernestina Patches. Please call pt at [phone]

## 2021-10-23 NOTE — Telephone Encounter (Signed)
Will you please hold to follow up on this and remind Megan?

## 2021-10-24 NOTE — Telephone Encounter (Signed)
Patient was calling to set up an appointment for her lower back pain. Appointment scheduled

## 2021-10-27 ENCOUNTER — Ambulatory Visit: Payer: Medicare PPO | Admitting: Physical Medicine and Rehabilitation

## 2021-10-27 ENCOUNTER — Encounter: Payer: Self-pay | Admitting: Physical Medicine and Rehabilitation

## 2021-10-27 VITALS — BP 156/70 | HR 102

## 2021-10-27 DIAGNOSIS — M5416 Radiculopathy, lumbar region: Secondary | ICD-10-CM

## 2021-10-27 DIAGNOSIS — M4726 Other spondylosis with radiculopathy, lumbar region: Secondary | ICD-10-CM

## 2021-10-27 DIAGNOSIS — M48062 Spinal stenosis, lumbar region with neurogenic claudication: Secondary | ICD-10-CM | POA: Diagnosis not present

## 2021-10-27 DIAGNOSIS — G894 Chronic pain syndrome: Secondary | ICD-10-CM

## 2021-10-27 DIAGNOSIS — M48061 Spinal stenosis, lumbar region without neurogenic claudication: Secondary | ICD-10-CM | POA: Diagnosis not present

## 2021-10-27 MED ORDER — PREGABALIN 50 MG PO CAPS: ORAL_CAPSULE | ORAL | 1 refills | Status: DC

## 2021-10-27 MED ORDER — TRAMADOL HCL 50 MG PO TABS: 50.0000 mg | ORAL_TABLET | Freq: Three times a day (TID) | ORAL | 0 refills | Status: DC | PRN

## 2021-10-27 NOTE — Progress Notes (Unsigned)
Sharon Bailey - 88 y.o. female MRN [id number]  Date of birth: 05-26-1930  Office Visit Note: Visit Date: 10/27/2021 PCP: Alroy Dust, L.Marlou Sa, MD Referred by: Alroy Dust, L.Marlou Sa, MD  Subjective: Chief Complaint  Patient presents with   Lower Back - Pain   HPI: [name] is a 88 y.o. female who comes in today for evaluation of chronic, worsening and severe bilateral lower back pain radiating down left leg to foot. Pain ongoing for several years and worsens with movement and activity. She describes her pain as a sore and tingling sensation. She reports some relief of pain with medications. Reports she did take Gabapentin 100 mg three times a day for 2 months, states she stopped taking as this medication did not help to alleviate her pain. Does take Tramadol as needed for moderate/severe pain, states she ran out of this medication recently. Patient is tearful, anxious and restless during our visit today. She voiced severe stress at this time as her granddaughter's wedding is in 2 weeks. She is concerned her pain will not allow her to attend function. Patients lumbar MRI from 2021 exhibits lumbar scoliosis, multi-level facet hypertrophy, mild spinal canal stenosis and moderate to severe subarticular stenosis on the left at L2-L3. There is moderate spinal canal stenosis noted at L3-L4. Patient has underwent multiple lumbar injection procedures in our office over the last several years, most recent was left L4-L5 interlaminar epidural steroid injection on 04/22/2021, she reports no relief of pain with this procedure. Prior to lumbar epidural steroid injection in April she underwent bilateral L4-L5 radiofrequency ablation performed in our office in February and reports that this procedure worsened her pain. Patient denies focal weakness. Patient denies recent trauma or falls.    Review of Systems  Musculoskeletal:  Positive for back pain.  Neurological:  Positive for tingling. Negative for focal weakness  and weakness.  Psychiatric/Behavioral:  The patient is nervous/anxious.   All other systems reviewed and are negative.  Otherwise per HPI.  Assessment & Plan: Visit Diagnoses:    ICD-10-CM   1. Lumbar radiculopathy  M54.16     2. Other spondylosis with radiculopathy, lumbar region  M47.26     3. Spinal stenosis of lumbar region with neurogenic claudication  M48.062     4. Bilateral stenosis of lateral recess of lumbar spine  M48.061     5. Chronic pain syndrome  G89.4        Plan: Findings:  Chronic, worsening and severe bilateral lower back pain radiating down left leg to foot. Patient continues to have severe pain despite good conservative therapies such as rest and use of medications. Patients clinical presentation and exam are consistent with neurogenic claudication as a result of spinal canal stenosis. There is moderate multifactorial spinal canal stenosis noted at the level of L3-L4. No relief with previous interventional spine procedures, we do not believe repeating lumbar injections at this time would be beneficial. Patient is not ideal surgical candidate due to advanced age and other chronic medical conditions.   I did speak with patient about medication management, doesn't seem that Tramadol is consistently helping to alleviate her pain. I did agree to refill short course of Tramadol and instructed her to take 2 tablets of 500 mg Tylenol with the Tramadol. I also prescribed Lyrica 50 mg, once at night for a week then can tritiate up to 50 mg twice a day. If patient wishes to continue on long term pain management with opioid medication we feel she needs  to follow up with her primary care provider, she could benefit from care at comprehensive pain management facility. If patient continues to experience high stress/anxiety we do think she could benefit from psychiatric evaluation and possible medication management. No red flag symptoms noted upon exam today.     Meds & Orders: No  orders of the defined types were placed in this encounter.  No orders of the defined types were placed in this encounter.   Follow-up: No follow-ups on file.   Procedures: No procedures performed      Clinical History: EXAM: MRI LUMBAR SPINE WITHOUT CONTRAST   TECHNIQUE: Multiplanar, multisequence MR imaging of the lumbar spine was performed. No intravenous contrast was administered.   COMPARISON:  Lumbar radiographs 09/21/2017   FINDINGS: Segmentation:  Normal   Alignment: 5 mm retrolisthesis L2-3. 3 mm retrolisthesis L4-5. 5 mm anterolisthesis L4-5.   Moderate lumbar scoliosis.   Vertebrae: Negative for fracture or mass. Hemangioma T12 vertebral body.   Conus medullaris and cauda equina: Conus extends to the L1 level. Conus and cauda equina appear normal.   Paraspinal and other soft tissues: Negative for paraspinous mass or adenopathy.   Disc levels:   T12-L1: Negative   L1-2: Mild disc and mild facet degeneration.  Negative for stenosis   L2-3: Disc degeneration which is asymmetric on the left. Severe disc space narrowing on the left with associated endplate spurring. Moderate facet hypertrophy bilaterally. Moderate to severe subarticular and foraminal stenosis on the left. Mild spinal stenosis.   L3-4: Asymmetric disc degeneration on the right with disc space narrowing and spurring. Moderate to severe subarticular foraminal stenosis on the right. Moderate facet hypertrophy. Moderate spinal stenosis. Mild subarticular stenosis on the left   L4-5: Broad-based central disc protrusion. Severe facet degeneration bilaterally. Moderate subarticular stenosis on the right. Mild subarticular stenosis on the left. Mild spinal stenosis   L5-S1: Bilateral facet hypertrophy. No disc protrusion or neural impingement.   IMPRESSION: Lumbar scoliosis and multilevel degenerative changes above   Mild spinal stenosis and moderate to severe subarticular  foraminal stenosis on the left at L2-3 due to spurring   Moderate to severe subarticular foraminal stenosis on the right due to spurring at L3-4. Moderate spinal stenosis   Central disc protrusion with mild spinal stenosis L4-5. Moderate subarticular stenosis on the right.     Electronically Signed   By: Franchot Gallo M.D.   On: 08/07/2019 16:06   She reports that she has never smoked. She has never used smokeless tobacco. No results for input(s): "HGBA1C", "LABURIC" in the last 8760 hours.  Objective:  VS:  HT:    WT:   BMI:     BP:(!) 156/70  HR:(!) 102bpm  TEMP: ( )  RESP:  Physical Exam Vitals and nursing note reviewed.  HENT:     Head: Normocephalic and atraumatic.     Right Ear: External ear normal.     Left Ear: External ear normal.     Nose: Nose normal.     Mouth/Throat:     Mouth: Mucous membranes are moist.  Eyes:     Extraocular Movements: Extraocular movements intact.  Cardiovascular:     Rate and Rhythm: Normal rate.     Pulses: Normal pulses.  Pulmonary:     Effort: Pulmonary effort is normal.  Abdominal:     General: Abdomen is flat. There is no distension.  Musculoskeletal:        General: Tenderness present.     Cervical back: Normal  range of motion.     Comments: Pt is slow to rise from seated position to standing. Good lumbar range of motion. Strong distal strength without clonus, no pain upon palpation of greater trochanters. Sensation intact bilaterally. Walks independently, gait steady.    Skin:    General: Skin is warm and dry.     Capillary Refill: Capillary refill takes less than 2 seconds.  Neurological:     General: No focal deficit present.     Mental Status: She is alert and oriented to person, place, and time.  Psychiatric:        Mood and Affect: Mood is anxious. Affect is tearful.     Ortho Exam  Imaging: No results found.  Past Medical/Family/Surgical/Social History: Medications & Allergies reviewed per EMR, new  medications updated. Patient Active Problem List   Diagnosis Date Noted   Allergic rhinitis 06/18/2020   Cerebrovascular disease 06/18/2020   Chronic kidney disease, stage 3 unspecified ([place]) 06/18/2020   Hypothyroidism 06/18/2020   Increased frequency of urination 06/18/2020   Senile purpura ([place]) 06/18/2020   Venous insufficiency of both lower extremities 01/11/2020   History of CVA (cerebrovascular accident) 09/06/2019   Hypertensive emergency 06/12/2019   Acute CVA (cerebrovascular accident) ([place]) 06/11/2019   Nausea and vomiting 06/11/2019   AKI (acute kidney injury) ([place]) 123456   Metabolic acidosis 123456   Asymmetrical hearing loss of left ear 10/31/2018   Obstructive sleep apnea 10/31/2018   Sensorineural hearing loss (SNHL) of both ears 10/31/2018   Low back pain 09/21/2017   Chronic fatigue 02/05/2017   Atypical chest pain 02/05/2017   Hyperlipidemia 02/05/2017   Palpitations 04/18/2016   Essential hypertension 04/18/2016   Hypertensive heart disease without heart failure 04/18/2016   Status post foot surgery 10/11/2012   Gout attack 09/28/2012   Hammer toe of left foot 09/28/2012   Pain in joint, ankle and foot 09/28/2012   Past Medical History:  Diagnosis Date   Abnormal EKG    Collagenous colitis    Collagenous colitis    Diverticulitis    Diverticulosis    Fluttering heart    GERD (gastroesophageal reflux disease)    Gout    Gout attack 09/28/2012   Hammer toe of left foot 09/28/2012   Hyperlipidemia    Hypertension    Hypertensive emergency    Hypothyroidism    Irregular heart beat    Pain in joint, ankle and foot 09/28/2012   Status post foot surgery 10/11/2012   Thyroid disease    Family History  Problem Relation Age of Onset   Breast cancer Mother    Memory loss Father    Heart disease Sister    Rheumatic fever Sister    Lung disease Brother    Colon cancer Neg Hx    Past Surgical History:  Procedure Laterality Date   ABDOMINAL  HYSTERECTOMY     ADENOIDECTOMY     CHOLECYSTECTOMY     Excision Ganglion Toe Left 09/29/2012   Lt #2 @ [facility]   Hammer toe repair Left 09/29/2012   Lt #2 @ [facility]   TONSILLECTOMY     Social History   Occupational History   Not on file  Tobacco Use   Smoking status: Never   Smokeless tobacco: Never  Vaping Use   Vaping Use: Never used  Substance and Sexual Activity   Alcohol use: No    Alcohol/week: 0.0 standard drinks of alcohol   Drug use: No   Sexual activity: Not on file

## 2021-10-27 NOTE — Progress Notes (Unsigned)
Numeric Pain Rating Scale and Functional Assessment Average Pain 2   In the last MONTH (on 0-10 scale) has pain interfered with the following?  1. General activity like being  able to carry out your everyday physical activities such as walking, climbing stairs, carrying groceries, or moving a chair?  Rating(9)   +Driver, -BT, -Dye Allergies.  Walking and standing makes pain worse. Pain across lower back. Pain radiates down legs

## 2021-10-30 ENCOUNTER — Encounter (HOSPITAL_BASED_OUTPATIENT_CLINIC_OR_DEPARTMENT_OTHER): Payer: Self-pay

## 2021-10-30 ENCOUNTER — Emergency Department (HOSPITAL_BASED_OUTPATIENT_CLINIC_OR_DEPARTMENT_OTHER)
Admission: EM | Admit: 2021-10-30 | Discharge: 2021-10-30 | Disposition: A | Discharge status: Home / Self Care | Payer: Medicare PPO | Attending: Emergency Medicine | Admitting: Emergency Medicine

## 2021-10-30 ENCOUNTER — Other Ambulatory Visit: Payer: Self-pay

## 2021-10-30 ENCOUNTER — Emergency Department (HOSPITAL_BASED_OUTPATIENT_CLINIC_OR_DEPARTMENT_OTHER): Payer: Medicare PPO | Admitting: Radiology

## 2021-10-30 DIAGNOSIS — T07XXXA Unspecified multiple injuries, initial encounter: Secondary | ICD-10-CM | POA: Diagnosis not present

## 2021-10-30 DIAGNOSIS — W109XXA Fall (on) (from) unspecified stairs and steps, initial encounter: Secondary | ICD-10-CM | POA: Diagnosis not present

## 2021-10-30 DIAGNOSIS — S81811A Laceration without foreign body, right lower leg, initial encounter: Secondary | ICD-10-CM | POA: Insufficient documentation

## 2021-10-30 DIAGNOSIS — Z79899 Other long term (current) drug therapy: Secondary | ICD-10-CM | POA: Diagnosis not present

## 2021-10-30 DIAGNOSIS — I10 Essential (primary) hypertension: Secondary | ICD-10-CM | POA: Diagnosis not present

## 2021-10-30 DIAGNOSIS — W19XXXA Unspecified fall, initial encounter: Secondary | ICD-10-CM

## 2021-10-30 DIAGNOSIS — M79661 Pain in right lower leg: Secondary | ICD-10-CM | POA: Diagnosis not present

## 2021-10-30 DIAGNOSIS — S6991XA Unspecified injury of right wrist, hand and finger(s), initial encounter: Secondary | ICD-10-CM | POA: Diagnosis not present

## 2021-10-30 DIAGNOSIS — S8991XA Unspecified injury of right lower leg, initial encounter: Secondary | ICD-10-CM | POA: Diagnosis present

## 2021-10-30 DIAGNOSIS — Z043 Encounter for examination and observation following other accident: Secondary | ICD-10-CM | POA: Diagnosis not present

## 2021-10-30 MED ORDER — HYDROCODONE-ACETAMINOPHEN 5-325 MG PO TABS: 2.0000 | ORAL_TABLET | ORAL | 0 refills | Status: DC | PRN

## 2021-10-30 MED ORDER — LIDOCAINE-EPINEPHRINE (PF) 2 %-1:200000 IJ SOLN
10.0000 mL | Freq: Once | INTRAMUSCULAR | Status: AC
  Administered 2021-10-30: 10 mL
  Filled 2021-10-30: qty 20

## 2021-10-30 MED ORDER — CEPHALEXIN 500 MG PO CAPS: 500.0000 mg | ORAL_CAPSULE | Freq: Four times a day (QID) | ORAL | 0 refills | Status: DC

## 2021-10-30 MED ORDER — IBUPROFEN 400 MG PO TABS
600.0000 mg | ORAL_TABLET | Freq: Once | ORAL | Status: AC
  Administered 2021-10-30: 600 mg via ORAL
  Filled 2021-10-30: qty 1

## 2021-10-30 NOTE — Discharge Instructions (Addendum)
Note the work-up today was overall reassuring.  No evidence of acute fracture.  Take antibiotics as directed 4 times a day for the next 5 days as we saw exposed bone during laceration..  Take ibuprofen as needed for baseline pain and pain medicine for breakthrough pain.  Recommend reevaluation by primary care in 3 to 5 days.  Please not hesitate to return to emergency department for worrisome signs and as we discussed, parent.

## 2021-10-30 NOTE — ED Provider Notes (Signed)
[facility] EMERGENCY DEPT Provider Note   CSN: [id number] Arrival date & time: 10/30/21  1614     History  Chief Complaint  Patient presents with   Marletta Lor    Sharon Bailey is a 88 y.o. female.   Fall   88 year old female presents emergency department after fall.  Patient states that fall was mechanical when she missed a step walking outside falling on her left side.  She denies trauma to head, loss of consciousness or current blood thinner use.  She states that she was able to scoot to a nearby step and called her daughter promptly.  States she was on the ground approximately 45 minutes before her daughter came to help her up.  She states that since then, she has been able to ambulate with some pain of her left hip.  She had abrasion to her right lower extremity as well as right wrist.  Bleeding stopped with direct pressure.  She denies visual disturbance, slurred speech, facial droop, weakness/sensory deficits, chest pain, shortness of breath, abdominal pain, back pain.  Past medical history significant for GERD, gout, hypertensive emergency, colitis, hyperlipidemia, hypertension  Home Medications Prior to Admission medications   Medication Sig Start Date End Date Taking? Authorizing Provider  cephALEXin (KEFLEX) 500 MG capsule Take 1 capsule (500 mg total) by mouth 4 (four) times daily. 10/30/21  Yes Sherian Maroon A, PA  allopurinol (ZYLOPRIM) 300 MG tablet Take 300 mg by mouth daily.    [provider]  amLODipine (NORVASC) 5 MG tablet Take 1 tablet (5 mg total) by mouth daily. 10/20/21   Jodelle Red, MD  budesonide (ENTOCORT EC) 3 MG 24 hr capsule TAKE 1 CAPSULE EVERY DAY AS DIRECTED 04/14/21   Hilarie Fredrickson, MD  carvedilol (COREG) 25 MG tablet Take by mouth. 03/09/18   [provider]  ibuprofen (ADVIL) 200 MG tablet Take by mouth.    [provider]  levothyroxine (SYNTHROID) 88 MCG tablet Take 88 mcg by mouth daily before  breakfast.    [provider]  pregabalin (LYRICA) 50 MG capsule 1 tablet at night for one week, then 1 tablet in the morning and 1 tablet at night. 10/27/21   Juanda Chance, NP  simvastatin (ZOCOR) 40 MG tablet Take 40 mg by mouth daily.    [provider]  traMADol (ULTRAM) 50 MG tablet Take 1 tablet (50 mg total) by mouth every 8 (eight) hours as needed for moderate pain or severe pain. 10/27/21   Juanda Chance, NP  valsartan (DIOVAN) 160 MG tablet Take 1 tablet (160 mg total) by mouth daily. 10/23/21   Jodelle Red, MD  vitamin B-12 1000 MCG tablet Take 1 tablet (1,000 mcg total) by mouth daily. 06/14/19   Joseph Art, DO      Allergies    Sulfamethoxazole-trimethoprim    Review of Systems   Review of Systems  All other systems reviewed and are negative.   Physical Exam Updated Vital Signs BP 128/69   Pulse 88   Temp 98.1 F (36.7 C)   Resp 18   Ht 5\' 3"  (1.6 m)   Wt 70.5 kg   SpO2 100%   BMI 27.53 kg/m  Physical Exam Vitals and nursing note reviewed.  Constitutional:      General: She is not in acute distress.    Appearance: She is well-developed.  HENT:     Head: Normocephalic and atraumatic.  Eyes:     Conjunctiva/sclera: Conjunctivae normal.  Cardiovascular:     Rate and Rhythm: Normal rate and regular rhythm.  Pulmonary:     Effort: Pulmonary effort is normal. No respiratory distress.     Breath sounds: Normal breath sounds. No wheezing or rales.  Abdominal:     Palpations: Abdomen is soft.     Tenderness: There is no abdominal tenderness. There is no guarding.  Musculoskeletal:        General: No swelling.     Cervical back: Neck supple.     Comments: No midline tenderness to cervical, thoracic, lumbar spine with no obvious step-off or deformity noted.  No tenderness palpation of anterior posterior chest wall.  No bony tenderness of upper or lower extremities.  Skin:    General: Skin is warm and dry.     Capillary  Refill: Capillary refill takes less than 2 seconds.     Comments: 6-7 inch semicircular skin tear noted on patient's right lower extremity with deeper 9.0 cm laceration noted in the middle.  No obvious foreign body.  Similar 1 to 2 inch laceration noted on patient's right wrist x2.  Neurological:     Mental Status: She is alert.     Comments: Alert and oriented to self, place, time and event.   Speech is fluent, clear without dysarthria or dysphasia.   Strength 5/5 in upper/lower extremities   Sensation intact in upper/lower extremities   Normal finger-to-nose and feet tapping.  CN I not tested  CN II grossly intact visual fields bilaterally. Did not visualize posterior eye.  CN III, IV, VI PERRLA and EOMs intact bilaterally  CN V Intact sensation to sharp and light touch to the face  CN VII facial movements symmetric  CN VIII not tested  CN IX, X no uvula deviation, symmetric rise of soft palate  CN XI 5/5 SCM and trapezius strength bilaterally  CN XII Midline tongue protrusion, symmetric L/R movements     Psychiatric:        Mood and Affect: Mood normal.     ED Results / Procedures / Treatments   Labs (all labs ordered are listed, but only abnormal results are displayed) Labs Reviewed - No data to display  EKG None  Radiology DG Wrist Complete Right  Result Date: 10/30/2021 CLINICAL DATA:  Fall and trauma to the right wrist. EXAM: RIGHT WRIST - COMPLETE 3+ VIEW COMPARISON:  Right hand radiograph dated 07/16/2017. FINDINGS: Faint bone density adjacent to the ulnar styloid on the AP view may represent degenerative changes or inferior edge of the pisiform. A fracture from the pisiform is less likely but not excluded correlation with clinical exam and point tenderness recommended CT may provide better evaluation if clinically indicated. No other acute fracture. The bones are osteopenic. Degenerative changes of the base of the thumb. The soft tissue swelling of the wrist. No  radiopaque foreign object or soft tissue gas. IMPRESSION: Degenerative changes versus less likely a fracture of the pisiform. Clinical correlation recommended Electronically Signed   By: Elgie Collard M.D.   On: 10/30/2021 17:17   DG Tibia/Fibula Right  Result Date: 10/30/2021 CLINICAL DATA:  Patient fell.  Pain. EXAM: RIGHT TIBIA AND FIBULA - 2 VIEW COMPARISON:  None Available. FINDINGS: There is no evidence of fracture or other focal bone lesions. Soft tissues are unremarkable. IMPRESSION: Negative. Electronically Signed   By: Kennith Center M.D.   On: 10/30/2021 17:11   DG Hip Unilat W or Wo Pelvis 2-3 Views Left  Result Date: 10/30/2021 CLINICAL  DATA:  Fall. EXAM: DG HIP (WITH OR WITHOUT PELVIS) 2-3V LEFT COMPARISON:  None Available. FINDINGS: Bones are diffusely demineralized. No evidence for an acute fracture. No worrisome lytic or sclerotic osseous abnormality. SI joints and symphysis pubis are unremarkable. AP and frog-leg lateral views of the left hip show no femoral neck fracture. IMPRESSION: 1. No acute bony findings. 2. Diffuse bony demineralization. Electronically Signed   By: Kennith Center M.D.   On: 10/30/2021 17:10    Procedures .Marland KitchenLaceration Repair  Date/Time: 10/30/2021 8:10 PM  Performed by: Peter Garter, PA Authorized by: Peter Garter, PA   Consent:    Consent obtained:  Verbal   Consent given by:  Patient   Risks, benefits, and alternatives were discussed: yes     Risks discussed:  Infection, need for additional repair, nerve damage, poor wound healing, poor cosmetic result, pain, retained foreign body, tendon damage and vascular damage   Alternatives discussed:  No treatment, observation, delayed treatment and referral Universal protocol:    Procedure explained and questions answered to patient or proxy's satisfaction: yes     Patient identity confirmed:  Verbally with patient Anesthesia:    Anesthesia method:  Local infiltration   Local anesthetic:   Lidocaine 2% WITH epi Laceration details:    Location:  Leg   Leg location:  R lower leg   Length (cm):  8.5 Pre-procedure details:    Preparation:  Imaging obtained to evaluate for foreign bodies Exploration:    Limited defect created (wound extended): no     Hemostasis achieved with:  Direct pressure   Imaging obtained: x-ray     Imaging outcome: foreign body not noted     Wound exploration: wound explored through full range of motion     Contaminated: no   Treatment:    Area cleansed with:  Saline and povidone-iodine   Amount of cleaning:  Extensive   Irrigation solution:  Sterile saline   Irrigation volume:  500   Irrigation method:  Syringe   Visualized foreign bodies/material removed: no     Debridement:  None   Undermining:  None   Scar revision: no     Layers/structures repaired:  Deep subcutaneous Deep subcutaneous:    Suture size:  4-0   Suture material:  Vicryl   Number of sutures:  14 Skin repair:    Repair method:  Tissue adhesive Approximation:    Approximation:  Close Repair type:    Repair type:  Simple Post-procedure details:    Dressing:  Sterile dressing     Medications Ordered in ED Medications  ibuprofen (ADVIL) tablet 600 mg (600 mg Oral Given 10/30/21 1743)  lidocaine-EPINEPHrine (XYLOCAINE W/EPI) 2 %-1:200000 (PF) injection 10 mL (10 mLs Infiltration Given 10/30/21 1918)    ED Course/ Medical Decision Making/ A&P                           Medical Decision Making Amount and/or Complexity of Data Reviewed Radiology: ordered.  Risk Prescription drug management.   This patient presents to the ED for concern of fall, this involves an extensive number of treatment options, and is a complaint that carries with it a high risk of complications and morbidity.  The differential diagnosis includes CVA, fracture, strain/sprain, dislocation, laceration, skin tear, foreign body retainment, solid organ damage.   Co morbidities that complicate the  patient evaluation  See HPI   Additional history obtained:  Additional history obtained from EMR  External records from outside source obtained and reviewed including hospital records   Lab Tests:  N/a   Imaging Studies ordered:  I ordered imaging studies including left hip with pelvis, right hip/fib, right wrist x-ray I independently visualized and interpreted imaging which showed no acute bony abnormalities. I agree with the radiologist interpretation   Cardiac Monitoring: / EKG:  The patient was maintained on a cardiac monitor.  I personally viewed and interpreted the cardiac monitored which showed an underlying rhythm of: Sinus rhythm   Consultations Obtained:  N/a   Problem List / ED Course / Critical interventions / Medication management  Fall I ordered medication including Motrin for pain.  Lidocaine with epinephrine for local infiltrative anesthetic.    Reevaluation of the patient after these medicines showed that the patient improved I have reviewed the patients home medicines and have made adjustments as needed   Social Determinants of Health:  Elderly with baseline gait instability.  Denies alcohol or illicit drug use.   Test / Admission - Considered:  Fall Vitals signs within normal range and stable throughout visit. Laboratory/imaging studies significant for: See above Patient experienced mechanical fall today.  No evidence of acute fracture.  Patient able to ambulate on emergency department without difficulty.  Wound repaired in manner indicated above.  Given exposed tibia, will provide prophylactic antibiotics in the form of Keflex.  Close follow-up with PCP recommended 3 to 5 days for reevaluation of symptoms.  Treatment plan discussed with patient she did not shortness and was agreeable to said plan. Worrisome signs and symptoms were discussed with the patient, and the patient acknowledged understanding to return to the ED if noticed. Patient was  stable upon discharge.          Final Clinical Impression(s) / ED Diagnoses Final diagnoses:  Fall, initial encounter  Laceration of right lower leg, initial encounter    Rx / DC Orders ED Discharge Orders          Ordered    cephALEXin (KEFLEX) 500 MG capsule  4 times daily        10/30/21 2013              Wilnette Kales, [place] 10/30/21 2013    Leanord Asal K, DO 10/31/21 0003

## 2021-10-30 NOTE — ED Triage Notes (Signed)
Patient BIB [facility] from Home.  Endorses Mechanical Fall after missing a Step coming out of her House. Fell onto Left Side but suffered Abrasions from Bricks to Right Forearm and Right Lower Leg.   No Head Injury. No LOC. No Anticoagulants.   NAD Noted during Triage. A&Ox4. GCS 15. BIB Wheelchair/Stretcher.

## 2021-11-10 DIAGNOSIS — Z23 Encounter for immunization: Secondary | ICD-10-CM | POA: Diagnosis not present

## 2021-11-10 DIAGNOSIS — S60811A Abrasion of right wrist, initial encounter: Secondary | ICD-10-CM | POA: Diagnosis not present

## 2021-11-10 DIAGNOSIS — S81811D Laceration without foreign body, right lower leg, subsequent encounter: Secondary | ICD-10-CM | POA: Diagnosis not present

## 2021-11-29 DIAGNOSIS — S81801A Unspecified open wound, right lower leg, initial encounter: Secondary | ICD-10-CM | POA: Diagnosis not present

## 2021-11-29 DIAGNOSIS — T148XXA Other injury of unspecified body region, initial encounter: Secondary | ICD-10-CM | POA: Diagnosis not present

## 2021-11-29 DIAGNOSIS — L089 Local infection of the skin and subcutaneous tissue, unspecified: Secondary | ICD-10-CM | POA: Diagnosis not present

## 2021-12-02 DIAGNOSIS — H353221 Exudative age-related macular degeneration, left eye, with active choroidal neovascularization: Secondary | ICD-10-CM | POA: Diagnosis not present

## 2021-12-08 DIAGNOSIS — H353221 Exudative age-related macular degeneration, left eye, with active choroidal neovascularization: Secondary | ICD-10-CM | POA: Diagnosis not present

## 2021-12-08 DIAGNOSIS — H353112 Nonexudative age-related macular degeneration, right eye, intermediate dry stage: Secondary | ICD-10-CM | POA: Diagnosis not present

## 2021-12-08 DIAGNOSIS — Z961 Presence of intraocular lens: Secondary | ICD-10-CM | POA: Diagnosis not present

## 2021-12-08 DIAGNOSIS — H35363 Drusen (degenerative) of macula, bilateral: Secondary | ICD-10-CM | POA: Diagnosis not present

## 2021-12-08 DIAGNOSIS — H35453 Secondary pigmentary degeneration, bilateral: Secondary | ICD-10-CM | POA: Diagnosis not present

## 2021-12-23 ENCOUNTER — Emergency Department (HOSPITAL_BASED_OUTPATIENT_CLINIC_OR_DEPARTMENT_OTHER)
Admission: EM | Admit: 2021-12-23 | Discharge: 2021-12-23 | Disposition: A | Discharge status: Home / Self Care | Payer: Medicare PPO | Attending: Emergency Medicine | Admitting: Emergency Medicine

## 2021-12-23 ENCOUNTER — Encounter (HOSPITAL_BASED_OUTPATIENT_CLINIC_OR_DEPARTMENT_OTHER): Payer: Self-pay

## 2021-12-23 ENCOUNTER — Other Ambulatory Visit: Payer: Self-pay

## 2021-12-23 DIAGNOSIS — I129 Hypertensive chronic kidney disease with stage 1 through stage 4 chronic kidney disease, or unspecified chronic kidney disease: Secondary | ICD-10-CM | POA: Diagnosis not present

## 2021-12-23 DIAGNOSIS — R197 Diarrhea, unspecified: Secondary | ICD-10-CM | POA: Diagnosis not present

## 2021-12-23 DIAGNOSIS — N179 Acute kidney failure, unspecified: Secondary | ICD-10-CM | POA: Diagnosis not present

## 2021-12-23 DIAGNOSIS — N189 Chronic kidney disease, unspecified: Secondary | ICD-10-CM | POA: Diagnosis not present

## 2021-12-23 DIAGNOSIS — D649 Anemia, unspecified: Secondary | ICD-10-CM | POA: Diagnosis not present

## 2021-12-23 DIAGNOSIS — Z79899 Other long term (current) drug therapy: Secondary | ICD-10-CM | POA: Diagnosis not present

## 2021-12-23 LAB — URINALYSIS, ROUTINE W REFLEX MICROSCOPIC
Bilirubin Urine: NEGATIVE
Glucose, UA: NEGATIVE mg/dL
Hgb urine dipstick: NEGATIVE
Ketones, ur: NEGATIVE mg/dL
Nitrite: NEGATIVE
Protein, ur: 30 mg/dL — AB
Specific Gravity, Urine: 1.026 (ref 1.005–1.030)
pH: 5 (ref 5.0–8.0)

## 2021-12-23 LAB — CBC
HCT: 30.5 % — ABNORMAL LOW (ref 36.0–46.0)
Hemoglobin: 9.2 g/dL — ABNORMAL LOW (ref 12.0–15.0)
MCH: 25 pg — ABNORMAL LOW (ref 26.0–34.0)
MCHC: 30.2 g/dL (ref 30.0–36.0)
MCV: 82.9 fL (ref 80.0–100.0)
Platelets: 345 10*3/uL (ref 150–400)
RBC: 3.68 MIL/uL — ABNORMAL LOW (ref 3.87–5.11)
RDW: 18.3 % — ABNORMAL HIGH (ref 11.5–15.5)
WBC: 12.8 10*3/uL — ABNORMAL HIGH (ref 4.0–10.5)
nRBC: 0 % (ref 0.0–0.2)

## 2021-12-23 LAB — COMPREHENSIVE METABOLIC PANEL
ALT: 10 U/L (ref 0–44)
AST: 14 U/L — ABNORMAL LOW (ref 15–41)
Albumin: 3.5 g/dL (ref 3.5–5.0)
Alkaline Phosphatase: 61 U/L (ref 38–126)
Anion gap: 13 (ref 5–15)
BUN: 29 mg/dL — ABNORMAL HIGH (ref 8–23)
CO2: 21 mmol/L — ABNORMAL LOW (ref 22–32)
Calcium: 9.1 mg/dL (ref 8.9–10.3)
Chloride: 106 mmol/L (ref 98–111)
Creatinine, Ser: 1.7 mg/dL — ABNORMAL HIGH (ref 0.44–1.00)
GFR, Estimated: 28 mL/min — ABNORMAL LOW (ref >60)
Glucose, Bld: 94 mg/dL (ref 70–99)
Potassium: 4.8 mmol/L (ref 3.5–5.1)
Sodium: 140 mmol/L (ref 135–145)
Total Bilirubin: 0.5 mg/dL (ref 0.3–1.2)
Total Protein: 6.6 g/dL (ref 6.5–8.1)

## 2021-12-23 LAB — OCCULT BLOOD X 1 CARD TO LAB, STOOL: Fecal Occult Bld: NEGATIVE

## 2021-12-23 LAB — LIPASE, BLOOD: Lipase: 75 U/L — ABNORMAL HIGH (ref 11–51)

## 2021-12-23 MED ORDER — LACTATED RINGERS IV BOLUS
1000.0000 mL | Freq: Once | INTRAVENOUS | Status: AC
  Administered 2021-12-23: 1000 mL via INTRAVENOUS

## 2021-12-23 NOTE — Discharge Instructions (Signed)
You were seen in the emergency department for your diarrhea.  You did appear mildly dehydrated with a slight increase of your kidney function and we gave you some fluids through the IV.  You were also found to be slightly anemic with your hemoglobin of 9 but you had no signs of any blood in your stools.  Is unclear what is the cause of your diarrhea but may be due to your diet and your recent stool softeners.  You can take a fiber supplement to help bulk your stools and you should start to return to a normal diet.  You can follow-up with your primary doctor in the next few days to have your symptoms and labs rechecked.  You should return to the emergency department for worsening abdominal pain, repetitive vomiting, if you pass out or if you have any other new or concerning symptoms.

## 2021-12-23 NOTE — ED Provider Notes (Signed)
[facility] EMERGENCY DEPT Provider Note   CSN: [id number] Arrival date & time: 12/23/21  1235     History  Chief Complaint  Patient presents with   Diarrhea    Sharon Bailey is a 88 y.o. female.  Patient is an 88 year old female with a past medical history of hypertension, diverticulosis and CKD that presented to the emergency department with diarrhea.  The patient states that she was on antibiotics for cellulitis few weeks ago and took a nausea medication with the antibiotics.  She states that she finished this about a week and a half ago and after finishing the treatment she was constipated.  She states that she took MiraLAX and Dulcolax and was able to have a bowel movement but over the last several days has continued to have diarrhea.  She states that she has 1-2 loose stools per day.  She states that is not watery and denies any black or bloody stools.  She denies any associated abdominal pain, fevers or chills.  She denies any dysuria or hematuria.  She states that she was feeling weak and dehydrated today which prompted her to come to the emergency department.  The history is provided by the patient and a relative.  Diarrhea      Home Medications Prior to Admission medications   Medication Sig Start Date End Date Taking? Authorizing Provider  allopurinol (ZYLOPRIM) 300 MG tablet Take 300 mg by mouth daily.    [provider]  amLODipine (NORVASC) 5 MG tablet Take 1 tablet (5 mg total) by mouth daily. 10/20/21   Jodelle Red, MD  budesonide (ENTOCORT EC) 3 MG 24 hr capsule TAKE 1 CAPSULE EVERY DAY AS DIRECTED 04/14/21   Hilarie Fredrickson, MD  carvedilol (COREG) 25 MG tablet Take by mouth. 03/09/18   [provider]  cephALEXin (KEFLEX) 500 MG capsule Take 1 capsule (500 mg total) by mouth 4 (four) times daily. 10/30/21   Peter Garter, PA  HYDROcodone-acetaminophen (NORCO/VICODIN) 5-325 MG tablet Take 2 tablets by mouth every 4 (four)  hours as needed. 10/30/21   Sherian Maroon A, PA  ibuprofen (ADVIL) 200 MG tablet Take by mouth.    [provider]  levothyroxine (SYNTHROID) 88 MCG tablet Take 88 mcg by mouth daily before breakfast.    [provider]  pregabalin (LYRICA) 50 MG capsule 1 tablet at night for one week, then 1 tablet in the morning and 1 tablet at night. 10/27/21   Juanda Chance, NP  simvastatin (ZOCOR) 40 MG tablet Take 40 mg by mouth daily.    [provider]  traMADol (ULTRAM) 50 MG tablet Take 1 tablet (50 mg total) by mouth every 8 (eight) hours as needed for moderate pain or severe pain. 10/27/21   Juanda Chance, NP  valsartan (DIOVAN) 160 MG tablet Take 1 tablet (160 mg total) by mouth daily. 10/23/21   Jodelle Red, MD  vitamin B-12 1000 MCG tablet Take 1 tablet (1,000 mcg total) by mouth daily. 06/14/19   Joseph Art, DO      Allergies    Sulfamethoxazole-trimethoprim    Review of Systems   Review of Systems  Gastrointestinal:  Positive for diarrhea.    Physical Exam Updated Vital Signs BP (!) 185/70   Pulse 85   Temp 97.7 F (36.5 C) (Oral)   Resp 20   Ht 5\' 3"  (1.6 m)   Wt 70.5 kg   SpO2 97%   BMI 27.53 kg/m  Physical  Exam Vitals and nursing note reviewed. Exam conducted with a chaperone present Etta Quill RN).  Constitutional:      General: She is not in acute distress.    Appearance: Normal appearance.  HENT:     Head: Normocephalic and atraumatic.     Nose: Nose normal.     Mouth/Throat:     Mouth: Mucous membranes are moist.     Pharynx: Oropharynx is clear.  Eyes:     Extraocular Movements: Extraocular movements intact.     Conjunctiva/sclera: Conjunctivae normal.  Cardiovascular:     Rate and Rhythm: Normal rate and regular rhythm.     Pulses: Normal pulses.     Heart sounds: Normal heart sounds.  Pulmonary:     Effort: Pulmonary effort is normal.     Breath sounds: Normal breath sounds.  Abdominal:     General:  Abdomen is flat.     Palpations: Abdomen is soft.     Tenderness: There is no abdominal tenderness.  Genitourinary:    Rectum: Guaiac result negative.     Comments: Small anal fissure, no visible hemorrhoids Musculoskeletal:        General: Normal range of motion.     Cervical back: Normal range of motion and neck supple.     Right lower leg: No edema.     Left lower leg: No edema.  Skin:    General: Skin is warm and dry.  Neurological:     General: No focal deficit present.     Mental Status: She is alert and oriented to person, place, and time.  Psychiatric:        Mood and Affect: Mood normal.        Behavior: Behavior normal.     ED Results / Procedures / Treatments   Labs (all labs ordered are listed, but only abnormal results are displayed) Labs Reviewed  LIPASE, BLOOD - Abnormal; Notable for the following components:      Result Value   Lipase 75 (*)    All other components within normal limits  COMPREHENSIVE METABOLIC PANEL - Abnormal; Notable for the following components:   CO2 21 (*)    BUN 29 (*)    Creatinine, Ser 1.70 (*)    AST 14 (*)    GFR, Estimated 28 (*)    All other components within normal limits  CBC - Abnormal; Notable for the following components:   WBC 12.8 (*)    RBC 3.68 (*)    Hemoglobin 9.2 (*)    HCT 30.5 (*)    MCH 25.0 (*)    RDW 18.3 (*)    All other components within normal limits  URINALYSIS, ROUTINE W REFLEX MICROSCOPIC - Abnormal; Notable for the following components:   APPearance HAZY (*)    Protein, ur 30 (*)    Leukocytes,Ua MODERATE (*)    Bacteria, UA RARE (*)    All other components within normal limits  OCCULT BLOOD X 1 CARD TO LAB, STOOL    EKG None  Radiology No results found.  Procedures Procedures    Medications Ordered in ED Medications  lactated ringers bolus 1,000 mL (0 mLs Intravenous Stopped 12/23/21 1630)    ED Course/ Medical Decision Making/ A&P                           Medical Decision  Making This patient presents to the ED with chief complaint(s) of diarrhea with pertinent past  medical history of hypertension, diverticulosis, CKD which further complicates the presenting complaint. The complaint involves an extensive differential diagnosis and also carries with it a high risk of complications and morbidity.    The differential diagnosis includes patient did have recent antibiotics however C. difficile is unlikely due to known watery stools and low frequency of diarrhea, considering dehydration, electrolyte abnormality, viral syndrome, medication side effect  Additional history obtained: Additional history obtained from family Records reviewed Primary Care Documents  ED Course and Reassessment: Patient was initially evaluated in triage and had labs performed that did show an AKI with a creatinine of 1.7 from baseline around 1.4.  She had a mild elevation of her lipase but not significant making pancreatitis unlikely and a mild leukocytosis.  She was anemic to 9.2 from her baseline of around 11 and Hemoccult will be performed.  She has no abdominal tenderness making an intra-abdominal infection unlikely.  Independent labs interpretation:  The following labs were independently interpreted: Mild AKI, mild anemia  Independent visualization of imaging: N/A  Consultation: - Consulted or discussed management/test interpretation w/ external professional: N/A  Consideration for admission or further workup: Patient has no emergent conditions requiring admission or further work-up at this time and is stable for discharge home with primary care follow-up  Social Determinants of health: N/A    Amount and/or Complexity of Data Reviewed Labs: ordered.           Final Clinical Impression(s) / ED Diagnoses Final diagnoses:  Diarrhea, unspecified type  AKI (acute kidney injury) ([place])  Anemia, unspecified type    Rx / DC Orders ED Discharge Orders     None          Kemper Durie, DO 12/23/21 1806

## 2021-12-23 NOTE — ED Notes (Signed)
Dc instructions reviewed with patient. Patient voiced understanding. Dc with belongings.  °

## 2021-12-23 NOTE — ED Notes (Signed)
ED Provider at bedside.performed rectal exam

## 2021-12-23 NOTE — ED Triage Notes (Signed)
Patient here POV from Home.  Endorses Diarrhea for approximately 1 Week. States she has recently been taking an Antibiotic for an Infection resulting from a Wound sustained during a Fall in October. Originally was constipated but with PTC Medication she began to have Diarrhea.   Patient believes she is dehydrated. No N/V.   NAD Noted during Triage. A&Ox4. GCS 15. Ambulatory.

## 2022-01-20 ENCOUNTER — Ambulatory Visit: Payer: Medicare PPO | Admitting: Podiatry

## 2022-01-20 ENCOUNTER — Encounter: Payer: Self-pay | Admitting: Podiatry

## 2022-01-20 VITALS — BP 127/54

## 2022-01-20 DIAGNOSIS — M79675 Pain in left toe(s): Secondary | ICD-10-CM | POA: Diagnosis not present

## 2022-01-20 DIAGNOSIS — B351 Tinea unguium: Secondary | ICD-10-CM

## 2022-01-20 DIAGNOSIS — I739 Peripheral vascular disease, unspecified: Secondary | ICD-10-CM | POA: Diagnosis not present

## 2022-01-20 DIAGNOSIS — M79674 Pain in right toe(s): Secondary | ICD-10-CM | POA: Diagnosis not present

## 2022-01-20 NOTE — Progress Notes (Signed)
  Subjective:  Patient ID: Sharon Bailey, female    DOB: 04-01-1930,  MRN: [id number]  Sharon Bailey presents to clinic today for painful thick toenails that are difficult to trim. Pain interferes with ambulation. Aggravating factors include wearing enclosed shoe gear. Pain is relieved with periodic professional debridement.  Chief Complaint  Patient presents with   Nail Problem    [facility] PCP-Dean Mithchell PCP VST-Summer 2023   New problem(s): None.   PCP is Alroy Dust, L.Marlou Sa, MD.  Allergies  Allergen Reactions   Ciprofloxacin Hives   Sulfamethoxazole-Trimethoprim Diarrhea and Nausea Only    Other reaction(s): diarrhea   Review of Systems: Negative except as noted in the HPI.  Objective: No changes noted in today's physical examination. Vitals:   01/20/22 1352 01/20/22 1409  BP: (!) 162/72 (!) 127/54   Sharon Bailey is a pleasant 89 y.o. female WD, WN in NAD. AAO x 3.  Vascular Examination: Vascular status intact b/l with palpable DP pulses b/l. PT pulses nonpalpable b/l. CFT immediate b/l. No edema. No pain with calf compression b/l. Skin temperature gradient WNL b/l. Pedal hair absent.  Neurological Examination: Sensation grossly intact b/l with 10 gram monofilament. Vibratory sensation intact b/l.   Dermatological Examination: Pedal skin with normal turgor, texture and tone b/l. Toenails 1-5 b/l thick, discolored, elongated with subungual debris and pain on dorsal palpation. No hyperkeratotic lesions noted b/l.   Musculoskeletal Examination: Muscle strength 5/5 to b/l LE. Hammertoe deformity noted 2-5 b/l. Patient ambulates independent of any assistive aids.  Assessment/Plan: 1. Pain due to onychomycosis of toenails of both feet   2. PAD (peripheral artery disease) (HCC)     -Examined patient. -Continue supportive shoe gear daily. -Toenails 1-5 b/l were debrided in length and girth with sterile nail nippers and dremel without iatrogenic bleeding.  -Patient/POA to  call should there be question/concern in the interim.   Return in about 3 months (around 04/21/2022).  Marzetta Board, DPM

## 2022-01-25 ENCOUNTER — Encounter (HOSPITAL_BASED_OUTPATIENT_CLINIC_OR_DEPARTMENT_OTHER): Payer: Self-pay | Admitting: Cardiology

## 2022-02-03 DIAGNOSIS — H353221 Exudative age-related macular degeneration, left eye, with active choroidal neovascularization: Secondary | ICD-10-CM | POA: Diagnosis not present

## 2022-02-06 DIAGNOSIS — E7800 Pure hypercholesterolemia, unspecified: Secondary | ICD-10-CM | POA: Diagnosis not present

## 2022-02-06 DIAGNOSIS — Z0000 Encounter for general adult medical examination without abnormal findings: Secondary | ICD-10-CM | POA: Diagnosis not present

## 2022-02-06 DIAGNOSIS — H353223 Exudative age-related macular degeneration, left eye, with inactive scar: Secondary | ICD-10-CM | POA: Diagnosis not present

## 2022-02-06 DIAGNOSIS — M109 Gout, unspecified: Secondary | ICD-10-CM | POA: Diagnosis not present

## 2022-02-06 DIAGNOSIS — I679 Cerebrovascular disease, unspecified: Secondary | ICD-10-CM | POA: Diagnosis not present

## 2022-02-06 DIAGNOSIS — H26492 Other secondary cataract, left eye: Secondary | ICD-10-CM | POA: Diagnosis not present

## 2022-02-06 DIAGNOSIS — E039 Hypothyroidism, unspecified: Secondary | ICD-10-CM | POA: Diagnosis not present

## 2022-02-06 DIAGNOSIS — M545 Low back pain, unspecified: Secondary | ICD-10-CM | POA: Diagnosis not present

## 2022-02-06 DIAGNOSIS — Z961 Presence of intraocular lens: Secondary | ICD-10-CM | POA: Diagnosis not present

## 2022-02-06 DIAGNOSIS — N183 Chronic kidney disease, stage 3 unspecified: Secondary | ICD-10-CM | POA: Diagnosis not present

## 2022-02-06 DIAGNOSIS — I10 Essential (primary) hypertension: Secondary | ICD-10-CM | POA: Diagnosis not present

## 2022-02-06 DIAGNOSIS — L299 Pruritus, unspecified: Secondary | ICD-10-CM | POA: Diagnosis not present

## 2022-02-12 DIAGNOSIS — D649 Anemia, unspecified: Secondary | ICD-10-CM | POA: Diagnosis not present

## 2022-02-16 NOTE — Progress Notes (Unsigned)
     02/16/2022 Sharon Bailey [id number] 01/05/1931   Chief Complaint:  History of Present Illness: Sharon Bailey is an 89 year old female with a past medical history of hypertension, hyperlipidemia, hypothyroidism, diverticulosis, collagenous colitis. She is followed by Dr. Henrene Pastor. She presents today for her annual office visit. She is on Budesonide Imodium PRN  Colonoscopy 12/20/2014 Severe sigmoid diverticulosis - FINDINGS CONSISTENT WITH COLLAGENOUS COLITIS. - NO DYSPLASIA OR MALIGNANCY IDENTIFIED.    Current Medications, Allergies, Past Medical History, Past Surgical History, Family History and Social History were reviewed in [employer] record.   Review of Systems:   Constitutional: Negative for fever, sweats, chills or weight loss.  Respiratory: Negative for shortness of breath.   Cardiovascular: Negative for chest pain, palpitations and leg swelling.  Gastrointestinal: See HPI.  Musculoskeletal: Negative for back pain or muscle aches.  Neurological: Negative for dizziness, headaches or paresthesias.    Physical Exam: There were no vitals taken for this visit. General: in no acute distress. Head: Normocephalic and atraumatic. Eyes: No scleral icterus. Conjunctiva pink . Ears: Normal auditory acuity. Mouth: Dentition intact. No ulcers or lesions.  Lungs: Clear throughout to auscultation. Heart: Regular rate and rhythm, no murmur. Abdomen: Soft, nontender and nondistended. No masses or hepatomegaly. Normal bowel sounds x 4 quadrants.  Rectal: Deferred.  Musculoskeletal: Symmetrical with no gross deformities. Extremities: No edema. Neurological: Alert oriented x 4. No focal deficits.  Psychological: Alert and cooperative. Normal mood and affect  Assessment and Recommendations:  89 year old female with collagenous colitis

## 2022-02-17 ENCOUNTER — Ambulatory Visit: Payer: Medicare PPO | Admitting: Nurse Practitioner

## 2022-02-17 ENCOUNTER — Encounter: Payer: Self-pay | Admitting: Nurse Practitioner

## 2022-02-17 VITALS — BP 160/62 | HR 94 | Ht 63.0 in | Wt 152.2 lb

## 2022-02-17 DIAGNOSIS — D649 Anemia, unspecified: Secondary | ICD-10-CM

## 2022-02-17 DIAGNOSIS — E538 Deficiency of other specified B group vitamins: Secondary | ICD-10-CM | POA: Diagnosis not present

## 2022-02-17 DIAGNOSIS — K52831 Collagenous colitis: Secondary | ICD-10-CM

## 2022-02-17 DIAGNOSIS — K52832 Lymphocytic colitis: Secondary | ICD-10-CM | POA: Insufficient documentation

## 2022-02-17 MED ORDER — BUDESONIDE 3 MG PO CPEP: ORAL_CAPSULE | ORAL | 1 refills | Status: DC

## 2022-02-17 NOTE — Patient Instructions (Addendum)
We have sent the following medications to your pharmacy for you to pick up at your convenience: Budesonide   Contact PCP today for CBC follow up & hematology follow up.  Go to the emergency department if you develop any chest pain, shortness of breath or dizziness.  Thank you for trusting me with your gastrointestinal care!   Carl Best, CRNP

## 2022-02-18 ENCOUNTER — Telehealth: Payer: Self-pay | Admitting: Oncology

## 2022-02-18 NOTE — Progress Notes (Signed)
Noted  

## 2022-02-18 NOTE — Telephone Encounter (Signed)
Scheduled appt per 2/14 referral. Pt is aware of appt date and time. Pt is aware to arrive 15 mins prior to appt time and to bring and updated insurance card. Pt is aware of appt location.

## 2022-02-20 ENCOUNTER — Other Ambulatory Visit: Payer: Self-pay

## 2022-02-20 DIAGNOSIS — D649 Anemia, unspecified: Secondary | ICD-10-CM | POA: Diagnosis not present

## 2022-02-20 MED ORDER — BUDESONIDE 3 MG PO CPEP: ORAL_CAPSULE | ORAL | 1 refills | Status: DC

## 2022-02-21 ENCOUNTER — Inpatient Hospital Stay: Payer: Medicare PPO | Attending: Oncology | Admitting: Oncology

## 2022-02-21 ENCOUNTER — Encounter: Payer: Self-pay | Admitting: Oncology

## 2022-02-21 VITALS — BP 150/54 | HR 102 | Temp 97.5°F | Resp 18 | Wt 153.6 lb

## 2022-02-21 DIAGNOSIS — Z7982 Long term (current) use of aspirin: Secondary | ICD-10-CM | POA: Diagnosis not present

## 2022-02-21 DIAGNOSIS — D631 Anemia in chronic kidney disease: Secondary | ICD-10-CM | POA: Insufficient documentation

## 2022-02-21 DIAGNOSIS — I129 Hypertensive chronic kidney disease with stage 1 through stage 4 chronic kidney disease, or unspecified chronic kidney disease: Secondary | ICD-10-CM | POA: Diagnosis not present

## 2022-02-21 DIAGNOSIS — D649 Anemia, unspecified: Secondary | ICD-10-CM | POA: Insufficient documentation

## 2022-02-21 DIAGNOSIS — Z7902 Long term (current) use of antithrombotics/antiplatelets: Secondary | ICD-10-CM | POA: Insufficient documentation

## 2022-02-21 DIAGNOSIS — Z79899 Other long term (current) drug therapy: Secondary | ICD-10-CM | POA: Insufficient documentation

## 2022-02-21 DIAGNOSIS — N183 Chronic kidney disease, stage 3 unspecified: Secondary | ICD-10-CM

## 2022-02-21 DIAGNOSIS — E039 Hypothyroidism, unspecified: Secondary | ICD-10-CM | POA: Insufficient documentation

## 2022-02-21 DIAGNOSIS — E785 Hyperlipidemia, unspecified: Secondary | ICD-10-CM

## 2022-02-21 DIAGNOSIS — Z7952 Long term (current) use of systemic steroids: Secondary | ICD-10-CM

## 2022-02-21 DIAGNOSIS — R54 Age-related physical debility: Secondary | ICD-10-CM

## 2022-02-21 DIAGNOSIS — N1832 Chronic kidney disease, stage 3b: Secondary | ICD-10-CM

## 2022-02-21 DIAGNOSIS — K52831 Collagenous colitis: Secondary | ICD-10-CM | POA: Diagnosis not present

## 2022-02-21 DIAGNOSIS — D539 Nutritional anemia, unspecified: Secondary | ICD-10-CM | POA: Insufficient documentation

## 2022-02-21 DIAGNOSIS — K52832 Lymphocytic colitis: Secondary | ICD-10-CM

## 2022-02-21 NOTE — Progress Notes (Signed)
[facility] Initial Visit:  Patient Care Team: Alroy Dust, L.Marlou Sa, MD as PCP - General (Family Medicine) Buford Dresser, MD as PCP - Cardiology (Cardiology)  CHIEF COMPLAINTS/PURPOSE OF CONSULTATION:   HISTORY OF PRESENTING ILLNESS: Sharon Bailey 89 y.o. female is here because of anemia  Medical history notable for collagenous colitis treated with budesinolide, diverticulitis, GERD, gout, hyperlipidemia, hypertension, thyroid disease, cardiac arrhythmia, chronic kidney disease  December 20 2014:  Colonoscopy.  Severe diverticulosis in sigmoid colon  February 12, 2022: WBC 9.4 hemoglobin 7.3 MCV 79 platelet count 221; 82 segs 11 lymphs 4 monos 3 eos Ferritin 33   Chemistries notable for creatinine 1.18  February 21 2022:  [facility] Consult  Colitis overall under good control with budesinolide.   Has been taking oral iron for the past week.  No history of IV iron nor required PRBC's in the past.  Has tolerated oral iron.  Has a regular diet.  No history of postpartum hemorrhage requiring transfusion.   No history of hemorrhage postoperatively requiring transfusion.  No hematochezia, melena, hemoptysis, hematuria.   No history of intra-articular or soft tissue bleeding.  Uses ibuprofen about 2 to 3 times a week for pain.  Is not taking oral anticoagulants but does take ASA daily as an antiplatelet drugs.  No history of abnormal bleeding in family members.  Patient has symptoms of fatigue, pallor,  DOE, intolerance to cold, decreased performance status.  Patient has pica to ice but not starch/dirt.  Has never undergone an EGD.    Review of Systems - Oncology  MEDICAL HISTORY: Past Medical History:  Diagnosis Date   Abnormal EKG    Collagenous colitis    Collagenous colitis    Diverticulitis    Diverticulosis    Fluttering heart    GERD (gastroesophageal reflux disease)    Gout    Gout attack 09/28/2012   Hammer toe of left foot 09/28/2012    Hyperlipidemia    Hypertension    Hypertensive emergency    Hypothyroidism    Irregular heart beat    Pain in joint, ankle and foot 09/28/2012   Status post foot surgery 10/11/2012   Thyroid disease     SURGICAL HISTORY: Past Surgical History:  Procedure Laterality Date   ABDOMINAL HYSTERECTOMY     ADENOIDECTOMY     CHOLECYSTECTOMY     Excision Ganglion Toe Left 09/29/2012   Lt #2 @ [facility]   Hammer toe repair Left 09/29/2012   Lt #2 @ [facility]   TONSILLECTOMY      SOCIAL HISTORY: Social History   Socioeconomic History   Marital status: Married    Spouse name: Not on file   Number of children: 3   Years of education: 12   Highest education level: 12th grade  Occupational History   Not on file  Tobacco Use   Smoking status: Never   Smokeless tobacco: Never  Vaping Use   Vaping Use: Never used  Substance and Sexual Activity   Alcohol use: No    Alcohol/week: 0.0 standard drinks of alcohol   Drug use: No   Sexual activity: Not Currently  Other Topics Concern   Not on file  Social History Narrative   Not on file   Social Determinants of Health   Financial Resource Strain: Not on file  Food Insecurity: No Food Insecurity (02/21/2022)   Hunger Vital Sign    Worried About Running Out of Food in the Last Year: Never true  Ran Out of Food in the Last Year: Never true  Transportation Needs: No Transportation Needs (02/21/2022)   PRAPARE - Hydrologist (Medical): No    Lack of Transportation (Non-Medical): No  Physical Activity: Not on file  Stress: Not on file  Social Connections: Not on file  Intimate Partner Violence: Not At Risk (02/21/2022)   Humiliation, Afraid, Rape, and Kick questionnaire    Fear of Current or Ex-Partner: No    Emotionally Abused: No    Physically Abused: No    Sexually Abused: No    FAMILY HISTORY Family History  Problem Relation Age of Onset   Breast cancer Mother    Memory loss Father    Heart disease Sister     Rheumatic fever Sister    Lung disease Brother    Colon cancer Neg Hx     ALLERGIES:  is allergic to ciprofloxacin and sulfamethoxazole-trimethoprim.  MEDICATIONS:  Current Outpatient Medications  Medication Sig Dispense Refill   allopurinol (ZYLOPRIM) 300 MG tablet Take 300 mg by mouth daily.     amLODipine (NORVASC) 5 MG tablet Take 1 tablet (5 mg total) by mouth daily. 90 tablet 3   budesonide (ENTOCORT EC) 3 MG 24 hr capsule TAKE 1 CAPSULE EVERY DAY AS DIRECTED 90 capsule 1   levothyroxine (SYNTHROID) 88 MCG tablet Take 88 mcg by mouth daily before breakfast.     pregabalin (LYRICA) 50 MG capsule 1 tablet at night for one week, then 1 tablet in the morning and 1 tablet at night. (Patient not taking: Reported on 02/17/2022) 60 capsule 1   simvastatin (ZOCOR) 40 MG tablet Take 40 mg by mouth daily.     valsartan (DIOVAN) 160 MG tablet Take 1 tablet (160 mg total) by mouth daily. 90 tablet 3   vitamin B-12 1000 MCG tablet Take 1 tablet (1,000 mcg total) by mouth daily.     No current facility-administered medications for this visit.    PHYSICAL EXAMINATION:  ECOG PERFORMANCE STATUS: 1 - Symptomatic but completely ambulatory   Vitals:   02/21/22 1124  BP: (!) 150/54  Pulse: (!) 102  Resp: 18  Temp: (!) 97.5 F (36.4 C)  SpO2: 100%    Filed Weights   02/21/22 1124  Weight: 153 lb 9.6 oz (69.7 kg)     Physical Exam Vitals and nursing note reviewed.  Constitutional:      Appearance: Normal appearance. She is ill-appearing. She is not toxic-appearing or diaphoretic.     Comments: Here with daughter  HENT:     Head: Normocephalic and atraumatic.     Right Ear: External ear normal.     Left Ear: External ear normal.     Nose: Nose normal. No congestion or rhinorrhea.  Eyes:     General: No scleral icterus.    Extraocular Movements: Extraocular movements intact.     Conjunctiva/sclera: Conjunctivae normal.     Pupils: Pupils are equal, round, and reactive to light.   Cardiovascular:     Rate and Rhythm: Normal rate.     Heart sounds: No murmur heard.    No friction rub. No gallop.  Abdominal:     General: Bowel sounds are normal.     Palpations: Abdomen is soft.  Musculoskeletal:        General: No swelling, tenderness or deformity.     Cervical back: Normal range of motion and neck supple. No rigidity or tenderness.  Lymphadenopathy:     Head:  Right side of head: No submental, submandibular, tonsillar, preauricular, posterior auricular or occipital adenopathy.     Left side of head: No submental, submandibular, tonsillar, preauricular, posterior auricular or occipital adenopathy.     Cervical: No cervical adenopathy.     Right cervical: No superficial, deep or posterior cervical adenopathy.    Left cervical: No superficial, deep or posterior cervical adenopathy.     Upper Body:     Right upper body: No supraclavicular, axillary, pectoral or epitrochlear adenopathy.     Left upper body: No supraclavicular, axillary, pectoral or epitrochlear adenopathy.  Skin:    General: Skin is warm.     Coloration: Skin is not jaundiced.     Findings: Bruising present.     Comments: Senile purpura on extensor surfaces Also has some bruising on left neck  Neurological:     General: No focal deficit present.     Mental Status: She is alert and oriented to person, place, and time.     Cranial Nerves: Cranial nerve deficit present.     Gait: Gait abnormal.     Comments: Has hearing aids.  Ambulates with a cane  Psychiatric:        Mood and Affect: Mood normal.        Behavior: Behavior normal.        Thought Content: Thought content normal.        Judgment: Judgment normal.    LABORATORY DATA: I have personally reviewed the data as listed:  No visits with results within 1 Month(s) from this visit.  Latest known visit with results is:  Admission on 12/23/2021, Discharged on 12/23/2021  Component Date Value Ref Range Status   Lipase 12/23/2021 75  (H)  11 - 51 U/L Final   Performed at [facility], [place], [place] [address]   Sodium 12/23/2021 140  135 - 145 mmol/L Final   Potassium 12/23/2021 4.8  3.5 - 5.1 mmol/L Final   Chloride 12/23/2021 106  98 - 111 mmol/L Final   CO2 12/23/2021 21 (L)  22 - 32 mmol/L Final   Glucose, Bld 12/23/2021 94  70 - 99 mg/dL Final   Glucose reference range applies only to samples taken after fasting for at least 8 hours.   BUN 12/23/2021 29 (H)  8 - 23 mg/dL Final   Creatinine, Ser 12/23/2021 1.70 (H)  0.44 - 1.00 mg/dL Final   Calcium 12/23/2021 9.1  8.9 - 10.3 mg/dL Final   Total Protein 12/23/2021 6.6  6.5 - 8.1 g/dL Final   Albumin 12/23/2021 3.5  3.5 - 5.0 g/dL Final   AST 12/23/2021 14 (L)  15 - 41 U/L Final   ALT 12/23/2021 10  0 - 44 U/L Final   Alkaline Phosphatase 12/23/2021 61  38 - 126 U/L Final   Total Bilirubin 12/23/2021 0.5  0.3 - 1.2 mg/dL Final   GFR, Estimated 12/23/2021 28 (L)  >60 mL/min Final   Comment: (NOTE) Calculated using the CKD-EPI Creatinine Equation (2021)    Anion gap 12/23/2021 13  5 - 15 Final   Performed at [facility], [place], [place], [place] [address]   WBC 12/23/2021 12.8 (H)  4.0 - 10.5 K/uL Final   RBC 12/23/2021 3.68 (L)  3.87 - 5.11 MIL/uL Final   Hemoglobin 12/23/2021 9.2 (L)  12.0 - 15.0 g/dL Final   HCT 12/23/2021 30.5 (L)  36.0 - 46.0 % Final   MCV 12/23/2021 82.9  80.0 -  100.0 fL Final   MCH 12/23/2021 25.0 (L)  26.0 - 34.0 pg Final   MCHC 12/23/2021 30.2  30.0 - 36.0 g/dL Final   RDW 12/23/2021 18.3 (H)  11.5 - 15.5 % Final   Platelets 12/23/2021 345  150 - 400 K/uL Final   nRBC 12/23/2021 0.0  0.0 - 0.2 % Final   Performed at [facility], [address], [place], [place] [address]   Color, Urine 12/23/2021 YELLOW  YELLOW Final   APPearance 12/23/2021 HAZY (A)  CLEAR Final   Specific Gravity, Urine 12/23/2021 1.026  1.005 - 1.030 Final   pH 12/23/2021 5.0   5.0 - 8.0 Final   Glucose, UA 12/23/2021 NEGATIVE  NEGATIVE mg/dL Final   Hgb urine dipstick 12/23/2021 NEGATIVE  NEGATIVE Final   Bilirubin Urine 12/23/2021 NEGATIVE  NEGATIVE Final   Ketones, ur 12/23/2021 NEGATIVE  NEGATIVE mg/dL Final   Protein, ur 12/23/2021 30 (A)  NEGATIVE mg/dL Final   Nitrite 12/23/2021 NEGATIVE  NEGATIVE Final   Leukocytes,Ua 12/23/2021 MODERATE (A)  NEGATIVE Final   RBC / HPF 12/23/2021 0-5  0 - 5 RBC/hpf Final   WBC, UA 12/23/2021 0-5  0 - 5 WBC/hpf Final   Bacteria, UA 12/23/2021 RARE (A)  NONE SEEN Final   Squamous Epithelial / HPF 12/23/2021 11-20  0 - 5 Final   Mucus 12/23/2021 PRESENT   Final   Hyaline Casts, UA 12/23/2021 PRESENT   Final   Performed at [facility], [address] [place], [place] [address]   Fecal Occult Bld 12/23/2021 NEGATIVE  NEGATIVE Final   Performed at [facility], [address] [place], [place] [address]    RADIOGRAPHIC STUDIES: I have personally reviewed the radiological images as listed and agree with the findings in the report  No results found.  ASSESSMENT/PLAN  89 y.o. female is here because of anemia.  Medical history notable for collagenous colitis treated with budesinolide, diverticulitis, GERD, gout, hyperlipidemia, hypertension, thyroid disease, cardiac arrhythmia, chronic kidney disease  Anemia:  Etiology likely multifactorial with possible culprits being 1) Iron deficiency from occult GI blood likely due to colitis 2) CKD 3) Chronic inflammation Will obtain CBC with diff, CMP, Ferritin, B12, folate, retic count,  DAT, Haptoglobin, SPEP with IEP, free light chains, Copper and Zinc levels.  Given that patient is frail elderly will have to consider if endoscopy is advisable.    Therapeutics:  Since patient has symptomatic anemia with Hgb < 10  and has not tolerated/been compliant with oral iron will arrange for IV iron replacement.   Given the severe symptoms we prefer to  replete iron stores in one or two visits rather than over the course of several months.  In addition ongoing blood loss exceeds the capacity of oral iron to meet needs.  A discussion regarding risks was had with the patient.  IV iron has the potential to cause allergic reactions, including potentially life-threatening anaphylaxis.   IV iron may be associated with non-allergic infusion reactions including self-limiting urticaria, palpitations, dizziness, and neck and back spasm; generally, these occur in <1 percent of individuals and do not progress to more serious reactions. The non-allergic reaction consisting of flushing of the face and myalgias of the chest and back.   After discussion of the risks and benefits of IV iron therapy patient has elected to proceed with parenteral iron therapy.     Cancer Staging  No matching staging information was found for the patient.   No problem-specific  Assessment & Plan notes found for this encounter.   Orders Placed This Encounter  Procedures   CBC with Differential ([facility] Only)    Standing Status:   Future    Standing Expiration Date:   02/22/2023   CMP ([facility] only)    Standing Status:   Future    Standing Expiration Date:   02/22/2023   Ferritin    Standing Status:   Future    Standing Expiration Date:   02/22/2023   Folate    Standing Status:   Future    Standing Expiration Date:   02/22/2023   Haptoglobin    Standing Status:   Future    Standing Expiration Date:   02/22/2023   Kappa/lambda light chains    Standing Status:   Future    Standing Expiration Date:   02/22/2023   Multiple Myeloma Panel (SPEP&IFE w/QIG)    Standing Status:   Future    Standing Expiration Date:   02/22/2023   Vitamin B12    Standing Status:   Future    Standing Expiration Date:   02/22/2023   Reticulocytes    Standing Status:   Future    Standing Expiration Date:   02/22/2023   Zinc    Standing Status:   Future    Standing Expiration Date:    02/22/2023   Direct antiglobulin test (not at [facility])    Standing Status:   Future    Standing Expiration Date:   02/22/2023   45  minutes was spent in patient care.  This included time spent preparing to see the patient (e.g., review of tests), obtaining and/or reviewing separately obtained history, counseling and educating the patient/family/caregiver, ordering medications, tests, or procedures; documenting clinical information in the electronic or other health record, independently interpreting results and communicating results to the patient/family/caregiver as well as coordination of care.      All questions were answered. The patient knows to call the clinic with any problems, questions or concerns.  This note was electronically signed.    Barbee Cough, MD  02/21/2022 11:56 AM

## 2022-02-25 ENCOUNTER — Telehealth: Payer: Self-pay | Admitting: Oncology

## 2022-02-25 NOTE — Telephone Encounter (Signed)
Per incoming voicemail setup patients appointments. Patient is aware and confirmed date and time.

## 2022-02-27 ENCOUNTER — Other Ambulatory Visit: Payer: Self-pay

## 2022-02-27 ENCOUNTER — Inpatient Hospital Stay: Payer: Medicare PPO

## 2022-02-27 VITALS — BP 140/63 | HR 72 | Temp 97.7°F | Resp 18

## 2022-02-27 DIAGNOSIS — D649 Anemia, unspecified: Secondary | ICD-10-CM | POA: Diagnosis not present

## 2022-02-27 DIAGNOSIS — Z7982 Long term (current) use of aspirin: Secondary | ICD-10-CM | POA: Diagnosis not present

## 2022-02-27 DIAGNOSIS — K52831 Collagenous colitis: Secondary | ICD-10-CM | POA: Diagnosis not present

## 2022-02-27 DIAGNOSIS — I129 Hypertensive chronic kidney disease with stage 1 through stage 4 chronic kidney disease, or unspecified chronic kidney disease: Secondary | ICD-10-CM | POA: Diagnosis not present

## 2022-02-27 DIAGNOSIS — E785 Hyperlipidemia, unspecified: Secondary | ICD-10-CM | POA: Diagnosis not present

## 2022-02-27 DIAGNOSIS — E039 Hypothyroidism, unspecified: Secondary | ICD-10-CM | POA: Diagnosis not present

## 2022-02-27 DIAGNOSIS — D539 Nutritional anemia, unspecified: Secondary | ICD-10-CM

## 2022-02-27 DIAGNOSIS — N183 Chronic kidney disease, stage 3 unspecified: Secondary | ICD-10-CM | POA: Diagnosis not present

## 2022-02-27 DIAGNOSIS — Z7902 Long term (current) use of antithrombotics/antiplatelets: Secondary | ICD-10-CM | POA: Diagnosis not present

## 2022-02-27 DIAGNOSIS — Z7952 Long term (current) use of systemic steroids: Secondary | ICD-10-CM | POA: Diagnosis not present

## 2022-02-27 MED ORDER — SODIUM CHLORIDE 0.9 % IV SOLN: Freq: Once | INTRAVENOUS | Status: AC

## 2022-02-27 MED ORDER — SODIUM CHLORIDE 0.9 % IV SOLN
510.0000 mg | Freq: Once | INTRAVENOUS | Status: AC
  Administered 2022-02-27: 510 mg via INTRAVENOUS
  Filled 2022-02-27: qty 510

## 2022-02-27 MED ORDER — ACETAMINOPHEN 325 MG PO TABS
650.0000 mg | ORAL_TABLET | Freq: Once | ORAL | Status: AC
  Administered 2022-02-27: 650 mg via ORAL
  Filled 2022-02-27: qty 2

## 2022-02-27 NOTE — Patient Instructions (Signed)

## 2022-03-04 ENCOUNTER — Encounter: Payer: Self-pay | Admitting: Oncology

## 2022-03-04 ENCOUNTER — Telehealth: Payer: Self-pay | Admitting: Nurse Practitioner

## 2022-03-04 DIAGNOSIS — R54 Age-related physical debility: Secondary | ICD-10-CM | POA: Insufficient documentation

## 2022-03-04 MED ORDER — BUDESONIDE 3 MG PO CPEP: ORAL_CAPSULE | ORAL | 1 refills | Status: DC

## 2022-03-04 NOTE — Telephone Encounter (Signed)
Refilled [facility] 

## 2022-03-04 NOTE — Telephone Encounter (Signed)
Inbound call from patient requesting refill on Budesonide mediation.  Please advise.

## 2022-03-05 DIAGNOSIS — H26492 Other secondary cataract, left eye: Secondary | ICD-10-CM | POA: Diagnosis not present

## 2022-03-06 ENCOUNTER — Other Ambulatory Visit: Payer: Self-pay

## 2022-03-06 ENCOUNTER — Inpatient Hospital Stay: Payer: Medicare PPO | Attending: Oncology

## 2022-03-06 VITALS — BP 152/54 | HR 72 | Temp 98.2°F | Resp 18

## 2022-03-06 DIAGNOSIS — I129 Hypertensive chronic kidney disease with stage 1 through stage 4 chronic kidney disease, or unspecified chronic kidney disease: Secondary | ICD-10-CM | POA: Insufficient documentation

## 2022-03-06 DIAGNOSIS — M109 Gout, unspecified: Secondary | ICD-10-CM | POA: Insufficient documentation

## 2022-03-06 DIAGNOSIS — N189 Chronic kidney disease, unspecified: Secondary | ICD-10-CM | POA: Insufficient documentation

## 2022-03-06 DIAGNOSIS — Z8673 Personal history of transient ischemic attack (TIA), and cerebral infarction without residual deficits: Secondary | ICD-10-CM | POA: Insufficient documentation

## 2022-03-06 DIAGNOSIS — D649 Anemia, unspecified: Secondary | ICD-10-CM | POA: Insufficient documentation

## 2022-03-06 DIAGNOSIS — E785 Hyperlipidemia, unspecified: Secondary | ICD-10-CM | POA: Insufficient documentation

## 2022-03-06 DIAGNOSIS — Z79899 Other long term (current) drug therapy: Secondary | ICD-10-CM | POA: Diagnosis not present

## 2022-03-06 DIAGNOSIS — D539 Nutritional anemia, unspecified: Secondary | ICD-10-CM

## 2022-03-06 MED ORDER — ACETAMINOPHEN 325 MG PO TABS
650.0000 mg | ORAL_TABLET | Freq: Once | ORAL | Status: AC
  Administered 2022-03-06: 650 mg via ORAL
  Filled 2022-03-06: qty 2

## 2022-03-06 MED ORDER — SODIUM CHLORIDE 0.9 % IV SOLN
510.0000 mg | Freq: Once | INTRAVENOUS | Status: AC
  Administered 2022-03-06: 510 mg via INTRAVENOUS
  Filled 2022-03-06: qty 17

## 2022-03-06 MED ORDER — SODIUM CHLORIDE 0.9 % IV SOLN: Freq: Once | INTRAVENOUS | Status: AC

## 2022-03-06 NOTE — Progress Notes (Signed)
Patient declined to wait her 30 minute post iron infusion. Patinet to,erated treatment well with no issues. Vss at discharge.

## 2022-03-20 ENCOUNTER — Inpatient Hospital Stay: Payer: Medicare PPO

## 2022-03-20 ENCOUNTER — Inpatient Hospital Stay: Payer: Medicare PPO | Admitting: Oncology

## 2022-03-23 ENCOUNTER — Inpatient Hospital Stay: Payer: Medicare PPO | Admitting: Nurse Practitioner

## 2022-03-23 ENCOUNTER — Inpatient Hospital Stay: Payer: Medicare PPO

## 2022-03-23 ENCOUNTER — Encounter: Payer: Self-pay | Admitting: Nurse Practitioner

## 2022-03-23 ENCOUNTER — Telehealth: Payer: Self-pay | Admitting: Oncology

## 2022-03-23 VITALS — BP 145/72 | HR 97 | Temp 98.1°F | Resp 18 | Ht 63.0 in | Wt 147.0 lb

## 2022-03-23 DIAGNOSIS — D539 Nutritional anemia, unspecified: Secondary | ICD-10-CM

## 2022-03-23 DIAGNOSIS — E785 Hyperlipidemia, unspecified: Secondary | ICD-10-CM | POA: Diagnosis not present

## 2022-03-23 DIAGNOSIS — D649 Anemia, unspecified: Secondary | ICD-10-CM | POA: Diagnosis not present

## 2022-03-23 DIAGNOSIS — N1832 Chronic kidney disease, stage 3b: Secondary | ICD-10-CM

## 2022-03-23 DIAGNOSIS — I129 Hypertensive chronic kidney disease with stage 1 through stage 4 chronic kidney disease, or unspecified chronic kidney disease: Secondary | ICD-10-CM | POA: Diagnosis not present

## 2022-03-23 DIAGNOSIS — Z79899 Other long term (current) drug therapy: Secondary | ICD-10-CM | POA: Diagnosis not present

## 2022-03-23 DIAGNOSIS — M109 Gout, unspecified: Secondary | ICD-10-CM | POA: Diagnosis not present

## 2022-03-23 DIAGNOSIS — N189 Chronic kidney disease, unspecified: Secondary | ICD-10-CM | POA: Diagnosis not present

## 2022-03-23 DIAGNOSIS — Z8673 Personal history of transient ischemic attack (TIA), and cerebral infarction without residual deficits: Secondary | ICD-10-CM | POA: Diagnosis not present

## 2022-03-23 LAB — CMP (CANCER CENTER ONLY)
ALT: 17 U/L (ref 0–44)
AST: 19 U/L (ref 15–41)
Albumin: 4.2 g/dL (ref 3.5–5.0)
Alkaline Phosphatase: 63 U/L (ref 38–126)
Anion gap: 8 (ref 5–15)
BUN: 21 mg/dL (ref 8–23)
CO2: 24 mmol/L (ref 22–32)
Calcium: 9.9 mg/dL (ref 8.9–10.3)
Chloride: 112 mmol/L — ABNORMAL HIGH (ref 98–111)
Creatinine: 1.1 mg/dL — ABNORMAL HIGH (ref 0.44–1.00)
GFR, Estimated: 48 mL/min — ABNORMAL LOW (ref >60)
Glucose, Bld: 108 mg/dL — ABNORMAL HIGH (ref 70–99)
Potassium: 3.4 mmol/L — ABNORMAL LOW (ref 3.5–5.1)
Sodium: 144 mmol/L (ref 135–145)
Total Bilirubin: 0.7 mg/dL (ref 0.3–1.2)
Total Protein: 6.9 g/dL (ref 6.5–8.1)

## 2022-03-23 LAB — CBC WITH DIFFERENTIAL (CANCER CENTER ONLY)
Abs Immature Granulocytes: 0.06 10*3/uL (ref 0.00–0.07)
Basophils Absolute: 0.1 10*3/uL (ref 0.0–0.1)
Basophils Relative: 1 %
Eosinophils Absolute: 1 10*3/uL — ABNORMAL HIGH (ref 0.0–0.5)
Eosinophils Relative: 7 %
HCT: 36.7 % (ref 36.0–46.0)
Hemoglobin: 11.4 g/dL — ABNORMAL LOW (ref 12.0–15.0)
Immature Granulocytes: 1 %
Lymphocytes Relative: 13 %
Lymphs Abs: 1.7 10*3/uL (ref 0.7–4.0)
MCH: 27.2 pg (ref 26.0–34.0)
MCHC: 31.1 g/dL (ref 30.0–36.0)
MCV: 87.6 fL (ref 80.0–100.0)
Monocytes Absolute: 0.7 10*3/uL (ref 0.1–1.0)
Monocytes Relative: 5 %
Neutro Abs: 9.7 10*3/uL — ABNORMAL HIGH (ref 1.7–7.7)
Neutrophils Relative %: 73 %
Platelet Count: 207 10*3/uL (ref 150–400)
RBC: 4.19 MIL/uL (ref 3.87–5.11)
RDW: 25.3 % — ABNORMAL HIGH (ref 11.5–15.5)
WBC Count: 13.2 10*3/uL — ABNORMAL HIGH (ref 4.0–10.5)
nRBC: 0 % (ref 0.0–0.2)

## 2022-03-23 LAB — RETICULOCYTES
Immature Retic Fract: 26.6 % — ABNORMAL HIGH (ref 2.3–15.9)
RBC.: 4.22 MIL/uL (ref 3.87–5.11)
Retic Count, Absolute: 105.5 10*3/uL (ref 19.0–186.0)
Retic Ct Pct: 2.5 % (ref 0.4–3.1)

## 2022-03-23 LAB — VITAMIN B12: Vitamin B-12: 203 pg/mL (ref 180–914)

## 2022-03-23 LAB — FERRITIN: Ferritin: 437 ng/mL — ABNORMAL HIGH (ref 11–307)

## 2022-03-23 LAB — FOLATE: Folate: 30.4 ng/mL (ref >5.9)

## 2022-03-23 LAB — PROTIME-INR
INR: 1.1 (ref 0.8–1.2)
Prothrombin Time: 13.9 seconds (ref 11.4–15.2)

## 2022-03-23 LAB — DIRECT ANTIGLOBULIN TEST (NOT AT ARMC)
DAT, IgG: NEGATIVE
DAT, complement: NEGATIVE

## 2022-03-23 LAB — FIBRINOGEN: Fibrinogen: 427 mg/dL (ref 210–475)

## 2022-03-23 LAB — APTT: aPTT: 26 seconds (ref 24–36)

## 2022-03-23 NOTE — Progress Notes (Signed)
  Sharon Bailey OFFICE PROGRESS NOTE   Diagnosis: Anemia  INTERVAL HISTORY:   Sharon Bailey is an 89 year old woman followed by Dr. Candie Bailey for anemia felt to be multifactorial possibly due to iron deficiency from occult GI blood loss, CKD, chronic inflammation.  She was given Feraheme 510 mg on 02/27/2022 and 03/06/2022.  She reports tolerating IV iron well.  No signs of reaction.  She in general is feeling better, notes more energy.  She is not aware of any bleeding but does report a stool sample was positive for blood in February.  She has had no change in bowel habits.  No abdominal pain.  She has a good appetite.  Objective:  Vital signs in last 24 hours:  Blood pressure (!) 145/72, pulse 97, temperature 98.1 F (36.7 C), temperature source Oral, resp. rate 18, height 5\' 3"  (1.6 m), weight 147 lb (66.7 kg), SpO2 100 %.    Resp: Lungs clear bilaterally. Cardio: Regular rate and rhythm. GI: Abdomen soft and nontender.  No hepatosplenomegaly. Vascular: Trace edema lower leg bilaterally, chronic stasis change.   Lab Results:  Lab Results  Component Value Date   WBC 13.2 (H) 03/23/2022   HGB 11.4 (L) 03/23/2022   HCT 36.7 03/23/2022   MCV 87.6 03/23/2022   PLT 207 03/23/2022   NEUTROABS 9.7 (H) 03/23/2022    Imaging:  No results found.  Medications: I have reviewed the patient's current medications.  Assessment/Plan: Anemia status post Feraheme 02/27/2022 and 03/06/2022 CKD Collagenous colitis Hypertension Hyperlipidemia History of CVA Gout  Disposition: Sharon Bailey received Feraheme 02/27/2022 and 03/06/2022.  There has been partial correction of the hemoglobin, 11.4 today.  She reports a stool card returned positive for blood in February.  We will defer further evaluation of this to gastroenterology.  She would like to discuss the positive stool card with her PCP prior to additional evaluation.   She and her family member understand the anemia may be  multifactorial.  She will follow-up with Dr. Candie Bailey in about 3 weeks for another CBC and to review the outstanding labs from today.  Ned Card, NP

## 2022-03-23 NOTE — Telephone Encounter (Signed)
Scheduled per 03/15 los, patient has been called and notified. 

## 2022-03-24 LAB — KAPPA/LAMBDA LIGHT CHAINS
Kappa free light chain: 53.1 mg/L — ABNORMAL HIGH (ref 3.3–19.4)
Kappa, lambda light chain ratio: 1.9 — ABNORMAL HIGH (ref 0.26–1.65)
Lambda free light chains: 27.9 mg/L — ABNORMAL HIGH (ref 5.7–26.3)

## 2022-03-24 LAB — HAPTOGLOBIN: Haptoglobin: 136 mg/dL (ref 41–333)

## 2022-03-25 LAB — ZINC: Zinc: 63 ug/dL (ref 44–115)

## 2022-03-27 LAB — MULTIPLE MYELOMA PANEL, SERUM
Albumin SerPl Elph-Mcnc: 3.7 g/dL (ref 2.9–4.4)
Albumin/Glob SerPl: 1.4 (ref 0.7–1.7)
Alpha 1: 0.3 g/dL (ref 0.0–0.4)
Alpha2 Glob SerPl Elph-Mcnc: 0.9 g/dL (ref 0.4–1.0)
B-Globulin SerPl Elph-Mcnc: 0.9 g/dL (ref 0.7–1.3)
Gamma Glob SerPl Elph-Mcnc: 0.8 g/dL (ref 0.4–1.8)
Globulin, Total: 2.8 g/dL (ref 2.2–3.9)
IgA: 184 mg/dL (ref 64–422)
IgG (Immunoglobin G), Serum: 860 mg/dL (ref 586–1602)
IgM (Immunoglobulin M), Srm: 95 mg/dL (ref 26–217)
Total Protein ELP: 6.5 g/dL (ref 6.0–8.5)

## 2022-04-08 DIAGNOSIS — H353221 Exudative age-related macular degeneration, left eye, with active choroidal neovascularization: Secondary | ICD-10-CM | POA: Diagnosis not present

## 2022-04-16 ENCOUNTER — Other Ambulatory Visit: Payer: Self-pay | Admitting: Medical Oncology

## 2022-04-16 NOTE — Progress Notes (Unsigned)
CC Cassie

## 2022-04-16 NOTE — Progress Notes (Addendum)
[facility] OFFICE PROGRESS NOTE  Clovis Riley, L.August Saucer, MD [address] [place] [address]  DIAGNOSIS: Anemia: Etiology felt to be multifactorial with possible play being related to iron deficiency from occult GI blood loss due to colitis, CKD, and chronic inflammation  PRIOR THERAPY: Feraheme 510 mg, most recent dose on 03/07/22  CURRENT THERAPY: Oral Iron supplement  INTERVAL HISTORY: JAEDON BARRERA 89 y.o. female returns to the clinic today for a follow-up visit accompanied by her daughter.  The patient was referred to the clinic and establish care with Dr. Angelene Giovanni on 02/21/22 for anemia.  This was felt to be multifactorial with occult GI blood loss colitis, CKD, and chronic inflammation. Of note, the patient does bruise easily and has extensive senile purpura on her upper and lower extremities. Her bleeding studies were normal. She is compliant with her iron supplement daily. She underwent iron infusions with Feraheme on 2/23 and 3/2.  She followed up with Lonna Cobb, nurse practitioner, on 03/23/2022 and had improvement in her fatigue. Of course, due to her age and chronic back pain, she does tire quicker when she exerts herself. She had several lab studies performed at her last visit which were fairly unremarkable. Her ferritin was elevated. Her folate was normal. Her DAT testing negative.  Myeloma panel without any M spike.  Her haptoglobin is normal.  Her kappa/lambda light chain she had some elevated kappa free light chain at 53.1 elevated lambda light chains at 27.9 slightly elevated and slightly elevated kappa lambda light chain ratio 1.9.  Her vitamin B12 was on the low end of normal. She used to take B12 supplements OTC in the past but has not been taking them recently.  She had stool cards performed in February which were positive for blood.  Given her age, it sounds like GI would not recommend invasive procedure. She is empirically on Protonix. Of note, the  patient previously was taking NSAIDs 2-3x per week due to back pain, which she has since stopped. She understands that tylenol is preferred.  She currently is on 81 mg aspirin.  Denies any obvious bleeding except she does have senile purpura. She has some elevated blood pressure today since she was in a rush this morning and forgot to take her antihypertensive. She is asymptomatic. She will take this upon returning home.  She is here today for evaluation and repeat blood work.  She also had some labs performed    MEDICAL HISTORY: Past Medical History:  Diagnosis Date   Abnormal EKG    Collagenous colitis    Collagenous colitis    Diverticulitis    Diverticulosis    Fluttering heart    GERD (gastroesophageal reflux disease)    Gout    Gout attack 09/28/2012   Hammer toe of left foot 09/28/2012   Hyperlipidemia    Hypertension    Hypertensive emergency    Hypothyroidism    Irregular heart beat    Pain in joint, ankle and foot 09/28/2012   Status post foot surgery 10/11/2012   Thyroid disease     ALLERGIES:  is allergic to ciprofloxacin and sulfamethoxazole-trimethoprim.  MEDICATIONS:  Current Outpatient Medications  Medication Sig Dispense Refill   allopurinol (ZYLOPRIM) 300 MG tablet Take 300 mg by mouth daily.     amLODipine (NORVASC) 5 MG tablet Take 1 tablet (5 mg total) by mouth daily. 90 tablet 3   budesonide (ENTOCORT EC) 3 MG 24 hr capsule TAKE 1 CAPSULE EVERY DAY  AS DIRECTED 90 capsule 1   ferrous sulfate 325 (65 FE) MG EC tablet Take 325 mg by mouth daily with breakfast.     levothyroxine (SYNTHROID) 88 MCG tablet Take 88 mcg by mouth daily before breakfast.     pregabalin (LYRICA) 50 MG capsule 1 tablet at night for one week, then 1 tablet in the morning and 1 tablet at night. (Patient not taking: Reported on 02/17/2022) 60 capsule 1   simvastatin (ZOCOR) 40 MG tablet Take 40 mg by mouth daily.     valsartan (DIOVAN) 160 MG tablet Take 1 tablet (160 mg total) by mouth  daily. 90 tablet 3   vitamin B-12 1000 MCG tablet Take 1 tablet (1,000 mcg total) by mouth daily.     No current facility-administered medications for this visit.    SURGICAL HISTORY:  Past Surgical History:  Procedure Laterality Date   ABDOMINAL HYSTERECTOMY     ADENOIDECTOMY     CHOLECYSTECTOMY     Excision Ganglion Toe Left 09/29/2012   Lt #2 @ [facility]   Hammer toe repair Left 09/29/2012   Lt #2 @ [facility]   TONSILLECTOMY      REVIEW OF SYSTEMS:   Review of Systems  Constitutional: Positive for improving fatigue. Negative for appetite change, chills, fever and unexpected weight change.  HENT: Negative for mouth sores, nosebleeds, sore throat and trouble swallowing.   Eyes: Negative for eye problems and icterus.  Respiratory: Negative for cough, hemoptysis, shortness of breath and wheezing.   Cardiovascular: Negative for chest pain and leg swelling.  Gastrointestinal: Negative for abdominal pain, constipation, diarrhea, nausea and vomiting.  Genitourinary: Negative for bladder incontinence, difficulty urinating, dysuria, frequency and hematuria.   Musculoskeletal: Positive for chronic back pain. Negative for gait problem, neck pain and neck stiffness.  Skin: Positive for senile purpura on upper and lower extremities. Negative for itching and rash.  Neurological: Negative for dizziness, extremity weakness, gait problem, headaches, light-headedness and seizures.  Hematological: Negative for adenopathy. Denies bleeding. Positive for significant senile purpura on upper and lower extremities.  Psychiatric/Behavioral: Negative for confusion, depression and sleep disturbance. The patient is not nervous/anxious.     PHYSICAL EXAMINATION:  There were no vitals taken for this visit.  ECOG PERFORMANCE STATUS: 1  Physical Exam  Constitutional: Oriented to person, place, and time and well-developed, well-nourished, and in no distress.  HENT:  Head: Normocephalic and atraumatic.  Mouth/Throat:  Oropharynx is clear and moist. No oropharyngeal exudate.  Eyes: Conjunctivae are normal. Right eye exhibits no discharge. Left eye exhibits no discharge. No scleral icterus.  Neck: Normal range of motion. Neck supple.  Cardiovascular: Normal rate, regular rhythm, systolic murmur and intact distal pulses.   Pulmonary/Chest: Effort normal and breath sounds normal. No respiratory distress. No wheezes. No rales.  Abdominal: Soft. Bowel sounds are normal. Exhibits no distension and no mass. There is no tenderness.  Musculoskeletal: Normal range of motion. Exhibits no edema.  Lymphadenopathy:    No cervical adenopathy.  Neurological: Alert and oriented to person, place, and time. Exhibits normal muscle tone. Gait normal. Coordination normal.  Skin: positive for senile purpura. Skin is warm and dry. No rash noted. Not diaphoretic. No erythema. No pallor.  Psychiatric: Mood, memory and judgment normal.  Vitals reviewed.  LABORATORY DATA: Lab Results  Component Value Date   WBC 13.2 (H) 03/23/2022   HGB 11.4 (L) 03/23/2022   HCT 36.7 03/23/2022   MCV 87.6 03/23/2022   PLT 207 03/23/2022  Chemistry      Component Value Date/Time   NA 144 03/23/2022 1250   NA 145 (H) 11/20/2020 1409   K 3.4 (L) 03/23/2022 1250   CL 112 (H) 03/23/2022 1250   CO2 24 03/23/2022 1250   BUN 21 03/23/2022 1250   BUN 23 11/20/2020 1409   CREATININE 1.10 (H) 03/23/2022 1250      Component Value Date/Time   CALCIUM 9.9 03/23/2022 1250   ALKPHOS 63 03/23/2022 1250   AST 19 03/23/2022 1250   ALT 17 03/23/2022 1250   BILITOT 0.7 03/23/2022 1250       RADIOGRAPHIC STUDIES:  No results found.   ASSESSMENT/PLAN:  This is a very pleasant 89 year old female referred to the clinic for anemia which is felt to be multifactorial in the setting of iron deficiency possibly due to occult GI blood loss from colitis, CKD, chronic inflammation. She also has significant senile purpura on her upper and lower  extremities.   The patient had several lab studies performed on 03/23/2022.  I reviewed the results with Dr. Angelene Giovanniibakove. Who recommends follow up with him in 1 month.   I reviewed the labs in detail with her daughter and the patient.   Her B12 is borderline low. Recommend she resume taking OTC B12. Her Hemoglobin is improving to 11.8.   We discussed avoiding NSAIDs. Tylenol would be preferred.   She will continue her iron iron supplement. She will continue to follow with Dr. Marina GoodellPerry from GI. She is on protonix.   Her BP was elevated today. She is asymptomatic. She forgot her antihypertensive today. She will recheck this at home and ensure improvement. She knows to seek emergency evaluation if she has any new or worsening symptoms such as headaches, vision changes, or chest pain.   The patient was advised to call immediately if she has any concerning symptoms in the interval. The patient voices understanding of current disease status and treatment options and is in agreement with the current care plan. All questions were answered. The patient knows to call the clinic with any problems, questions or concerns. We can certainly see the patient much sooner if necessary   No orders of the defined types were placed in this encounter.    The total time spent in the appointment was 20-29 minutes  Isauro Skelley L Nitza Schmid, PA-C 04/16/22

## 2022-04-17 ENCOUNTER — Inpatient Hospital Stay: Payer: Medicare PPO

## 2022-04-17 ENCOUNTER — Inpatient Hospital Stay: Payer: Medicare PPO | Attending: Oncology

## 2022-04-17 ENCOUNTER — Other Ambulatory Visit: Payer: Self-pay | Admitting: Physician Assistant

## 2022-04-17 ENCOUNTER — Other Ambulatory Visit: Payer: Self-pay

## 2022-04-17 ENCOUNTER — Inpatient Hospital Stay: Payer: Medicare PPO | Admitting: Physician Assistant

## 2022-04-17 ENCOUNTER — Inpatient Hospital Stay: Payer: Medicare PPO | Admitting: Oncology

## 2022-04-17 VITALS — BP 187/98 | HR 87 | Temp 97.9°F | Resp 18 | Wt 145.8 lb

## 2022-04-17 DIAGNOSIS — D649 Anemia, unspecified: Secondary | ICD-10-CM | POA: Insufficient documentation

## 2022-04-17 DIAGNOSIS — D692 Other nonthrombocytopenic purpura: Secondary | ICD-10-CM | POA: Insufficient documentation

## 2022-04-17 DIAGNOSIS — Z79899 Other long term (current) drug therapy: Secondary | ICD-10-CM | POA: Diagnosis not present

## 2022-04-17 DIAGNOSIS — D539 Nutritional anemia, unspecified: Secondary | ICD-10-CM

## 2022-04-17 LAB — CBC WITH DIFFERENTIAL (CANCER CENTER ONLY)
Abs Immature Granulocytes: 0.04 10*3/uL (ref 0.00–0.07)
Basophils Absolute: 0.1 10*3/uL (ref 0.0–0.1)
Basophils Relative: 1 %
Eosinophils Absolute: 0.7 10*3/uL — ABNORMAL HIGH (ref 0.0–0.5)
Eosinophils Relative: 8 %
HCT: 36.9 % (ref 36.0–46.0)
Hemoglobin: 11.8 g/dL — ABNORMAL LOW (ref 12.0–15.0)
Immature Granulocytes: 0 %
Lymphocytes Relative: 26 %
Lymphs Abs: 2.4 10*3/uL (ref 0.7–4.0)
MCH: 28.6 pg (ref 26.0–34.0)
MCHC: 32 g/dL (ref 30.0–36.0)
MCV: 89.6 fL (ref 80.0–100.0)
Monocytes Absolute: 0.6 10*3/uL (ref 0.1–1.0)
Monocytes Relative: 6 %
Neutro Abs: 5.4 10*3/uL (ref 1.7–7.7)
Neutrophils Relative %: 59 %
Platelet Count: 178 10*3/uL (ref 150–400)
RBC: 4.12 MIL/uL (ref 3.87–5.11)
RDW: 22.2 % — ABNORMAL HIGH (ref 11.5–15.5)
WBC Count: 9.2 10*3/uL (ref 4.0–10.5)
nRBC: 0 % (ref 0.0–0.2)

## 2022-04-29 DIAGNOSIS — L298 Other pruritus: Secondary | ICD-10-CM | POA: Diagnosis not present

## 2022-05-12 ENCOUNTER — Ambulatory Visit: Payer: Medicare PPO | Admitting: Podiatry

## 2022-05-12 DIAGNOSIS — M79674 Pain in right toe(s): Secondary | ICD-10-CM | POA: Diagnosis not present

## 2022-05-12 DIAGNOSIS — M79675 Pain in left toe(s): Secondary | ICD-10-CM | POA: Diagnosis not present

## 2022-05-12 DIAGNOSIS — B351 Tinea unguium: Secondary | ICD-10-CM | POA: Diagnosis not present

## 2022-05-12 DIAGNOSIS — I739 Peripheral vascular disease, unspecified: Secondary | ICD-10-CM

## 2022-05-12 NOTE — Progress Notes (Unsigned)
  Subjective:  Patient ID: Sharon Bailey, female    DOB: 1930-07-02,  MRN: [id number]  Sharon Bailey presents to clinic today for {jgcomplaint:23593}  Chief Complaint  Patient presents with   routine foot care    New problem(s): None. {jgcomplaint:23593}  PCP is Clovis Riley, L.August Saucer, MD.  Allergies  Allergen Reactions   Ciprofloxacin Hives   Sulfamethoxazole-Trimethoprim Diarrhea and Nausea Only    Other reaction(s): diarrhea    Review of Systems: Negative except as noted in the HPI.  Objective: No changes noted in today's physical examination. There were no vitals filed for this visit. Sharon Bailey is a pleasant 89 y.o. female WD, WN in NAD. AAO x 3.  Vascular Examination: Vascular status intact b/l with palpable DP pulses b/l. PT pulses nonpalpable b/l. CFT immediate b/l. No edema. No pain with calf compression b/l. Skin temperature gradient WNL b/l. Pedal hair absent.  Neurological Examination: Sensation grossly intact b/l with 10 gram monofilament. Vibratory sensation intact b/l.   Dermatological Examination: Pedal skin with normal turgor, texture and tone b/l. Toenails 1-5 b/l thick, discolored, elongated with subungual debris and pain on dorsal palpation. No hyperkeratotic lesions noted b/l.   Musculoskeletal Examination: Muscle strength 5/5 to b/l LE. Hammertoe deformity noted 2-5 b/l. Patient ambulates independent of any assistive aids. Assessment/Plan: 1. Pain due to onychomycosis of toenails of both feet   2. PAD (peripheral artery disease) (HCC)     No orders of the defined types were placed in this encounter.   None {Jgplan:23602::"-Patient/POA to call should there be question/concern in the interim."}   No follow-ups on file.  Freddie Breech, DPM

## 2022-05-13 ENCOUNTER — Encounter: Payer: Self-pay | Admitting: Podiatry

## 2022-05-26 DIAGNOSIS — H353221 Exudative age-related macular degeneration, left eye, with active choroidal neovascularization: Secondary | ICD-10-CM | POA: Diagnosis not present

## 2022-05-28 ENCOUNTER — Other Ambulatory Visit: Payer: Self-pay | Admitting: Oncology

## 2022-05-28 DIAGNOSIS — D649 Anemia, unspecified: Secondary | ICD-10-CM

## 2022-05-28 NOTE — Progress Notes (Unsigned)
[facility] Follow up Visit:  Patient Care Team: Clovis Riley, L.August Saucer, MD as PCP - General (Family Medicine) Jodelle Red, MD as PCP - Cardiology (Cardiology)  CHIEF COMPLAINTS/PURPOSE OF CONSULTATION:   HISTORY OF PRESENTING ILLNESS: Sharon Bailey 89 y.o. female is here because of anemia  Medical history notable for collagenous colitis treated with budesinolide, diverticulitis, GERD, gout, hyperlipidemia, hypertension, thyroid disease, cardiac arrhythmia, chronic kidney disease  December 20 2014:  Colonoscopy.  Severe diverticulosis in sigmoid colon  February 12, 2022: WBC 9.4 hemoglobin 7.3 MCV 79 platelet count 221; 82 segs 11 lymphs 4 monos 3 eos Ferritin 33   Chemistries notable for creatinine 1.18  February 21 2022:  [facility]  Colitis overall under good control with budesinolide.   Has been taking oral iron for the past week.  No history of IV iron nor required PRBC's in the past.  Has tolerated oral iron.  Has a regular diet.  No history of postpartum hemorrhage requiring transfusion.   No history of hemorrhage postoperatively requiring transfusion.  No hematochezia, melena, hemoptysis, hematuria.   No history of intra-articular or soft tissue bleeding.  Uses ibuprofen about 2 to 3 times a week for pain.  Is not taking oral anticoagulants but does take ASA daily as an antiplatelet drugs.  No history of abnormal bleeding in family members.  Patient has symptoms of fatigue, pallor,  DOE, intolerance to cold, decreased performance status.  Patient has pica to ice but not starch/dirt.  Has never undergone an EGD.    March 23, 2022: WBC 13.2 hemoglobin 11.4 platelet count 208; 73 segs 13 lymphs 5 monos 7 eos 1 basophil reticulocyte count 2.5% Coombs test negative  Haptoglobin 136 SPEP with IEP showed no paraprotein.  Serum free kappa 53.1 lambda 27.9 with a kappa lambda 1.90 IgG 860 IgA 184 IgM 95 INR 1.1 PTT 26  Ferritin 437 folate  30.4 B12 203 zinc 23. CMP notable for potassium 3.4 creatinine 1.1 glucose 108  May 29 2022:  Scheduled follow up for anemia.  Remains on oral iron and vitamin B12 daily which she is tolerating well.  Occasional flare of colitis which can last a day.  She is followed by GI and was last seen there in January 2024.  Experiencing generalized itching helped briefly by lotions.    WBC 13.9 hemoglobin 12.7 MCV 92 platelet count 224.  ANC 11.5 CMP notable for creatinine 1.33 glucose 100 calcium 8.6 albumin 4.2  Review of Systems - Oncology  MEDICAL HISTORY: Past Medical History:  Diagnosis Date   Abnormal EKG    Collagenous colitis    Collagenous colitis    Diverticulitis    Diverticulosis    Fluttering heart    GERD (gastroesophageal reflux disease)    Gout    Gout attack 09/28/2012   Hammer toe of left foot 09/28/2012   Hyperlipidemia    Hypertension    Hypertensive emergency    Hypothyroidism    Irregular heart beat    Pain in joint, ankle and foot 09/28/2012   Status post foot surgery 10/11/2012   Thyroid disease     SURGICAL HISTORY: Past Surgical History:  Procedure Laterality Date   ABDOMINAL HYSTERECTOMY     ADENOIDECTOMY     CHOLECYSTECTOMY     Excision Ganglion Toe Left 09/29/2012   Lt #2 @ [facility]   Hammer toe repair Left 09/29/2012   Lt #2 @ [facility]   TONSILLECTOMY      SOCIAL HISTORY:  Social History   Socioeconomic History   Marital status: Married    Spouse name: Not on file   Number of children: 3   Years of education: 12   Highest education level: 12th grade  Occupational History   Not on file  Tobacco Use   Smoking status: Never   Smokeless tobacco: Never  Vaping Use   Vaping Use: Never used  Substance and Sexual Activity   Alcohol use: No    Alcohol/week: 0.0 standard drinks of alcohol   Drug use: No   Sexual activity: Not Currently  Other Topics Concern   Not on file  Social History Narrative   Not on file   Social Determinants of Health    Financial Resource Strain: Not on file  Food Insecurity: No Food Insecurity (02/21/2022)   Hunger Vital Sign    Worried About Running Out of Food in the Last Year: Never true    Ran Out of Food in the Last Year: Never true  Transportation Needs: No Transportation Needs (02/21/2022)   PRAPARE - Administrator, Civil Service (Medical): No    Lack of Transportation (Non-Medical): No  Physical Activity: Not on file  Stress: Not on file  Social Connections: Not on file  Intimate Partner Violence: Not At Risk (02/21/2022)   Humiliation, Afraid, Rape, and Kick questionnaire    Fear of Current or Ex-Partner: No    Emotionally Abused: No    Physically Abused: No    Sexually Abused: No    FAMILY HISTORY Family History  Problem Relation Age of Onset   Breast cancer Mother    Memory loss Father    Heart disease Sister    Rheumatic fever Sister    Lung disease Brother    Colon cancer Neg Hx     ALLERGIES:  is allergic to ciprofloxacin and sulfamethoxazole-trimethoprim.  MEDICATIONS:  Current Outpatient Medications  Medication Sig Dispense Refill   allopurinol (ZYLOPRIM) 300 MG tablet Take 300 mg by mouth daily.     amLODipine (NORVASC) 5 MG tablet Take 1 tablet (5 mg total) by mouth daily. 90 tablet 3   budesonide (ENTOCORT EC) 3 MG 24 hr capsule TAKE 1 CAPSULE EVERY DAY AS DIRECTED 90 capsule 1   ferrous sulfate 325 (65 FE) MG EC tablet Take 325 mg by mouth daily with breakfast.     levothyroxine (SYNTHROID) 88 MCG tablet Take 88 mcg by mouth daily before breakfast.     simvastatin (ZOCOR) 40 MG tablet Take 40 mg by mouth daily.     valsartan (DIOVAN) 160 MG tablet Take 1 tablet (160 mg total) by mouth daily. 90 tablet 3   vitamin B-12 1000 MCG tablet Take 1 tablet (1,000 mcg total) by mouth daily.     No current facility-administered medications for this visit.    PHYSICAL EXAMINATION:  ECOG PERFORMANCE STATUS: 1 - Symptomatic but completely ambulatory   Vitals:    05/29/22 1354  BP: (!) 147/80  Pulse: 74  Resp: 17  Temp: (!) 97.5 F (36.4 C)  SpO2: 98%    Filed Weights   05/29/22 1354  Weight: 143 lb 1.6 oz (64.9 kg)     Physical Exam Vitals and nursing note reviewed.  Constitutional:      Appearance: Normal appearance. She is ill-appearing. She is not toxic-appearing or diaphoretic.     Comments: Here with daughter  HENT:     Head: Normocephalic and atraumatic.     Right Ear: External ear normal.  Left Ear: External ear normal.     Nose: Nose normal. No congestion or rhinorrhea.  Eyes:     General: No scleral icterus.    Extraocular Movements: Extraocular movements intact.     Conjunctiva/sclera: Conjunctivae normal.     Pupils: Pupils are equal, round, and reactive to light.  Cardiovascular:     Rate and Rhythm: Normal rate.     Heart sounds: No murmur heard.    No friction rub. No gallop.  Abdominal:     General: Bowel sounds are normal.     Palpations: Abdomen is soft.  Musculoskeletal:        General: No swelling, tenderness or deformity.     Cervical back: Normal range of motion and neck supple. No rigidity or tenderness.  Lymphadenopathy:     Head:     Right side of head: No submental, submandibular, tonsillar, preauricular, posterior auricular or occipital adenopathy.     Left side of head: No submental, submandibular, tonsillar, preauricular, posterior auricular or occipital adenopathy.     Cervical: No cervical adenopathy.     Right cervical: No superficial, deep or posterior cervical adenopathy.    Left cervical: No superficial, deep or posterior cervical adenopathy.     Upper Body:     Right upper body: No supraclavicular, axillary, pectoral or epitrochlear adenopathy.     Left upper body: No supraclavicular, axillary, pectoral or epitrochlear adenopathy.  Skin:    General: Skin is warm.     Coloration: Skin is not jaundiced.     Findings: Bruising present.     Comments: Senile purpura on extensor  surfaces Also has some bruising on left neck  Neurological:     General: No focal deficit present.     Mental Status: She is alert and oriented to person, place, and time.     Cranial Nerves: Cranial nerve deficit present.     Gait: Gait abnormal.     Comments: Has hearing aids.  Ambulates with a cane  Psychiatric:        Mood and Affect: Mood normal.        Behavior: Behavior normal.        Thought Content: Thought content normal.        Judgment: Judgment normal.     LABORATORY DATA: I have personally reviewed the data as listed:  Office Visit on 05/29/2022  Component Date Value Ref Range Status   WBC 05/29/2022 13.9 (H)  4.0 - 10.5 K/uL Final   RBC 05/29/2022 4.25  3.87 - 5.11 MIL/uL Final   Hemoglobin 05/29/2022 12.7  12.0 - 15.0 g/dL Final   HCT 60/45/4098 39.0  36.0 - 46.0 % Final   MCV 05/29/2022 91.8  80.0 - 100.0 fL Final   MCH 05/29/2022 29.9  26.0 - 34.0 pg Final   MCHC 05/29/2022 32.6  30.0 - 36.0 g/dL Final   RDW 11/91/4782 17.2 (H)  11.5 - 15.5 % Final   Platelets 05/29/2022 224  150 - 400 K/uL Final   nRBC 05/29/2022 0.0  0.0 - 0.2 % Final   Neutrophils Relative % 05/29/2022 83  % Final   Neutro Abs 05/29/2022 11.5 (H)  1.7 - 7.7 K/uL Final   Lymphocytes Relative 05/29/2022 12  % Final   Lymphs Abs 05/29/2022 1.7  0.7 - 4.0 K/uL Final   Monocytes Relative 05/29/2022 3  % Final   Monocytes Absolute 05/29/2022 0.4  0.1 - 1.0 K/uL Final   Eosinophils Relative 05/29/2022 1  %  Final   Eosinophils Absolute 05/29/2022 0.2  0.0 - 0.5 K/uL Final   Basophils Relative 05/29/2022 0  % Final   Basophils Absolute 05/29/2022 0.1  0.0 - 0.1 K/uL Final   Immature Granulocytes 05/29/2022 1  % Final   Abs Immature Granulocytes 05/29/2022 0.12 (H)  0.00 - 0.07 K/uL Final   Performed at [facility]: [facility], [address], [place], [place] [address]   Sodium 05/29/2022 145  135 - 145 mmol/L Final   Potassium 05/29/2022 3.9  3.5 - 5.1 mmol/L Final   Chloride  05/29/2022 114 (H)  98 - 111 mmol/L Final   CO2 05/29/2022 22  22 - 32 mmol/L Final   Glucose, Bld 05/29/2022 100 (H)  70 - 99 mg/dL Final   Glucose reference range applies only to samples taken after fasting for at least 8 hours.   BUN 05/29/2022 23  8 - 23 mg/dL Final   Creatinine, Ser 05/29/2022 1.33 (H)  0.44 - 1.00 mg/dL Final   Calcium 19/14/7829 8.6 (L)  8.9 - 10.3 mg/dL Final   Total Protein 56/21/3086 7.0  6.5 - 8.1 g/dL Final   Albumin 57/84/6962 4.2  3.5 - 5.0 g/dL Final   AST 95/28/4132 18  15 - 41 U/L Final   ALT 05/29/2022 19  0 - 44 U/L Final   Alkaline Phosphatase 05/29/2022 70  38 - 126 U/L Final   Total Bilirubin 05/29/2022 0.7  0.3 - 1.2 mg/dL Final   GFR, Estimated 05/29/2022 38 (L)  >60 mL/min Final   Comment: (NOTE) Calculated using the CKD-EPI Creatinine Equation (2021)    Anion gap 05/29/2022 9  5 - 15 Final   Performed at [facility], [address] [place], [place] [address]    RADIOGRAPHIC STUDIES: I have personally reviewed the radiological images as listed and agree with the findings in the report  No results found.  ASSESSMENT/PLAN  89 y.o. female is here because of anemia.  Medical history notable for collagenous colitis treated with budesinolide, diverticulitis, GERD, gout, hyperlipidemia, hypertension, thyroid disease, cardiac arrhythmia, chronic kidney disease  Anemia:  Multifactorial with possible culprits being 1) Iron deficiency from occult GI blood likely due to colitis 2) CKD 3) Chronic inflammation  February 27 2022- Feraheme 510 mg  March 06 2022- Feraheme 510 mg  May 29 2022- Colitis is followed by GI and was last seen there in January 2024.  Decisions regarding endoscopy per their discretion as patient is frail elderly  Tolerating oral iron and B12 daily.  Hgb 12.7     Cancer Staging  No matching staging information was found for the patient.   No problem-specific Assessment & Plan notes found for this  encounter.   Orders Placed This Encounter  Procedures   CBC with Differential/Platelet   Ferritin    Standing Status:   Future    Standing Expiration Date:   05/29/2023   Vitamin B12    Standing Status:   Future    Standing Expiration Date:   05/29/2023   Folate    Standing Status:   Future    Standing Expiration Date:   05/29/2023   Comprehensive metabolic panel   Sedimentation rate    Standing Status:   Future    Standing Expiration Date:   05/29/2023   20  minutes was spent in patient care.  This included time spent preparing to see the patient (e.g., review of tests), obtaining and/or reviewing separately obtained history, counseling  and educating the patient/family/caregiver, ordering medications, tests, or procedures; documenting clinical information in the electronic or other health record, independently interpreting results and communicating results to the patient/family/caregiver as well as coordination of care.      All questions were answered. The patient knows to call the clinic with any problems, questions or concerns.  This note was electronically signed.    Loni Muse, MD  06/03/2022 1:06 PM

## 2022-05-29 ENCOUNTER — Other Ambulatory Visit: Payer: Self-pay

## 2022-05-29 ENCOUNTER — Inpatient Hospital Stay: Payer: Medicare PPO | Admitting: Oncology

## 2022-05-29 ENCOUNTER — Inpatient Hospital Stay: Payer: Medicare PPO | Attending: Oncology

## 2022-05-29 VITALS — BP 147/80 | HR 74 | Temp 97.5°F | Resp 17 | Ht 63.0 in | Wt 143.1 lb

## 2022-05-29 DIAGNOSIS — R0609 Other forms of dyspnea: Secondary | ICD-10-CM | POA: Diagnosis not present

## 2022-05-29 DIAGNOSIS — E785 Hyperlipidemia, unspecified: Secondary | ICD-10-CM | POA: Insufficient documentation

## 2022-05-29 DIAGNOSIS — Z7902 Long term (current) use of antithrombotics/antiplatelets: Secondary | ICD-10-CM | POA: Diagnosis not present

## 2022-05-29 DIAGNOSIS — K52832 Lymphocytic colitis: Secondary | ICD-10-CM

## 2022-05-29 DIAGNOSIS — D649 Anemia, unspecified: Secondary | ICD-10-CM | POA: Insufficient documentation

## 2022-05-29 DIAGNOSIS — K219 Gastro-esophageal reflux disease without esophagitis: Secondary | ICD-10-CM | POA: Diagnosis not present

## 2022-05-29 DIAGNOSIS — I129 Hypertensive chronic kidney disease with stage 1 through stage 4 chronic kidney disease, or unspecified chronic kidney disease: Secondary | ICD-10-CM | POA: Diagnosis not present

## 2022-05-29 DIAGNOSIS — R5383 Other fatigue: Secondary | ICD-10-CM | POA: Diagnosis not present

## 2022-05-29 DIAGNOSIS — N189 Chronic kidney disease, unspecified: Secondary | ICD-10-CM | POA: Insufficient documentation

## 2022-05-29 DIAGNOSIS — R54 Age-related physical debility: Secondary | ICD-10-CM | POA: Diagnosis not present

## 2022-05-29 DIAGNOSIS — D500 Iron deficiency anemia secondary to blood loss (chronic): Secondary | ICD-10-CM | POA: Diagnosis not present

## 2022-05-29 DIAGNOSIS — Z7982 Long term (current) use of aspirin: Secondary | ICD-10-CM | POA: Diagnosis not present

## 2022-05-29 DIAGNOSIS — N1832 Chronic kidney disease, stage 3b: Secondary | ICD-10-CM | POA: Diagnosis not present

## 2022-05-29 DIAGNOSIS — Z7952 Long term (current) use of systemic steroids: Secondary | ICD-10-CM | POA: Insufficient documentation

## 2022-05-29 DIAGNOSIS — Z79899 Other long term (current) drug therapy: Secondary | ICD-10-CM | POA: Insufficient documentation

## 2022-05-29 LAB — CBC WITH DIFFERENTIAL/PLATELET
Abs Immature Granulocytes: 0.12 10*3/uL — ABNORMAL HIGH (ref 0.00–0.07)
Basophils Absolute: 0.1 10*3/uL (ref 0.0–0.1)
Basophils Relative: 0 %
Eosinophils Absolute: 0.2 10*3/uL (ref 0.0–0.5)
Eosinophils Relative: 1 %
HCT: 39 % (ref 36.0–46.0)
Hemoglobin: 12.7 g/dL (ref 12.0–15.0)
Immature Granulocytes: 1 %
Lymphocytes Relative: 12 %
Lymphs Abs: 1.7 10*3/uL (ref 0.7–4.0)
MCH: 29.9 pg (ref 26.0–34.0)
MCHC: 32.6 g/dL (ref 30.0–36.0)
MCV: 91.8 fL (ref 80.0–100.0)
Monocytes Absolute: 0.4 10*3/uL (ref 0.1–1.0)
Monocytes Relative: 3 %
Neutro Abs: 11.5 10*3/uL — ABNORMAL HIGH (ref 1.7–7.7)
Neutrophils Relative %: 83 %
Platelets: 224 10*3/uL (ref 150–400)
RBC: 4.25 MIL/uL (ref 3.87–5.11)
RDW: 17.2 % — ABNORMAL HIGH (ref 11.5–15.5)
WBC: 13.9 10*3/uL — ABNORMAL HIGH (ref 4.0–10.5)
nRBC: 0 % (ref 0.0–0.2)

## 2022-05-29 LAB — COMPREHENSIVE METABOLIC PANEL
ALT: 19 U/L (ref 0–44)
AST: 18 U/L (ref 15–41)
Albumin: 4.2 g/dL (ref 3.5–5.0)
Alkaline Phosphatase: 70 U/L (ref 38–126)
Anion gap: 9 (ref 5–15)
BUN: 23 mg/dL (ref 8–23)
CO2: 22 mmol/L (ref 22–32)
Calcium: 8.6 mg/dL — ABNORMAL LOW (ref 8.9–10.3)
Chloride: 114 mmol/L — ABNORMAL HIGH (ref 98–111)
Creatinine, Ser: 1.33 mg/dL — ABNORMAL HIGH (ref 0.44–1.00)
GFR, Estimated: 38 mL/min — ABNORMAL LOW (ref >60)
Glucose, Bld: 100 mg/dL — ABNORMAL HIGH (ref 70–99)
Potassium: 3.9 mmol/L (ref 3.5–5.1)
Sodium: 145 mmol/L (ref 135–145)
Total Bilirubin: 0.7 mg/dL (ref 0.3–1.2)
Total Protein: 7 g/dL (ref 6.5–8.1)

## 2022-06-03 ENCOUNTER — Encounter: Payer: Self-pay | Admitting: Oncology

## 2022-06-15 DIAGNOSIS — H353221 Exudative age-related macular degeneration, left eye, with active choroidal neovascularization: Secondary | ICD-10-CM | POA: Diagnosis not present

## 2022-06-15 DIAGNOSIS — H35363 Drusen (degenerative) of macula, bilateral: Secondary | ICD-10-CM | POA: Diagnosis not present

## 2022-06-15 DIAGNOSIS — H35453 Secondary pigmentary degeneration, bilateral: Secondary | ICD-10-CM | POA: Diagnosis not present

## 2022-06-15 DIAGNOSIS — H43813 Vitreous degeneration, bilateral: Secondary | ICD-10-CM | POA: Diagnosis not present

## 2022-06-15 DIAGNOSIS — Z961 Presence of intraocular lens: Secondary | ICD-10-CM | POA: Diagnosis not present

## 2022-06-15 DIAGNOSIS — H353114 Nonexudative age-related macular degeneration, right eye, advanced atrophic with subfoveal involvement: Secondary | ICD-10-CM | POA: Diagnosis not present

## 2022-06-30 DIAGNOSIS — H353221 Exudative age-related macular degeneration, left eye, with active choroidal neovascularization: Secondary | ICD-10-CM | POA: Diagnosis not present

## 2022-07-10 DIAGNOSIS — H353114 Nonexudative age-related macular degeneration, right eye, advanced atrophic with subfoveal involvement: Secondary | ICD-10-CM | POA: Diagnosis not present

## 2022-08-12 ENCOUNTER — Encounter: Payer: Self-pay | Admitting: Podiatry

## 2022-08-12 ENCOUNTER — Ambulatory Visit: Payer: Medicare PPO | Admitting: Podiatry

## 2022-08-12 DIAGNOSIS — M79674 Pain in right toe(s): Secondary | ICD-10-CM

## 2022-08-12 DIAGNOSIS — B351 Tinea unguium: Secondary | ICD-10-CM | POA: Diagnosis not present

## 2022-08-12 DIAGNOSIS — I739 Peripheral vascular disease, unspecified: Secondary | ICD-10-CM | POA: Diagnosis not present

## 2022-08-12 DIAGNOSIS — M79675 Pain in left toe(s): Secondary | ICD-10-CM | POA: Diagnosis not present

## 2022-08-12 NOTE — Progress Notes (Signed)
  Subjective:  Patient ID: ANIAS CUTCHIN, female    DOB: [birth year],   MRN: [id number]  No chief complaint on file.   89 y.o. female presents for concern of thickened elongated and painful nails that are difficult to trim. Requesting to have them trimmed today. Patient with history of PAD and at risk for foot care.   PCP:  Clovis Riley, L.August Saucer, MD    . Denies any other pedal complaints. Denies n/v/f/c.   Past Medical History:  Diagnosis Date   Abnormal EKG    Collagenous colitis    Collagenous colitis    Diverticulitis    Diverticulosis    Fluttering heart    GERD (gastroesophageal reflux disease)    Gout    Gout attack 09/28/2012   Hammer toe of left foot 09/28/2012   Hyperlipidemia    Hypertension    Hypertensive emergency    Hypothyroidism    Irregular heart beat    Pain in joint, ankle and foot 09/28/2012   Status post foot surgery 10/11/2012   Thyroid disease     Objective:  Physical Exam: Vascular: DP/PT pulses 1/4 bilateral. CFT <3 seconds. Normal hair growth on digits. No edema.  Skin. No lacerations or abrasions bilateral feet. Nails 1-5 bilateral thickened elongated and with subungual debris.  Musculoskeletal: MMT 5/5 bilateral lower extremities in DF, PF, Inversion and Eversion. Deceased ROM in DF of ankle joint.  Neurological: Sensation intact to light touch.   Assessment:   1. Pain due to onychomycosis of toenails of both feet   2. PAD (peripheral artery disease) (HCC)      Plan:  Patient was evaluated and treated and all questions answered. -Mechanically debrided all nails 1-5 bilateral using sterile nail nipper and filed with dremel without incident  -Answered all patient questions -Patient to return  in 3 months for at risk foot care -Patient advised to call the office if any problems or questions arise in the meantime.   Louann Sjogren, DPM

## 2022-08-19 DIAGNOSIS — I10 Essential (primary) hypertension: Secondary | ICD-10-CM | POA: Diagnosis not present

## 2022-08-19 DIAGNOSIS — D692 Other nonthrombocytopenic purpura: Secondary | ICD-10-CM | POA: Diagnosis not present

## 2022-08-19 DIAGNOSIS — E039 Hypothyroidism, unspecified: Secondary | ICD-10-CM | POA: Diagnosis not present

## 2022-08-19 DIAGNOSIS — M4800 Spinal stenosis, site unspecified: Secondary | ICD-10-CM | POA: Diagnosis not present

## 2022-08-19 DIAGNOSIS — M48062 Spinal stenosis, lumbar region with neurogenic claudication: Secondary | ICD-10-CM | POA: Diagnosis not present

## 2022-08-19 DIAGNOSIS — D649 Anemia, unspecified: Secondary | ICD-10-CM | POA: Diagnosis not present

## 2022-08-19 DIAGNOSIS — N183 Chronic kidney disease, stage 3 unspecified: Secondary | ICD-10-CM | POA: Diagnosis not present

## 2022-08-19 DIAGNOSIS — R011 Cardiac murmur, unspecified: Secondary | ICD-10-CM | POA: Diagnosis not present

## 2022-08-19 DIAGNOSIS — E7800 Pure hypercholesterolemia, unspecified: Secondary | ICD-10-CM | POA: Diagnosis not present

## 2022-08-25 DIAGNOSIS — H353221 Exudative age-related macular degeneration, left eye, with active choroidal neovascularization: Secondary | ICD-10-CM | POA: Diagnosis not present

## 2022-08-27 ENCOUNTER — Other Ambulatory Visit: Payer: Self-pay | Admitting: Oncology

## 2022-08-27 DIAGNOSIS — D500 Iron deficiency anemia secondary to blood loss (chronic): Secondary | ICD-10-CM

## 2022-08-27 NOTE — Progress Notes (Signed)
[facility] Cancer Follow up Visit:  Patient Care Team: Clovis Riley, L.August Saucer, MD as PCP - General (Family Medicine) Jodelle Red, MD as PCP - Cardiology (Cardiology)  CHIEF COMPLAINTS/PURPOSE OF CONSULTATION:   HISTORY OF PRESENTING ILLNESS: Sharon Bailey 89 y.o. female is here because of anemia  Medical history notable for collagenous colitis treated with budesinolide, diverticulitis, GERD, gout, hyperlipidemia, hypertension, thyroid disease, cardiac arrhythmia, chronic kidney disease  December 20 2014:  Colonoscopy.  Severe diverticulosis in sigmoid colon  February 12, 2022: WBC 9.4 hemoglobin 7.3 MCV 79 platelet count 221; 82 segs 11 lymphs 4 monos 3 eos Ferritin 33   Chemistries notable for creatinine 1.18  February 21 2022:  [facility]  Colitis overall under good control with budesinolide.   Has been taking oral iron for the past week.  No history of IV iron nor required PRBC's in the past.  Has tolerated oral iron.  Has a regular diet.  No history of postpartum hemorrhage requiring transfusion.   No history of hemorrhage postoperatively requiring transfusion.  No hematochezia, melena, hemoptysis, hematuria.   No history of intra-articular or soft tissue bleeding.  Uses ibuprofen about 2 to 3 times a week for pain.  Is not taking oral anticoagulants but does take ASA daily as an antiplatelet drugs.  No history of abnormal bleeding in family members.  Patient has symptoms of fatigue, pallor,  DOE, intolerance to cold, decreased performance status.  Patient has pica to ice but not starch/dirt.  Has never undergone an EGD.    March 23, 2022: WBC 13.2 hemoglobin 11.4 platelet count 208; 73 segs 13 lymphs 5 monos 7 eos 1 basophil reticulocyte count 2.5% Coombs test negative  Haptoglobin 136 SPEP with IEP showed no paraprotein.  Serum free kappa 53.1 lambda 27.9 with a kappa lambda 1.90 IgG 860 IgA 184 IgM 95 INR 1.1 PTT 26  Ferritin 437 folate  30.4 B12 203 zinc 23. CMP notable for potassium 3.4 creatinine 1.1 glucose 108  May 29 2022:  .  Remains on oral iron and vitamin B12 daily which she is tolerating well.  Occasional flare of colitis which can last a day.  She is followed by GI and was last seen there in January 2024.  Experiencing generalized itching helped briefly by lotions.    WBC 13.9 hemoglobin 12.7 MCV 92 platelet count 224.  ANC 11.5 CMP notable for creatinine 1.33 glucose 100 calcium 8.6 albumin 4.2  August 28 2022:  Scheduled follow up for anemia.  Feels well.  Taking oral iron and B12 daily.  Colitis flares up occasionally.  No gout attacks.    WBC 10.3 hemoglobin 11.7 MCV 93 platelet count 171; 65 segs 21 lymphs 7 monos 5 eos 1 basophil   Review of Systems - Oncology  MEDICAL HISTORY: Past Medical History:  Diagnosis Date   Abnormal EKG    Collagenous colitis    Collagenous colitis    Diverticulitis    Diverticulosis    Fluttering heart    GERD (gastroesophageal reflux disease)    Gout    Gout attack 09/28/2012   Hammer toe of left foot 09/28/2012   Hyperlipidemia    Hypertension    Hypertensive emergency    Hypothyroidism    Irregular heart beat    Pain in joint, ankle and foot 09/28/2012   Status post foot surgery 10/11/2012   Thyroid disease     SURGICAL HISTORY: Past Surgical History:  Procedure Laterality Date   ABDOMINAL HYSTERECTOMY  ADENOIDECTOMY     CHOLECYSTECTOMY     Excision Ganglion Toe Left 09/29/2012   Lt #2 @ [facility]   Hammer toe repair Left 09/29/2012   Lt #2 @ [facility]   TONSILLECTOMY      SOCIAL HISTORY: Social History   Socioeconomic History   Marital status: Married    Spouse name: Not on file   Number of children: 3   Years of education: 12   Highest education level: 12th grade  Occupational History   Not on file  Tobacco Use   Smoking status: Never   Smokeless tobacco: Never  Vaping Use   Vaping status: Never Used  Substance and Sexual Activity   Alcohol use:  No    Alcohol/week: 0.0 standard drinks of alcohol   Drug use: No   Sexual activity: Not Currently  Other Topics Concern   Not on file  Social History Narrative   Not on file   Social Determinants of Health   Financial Resource Strain: Not on file  Food Insecurity: No Food Insecurity (02/21/2022)   Hunger Vital Sign    Worried About Running Out of Food in the Last Year: Never true    Ran Out of Food in the Last Year: Never true  Transportation Needs: No Transportation Needs (02/21/2022)   PRAPARE - Administrator, Civil Service (Medical): No    Lack of Transportation (Non-Medical): No  Physical Activity: Not on file  Stress: Not on file  Social Connections: Not on file  Intimate Partner Violence: Not At Risk (02/21/2022)   Humiliation, Afraid, Rape, and Kick questionnaire    Fear of Current or Ex-Partner: No    Emotionally Abused: No    Physically Abused: No    Sexually Abused: No    FAMILY HISTORY Family History  Problem Relation Age of Onset   Breast cancer Mother    Memory loss Father    Heart disease Sister    Rheumatic fever Sister    Lung disease Brother    Colon cancer Neg Hx     ALLERGIES:  is allergic to ciprofloxacin and sulfamethoxazole-trimethoprim.  MEDICATIONS:  Current Outpatient Medications  Medication Sig Dispense Refill   allopurinol (ZYLOPRIM) 300 MG tablet Take 300 mg by mouth daily.     amLODipine (NORVASC) 5 MG tablet Take 1 tablet (5 mg total) by mouth daily. 90 tablet 3   budesonide (ENTOCORT EC) 3 MG 24 hr capsule TAKE 1 CAPSULE EVERY DAY AS DIRECTED 90 capsule 1   ferrous sulfate 325 (65 FE) MG EC tablet Take 325 mg by mouth daily with breakfast.     levothyroxine (SYNTHROID) 88 MCG tablet Take 88 mcg by mouth daily before breakfast.     simvastatin (ZOCOR) 40 MG tablet Take 40 mg by mouth daily.     valsartan (DIOVAN) 160 MG tablet Take 1 tablet (160 mg total) by mouth daily. 90 tablet 3   vitamin B-12 1000 MCG tablet Take 1  tablet (1,000 mcg total) by mouth daily.     No current facility-administered medications for this visit.    PHYSICAL EXAMINATION:  ECOG PERFORMANCE STATUS: 1 - Symptomatic but completely ambulatory   There were no vitals filed for this visit.   There were no vitals filed for this visit.    Physical Exam Vitals and nursing note reviewed.  Constitutional:      Appearance: Normal appearance. She is not ill-appearing, toxic-appearing or diaphoretic.     Comments: Here with daughter  HENT:  Head: Normocephalic and atraumatic.     Right Ear: External ear normal.     Left Ear: External ear normal.     Nose: Nose normal. No congestion or rhinorrhea.  Eyes:     General: No scleral icterus.    Extraocular Movements: Extraocular movements intact.     Conjunctiva/sclera: Conjunctivae normal.     Pupils: Pupils are equal, round, and reactive to light.  Cardiovascular:     Rate and Rhythm: Normal rate.     Heart sounds: No murmur heard.    No friction rub. No gallop.  Abdominal:     General: Bowel sounds are normal.     Palpations: Abdomen is soft.  Musculoskeletal:        General: No swelling, tenderness or deformity.     Cervical back: Normal range of motion and neck supple. No rigidity or tenderness.  Lymphadenopathy:     Head:     Right side of head: No submental, submandibular, tonsillar, preauricular, posterior auricular or occipital adenopathy.     Left side of head: No submental, submandibular, tonsillar, preauricular, posterior auricular or occipital adenopathy.     Cervical: No cervical adenopathy.     Right cervical: No superficial, deep or posterior cervical adenopathy.    Left cervical: No superficial, deep or posterior cervical adenopathy.     Upper Body:     Right upper body: No supraclavicular, axillary, pectoral or epitrochlear adenopathy.     Left upper body: No supraclavicular, axillary, pectoral or epitrochlear adenopathy.  Skin:    General: Skin is  warm.     Coloration: Skin is not jaundiced.     Findings: Bruising present.     Comments: Senile purpura on extensor surfaces Also has some bruising on left neck  Neurological:     General: No focal deficit present.     Mental Status: She is alert and oriented to person, place, and time.     Cranial Nerves: Cranial nerve deficit present.     Gait: Gait abnormal.     Comments: Has hearing aids.  Ambulates with a cane  Psychiatric:        Mood and Affect: Mood normal.        Behavior: Behavior normal.        Thought Content: Thought content normal.        Judgment: Judgment normal.    LABORATORY DATA: I have personally reviewed the data as listed:  No visits with results within 1 Month(s) from this visit.  Latest known visit with results is:  Office Visit on 05/29/2022  Component Date Value Ref Range Status   WBC 05/29/2022 13.9 (H)  4.0 - 10.5 K/uL Final   RBC 05/29/2022 4.25  3.87 - 5.11 MIL/uL Final   Hemoglobin 05/29/2022 12.7  12.0 - 15.0 g/dL Final   HCT 52/84/1324 39.0  36.0 - 46.0 % Final   MCV 05/29/2022 91.8  80.0 - 100.0 fL Final   MCH 05/29/2022 29.9  26.0 - 34.0 pg Final   MCHC 05/29/2022 32.6  30.0 - 36.0 g/dL Final   RDW 40/10/2723 17.2 (H)  11.5 - 15.5 % Final   Platelets 05/29/2022 224  150 - 400 K/uL Final   nRBC 05/29/2022 0.0  0.0 - 0.2 % Final   Neutrophils Relative % 05/29/2022 83  % Final   Neutro Abs 05/29/2022 11.5 (H)  1.7 - 7.7 K/uL Final   Lymphocytes Relative 05/29/2022 12  % Final   Lymphs Abs 05/29/2022 1.7  0.7 - 4.0 K/uL Final   Monocytes Relative 05/29/2022 3  % Final   Monocytes Absolute 05/29/2022 0.4  0.1 - 1.0 K/uL Final   Eosinophils Relative 05/29/2022 1  % Final   Eosinophils Absolute 05/29/2022 0.2  0.0 - 0.5 K/uL Final   Basophils Relative 05/29/2022 0  % Final   Basophils Absolute 05/29/2022 0.1  0.0 - 0.1 K/uL Final   Immature Granulocytes 05/29/2022 1  % Final   Abs Immature Granulocytes 05/29/2022 0.12 (H)  0.00 - 0.07 K/uL  Final   Performed at [facility], [address], [place], [place] [address]   Sodium 05/29/2022 145  135 - 145 mmol/L Final   Potassium 05/29/2022 3.9  3.5 - 5.1 mmol/L Final   Chloride 05/29/2022 114 (H)  98 - 111 mmol/L Final   CO2 05/29/2022 22  22 - 32 mmol/L Final   Glucose, Bld 05/29/2022 100 (H)  70 - 99 mg/dL Final   Glucose reference range applies only to samples taken after fasting for at least 8 hours.   BUN 05/29/2022 23  8 - 23 mg/dL Final   Creatinine, Ser 05/29/2022 1.33 (H)  0.44 - 1.00 mg/dL Final   Calcium 82/95/6213 8.6 (L)  8.9 - 10.3 mg/dL Final   Total Protein 08/65/7846 7.0  6.5 - 8.1 g/dL Final   Albumin 96/29/5284 4.2  3.5 - 5.0 g/dL Final   AST 13/24/4010 18  15 - 41 U/L Final   ALT 05/29/2022 19  0 - 44 U/L Final   Alkaline Phosphatase 05/29/2022 70  38 - 126 U/L Final   Total Bilirubin 05/29/2022 0.7  0.3 - 1.2 mg/dL Final   GFR, Estimated 05/29/2022 38 (L)  >60 mL/min Final   Comment: (NOTE) Calculated using the CKD-EPI Creatinine Equation (2021)    Anion gap 05/29/2022 9  5 - 15 Final   Performed at [facility], [address], [place], [place] [address]    RADIOGRAPHIC STUDIES: I have personally reviewed the radiological images as listed and agree with the findings in the report  No results found.  ASSESSMENT/PLAN  89 y.o. female is here because of anemia.  Medical history notable for collagenous colitis treated with budesinolide, diverticulitis, GERD, gout, hyperlipidemia, hypertension, thyroid disease, cardiac arrhythmia, chronic kidney disease  Anemia:  Multifactorial with possible culprits being 1) Iron deficiency from occult GI blood likely due to colitis 2) CKD 3) Chronic inflammation  February 27 2022- Feraheme 510 mg  March 06 2022- Feraheme 510 mg  May 29 2022- Colitis is followed by GI and was last seen there in January 2024.  Decisions regarding endoscopy per their discretion as patient  is frail elderly  Tolerating oral iron and B12 daily.  Hgb 12.7    August 28 2022:  Hgb 11.7 Ferritin 185 B12 856 Folate 31.6.  Continue MVI with iron.  Follow up in 3 months.  If stable can consider PRN follow up   Cancer Staging  No matching staging information was found for the patient.    No problem-specific Assessment & Plan notes found for this encounter.   No orders of the defined types were placed in this encounter.  20  minutes was spent in patient care.  This included time spent preparing to see the patient (e.g., review of tests), obtaining and/or reviewing separately obtained history, counseling and educating the patient/family/caregiver, ordering medications, tests, or procedures; documenting clinical information in the electronic or other health record, independently interpreting  results and communicating results to the patient/family/caregiver as well as coordination of care.      All questions were answered. The patient knows to call the clinic with any problems, questions or concerns.  This note was electronically signed.    Loni Muse, MD  08/27/2022 4:19 PM

## 2022-08-28 ENCOUNTER — Inpatient Hospital Stay: Payer: Medicare PPO | Admitting: Oncology

## 2022-08-28 ENCOUNTER — Inpatient Hospital Stay: Payer: Medicare PPO | Attending: Oncology

## 2022-08-28 VITALS — BP 136/78 | HR 74 | Temp 98.1°F | Resp 18 | Ht 63.0 in | Wt 142.6 lb

## 2022-08-28 DIAGNOSIS — D649 Anemia, unspecified: Secondary | ICD-10-CM | POA: Insufficient documentation

## 2022-08-28 DIAGNOSIS — D500 Iron deficiency anemia secondary to blood loss (chronic): Secondary | ICD-10-CM | POA: Diagnosis not present

## 2022-08-28 DIAGNOSIS — K52832 Lymphocytic colitis: Secondary | ICD-10-CM | POA: Diagnosis not present

## 2022-08-28 DIAGNOSIS — R54 Age-related physical debility: Secondary | ICD-10-CM | POA: Diagnosis not present

## 2022-08-28 LAB — CBC WITH DIFFERENTIAL (CANCER CENTER ONLY)
Abs Immature Granulocytes: 0.06 10*3/uL (ref 0.00–0.07)
Basophils Absolute: 0.1 10*3/uL (ref 0.0–0.1)
Basophils Relative: 1 %
Eosinophils Absolute: 0.5 10*3/uL (ref 0.0–0.5)
Eosinophils Relative: 5 %
HCT: 35.2 % — ABNORMAL LOW (ref 36.0–46.0)
Hemoglobin: 11.7 g/dL — ABNORMAL LOW (ref 12.0–15.0)
Immature Granulocytes: 1 %
Lymphocytes Relative: 21 %
Lymphs Abs: 2.2 10*3/uL (ref 0.7–4.0)
MCH: 31 pg (ref 26.0–34.0)
MCHC: 33.2 g/dL (ref 30.0–36.0)
MCV: 93.4 fL (ref 80.0–100.0)
Monocytes Absolute: 0.7 10*3/uL (ref 0.1–1.0)
Monocytes Relative: 7 %
Neutro Abs: 6.8 10*3/uL (ref 1.7–7.7)
Neutrophils Relative %: 65 %
Platelet Count: 171 10*3/uL (ref 150–400)
RBC: 3.77 MIL/uL — ABNORMAL LOW (ref 3.87–5.11)
RDW: 14.1 % (ref 11.5–15.5)
WBC Count: 10.3 10*3/uL (ref 4.0–10.5)
nRBC: 0 % (ref 0.0–0.2)

## 2022-08-28 LAB — FOLATE: Folate: 31.6 ng/mL (ref >5.9)

## 2022-08-28 LAB — FERRITIN: Ferritin: 185 ng/mL (ref 11–307)

## 2022-08-28 LAB — SEDIMENTATION RATE: Sed Rate: 15 mm/hr (ref 0–22)

## 2022-08-28 LAB — VITAMIN B12: Vitamin B-12: 856 pg/mL (ref 180–914)

## 2022-08-31 DIAGNOSIS — H353114 Nonexudative age-related macular degeneration, right eye, advanced atrophic with subfoveal involvement: Secondary | ICD-10-CM | POA: Diagnosis not present

## 2022-09-21 ENCOUNTER — Encounter: Payer: Self-pay | Admitting: Oncology

## 2022-10-07 ENCOUNTER — Telehealth: Payer: Self-pay | Admitting: Oncology

## 2022-10-07 NOTE — Telephone Encounter (Signed)
Patient called to reschedule October appointment, appointments have been rescheduled. Patient is notified.

## 2022-10-20 ENCOUNTER — Encounter (HOSPITAL_BASED_OUTPATIENT_CLINIC_OR_DEPARTMENT_OTHER): Payer: Self-pay | Admitting: Cardiology

## 2022-10-20 ENCOUNTER — Ambulatory Visit (HOSPITAL_BASED_OUTPATIENT_CLINIC_OR_DEPARTMENT_OTHER): Payer: Medicare PPO | Admitting: Cardiology

## 2022-10-20 VITALS — BP 136/70 | HR 84 | Ht 63.0 in | Wt 154.7 lb

## 2022-10-20 DIAGNOSIS — I119 Hypertensive heart disease without heart failure: Secondary | ICD-10-CM | POA: Diagnosis not present

## 2022-10-20 DIAGNOSIS — I872 Venous insufficiency (chronic) (peripheral): Secondary | ICD-10-CM | POA: Diagnosis not present

## 2022-10-20 DIAGNOSIS — R5382 Chronic fatigue, unspecified: Secondary | ICD-10-CM | POA: Diagnosis not present

## 2022-10-20 DIAGNOSIS — I10 Essential (primary) hypertension: Secondary | ICD-10-CM | POA: Diagnosis not present

## 2022-10-20 DIAGNOSIS — Z8673 Personal history of transient ischemic attack (TIA), and cerebral infarction without residual deficits: Secondary | ICD-10-CM | POA: Diagnosis not present

## 2022-10-20 NOTE — Patient Instructions (Signed)
Medication Instructions:  Your physician recommends that you continue on your current medications as directed. Please refer to the Current Medication list given to you today.  Follow-Up: At [facility], you and your health needs are our priority.  As part of our continuing mission to provide you with exceptional heart care, we have created designated Provider Care Teams.  These Care Teams include your primary Cardiologist (physician) and Advanced Practice Providers (APPs -  Physician Assistants and Nurse Practitioners) who all work together to provide you with the care you need, when you need it.  We recommend signing up for the patient portal called "MyChart".  Sign up information is provided on this After Visit Summary.  MyChart is used to connect with patients for Virtual Visits (Telemedicine).  Patients are able to view lab/test results, encounter notes, upcoming appointments, etc.  Non-urgent messages can be sent to your provider as well.   To learn more about what you can do with MyChart, go to [email or web address].    Your next appointment:   1 year with Dr. Cristal Deer

## 2022-10-20 NOTE — Progress Notes (Signed)
Cardiology Office Note:  .    Date:  10/20/2022  ID:  Sharon Bailey, DOB 1930/11/18, MRN [id number] PCP: Asencion Gowda.August Saucer, MD  [facility] Providers Cardiologist:  Jodelle Red, MD     History of Present Illness: .    Sharon Bailey is a 89 y.o. female with a hx of hypertension, hyperlipidemia, history of CVA and gout. I first met her 08/26/18.   Of note, she usually gets anxious and has higher blood pressure readings in clinic. At home her blood pressure was averaging 145/75. She had not seen very low readings.   At her visit 10/2021, she complained of aggravating back pain. She also noted becoming fatigued easily requiring her to sit down, but felt that she was coping well. She had occasional LE swelling R>L. She struggled with nocturia and therefore never felt well rested. Home blood pressures averaged around 145/70, occasionally as high as 178 systolic. We discussed chlorthalidone given her mild LE edema, but she did not wish to pursue.   Today, she is accompanied by a family member. She does not have any new cardiovascular concerns. However, she does state that she continues to have low energy levels and feels like she is slowing down. To stay active she is completing her housework and preparing her own meals, goes to church, grocery shopping. No anginal symptoms and her breathing has been stable.  By night time she is aware of some LE swelling. This usually improves in the mornings.  In the office her blood pressure is 136/70. She confirms that her home readings have been stable as well. She remains compliant and tolerates her medications.  She denies any palpitations, chest pain, shortness of breath, lightheadedness, headaches, syncope, orthopnea, or PND.  ROS:  Please see the history of present illness. ROS otherwise negative except as noted.  (+) Fatigue (+) Intermittent LE edema  Studies Reviewed: Marland Kitchen    EKG Interpretation Date/Time:  Tuesday October 20 2022  13:31:33 EDT Ventricular Rate:  78 PR Interval:  148 QRS Duration:  108 QT Interval:  388 QTC Calculation: 442 R Axis:   38  Text Interpretation: Normal sinus rhythm Minimal voltage criteria for LVH, may be normal variant Anterolateral infarct (cited on or before 22-Oct-2019) Confirmed by Jodelle Red [phone]) on 10/20/2022 1:36:49 PM    Physical Exam:    VS:  BP 136/70   Pulse 84   Ht 5\' 3"  (1.6 m)   Wt 154 lb 11.8 oz (70.2 kg)   SpO2 94%   BMI 27.41 kg/m    Wt Readings from Last 3 Encounters:  10/20/22 154 lb 11.8 oz (70.2 kg)  08/28/22 142 lb 9.6 oz (64.7 kg)  05/29/22 143 lb 1.6 oz (64.9 kg)    GEN: Well nourished, well developed in no acute distress HEENT: Normal, moist mucous membranes NECK: No JVD CARDIAC: regular rhythm, normal S1 and S2, no rubs or gallops. 2/6 systolic murmur. VASCULAR: Radial and DP pulses 2+ bilaterally. No carotid bruits RESPIRATORY:  Clear to auscultation without rales, wheezing or rhonchi  ABDOMEN: Soft, non-tender, non-distended MUSCULOSKELETAL:  Ambulates independently SKIN: Warm and dry, no edema NEUROLOGIC:  Alert and oriented x 3. No focal neuro deficits noted. PSYCHIATRIC:  Normal affect   ASSESSMENT AND PLAN: .    Hypertension:  -continue valsartan -continue amlodipine 5 mg daily -has had carvedilol stopped, unclear when/why. Could restart low dose if needed in the future -has said she does not want diuretic/anything that makes her urinate more  Chronic venous insufficiency -mild, stable -discussed compression, elevation   Chronic fatigue: -feels she is coping well, no changes -has had echo, monitor, and stress test (while having symptoms) that have not shown cardiac etiology   History of CVA Cardiac risk counseling and primary prevention recommendations -see prior discussion re: statins. Has tolerated simvastatin, cannot raise dose due to amlodipine interaction -counseled on red flag warning signs -last LDL 71.  Ideal goal <70 -on aspirin 81 mg daily  Dispo: Follow-up in 1 year, or sooner as needed.  I,Mathew Stumpf,acting as a Neurosurgeon for [employer], MD.,have documented all relevant documentation on the behalf of Jodelle Red, MD,as directed by  Jodelle Red, MD while in the presence of Jodelle Red, MD.  I, Jodelle Red, MD, have reviewed all documentation for this visit. The documentation on 10/20/22 for the exam, diagnosis, procedures, and orders are all accurate and complete.   Signed, Jodelle Red, MD

## 2022-10-21 DIAGNOSIS — H353221 Exudative age-related macular degeneration, left eye, with active choroidal neovascularization: Secondary | ICD-10-CM | POA: Diagnosis not present

## 2022-10-22 ENCOUNTER — Other Ambulatory Visit (HOSPITAL_BASED_OUTPATIENT_CLINIC_OR_DEPARTMENT_OTHER): Payer: Self-pay | Admitting: Cardiology

## 2022-10-30 ENCOUNTER — Other Ambulatory Visit: Payer: Medicare PPO

## 2022-10-30 ENCOUNTER — Ambulatory Visit: Payer: Medicare PPO | Admitting: Oncology

## 2022-11-04 DIAGNOSIS — H353114 Nonexudative age-related macular degeneration, right eye, advanced atrophic with subfoveal involvement: Secondary | ICD-10-CM | POA: Diagnosis not present

## 2022-11-06 ENCOUNTER — Inpatient Hospital Stay: Payer: Medicare PPO | Admitting: Oncology

## 2022-11-06 ENCOUNTER — Other Ambulatory Visit: Payer: Self-pay | Admitting: Oncology

## 2022-11-06 ENCOUNTER — Inpatient Hospital Stay: Payer: Medicare PPO | Attending: Oncology

## 2022-11-06 VITALS — HR 78 | Temp 97.7°F | Resp 17 | Ht 63.0 in | Wt 143.5 lb

## 2022-11-06 DIAGNOSIS — N183 Chronic kidney disease, stage 3 unspecified: Secondary | ICD-10-CM | POA: Insufficient documentation

## 2022-11-06 DIAGNOSIS — D631 Anemia in chronic kidney disease: Secondary | ICD-10-CM | POA: Diagnosis not present

## 2022-11-06 DIAGNOSIS — D509 Iron deficiency anemia, unspecified: Secondary | ICD-10-CM | POA: Insufficient documentation

## 2022-11-06 DIAGNOSIS — K52831 Collagenous colitis: Secondary | ICD-10-CM | POA: Diagnosis not present

## 2022-11-06 DIAGNOSIS — D500 Iron deficiency anemia secondary to blood loss (chronic): Secondary | ICD-10-CM | POA: Diagnosis not present

## 2022-11-06 DIAGNOSIS — N1832 Chronic kidney disease, stage 3b: Secondary | ICD-10-CM | POA: Diagnosis not present

## 2022-11-06 LAB — IRON AND IRON BINDING CAPACITY (CC-WL,HP ONLY)
Iron: 46 ug/dL (ref 28–170)
Saturation Ratios: 17 % (ref 10.4–31.8)
TIBC: 274 ug/dL (ref 250–450)
UIBC: 228 ug/dL (ref 148–442)

## 2022-11-06 LAB — COMPREHENSIVE METABOLIC PANEL
ALT: 14 U/L (ref 0–44)
AST: 18 U/L (ref 15–41)
Albumin: 3.7 g/dL (ref 3.5–5.0)
Alkaline Phosphatase: 63 U/L (ref 38–126)
Anion gap: 7 (ref 5–15)
BUN: 23 mg/dL (ref 8–23)
CO2: 24 mmol/L (ref 22–32)
Calcium: 8.9 mg/dL (ref 8.9–10.3)
Chloride: 109 mmol/L (ref 98–111)
Creatinine, Ser: 1.25 mg/dL — ABNORMAL HIGH (ref 0.44–1.00)
GFR, Estimated: 41 mL/min — ABNORMAL LOW (ref >60)
Glucose, Bld: 116 mg/dL — ABNORMAL HIGH (ref 70–99)
Potassium: 3.8 mmol/L (ref 3.5–5.1)
Sodium: 140 mmol/L (ref 135–145)
Total Bilirubin: 0.7 mg/dL (ref 0.3–1.2)
Total Protein: 6.5 g/dL (ref 6.5–8.1)

## 2022-11-06 LAB — CBC WITH DIFFERENTIAL/PLATELET
Abs Immature Granulocytes: 0.03 10*3/uL (ref 0.00–0.07)
Basophils Absolute: 0.1 10*3/uL (ref 0.0–0.1)
Basophils Relative: 1 %
Eosinophils Absolute: 0.1 10*3/uL (ref 0.0–0.5)
Eosinophils Relative: 1 %
HCT: 34.3 % — ABNORMAL LOW (ref 36.0–46.0)
Hemoglobin: 11.2 g/dL — ABNORMAL LOW (ref 12.0–15.0)
Immature Granulocytes: 0 %
Lymphocytes Relative: 14 %
Lymphs Abs: 1.4 10*3/uL (ref 0.7–4.0)
MCH: 29.4 pg (ref 26.0–34.0)
MCHC: 32.7 g/dL (ref 30.0–36.0)
MCV: 90 fL (ref 80.0–100.0)
Monocytes Absolute: 0.4 10*3/uL (ref 0.1–1.0)
Monocytes Relative: 4 %
Neutro Abs: 8 10*3/uL — ABNORMAL HIGH (ref 1.7–7.7)
Neutrophils Relative %: 80 %
Platelets: 185 10*3/uL (ref 150–400)
RBC: 3.81 MIL/uL — ABNORMAL LOW (ref 3.87–5.11)
RDW: 14.6 % (ref 11.5–15.5)
WBC: 10 10*3/uL (ref 4.0–10.5)
nRBC: 0 % (ref 0.0–0.2)

## 2022-11-06 LAB — LACTATE DEHYDROGENASE: LDH: 190 U/L (ref 98–192)

## 2022-11-06 LAB — FERRITIN: Ferritin: 123 ng/mL (ref 11–307)

## 2022-11-06 LAB — FOLATE: Folate: 20.6 ng/mL (ref >5.9)

## 2022-11-06 LAB — VITAMIN B12: Vitamin B-12: 578 pg/mL (ref 180–914)

## 2022-11-06 NOTE — Assessment & Plan Note (Signed)
-  She is on budesonide.  No recent flare-ups. Occasional diarrhea during flare-ups. -Continue current management and monitor for flare-ups.

## 2022-11-06 NOTE — Assessment & Plan Note (Signed)
-   Multifactorial anemia from iron deficiency, anemia of chronic disease related to CKD.  Overall stable hemoglobin levels (11.2) compared to previous values. No signs of blood loss.  - Patient is currently on oral iron supplements with no side effects. Continue oral iron supplementation daily. -Iron studies today show no evidence of iron deficiency.  Ferritin pending.  Currently no indication for IV iron. -Renal function stable overall. -Plan for reevaluation in 6 months.

## 2022-11-06 NOTE — Progress Notes (Signed)
[facility]  HEMATOLOGY/ONCOLOGY PROGRESS NOTE  Patient Care Team: Clovis Riley, L.August Saucer, MD (Inactive) as PCP - General (Family Medicine) Jodelle Red, MD as PCP - Cardiology (Cardiology)  CHIEF COMPLAINT/ REASON FOR VISIT:  Follow-up for anemia.  HISTORY OF PRESENTING ILLNESS:   Sharon Bailey is a 89 y.o. lady who was transferred to my care after her prior physician has left.  She is being followed in our clinic for multifactorial anemia related to iron deficiency and anemia of chronic disease from CKD.  Past medical history is also notable for collagenous colitis treated with budesonide, diverticulitis, GERD, gout, hyperlipidemia, hypertension, thyroid disease, cardiac arrhythmia, chronic kidney disease.   I reviewed the patient's records extensively and collaborated the history with the patient. Summary of her history is as follows:  December 20 2014:  Colonoscopy.  Severe diverticulosis in sigmoid colon   February 12, 2022: WBC 9.4 hemoglobin 7.3 MCV 79 platelet count 221; 82 segs 11 lymphs 4 monos 3 eos Ferritin 33   Chemistries notable for creatinine 1.18   February 21 2022:  [facility]  Colitis overall under good control with budesinolide.   Has been taking oral iron for the past week.  No history of IV iron nor required PRBC's in the past.  Has tolerated oral iron.  Has a regular diet.  No history of postpartum hemorrhage requiring transfusion.   No history of hemorrhage postoperatively requiring transfusion.  No hematochezia, melena, hemoptysis, hematuria.   No history of intra-articular or soft tissue bleeding.  Uses ibuprofen about 2 to 3 times a week for pain.  Is not taking oral anticoagulants but does take ASA daily as an antiplatelet drugs.  No history of abnormal bleeding in family members.  Patient has symptoms of fatigue, pallor,  DOE, intolerance to cold, decreased performance status.  Patient has pica to ice but not  starch/dirt.  Has never undergone an EGD.     March 23, 2022: WBC 13.2 hemoglobin 11.4 platelet count 208; 73 segs 13 lymphs 5 monos 7 eos 1 basophil reticulocyte count 2.5% Coombs test negative  Haptoglobin 136 SPEP with IEP showed no paraprotein.  Serum free kappa 53.1 lambda 27.9 with a kappa lambda 1.90 IgG 860 IgA 184 IgM 95 INR 1.1 PTT 26   Ferritin 437 folate 30.4 B12 203 zinc 23. CMP notable for potassium 3.4 creatinine 1.1 glucose 108   May 29 2022: Remains on oral iron and vitamin B12 daily which she is tolerating well.  Occasional flare of colitis which can last a day.  She is followed by GI and was last seen there in January 2024.  Experiencing generalized itching helped briefly by lotions.     WBC 13.9 hemoglobin 12.7 MCV 92 platelet count 224.  ANC 11.5 CMP notable for creatinine 1.33 glucose 100 calcium 8.6 albumin 4.2   August 28 2022:  Scheduled follow up for anemia.  Feels well.  Taking oral iron and B12 daily.  Colitis flares up occasionally.  No gout attacks.     WBC 10.3 hemoglobin 11.7 MCV 93 platelet count 171; 65 segs 21 lymphs 7 monos 5 eos 1 basophil  INTERVAL HISTORY:   She received IV iron earlier this year, which improved her symptoms. She continues to take oral iron supplements daily without any side effects. She denies any signs of blood loss such as bloody or black stools, nosebleeds, or gum bleeds. However, she reports low energy levels, which she attributes to a severe lower back  problem that limits her mobility. She stays well-hydrated and has not had any major flare-ups of colitis recently. Her bowel movements are regular, but she occasionally experiences diarrhea when her colitis acts up.  MEDICAL HISTORY:  Past Medical History:  Diagnosis Date   Abnormal EKG    Collagenous colitis    Collagenous colitis    Diverticulitis    Diverticulosis    Fluttering heart    GERD (gastroesophageal reflux disease)    Gout    Gout attack 09/28/2012   Hammer  toe of left foot 09/28/2012   Hyperlipidemia    Hypertension    Hypertensive emergency    Hypothyroidism    Irregular heart beat    Pain in joint, ankle and foot 09/28/2012   Status post foot surgery 10/11/2012   Thyroid disease     SURGICAL HISTORY: Past Surgical History:  Procedure Laterality Date   ABDOMINAL HYSTERECTOMY     ADENOIDECTOMY     CHOLECYSTECTOMY     Excision Ganglion Toe Left 09/29/2012   Lt #2 @ [facility]   Hammer toe repair Left 09/29/2012   Lt #2 @ [facility]   TONSILLECTOMY      SOCIAL HISTORY: Social History   Socioeconomic History   Marital status: Married    Spouse name: Not on file   Number of children: 3   Years of education: 12   Highest education level: 12th grade  Occupational History   Not on file  Tobacco Use   Smoking status: Never   Smokeless tobacco: Never  Vaping Use   Vaping status: Never Used  Substance and Sexual Activity   Alcohol use: No    Alcohol/week: 0.0 standard drinks of alcohol   Drug use: No   Sexual activity: Not Currently  Other Topics Concern   Not on file  Social History Narrative   Not on file   Social Determinants of Health   Financial Resource Strain: Not on file  Food Insecurity: No Food Insecurity (02/21/2022)   Hunger Vital Sign    Worried About Running Out of Food in the Last Year: Never true    Ran Out of Food in the Last Year: Never true  Transportation Needs: No Transportation Needs (02/21/2022)   PRAPARE - Administrator, Civil Service (Medical): No    Lack of Transportation (Non-Medical): No  Physical Activity: Not on file  Stress: Not on file  Social Connections: Not on file  Intimate Partner Violence: Not At Risk (02/21/2022)   Humiliation, Afraid, Rape, and Kick questionnaire    Fear of Current or Ex-Partner: No    Emotionally Abused: No    Physically Abused: No    Sexually Abused: No    FAMILY HISTORY: Family History  Problem Relation Age of Onset   Breast cancer Mother    Memory loss  Father    Heart disease Sister    Rheumatic fever Sister    Lung disease Brother    Colon cancer Neg Hx     ALLERGIES:  She is allergic to ciprofloxacin and sulfamethoxazole-trimethoprim.  MEDICATIONS:  Current Outpatient Medications  Medication Sig Dispense Refill   allopurinol (ZYLOPRIM) 300 MG tablet Take 300 mg by mouth daily.     amLODipine (NORVASC) 5 MG tablet TAKE 1 TABLET (5 MG TOTAL) BY MOUTH DAILY. 90 tablet 3   aspirin EC 81 MG tablet Take 81 mg by mouth daily. Swallow whole.     budesonide (ENTOCORT EC) 3 MG 24 hr capsule TAKE 1 CAPSULE  EVERY DAY AS DIRECTED 90 capsule 1   ferrous sulfate 325 (65 FE) MG EC tablet Take 325 mg by mouth daily with breakfast.     levothyroxine (SYNTHROID) 88 MCG tablet Take 88 mcg by mouth daily before breakfast.     simvastatin (ZOCOR) 40 MG tablet Take 40 mg by mouth daily.     valsartan (DIOVAN) 160 MG tablet Take 1 tablet (160 mg total) by mouth daily. 90 tablet 3   vitamin B-12 1000 MCG tablet Take 1 tablet (1,000 mcg total) by mouth daily.     No current facility-administered medications for this visit.    REVIEW OF SYSTEMS:    Review of Systems - Oncology  All other systems pertinent were reviewed with the patient and are negative, except as mentioned above.  PHYSICAL EXAMINATION:  ECOG PERFORMANCE STATUS: 1 - Symptomatic but completely ambulatory  Vitals:   11/06/22 1520  Pulse: 78  Resp: 17  Temp: 97.7 F (36.5 C)  SpO2: 100%   Filed Weights   11/06/22 1520  Weight: 143 lb 8 oz (65.1 kg)    Physical Exam Constitutional:      General: She is not in acute distress.    Appearance: Normal appearance.  HENT:     Head: Normocephalic and atraumatic.  Eyes:     General: No scleral icterus.    Conjunctiva/sclera: Conjunctivae normal.  Cardiovascular:     Rate and Rhythm: Normal rate and regular rhythm.     Heart sounds: Normal heart sounds.  Pulmonary:     Effort: Pulmonary effort is normal.     Breath sounds:  Normal breath sounds.  Abdominal:     General: There is no distension.  Musculoskeletal:     Right lower leg: No edema.     Left lower leg: No edema.  Neurological:     General: No focal deficit present.     Mental Status: She is alert and oriented to person, place, and time.  Psychiatric:        Mood and Affect: Mood normal.        Behavior: Behavior normal.        Thought Content: Thought content normal.     LABORATORY DATA:   I have reviewed the data as listed.  Results for orders placed or performed in visit on 11/06/22  Lactate dehydrogenase  Result Value Ref Range   LDH 190 98 - 192 U/L  Iron and Iron Binding Capacity ([facility] only)  Result Value Ref Range   Iron 46 28 - 170 ug/dL   TIBC 308 657 - 846 ug/dL   Saturation Ratios 17 10.4 - 31.8 %   UIBC 228 148 - 442 ug/dL  Comprehensive metabolic panel  Result Value Ref Range   Sodium 140 135 - 145 mmol/L   Potassium 3.8 3.5 - 5.1 mmol/L   Chloride 109 98 - 111 mmol/L   CO2 24 22 - 32 mmol/L   Glucose, Bld 116 (H) 70 - 99 mg/dL   BUN 23 8 - 23 mg/dL   Creatinine, Ser 9.62 (H) 0.44 - 1.00 mg/dL   Calcium 8.9 8.9 - 95.2 mg/dL   Total Protein 6.5 6.5 - 8.1 g/dL   Albumin 3.7 3.5 - 5.0 g/dL   AST 18 15 - 41 U/L   ALT 14 0 - 44 U/L   Alkaline Phosphatase 63 38 - 126 U/L   Total Bilirubin 0.7 0.3 - 1.2 mg/dL   GFR, Estimated 41 (L) >60 mL/min  Anion gap 7 5 - 15  CBC with Differential/Platelet  Result Value Ref Range   WBC 10.0 4.0 - 10.5 K/uL   RBC 3.81 (L) 3.87 - 5.11 MIL/uL   Hemoglobin 11.2 (L) 12.0 - 15.0 g/dL   HCT 46.9 (L) 62.9 - 52.8 %   MCV 90.0 80.0 - 100.0 fL   MCH 29.4 26.0 - 34.0 pg   MCHC 32.7 30.0 - 36.0 g/dL   RDW 41.3 24.4 - 01.0 %   Platelets 185 150 - 400 K/uL   nRBC 0.0 0.0 - 0.2 %   Neutrophils Relative % 80 %   Neutro Abs 8.0 (H) 1.7 - 7.7 K/uL   Lymphocytes Relative 14 %   Lymphs Abs 1.4 0.7 - 4.0 K/uL   Monocytes Relative 4 %   Monocytes Absolute 0.4 0.1 - 1.0 K/uL    Eosinophils Relative 1 %   Eosinophils Absolute 0.1 0.0 - 0.5 K/uL   Basophils Relative 1 %   Basophils Absolute 0.1 0.0 - 0.1 K/uL   Immature Granulocytes 0 %   Abs Immature Granulocytes 0.03 0.00 - 0.07 K/uL      RADIOGRAPHIC STUDIES:  No pertinent imaging studies available to review.  ASSESSMENT & PLAN:   89 y.o. lady with past medical history of collagenous colitis treated with budesonide, diverticulitis, GERD, gout, hyperlipidemia, hypertension, thyroid disease, cardiac arrhythmia, chronic kidney disease.  She is being followed in our clinic for multifactorial anemia.  Anemia - Multifactorial anemia from iron deficiency, anemia of chronic disease related to CKD.  Overall stable hemoglobin levels (11.2) compared to previous values. No signs of blood loss.  - Patient is currently on oral iron supplements with no side effects. Continue oral iron supplementation daily. -Iron studies today show no evidence of iron deficiency.  Ferritin pending.  Currently no indication for IV iron. -Renal function stable overall. -Plan for reevaluation in 6 months.  Collagenous colitis -She is on budesonide.  No recent flare-ups. Occasional diarrhea during flare-ups. -Continue current management and monitor for flare-ups.  Chronic kidney disease, stage 3 unspecified (HCC) Stable, slightly low kidney function. No current follow-up with a nephrologist. -No immediate action required. Monitor kidney function.   Orders Placed This Encounter  Procedures   Comprehensive metabolic panel    Standing Status:   Future    Standing Expiration Date:   11/06/2023   CBC with Differential/Platelet    Standing Status:   Future    Standing Expiration Date:   11/06/2023   Ferritin    Standing Status:   Future    Standing Expiration Date:   11/06/2023   Iron and Iron Binding Capacity ([facility] only)    Standing Status:   Future    Standing Expiration Date:   11/06/2023    Patient will see [place] back in 6 months  or earlier if clinically necessary for updated Hematology evaluation.   I reviewed lab results and outside records for this visit and discussed relevant results with the patient. Diagnosis, plan of care and treatment options were also discussed in detail with the patient. Opportunity provided to ask questions and answers provided to her apparent satisfaction. Provided instructions to call our clinic with any problems, questions or concerns prior to return visit. I recommended to continue follow-up with PCP and sub-specialists. She verbalized understanding and agreed with the plan.    The total time spent in the appointment was 20 minutes encounter with patients including review of chart and various tests results, discussions about plan of  care and coordination of care plan.  Future Appointments  Date Time Provider Department Center  11/17/2022  2:30 PM Freddie Breech, [place] [facility]  05/12/2023 11:15 AM [facility] None  05/12/2023 11:40 AM Bibiana Gillean, Archie Patten, MD [facility] None     Meryl Crutch, MD  11/06/2022 4:15 PM  [facility] [address] [place] [address] Dept: [phone] Dept Fax: [phone]    This document was completed utilizing speech recognition software. Grammatical errors, random word insertions, pronoun errors, and incomplete sentences are an occasional consequence of this system due to software limitations, ambient noise, and hardware issues. Any formal questions or concerns about the content, text or information contained within the body of this dictation should be directly addressed to the provider for clarification.

## 2022-11-06 NOTE — Assessment & Plan Note (Signed)
Stable, slightly low kidney function. No current follow-up with a nephrologist. -No immediate action required. Monitor kidney function.

## 2022-11-17 ENCOUNTER — Encounter: Payer: Self-pay | Admitting: Podiatry

## 2022-11-17 ENCOUNTER — Ambulatory Visit: Payer: Medicare PPO | Admitting: Podiatry

## 2022-11-17 DIAGNOSIS — M79675 Pain in left toe(s): Secondary | ICD-10-CM

## 2022-11-17 DIAGNOSIS — I739 Peripheral vascular disease, unspecified: Secondary | ICD-10-CM | POA: Diagnosis not present

## 2022-11-17 DIAGNOSIS — B351 Tinea unguium: Secondary | ICD-10-CM

## 2022-11-17 DIAGNOSIS — M79674 Pain in right toe(s): Secondary | ICD-10-CM | POA: Diagnosis not present

## 2022-11-22 NOTE — Progress Notes (Signed)
  Subjective:  Patient ID: Sharon Bailey, female    DOB: August 08, 1930,  MRN: [id number]  89 y.o. female presents at risk foot care. Patient has h/o PAD and painful thick toenails that are difficult to trim. Pain interferes with ambulation. Aggravating factors include wearing enclosed shoe gear. Pain is relieved with periodic professional debridement.  Chief Complaint  Patient presents with   Routine Post Op    [facility] PATIENT STATES SHE SAW HER PCP IN OCTOBER     New problem(s): None   PCP is Mitchell, L.August Saucer, MD (Inactive).  Allergies  Allergen Reactions   Ciprofloxacin Hives   Sulfamethoxazole-Trimethoprim Diarrhea and Nausea Only    Other reaction(s): diarrhea   Review of Systems: Negative except as noted in the HPI.   Objective:  Sharon Bailey is a pleasant 89 y.o. female WD, WN in NAD. AAO x 3.  Vascular Examination: Vascular status intact b/l with palpable DP pulses. Nonpalpable PT pulses b/l. CFT immediate b/l. Pedal hair diminished b/l. No edema. No pain with calf compression b/l. Skin temperature gradient WNL b/l. No varicosities noted. No cyanosis or clubbing noted.  Neurological Examination: Sensation grossly intact b/l with 10 gram monofilament. Vibratory sensation intact b/l.  Dermatological Examination: Pedal skin with normal turgor, texture and tone b/l. No open wounds nor interdigital macerations noted. Toenails 1-5 b/l thick, discolored, elongated with subungual debris and pain on dorsal palpation. No hyperkeratotic lesions noted b/l.   Musculoskeletal Examination: Muscle strength 5/5 to b/l LE.  No pain, crepitus noted b/l.Hammertoe deformity noted 2-5 b/l. Patient ambulates independently without assistive aids.   Radiographs: None  Last A1c:       No data to display         Assessment:   1. Pain due to onychomycosis of toenails of both feet   2. PAD (peripheral artery disease) (HCC)    Plan:  -Consent given for treatment as described  below: -Examined patient. -Continue supportive shoe gear daily. -Toenails 1-5 b/l were debrided in length and girth with sterile nail nippers and dremel without iatrogenic bleeding.  -Patient/POA to call should there be question/concern in the interim.  Return in about 3 months (around 02/17/2023).  Freddie Breech, DPM      [facility] LOCATION: [address] [place] [address]                   Office [phone]   [facility] LOCATION: [address] [place], [place] [address] Office [phone]

## 2022-12-09 ENCOUNTER — Other Ambulatory Visit (HOSPITAL_BASED_OUTPATIENT_CLINIC_OR_DEPARTMENT_OTHER): Payer: Self-pay | Admitting: Cardiology

## 2022-12-09 DIAGNOSIS — I10 Essential (primary) hypertension: Secondary | ICD-10-CM

## 2022-12-15 DIAGNOSIS — H31011 Macula scars of posterior pole (postinflammatory) (post-traumatic), right eye: Secondary | ICD-10-CM | POA: Diagnosis not present

## 2022-12-15 DIAGNOSIS — H35723 Serous detachment of retinal pigment epithelium, bilateral: Secondary | ICD-10-CM | POA: Diagnosis not present

## 2022-12-15 DIAGNOSIS — H35363 Drusen (degenerative) of macula, bilateral: Secondary | ICD-10-CM | POA: Diagnosis not present

## 2022-12-15 DIAGNOSIS — H353231 Exudative age-related macular degeneration, bilateral, with active choroidal neovascularization: Secondary | ICD-10-CM | POA: Diagnosis not present

## 2022-12-18 DIAGNOSIS — H353221 Exudative age-related macular degeneration, left eye, with active choroidal neovascularization: Secondary | ICD-10-CM | POA: Diagnosis not present

## 2022-12-21 DIAGNOSIS — H353114 Nonexudative age-related macular degeneration, right eye, advanced atrophic with subfoveal involvement: Secondary | ICD-10-CM | POA: Diagnosis not present

## 2023-02-05 ENCOUNTER — Emergency Department (HOSPITAL_COMMUNITY): Payer: Medicare PPO

## 2023-02-05 ENCOUNTER — Inpatient Hospital Stay (HOSPITAL_COMMUNITY): Payer: Medicare PPO | Admitting: Anesthesiology

## 2023-02-05 ENCOUNTER — Encounter (HOSPITAL_COMMUNITY): Payer: Self-pay | Admitting: Internal Medicine

## 2023-02-05 ENCOUNTER — Other Ambulatory Visit: Payer: Self-pay

## 2023-02-05 ENCOUNTER — Inpatient Hospital Stay (HOSPITAL_COMMUNITY)
Admission: EM | Admit: 2023-02-05 | Discharge: 2023-02-10 | DRG: 480 | Disposition: A | Discharge status: Skilled Nursing Facility | Payer: Medicare PPO | Attending: Internal Medicine | Admitting: Internal Medicine

## 2023-02-05 DIAGNOSIS — L989 Disorder of the skin and subcutaneous tissue, unspecified: Secondary | ICD-10-CM | POA: Diagnosis not present

## 2023-02-05 DIAGNOSIS — G8929 Other chronic pain: Secondary | ICD-10-CM | POA: Diagnosis present

## 2023-02-05 DIAGNOSIS — T40605A Adverse effect of unspecified narcotics, initial encounter: Secondary | ICD-10-CM | POA: Diagnosis not present

## 2023-02-05 DIAGNOSIS — Z79899 Other long term (current) drug therapy: Secondary | ICD-10-CM | POA: Diagnosis not present

## 2023-02-05 DIAGNOSIS — Z79890 Hormone replacement therapy (postmenopausal): Secondary | ICD-10-CM

## 2023-02-05 DIAGNOSIS — W19XXXA Unspecified fall, initial encounter: Secondary | ICD-10-CM | POA: Diagnosis not present

## 2023-02-05 DIAGNOSIS — D62 Acute posthemorrhagic anemia: Secondary | ICD-10-CM | POA: Diagnosis not present

## 2023-02-05 DIAGNOSIS — R2681 Unsteadiness on feet: Secondary | ICD-10-CM | POA: Diagnosis not present

## 2023-02-05 DIAGNOSIS — G4730 Sleep apnea, unspecified: Secondary | ICD-10-CM | POA: Diagnosis not present

## 2023-02-05 DIAGNOSIS — G4733 Obstructive sleep apnea (adult) (pediatric): Secondary | ICD-10-CM | POA: Diagnosis present

## 2023-02-05 DIAGNOSIS — Z803 Family history of malignant neoplasm of breast: Secondary | ICD-10-CM | POA: Diagnosis not present

## 2023-02-05 DIAGNOSIS — N183 Chronic kidney disease, stage 3 unspecified: Secondary | ICD-10-CM | POA: Diagnosis present

## 2023-02-05 DIAGNOSIS — Z4789 Encounter for other orthopedic aftercare: Secondary | ICD-10-CM | POA: Diagnosis not present

## 2023-02-05 DIAGNOSIS — Z7401 Bed confinement status: Secondary | ICD-10-CM | POA: Diagnosis not present

## 2023-02-05 DIAGNOSIS — Z7982 Long term (current) use of aspirin: Secondary | ICD-10-CM

## 2023-02-05 DIAGNOSIS — Z8673 Personal history of transient ischemic attack (TIA), and cerebral infarction without residual deficits: Secondary | ICD-10-CM

## 2023-02-05 DIAGNOSIS — Y92214 College as the place of occurrence of the external cause: Secondary | ICD-10-CM

## 2023-02-05 DIAGNOSIS — H9190 Unspecified hearing loss, unspecified ear: Secondary | ICD-10-CM | POA: Diagnosis present

## 2023-02-05 DIAGNOSIS — Z9049 Acquired absence of other specified parts of digestive tract: Secondary | ICD-10-CM

## 2023-02-05 DIAGNOSIS — N390 Urinary tract infection, site not specified: Secondary | ICD-10-CM | POA: Diagnosis not present

## 2023-02-05 DIAGNOSIS — K52832 Lymphocytic colitis: Secondary | ICD-10-CM | POA: Diagnosis present

## 2023-02-05 DIAGNOSIS — R2689 Other abnormalities of gait and mobility: Secondary | ICD-10-CM | POA: Diagnosis not present

## 2023-02-05 DIAGNOSIS — I150 Renovascular hypertension: Secondary | ICD-10-CM | POA: Diagnosis not present

## 2023-02-05 DIAGNOSIS — Z8249 Family history of ischemic heart disease and other diseases of the circulatory system: Secondary | ICD-10-CM | POA: Diagnosis not present

## 2023-02-05 DIAGNOSIS — D509 Iron deficiency anemia, unspecified: Secondary | ICD-10-CM | POA: Diagnosis not present

## 2023-02-05 DIAGNOSIS — Z90710 Acquired absence of both cervix and uterus: Secondary | ICD-10-CM

## 2023-02-05 DIAGNOSIS — S72002A Fracture of unspecified part of neck of left femur, initial encounter for closed fracture: Secondary | ICD-10-CM

## 2023-02-05 DIAGNOSIS — E039 Hypothyroidism, unspecified: Secondary | ICD-10-CM | POA: Diagnosis present

## 2023-02-05 DIAGNOSIS — N1832 Chronic kidney disease, stage 3b: Secondary | ICD-10-CM | POA: Diagnosis present

## 2023-02-05 DIAGNOSIS — Y92239 Unspecified place in hospital as the place of occurrence of the external cause: Secondary | ICD-10-CM | POA: Diagnosis not present

## 2023-02-05 DIAGNOSIS — W1830XA Fall on same level, unspecified, initial encounter: Secondary | ICD-10-CM | POA: Diagnosis present

## 2023-02-05 DIAGNOSIS — D631 Anemia in chronic kidney disease: Secondary | ICD-10-CM | POA: Diagnosis present

## 2023-02-05 DIAGNOSIS — G9341 Metabolic encephalopathy: Secondary | ICD-10-CM | POA: Diagnosis not present

## 2023-02-05 DIAGNOSIS — E785 Hyperlipidemia, unspecified: Secondary | ICD-10-CM | POA: Diagnosis present

## 2023-02-05 DIAGNOSIS — N1831 Chronic kidney disease, stage 3a: Secondary | ICD-10-CM | POA: Diagnosis not present

## 2023-02-05 DIAGNOSIS — Z882 Allergy status to sulfonamides status: Secondary | ICD-10-CM | POA: Diagnosis not present

## 2023-02-05 DIAGNOSIS — S51012A Laceration without foreign body of left elbow, initial encounter: Secondary | ICD-10-CM | POA: Diagnosis present

## 2023-02-05 DIAGNOSIS — T07XXXA Unspecified multiple injuries, initial encounter: Secondary | ICD-10-CM | POA: Diagnosis not present

## 2023-02-05 DIAGNOSIS — W19XXXD Unspecified fall, subsequent encounter: Secondary | ICD-10-CM | POA: Diagnosis not present

## 2023-02-05 DIAGNOSIS — M545 Low back pain, unspecified: Secondary | ICD-10-CM | POA: Diagnosis present

## 2023-02-05 DIAGNOSIS — M6281 Muscle weakness (generalized): Secondary | ICD-10-CM | POA: Diagnosis not present

## 2023-02-05 DIAGNOSIS — R0902 Hypoxemia: Secondary | ICD-10-CM | POA: Diagnosis not present

## 2023-02-05 DIAGNOSIS — I129 Hypertensive chronic kidney disease with stage 1 through stage 4 chronic kidney disease, or unspecified chronic kidney disease: Secondary | ICD-10-CM | POA: Diagnosis not present

## 2023-02-05 DIAGNOSIS — S72302D Unspecified fracture of shaft of left femur, subsequent encounter for closed fracture with routine healing: Secondary | ICD-10-CM | POA: Diagnosis not present

## 2023-02-05 DIAGNOSIS — R609 Edema, unspecified: Secondary | ICD-10-CM | POA: Diagnosis not present

## 2023-02-05 DIAGNOSIS — S0990XA Unspecified injury of head, initial encounter: Secondary | ICD-10-CM | POA: Diagnosis not present

## 2023-02-05 DIAGNOSIS — D649 Anemia, unspecified: Secondary | ICD-10-CM | POA: Diagnosis present

## 2023-02-05 DIAGNOSIS — D500 Iron deficiency anemia secondary to blood loss (chronic): Secondary | ICD-10-CM | POA: Diagnosis not present

## 2023-02-05 DIAGNOSIS — G459 Transient cerebral ischemic attack, unspecified: Secondary | ICD-10-CM | POA: Diagnosis not present

## 2023-02-05 DIAGNOSIS — H353114 Nonexudative age-related macular degeneration, right eye, advanced atrophic with subfoveal involvement: Secondary | ICD-10-CM | POA: Diagnosis not present

## 2023-02-05 DIAGNOSIS — Z66 Do not resuscitate: Secondary | ICD-10-CM | POA: Diagnosis present

## 2023-02-05 DIAGNOSIS — M25552 Pain in left hip: Secondary | ICD-10-CM | POA: Diagnosis not present

## 2023-02-05 DIAGNOSIS — M109 Gout, unspecified: Secondary | ICD-10-CM | POA: Diagnosis not present

## 2023-02-05 DIAGNOSIS — K219 Gastro-esophageal reflux disease without esophagitis: Secondary | ICD-10-CM | POA: Diagnosis present

## 2023-02-05 DIAGNOSIS — I10 Essential (primary) hypertension: Secondary | ICD-10-CM | POA: Diagnosis not present

## 2023-02-05 DIAGNOSIS — Z8669 Personal history of other diseases of the nervous system and sense organs: Secondary | ICD-10-CM | POA: Diagnosis not present

## 2023-02-05 DIAGNOSIS — Z881 Allergy status to other antibiotic agents status: Secondary | ICD-10-CM

## 2023-02-05 DIAGNOSIS — Z043 Encounter for examination and observation following other accident: Secondary | ICD-10-CM | POA: Diagnosis not present

## 2023-02-05 DIAGNOSIS — S72142D Displaced intertrochanteric fracture of left femur, subsequent encounter for closed fracture with routine healing: Secondary | ICD-10-CM | POA: Diagnosis not present

## 2023-02-05 DIAGNOSIS — I672 Cerebral atherosclerosis: Secondary | ICD-10-CM | POA: Diagnosis not present

## 2023-02-05 DIAGNOSIS — S72142A Displaced intertrochanteric fracture of left femur, initial encounter for closed fracture: Principal | ICD-10-CM | POA: Diagnosis present

## 2023-02-05 DIAGNOSIS — Z96652 Presence of left artificial knee joint: Secondary | ICD-10-CM | POA: Diagnosis not present

## 2023-02-05 DIAGNOSIS — R262 Difficulty in walking, not elsewhere classified: Secondary | ICD-10-CM | POA: Diagnosis not present

## 2023-02-05 DIAGNOSIS — K5900 Constipation, unspecified: Secondary | ICD-10-CM | POA: Diagnosis not present

## 2023-02-05 DIAGNOSIS — M160 Bilateral primary osteoarthritis of hip: Secondary | ICD-10-CM | POA: Diagnosis not present

## 2023-02-05 LAB — CBC WITH DIFFERENTIAL/PLATELET
Abs Immature Granulocytes: 0.06 10*3/uL (ref 0.00–0.07)
Basophils Absolute: 0.1 10*3/uL (ref 0.0–0.1)
Basophils Relative: 1 %
Eosinophils Absolute: 0.6 10*3/uL — ABNORMAL HIGH (ref 0.0–0.5)
Eosinophils Relative: 6 %
HCT: 37.8 % (ref 36.0–46.0)
Hemoglobin: 12 g/dL (ref 12.0–15.0)
Immature Granulocytes: 1 %
Lymphocytes Relative: 16 %
Lymphs Abs: 1.5 10*3/uL (ref 0.7–4.0)
MCH: 28.2 pg (ref 26.0–34.0)
MCHC: 31.7 g/dL (ref 30.0–36.0)
MCV: 88.9 fL (ref 80.0–100.0)
Monocytes Absolute: 0.6 10*3/uL (ref 0.1–1.0)
Monocytes Relative: 6 %
Neutro Abs: 7 10*3/uL (ref 1.7–7.7)
Neutrophils Relative %: 70 %
Platelets: 162 10*3/uL (ref 150–400)
RBC: 4.25 MIL/uL (ref 3.87–5.11)
RDW: 15.2 % (ref 11.5–15.5)
WBC: 9.9 10*3/uL (ref 4.0–10.5)
nRBC: 0 % (ref 0.0–0.2)

## 2023-02-05 LAB — BASIC METABOLIC PANEL
Anion gap: 7 (ref 5–15)
BUN: 23 mg/dL (ref 8–23)
CO2: 22 mmol/L (ref 22–32)
Calcium: 9.1 mg/dL (ref 8.9–10.3)
Chloride: 113 mmol/L — ABNORMAL HIGH (ref 98–111)
Creatinine, Ser: 1.35 mg/dL — ABNORMAL HIGH (ref 0.44–1.00)
GFR, Estimated: 37 mL/min — ABNORMAL LOW (ref >60)
Glucose, Bld: 123 mg/dL — ABNORMAL HIGH (ref 70–99)
Potassium: 3.8 mmol/L (ref 3.5–5.1)
Sodium: 142 mmol/L (ref 135–145)

## 2023-02-05 LAB — GLUCOSE, CAPILLARY: Glucose-Capillary: 181 mg/dL — ABNORMAL HIGH (ref 70–99)

## 2023-02-05 MED ORDER — SIMVASTATIN 20 MG PO TABS: 40.0000 mg | ORAL_TABLET | Freq: Every day | ORAL | Status: DC

## 2023-02-05 MED ORDER — BUDESONIDE 3 MG PO CPEP
6.0000 mg | ORAL_CAPSULE | Freq: Every day | ORAL | Status: DC
  Administered 2023-02-07 – 2023-02-10 (×4): 6 mg via ORAL
  Filled 2023-02-05 (×5): qty 2

## 2023-02-05 MED ORDER — IRBESARTAN 150 MG PO TABS: 150.0000 mg | ORAL_TABLET | Freq: Every day | ORAL | Status: DC

## 2023-02-05 MED ORDER — HYDROCODONE-ACETAMINOPHEN 5-325 MG PO TABS
1.0000 | ORAL_TABLET | Freq: Four times a day (QID) | ORAL | Status: DC | PRN
  Administered 2023-02-05: 2 via ORAL
  Administered 2023-02-06: 1 via ORAL
  Administered 2023-02-06: 2 via ORAL
  Administered 2023-02-07: 1 via ORAL
  Filled 2023-02-05 (×2): qty 1
  Filled 2023-02-05: qty 2
  Filled 2023-02-05: qty 1
  Filled 2023-02-05 (×2): qty 2

## 2023-02-05 MED ORDER — ATORVASTATIN CALCIUM 10 MG PO TABS
20.0000 mg | ORAL_TABLET | Freq: Every day | ORAL | Status: DC
  Administered 2023-02-07 – 2023-02-10 (×4): 20 mg via ORAL
  Filled 2023-02-05 (×4): qty 2

## 2023-02-05 MED ORDER — POLYETHYLENE GLYCOL 3350 17 G PO PACK: 17.0000 g | PACK | Freq: Every day | ORAL | Status: DC | PRN

## 2023-02-05 MED ORDER — FENTANYL CITRATE PF 50 MCG/ML IJ SOSY
50.0000 ug | PREFILLED_SYRINGE | Freq: Once | INTRAMUSCULAR | Status: AC
  Administered 2023-02-05: 50 ug via INTRAVENOUS
  Filled 2023-02-05: qty 1

## 2023-02-05 MED ORDER — BUPIVACAINE-EPINEPHRINE (PF) 0.5% -1:200000 IJ SOLN
INTRAMUSCULAR | Status: DC | PRN
  Administered 2023-02-05: 20 mL via PERINEURAL

## 2023-02-05 MED ORDER — LEVOTHYROXINE SODIUM 88 MCG PO TABS
88.0000 ug | ORAL_TABLET | Freq: Every day | ORAL | Status: DC
Start: 2023-02-06 — End: 2023-02-10
  Administered 2023-02-06 – 2023-02-10 (×5): 88 ug via ORAL
  Filled 2023-02-05 (×6): qty 1

## 2023-02-05 MED ORDER — LIDOCAINE-EPINEPHRINE (PF) 1.5 %-1:200000 IJ SOLN
INTRAMUSCULAR | Status: DC | PRN
  Administered 2023-02-05: 5 mL via PERINEURAL

## 2023-02-05 MED ORDER — HYDROMORPHONE HCL 1 MG/ML IJ SOLN
0.5000 mg | INTRAMUSCULAR | Status: DC | PRN
  Administered 2023-02-05 – 2023-02-07 (×4): 0.5 mg via INTRAVENOUS
  Filled 2023-02-05 (×4): qty 0.5

## 2023-02-05 MED ORDER — AMLODIPINE BESYLATE 5 MG PO TABS
5.0000 mg | ORAL_TABLET | Freq: Every day | ORAL | Status: DC
  Administered 2023-02-07 – 2023-02-10 (×4): 5 mg via ORAL
  Filled 2023-02-05 (×4): qty 1

## 2023-02-05 MED ORDER — FENTANYL CITRATE PF 50 MCG/ML IJ SOSY: 25.0000 ug | PREFILLED_SYRINGE | Freq: Once | INTRAMUSCULAR | Status: DC

## 2023-02-05 MED ORDER — ASPIRIN 81 MG PO TBEC: 81.0000 mg | DELAYED_RELEASE_TABLET | Freq: Every day | ORAL | Status: DC

## 2023-02-05 MED ORDER — FENTANYL CITRATE (PF) 100 MCG/2ML IJ SOLN
INTRAMUSCULAR | Status: AC
  Administered 2023-02-05: 25 ug via INTRAVENOUS
  Filled 2023-02-05: qty 2

## 2023-02-05 MED ORDER — ALLOPURINOL 300 MG PO TABS: 300.0000 mg | ORAL_TABLET | Freq: Every day | ORAL | Status: DC

## 2023-02-05 NOTE — H&P (Signed)
History and Physical   Sharon Bailey HCW:[id number] DOB: 11/29/1930 DOA: 02/05/2023  PCP: Irven Coe, MD   Patient coming from:  home/Drs. Office  Chief Complaint: Fall, hip pain  HPI: Sharon Bailey is a 89 y.o. female with medical history significant of hypertension, hyperlipidemia, CVA, CKD 3B, anemia, gout, hypothyroidism, low back pain, OSA, lymphocytic colitis, hearing loss presenting after a fall.  Patient was at a doctor's appointment.  Patient went to the bathroom and fell backwards and landed on her left hip and family think she hit her head as well.  Patient was unable to walk afterwards due to pain.  Patient was down for about an hour and then EMS arrived and noted left leg was shortened and rotated.  Patient does not take any blood thinners.  Denies fevers, chills, chest pain, shortness of breath, abdominal pain, constipation, diarrhea, nausea, vomiting.  ED Course: Vital signs in ED notable for blood pressure in the 130s to 150s systolic.  Transiently on 2 L supplemental oxygen.  Lab workup included BMP with creatinine stable 1.35, glucose 123.  CBC within normal limits.  Chest x-ray showed no acute Gershon Mussel.  CT head showed no acute normality.  Pelvis x-ray and femur x-ray showed both showed left comminuted and angulated intratrochanteric femur fracture.  Orthopedic was consulted recommend n.p.o. midnight for likely surgery tomorrow.  Patient received fentanyl in the ED.  Review of Systems: As per HPI otherwise all other systems reviewed and are negative.  Past Medical History:  Diagnosis Date   Abnormal EKG    Acute CVA (cerebrovascular accident) (HCC) 06/11/2019   AKI (acute kidney injury) (HCC) 06/11/2019   Collagenous colitis    Collagenous colitis    Diverticulitis    Diverticulosis    Fluttering heart    GERD (gastroesophageal reflux disease)    Gout    Gout attack 09/28/2012   Hammer toe of left foot 09/28/2012   Hyperlipidemia    Hypertension     Hypertensive emergency    Hypothyroidism    Irregular heart beat    Pain in joint, ankle and foot 09/28/2012   Status post foot surgery 10/11/2012   Thyroid disease     Past Surgical History:  Procedure Laterality Date   ABDOMINAL HYSTERECTOMY     ADENOIDECTOMY     CHOLECYSTECTOMY     Excision Ganglion Toe Left 09/29/2012   Lt #2 @ [facility]   Hammer toe repair Left 09/29/2012   Lt #2 @ [facility]   TONSILLECTOMY      Social History  reports that she has never smoked. She has never used smokeless tobacco. She reports that she does not drink alcohol and does not use drugs.  Allergies  Allergen Reactions   Bactrim [Sulfamethoxazole-Trimethoprim] Diarrhea and Nausea Only   Cipro [Ciprofloxacin Hcl] Hives    Family History  Problem Relation Age of Onset   Breast cancer Mother    Memory loss Father    Heart disease Sister    Rheumatic fever Sister    Lung disease Brother    Colon cancer Neg Hx   Reviewed on admission  Prior to Admission medications   Medication Sig Start Date End Date Taking? Authorizing Provider  allopurinol (ZYLOPRIM) 300 MG tablet Take 300 mg by mouth daily.   Yes [provider]  amLODipine (NORVASC) 5 MG tablet TAKE 1 TABLET (5 MG TOTAL) BY MOUTH DAILY. 10/22/22  Yes Jodelle Red, MD  aspirin EC 81 MG tablet Take 81 mg by mouth daily.  Swallow whole.   Yes [provider]  budesonide (ENTOCORT EC) 3 MG 24 hr capsule TAKE 1 CAPSULE EVERY DAY AS DIRECTED 03/04/22  Yes Hilarie Fredrickson, MD  ferrous sulfate 325 (65 FE) MG EC tablet Take 325 mg by mouth daily with breakfast.   Yes [provider]  ibuprofen (ADVIL) 200 MG tablet Take 400 mg by mouth daily as needed for headache or mild pain (pain score 1-3).   Yes [provider]  levothyroxine (SYNTHROID) 88 MCG tablet Take 88 mcg by mouth daily.   Yes [provider]  simvastatin (ZOCOR) 40 MG tablet Take 40 mg by mouth daily.   Yes [provider]  traMADol  (ULTRAM) 50 MG tablet Take 50 mg by mouth daily as needed for severe pain (pain score 7-10).   Yes [provider]  valsartan (DIOVAN) 160 MG tablet TAKE 1 TABLET EVERY DAY 12/09/22  Yes Jodelle Red, MD  vitamin B-12 1000 MCG tablet Take 1 tablet (1,000 mcg total) by mouth daily. 06/14/19  Yes Joseph Art, DO    Physical Exam: Vitals:   02/05/23 1329 02/05/23 1330 02/05/23 1537  BP: (!) 131/52  (!) 157/55  Pulse: 71  81  Resp: 17  17  Temp: (!) 97.4 F (36.3 C)  97.8 F (36.6 C)  TempSrc: Oral  Oral  SpO2: (!) 87% 96% 100%  Weight:   65.8 kg  Height:   5\' 4"  (1.626 m)    Physical Exam Constitutional:      General: She is not in acute distress.    Appearance: Normal appearance.  HENT:     Head: Normocephalic and atraumatic.     Mouth/Throat:     Mouth: Mucous membranes are moist.     Pharynx: Oropharynx is clear.  Eyes:     Extraocular Movements: Extraocular movements intact.     Pupils: Pupils are equal, round, and reactive to light.  Cardiovascular:     Rate and Rhythm: Normal rate and regular rhythm.     Pulses: Normal pulses.     Heart sounds: Normal heart sounds.  Pulmonary:     Effort: Pulmonary effort is normal. No respiratory distress.     Breath sounds: Normal breath sounds.  Abdominal:     General: Bowel sounds are normal. There is no distension.     Palpations: Abdomen is soft.     Tenderness: There is no abdominal tenderness.  Musculoskeletal:        General: No swelling or deformity.     Comments: Bilateral lower extremities neurovascular intact.  Left lower extremity foreshortened and externally rotated.  Skin:    General: Skin is warm and dry.     Comments: Skin tear left elbow.  Neurological:     General: No focal deficit present.     Mental Status: Mental status is at baseline.    Labs on Admission: I have personally reviewed following labs and imaging studies  CBC: Recent Labs  Lab 02/05/23 1353  WBC 9.9  NEUTROABS 7.0   HGB 12.0  HCT 37.8  MCV 88.9  PLT 162    Basic Metabolic Panel: Recent Labs  Lab 02/05/23 1353  NA 142  K 3.8  CL 113*  CO2 22  GLUCOSE 123*  BUN 23  CREATININE 1.35*  CALCIUM 9.1    GFR: Estimated Creatinine Clearance: 25.8 mL/min (A) (by C-G formula based on SCr of 1.35 mg/dL (H)).  Liver Function Tests: No results for input(s): "AST", "ALT", "  ALKPHOS", "BILITOT", "PROT", "ALBUMIN" in the last 168 hours.  Urine analysis:    Component Value Date/Time   COLORURINE YELLOW 12/23/2021 1500   APPEARANCEUR HAZY (A) 12/23/2021 1500   LABSPEC 1.026 12/23/2021 1500   PHURINE 5.0 12/23/2021 1500   GLUCOSEU NEGATIVE 12/23/2021 1500   HGBUR NEGATIVE 12/23/2021 1500   BILIRUBINUR NEGATIVE 12/23/2021 1500   KETONESUR NEGATIVE 12/23/2021 1500   PROTEINUR 30 (A) 12/23/2021 1500   UROBILINOGEN 0.2 10/07/2014 1830   NITRITE NEGATIVE 12/23/2021 1500   LEUKOCYTESUR MODERATE (A) 12/23/2021 1500    Radiological Exams on Admission: CT HEAD WO CONTRAST ( ) Result Date: 02/05/2023 CLINICAL DATA:  Head trauma EXAM: CT HEAD WITHOUT CONTRAST TECHNIQUE: Contiguous axial images were obtained from the base of the skull through the vertex without intravenous contrast. RADIATION DOSE REDUCTION: This exam was performed according to the departmental dose-optimization program which includes automated exposure control, adjustment of the mA and/or kV according to patient size and/or use of iterative reconstruction technique. COMPARISON:  10/23/2019 CT head FINDINGS: Brain: No evidence of acute infarction, hemorrhage, mass, mass effect, or midline shift. No hydrocephalus or extra-axial fluid collection. Age related cerebral atrophy. Periventricular white matter changes, likely the sequela of chronic small vessel ischemic disease. Vascular: No hyperdense vessel. Atherosclerotic calcifications in the intracranial carotid and vertebral arteries. Skull: Negative for fracture or focal lesion. Sinuses/Orbits:  Mucosal thickening in the right sphenoid sinus and posterior left ethmoid air cells. Status post bilateral lens replacements. Other: The mastoid air cells are well aerated. IMPRESSION: No acute intracranial process. Electronically Signed   By: Wiliam Ke M.D.   On: 02/05/2023 15:46   DG Chest 1 View Result Date: 02/05/2023 CLINICAL DATA:  Fall. EXAM: CHEST  1 VIEW COMPARISON:  06/11/2019. FINDINGS: Bilateral lung fields are clear. Bilateral costophrenic angles are clear. Stable cardio-mediastinal silhouette. No acute osseous abnormalities. The soft tissues are within normal limits. IMPRESSION: No active disease. Electronically Signed   By: Jules Schick M.D.   On: 02/05/2023 14:52   DG Femur Min 2 Views Left Result Date: 02/05/2023 CLINICAL DATA:  Fall.  Suspected hip fracture. EXAM: LEFT FEMUR 2 VIEWS; PELVIS - 1-2 VIEW COMPARISON:  None Available. FINDINGS: There is comminuted and angulated fracture of intertrochanteric left proximal femur. No other acute fracture or dislocation. No aggressive osseous lesion. Visualized sacral arcuate lines are unremarkable. Unremarkable symphysis pubis. There are mild degenerative changes of bilateral hip joints without significant joint space narrowing. Osteophytosis of the superior acetabulum. Patient is status post left knee arthroplasty. No radiopaque foreign bodies. IMPRESSION: *Comminuted and angulated fracture of the intertrochanteric left femur. Electronically Signed   By: Jules Schick M.D.   On: 02/05/2023 14:52   DG Pelvis 1-2 Views Result Date: 02/05/2023 CLINICAL DATA:  Fall.  Suspected hip fracture. EXAM: LEFT FEMUR 2 VIEWS; PELVIS - 1-2 VIEW COMPARISON:  None Available. FINDINGS: There is comminuted and angulated fracture of intertrochanteric left proximal femur. No other acute fracture or dislocation. No aggressive osseous lesion. Visualized sacral arcuate lines are unremarkable. Unremarkable symphysis pubis. There are mild degenerative changes of  bilateral hip joints without significant joint space narrowing. Osteophytosis of the superior acetabulum. Patient is status post left knee arthroplasty. No radiopaque foreign bodies. IMPRESSION: *Comminuted and angulated fracture of the intertrochanteric left femur. Electronically Signed   By: Jules Schick M.D.   On: 02/05/2023 14:52   EKG: Ordered in the ED but not yet available for review.  Assessment/Plan Principal Problem:   Closed comminuted intertrochanteric fracture of  left femur, initial encounter [facility]) Active Problems:   Essential hypertension   Hyperlipidemia   Obstructive sleep apnea   History of CVA (cerebrovascular accident)   Chronic kidney disease, stage 3 unspecified (HCC)   Anemia   Left femur fracture Fall > Fell backwards in public bathroom earlier today landed on left hip.  Unable to walk after.  X-ray confirmed left comminuted and angulated intratrochanteric femur fracture. > Orthopedics consulted and plan for surgery tomorrow.  Received some fentanyl in the ED. - Monitor on MedSurg unit overnight with continuous pulse ox - Appreciate orthopedic surgery recommendations and assistance - Hip fracture protocol - Pain control medication with as needed Norco for moderate to severe pain and Dilaudid for severe breakthrough pain - Consult anesthesiology for left-sided nerve block. - Supportive care  Hypertension - Continue home amlodipine - Replace home valsartan with formulary irbesartan  Hyperlipidemia - Continue home simvastatin  History of CVA -Continue home ASA, simvastatin  CKD 3B > Creatinine stable 1.35 - Trend renal function and electrolytes  Anemia - Normal hemoglobin in the ED  Gout - Continue home allopurinol  Hypothyroidism - Continue on Synthroid  Chronic low back pain - Will be receiving alternative pain medication in the setting of hip fracture  OSA - Not currently on CPAP  Lymphocytic colitis - Continue home p.o.  budesonide  Hearing loss - Noted  DVT prophylaxis: SCDs Code Status:   DNR/DNI Family Communication:  Updated at bedside  Disposition Plan:   Patient is from:  Home  Anticipated DC to:  Pending clinical course  Anticipated DC date:  2 to 3 days  Anticipated DC barriers: None  Consults called:  [facility] surgery Admission status:  Inpatient, MedSurg  Severity of Illness: The appropriate patient status for this patient is INPATIENT. Inpatient status is judged to be reasonable and necessary in order to provide the required intensity of service to ensure the patient's safety. The patient's presenting symptoms, physical exam findings, and initial radiographic and laboratory data in the context of their chronic comorbidities is felt to place them at high risk for further clinical deterioration. Furthermore, it is not anticipated that the patient will be medically stable for discharge from the hospital within 2 midnights of admission.   * I certify that at the point of admission it is my clinical judgment that the patient will require inpatient hospital care spanning beyond 2 midnights from the point of admission due to high intensity of service, high risk for further deterioration and high frequency of surveillance required.Synetta Fail MD [facility]  How to contact the [facility] Attending or Consulting provider 7A - 7P or covering provider during after hours 7P -7A, for this patient?   Check the care team in [facility] and look for a) attending/consulting [facility] provider listed and b) the [facility] team listed Log into [email or web address] and use [place]'s universal password to access. If you do not have the password, please contact the hospital operator. Locate the [facility] provider you are looking for under [facility] and page to a number that you can be directly reached. If you still have difficulty reaching the provider, please page the [facility] (Director on Call) for the Hospitalists listed on amion  for assistance.  02/05/2023, 4:24 PM

## 2023-02-05 NOTE — Anesthesia Preprocedure Evaluation (Signed)
Anesthesia Evaluation  Patient identified by MRN, date of birth, ID band Patient awake    Reviewed: Allergy & Precautions, Patient's Chart, lab work & pertinent test results  History of Anesthesia Complications Negative for: history of anesthetic complications  Airway Mallampati: III  TM Distance: >3 FB Neck ROM: Full    Dental no notable dental hx.    Pulmonary sleep apnea    breath sounds clear to auscultation       Cardiovascular hypertension, Pt. on medications + Peripheral Vascular Disease   Rhythm:Regular Rate:Normal  1. Left ventricular ejection fraction, by estimation, is 65 to 70%. The  left ventricle has normal function. The left ventricle has no regional  wall motion abnormalities. There is severe left ventricular hypertrophy.  Left ventricular diastolic parameters   are consistent with Grade I diastolic dysfunction (impaired relaxation).  Elevated left ventricular end-diastolic pressure.   2. Right ventricular systolic function is normal. The right ventricular  size is normal. There is mildly elevated pulmonary artery systolic  pressure.   3. The mitral valve is grossly normal. Trivial mitral valve  regurgitation.   4. The aortic valve is tricuspid. Aortic valve regurgitation is trivial.  Mild aortic valve stenosis. Aortic valve area, by VTI measures 1.67 cm.  Aortic valve mean gradient measures 11.0 mmHg. Aortic valve Vmax measures  2.50 m/s.   5. The inferior vena cava is normal in size with <50% respiratory  variability, suggesting right atrial pressure of 8 mmHg.      Neuro/Psych CVA  negative psych ROS   GI/Hepatic ,GERD  ,,  Endo/Other  Hypothyroidism    Renal/GU Renal diseaseLab Results      Component                Value               Date                      NA                       142                 02/05/2023                K                        3.8                 02/05/2023                 CO2                      22                  02/05/2023                GLUCOSE                  123 (H)             02/05/2023                BUN                      23                  02/05/2023  CREATININE               1.35 (H)            02/05/2023                CALCIUM                  9.1                 02/05/2023                EGFR                     39 (L)              11/20/2020                GFRNONAA                 37 (L)              02/05/2023                Musculoskeletal   Abdominal Normal abdominal exam  (+)   Peds  Hematology  (+) Blood dyscrasia, anemia Lab Results      Component                Value               Date                      WBC                      9.9                 02/05/2023                HGB                      12.0                02/05/2023                HCT                      37.8                02/05/2023                MCV                      88.9                02/05/2023                PLT                      162                 02/05/2023              Anesthesia Other Findings   Reproductive/Obstetrics                             Anesthesia Physical Anesthesia Plan  ASA: 3  Anesthesia Plan: General   Post-op Pain Management:    Induction: Intravenous  PONV Risk Score and Plan: 3 and Treatment may vary due to age or medical condition, Ondansetron and Dexamethasone  Airway Management Planned: Mask and Oral ETT  Additional Equipment: None  Intra-op Plan:   Post-operative Plan: Extubation in OR  Informed Consent: I have reviewed the patients History and Physical, chart, labs and discussed the procedure including the risks, benefits and alternatives for the proposed anesthesia with the patient or authorized representative who has indicated his/her understanding and acceptance.     Dental advisory given  Plan Discussed with: CRNA  Anesthesia Plan Comments:          Anesthesia Quick Evaluation

## 2023-02-05 NOTE — Anesthesia Procedure Notes (Signed)
Anesthesia Regional Block: Femoral nerve block   Pre-Anesthetic Checklist: , timeout performed,  Correct Patient, Correct Site, Correct Laterality,  Correct Procedure, Correct Position, site marked,  Risks and benefits discussed,  Surgical consent,  Pre-op evaluation,  At surgeon's request and post-op pain management  Laterality: Left and Lower  Prep: chloraprep       Needles:  Injection technique: Single-shot      Needle Length: 9cm  Needle Gauge: 22     Additional Needles: Arrow StimuQuik ECHO Echogenic Stimulating PNB Needle  Procedures:,,,, ultrasound used (permanent image in chart),,     Nerve Stimulator or Paresthesia:  Response: quad, 0.22 mA  Additional Responses:   Narrative:  Start time: 02/05/2023 6:48 PM End time: 02/05/2023 6:58 PM Injection made incrementally with aspirations every 5 mL.  Performed by: Personally  Anesthesiologist: Val Eagle, MD

## 2023-02-05 NOTE — ED Triage Notes (Signed)
Pt came from [facility] where she had a mechanical fall in the bathroom. Pt was w/ daughter who work admissions there. Pt was on the floor for 1 hour before EMS arrival. No blood thinners, L shortening/rotation of the leg. Pt HTN enroute. HX: HTN. A&O x4. Daughter on the way  EMS Placed 18 G L AC Total of of fentanyl given, last dose 30 mins ago

## 2023-02-05 NOTE — Consult Note (Signed)
Brief consult note for full consult note a fall in the AM  89 year old female with a left reverse obliquity intertrochanteric fracture after mechanical fall  Plan for admission to medical service and left hip intramedullary nail tomorrow morning  NWB Hold DVT ppx until after surgery NPO at midnight

## 2023-02-05 NOTE — ED Notes (Signed)
Pt placed on purwick. Tolerated well

## 2023-02-05 NOTE — Anesthesia Preprocedure Evaluation (Signed)
Anesthesia Evaluation  Patient identified by MRN, date of birth, ID band Patient awake    Reviewed: Allergy & Precautions, Patient's Chart, lab work & pertinent test results  History of Anesthesia Complications Negative for: history of anesthetic complications  Airway Mallampati: III  TM Distance: >3 FB Neck ROM: Full    Dental   Pulmonary sleep apnea    breath sounds clear to auscultation       Cardiovascular hypertension, Pt. on medications + Peripheral Vascular Disease   Rhythm:Regular  1. Left ventricular ejection fraction, by estimation, is 65 to 70%. The  left ventricle has normal function. The left ventricle has no regional  wall motion abnormalities. There is severe left ventricular hypertrophy.  Left ventricular diastolic parameters   are consistent with Grade I diastolic dysfunction (impaired relaxation).  Elevated left ventricular end-diastolic pressure.   2. Right ventricular systolic function is normal. The right ventricular  size is normal. There is mildly elevated pulmonary artery systolic  pressure.   3. The mitral valve is grossly normal. Trivial mitral valve  regurgitation.   4. The aortic valve is tricuspid. Aortic valve regurgitation is trivial.  Mild aortic valve stenosis. Aortic valve area, by VTI measures 1.67 cm.  Aortic valve mean gradient measures 11.0 mmHg. Aortic valve Vmax measures  2.50 m/s.   5. The inferior vena cava is normal in size with <50% respiratory  variability, suggesting right atrial pressure of 8 mmHg.      Neuro/Psych CVA  negative psych ROS   GI/Hepatic ,GERD  ,,  Endo/Other  Hypothyroidism    Renal/GU Renal diseaseLab Results      Component                Value               Date                      NA                       142                 02/05/2023                K                        3.8                 02/05/2023                CO2                      22                   02/05/2023                GLUCOSE                  123 (H)             02/05/2023                BUN                      23                  02/05/2023  CREATININE               1.35 (H)            02/05/2023                CALCIUM                  9.1                 02/05/2023                EGFR                     39 (L)              11/20/2020                GFRNONAA                 37 (L)              02/05/2023                Musculoskeletal   Abdominal   Peds  Hematology negative hematology ROS (+) Lab Results      Component                Value               Date                      WBC                      9.9                 02/05/2023                HGB                      12.0                02/05/2023                HCT                      37.8                02/05/2023                MCV                      88.9                02/05/2023                PLT                      162                 02/05/2023              Anesthesia Other Findings   Reproductive/Obstetrics                              Anesthesia Physical Anesthesia Plan  ASA: 3  Anesthesia Plan: Regional   Post-op Pain Management: Regional block*   Induction:   PONV Risk Score and  Plan: 2 and Treatment may vary due to age or medical condition  Airway Management Planned: Nasal Cannula and Natural Airway  Additional Equipment: None  Intra-op Plan:   Post-operative Plan:   Informed Consent: I have reviewed the patients History and Physical, chart, labs and discussed the procedure including the risks, benefits and alternatives for the proposed anesthesia with the patient or authorized representative who has indicated his/her understanding and acceptance.       Plan Discussed with:   Anesthesia Plan Comments:          Anesthesia Quick Evaluation

## 2023-02-05 NOTE — ED Provider Notes (Signed)
[place] EMERGENCY DEPARTMENT AT [facility] Provider Note   CSN: [id number] Arrival date & time: 02/05/23  1327     History  Chief Complaint  Patient presents with   Marletta Lor    Sharon Bailey is a 89 y.o. female.  The history is provided by the patient, a relative and medical records. No language interpreter was used.  Fall This is a new problem. The current episode started 1 to 2 hours ago. The problem occurs rarely. The problem has not changed since onset.Pertinent negatives include no chest pain, no abdominal pain, no headaches and no shortness of breath. Nothing relieves the symptoms. She has tried nothing for the symptoms.       Home Medications Prior to Admission medications   Medication Sig Start Date End Date Taking? Authorizing Provider  allopurinol (ZYLOPRIM) 300 MG tablet Take 300 mg by mouth daily.    [provider]  amLODipine (NORVASC) 5 MG tablet TAKE 1 TABLET (5 MG TOTAL) BY MOUTH DAILY. 10/22/22   Jodelle Red, MD  aspirin EC 81 MG tablet Take 81 mg by mouth daily. Swallow whole.    [provider]  budesonide (ENTOCORT EC) 3 MG 24 hr capsule TAKE 1 CAPSULE EVERY DAY AS DIRECTED 03/04/22   Hilarie Fredrickson, MD  ferrous sulfate 325 (65 FE) MG EC tablet Take 325 mg by mouth daily with breakfast.    [provider]  levothyroxine (SYNTHROID) 88 MCG tablet Take 88 mcg by mouth daily before breakfast.    [provider]  simvastatin (ZOCOR) 40 MG tablet Take 40 mg by mouth daily.    [provider]  valsartan (DIOVAN) 160 MG tablet TAKE 1 TABLET EVERY DAY 12/09/22   Jodelle Red, MD  vitamin B-12 1000 MCG tablet Take 1 tablet (1,000 mcg total) by mouth daily. 06/14/19   Joseph Art, DO      Allergies    Ciprofloxacin and Sulfamethoxazole-trimethoprim    Review of Systems   Review of Systems  Constitutional:  Negative for chills, diaphoresis, fatigue and fever.  HENT:  Negative for  congestion.   Eyes:  Negative for visual disturbance.  Respiratory:  Negative for cough, chest tightness and shortness of breath.   Cardiovascular:  Negative for chest pain.  Gastrointestinal:  Negative for abdominal pain, constipation, diarrhea, nausea and vomiting.  Genitourinary:  Negative for dysuria and flank pain.  Musculoskeletal:  Negative for back pain, neck pain and neck stiffness.  Skin:  Positive for wound. Negative for rash.  Neurological:  Negative for weakness, light-headedness, numbness and headaches.  Psychiatric/Behavioral:  Negative for agitation and confusion.   All other systems reviewed and are negative.   Physical Exam Updated Vital Signs BP (!) 131/52   Pulse 71   Temp (!) 97.4 F (36.3 C) (Oral)   Resp 17   SpO2 96%  Physical Exam Vitals and nursing note reviewed.  Constitutional:      General: She is not in acute distress.    Appearance: She is well-developed. She is not ill-appearing, toxic-appearing or diaphoretic.  HENT:     Head: Normocephalic and atraumatic.     Nose: No congestion or rhinorrhea.     Mouth/Throat:     Mouth: Mucous membranes are moist.     Pharynx: No oropharyngeal exudate or posterior oropharyngeal erythema.  Eyes:     Extraocular Movements: Extraocular movements intact.     Conjunctiva/sclera: Conjunctivae normal.     Pupils: Pupils are equal, round, and  reactive to light.  Cardiovascular:     Rate and Rhythm: Normal rate and regular rhythm.     Heart sounds: Murmur heard.  Pulmonary:     Effort: Pulmonary effort is normal. No respiratory distress.     Breath sounds: Normal breath sounds. No wheezing, rhonchi or rales.  Chest:     Chest wall: No tenderness.  Abdominal:     General: Abdomen is flat.     Palpations: Abdomen is soft.     Tenderness: There is no abdominal tenderness. There is no guarding or rebound.  Musculoskeletal:        General: Tenderness and signs of injury present. No swelling.     Cervical back:  Neck supple.       Legs:     Comments: Tenderness to left hip with any movement.  Left leg is shortened and rotated.  Intact sensation strength and pulses distally in the foot.  No tenderness to the knee or ankle.  Skin tear to the left elbow but no tenderness.  Bruising.  Intact sensation strength and pulses in upper extremities.  No tenderness to the head or neck.  Skin:    General: Skin is warm and dry.     Capillary Refill: Capillary refill takes less than 2 seconds.     Findings: Bruising present.  Neurological:     General: No focal deficit present.     Mental Status: She is alert.     Sensory: No sensory deficit.     Motor: No weakness.  Psychiatric:        Mood and Affect: Mood normal.     ED Results / Procedures / Treatments   Labs (all labs ordered are listed, but only abnormal results are displayed) Labs Reviewed  CBC WITH DIFFERENTIAL/PLATELET - Abnormal; Notable for the following components:      Result Value   Eosinophils Absolute 0.6 (*)    All other components within normal limits  BASIC METABOLIC PANEL - Abnormal; Notable for the following components:   Chloride 113 (*)    Glucose, Bld 123 (*)    Creatinine, Ser 1.35 (*)    GFR, Estimated 37 (*)    All other components within normal limits    EKG None  Radiology CT HEAD WO CONTRAST ( ) Result Date: 02/05/2023 CLINICAL DATA:  Head trauma EXAM: CT HEAD WITHOUT CONTRAST TECHNIQUE: Contiguous axial images were obtained from the base of the skull through the vertex without intravenous contrast. RADIATION DOSE REDUCTION: This exam was performed according to the departmental dose-optimization program which includes automated exposure control, adjustment of the mA and/or kV according to patient size and/or use of iterative reconstruction technique. COMPARISON:  10/23/2019 CT head FINDINGS: Brain: No evidence of acute infarction, hemorrhage, mass, mass effect, or midline shift. No hydrocephalus or extra-axial fluid  collection. Age related cerebral atrophy. Periventricular white matter changes, likely the sequela of chronic small vessel ischemic disease. Vascular: No hyperdense vessel. Atherosclerotic calcifications in the intracranial carotid and vertebral arteries. Skull: Negative for fracture or focal lesion. Sinuses/Orbits: Mucosal thickening in the right sphenoid sinus and posterior left ethmoid air cells. Status post bilateral lens replacements. Other: The mastoid air cells are well aerated. IMPRESSION: No acute intracranial process. Electronically Signed   By: Wiliam Ke M.D.   On: 02/05/2023 15:46   DG Chest 1 View Result Date: 02/05/2023 CLINICAL DATA:  Fall. EXAM: CHEST  1 VIEW COMPARISON:  06/11/2019. FINDINGS: Bilateral lung fields are clear. Bilateral costophrenic angles are clear.  Stable cardio-mediastinal silhouette. No acute osseous abnormalities. The soft tissues are within normal limits. IMPRESSION: No active disease. Electronically Signed   By: Jules Schick M.D.   On: 02/05/2023 14:52   DG Femur Min 2 Views Left Result Date: 02/05/2023 CLINICAL DATA:  Fall.  Suspected hip fracture. EXAM: LEFT FEMUR 2 VIEWS; PELVIS - 1-2 VIEW COMPARISON:  None Available. FINDINGS: There is comminuted and angulated fracture of intertrochanteric left proximal femur. No other acute fracture or dislocation. No aggressive osseous lesion. Visualized sacral arcuate lines are unremarkable. Unremarkable symphysis pubis. There are mild degenerative changes of bilateral hip joints without significant joint space narrowing. Osteophytosis of the superior acetabulum. Patient is status post left knee arthroplasty. No radiopaque foreign bodies. IMPRESSION: *Comminuted and angulated fracture of the intertrochanteric left femur. Electronically Signed   By: Jules Schick M.D.   On: 02/05/2023 14:52   DG Pelvis 1-2 Views Result Date: 02/05/2023 CLINICAL DATA:  Fall.  Suspected hip fracture. EXAM: LEFT FEMUR 2 VIEWS; PELVIS - 1-2  VIEW COMPARISON:  None Available. FINDINGS: There is comminuted and angulated fracture of intertrochanteric left proximal femur. No other acute fracture or dislocation. No aggressive osseous lesion. Visualized sacral arcuate lines are unremarkable. Unremarkable symphysis pubis. There are mild degenerative changes of bilateral hip joints without significant joint space narrowing. Osteophytosis of the superior acetabulum. Patient is status post left knee arthroplasty. No radiopaque foreign bodies. IMPRESSION: *Comminuted and angulated fracture of the intertrochanteric left femur. Electronically Signed   By: Jules Schick M.D.   On: 02/05/2023 14:52    Procedures Procedures    Medications Ordered in ED Medications  fentaNYL (SUBLIMAZE) injection 50 mcg (50 mcg Intravenous Given 02/05/23 1431)    ED Course/ Medical Decision Making/ A&P                                 Medical Decision Making Amount and/or Complexity of Data Reviewed Labs: ordered. Radiology: ordered.  Risk Prescription drug management.    Sharon Bailey is a 89 y.o. female with a past medical history significant for hypertension, hyperlipidemia, previous diverticulitis, hypothyroidism, GERD, and gout who presents with fall.  According to family, patient was walking the bathroom when she tripped and fell backwards.  She hit her left hip on the ground and has deformity and pain.  Left leg is shortened she is not able to walk.  She reports skin tear to her left elbow but is not bothering her.  She also denies hitting her head or getting knocked out but family said she did hit her head on the ground.  Otherwise patient 89 preceding symptoms and was doing well.  She was seeing her eye doctor otherwise.  On exam, lungs clear.  Chest nontender.  Abdomen nontender.  Neck nontender.  Head nontender.  Pupils symmetric and reactive with normal extract movements.  No focal neurologic deficits initially.  Tenderness to the left hip and  pain with any hip movement.  Intact sensation strength and pulses distally but she does have a shortened rotated left leg.  Good grip strength bilaterally.  Skin tears to left elbow that we washed and dressed.  Tetanus shot is up-to-date per family.  She will get x-rays for likely hip fracture.  Will get CT head as well.  Will get chest x-ray for likely preoperative screening.  She did not want elbow x-ray as she was not bothering her.  She will get screening  labs.  Anticipate admission for hip fracture.  Workup returned and she does have a left intertrochanteric hip fracture.  CT head did not show intracranial bleed or skull fracture.  X-ray reassuring.  Call orthopedics and then call medicine for admission.        Final Clinical Impression(s) / ED Diagnoses Final diagnoses:  Fall, initial encounter  Closed fracture of left hip, initial encounter (HCC)    Clinical Impression: 1. Fall, initial encounter   2. Closed fracture of left hip, initial encounter [facility])     Disposition: Admit  This note was prepared with assistance of Dragon voice recognition software. Occasional wrong-word or sound-a-like substitutions may have occurred due to the inherent limitations of voice recognition software.     Sian Rockers, Canary Brim, MD 02/05/23 [phone]

## 2023-02-06 ENCOUNTER — Other Ambulatory Visit: Payer: Self-pay

## 2023-02-06 ENCOUNTER — Inpatient Hospital Stay (HOSPITAL_COMMUNITY): Payer: Self-pay | Admitting: Anesthesiology

## 2023-02-06 ENCOUNTER — Encounter (HOSPITAL_COMMUNITY): Payer: Self-pay | Admitting: Internal Medicine

## 2023-02-06 ENCOUNTER — Encounter (HOSPITAL_COMMUNITY)
Admission: EM | Disposition: A | Discharge status: Skilled Nursing Facility | Payer: Self-pay | Source: Home / Self Care | Attending: Internal Medicine

## 2023-02-06 ENCOUNTER — Inpatient Hospital Stay (HOSPITAL_COMMUNITY): Payer: Medicare PPO

## 2023-02-06 DIAGNOSIS — E039 Hypothyroidism, unspecified: Secondary | ICD-10-CM

## 2023-02-06 DIAGNOSIS — W19XXXA Unspecified fall, initial encounter: Secondary | ICD-10-CM | POA: Diagnosis not present

## 2023-02-06 DIAGNOSIS — G4733 Obstructive sleep apnea (adult) (pediatric): Secondary | ICD-10-CM

## 2023-02-06 DIAGNOSIS — S72002A Fracture of unspecified part of neck of left femur, initial encounter for closed fracture: Secondary | ICD-10-CM | POA: Diagnosis not present

## 2023-02-06 DIAGNOSIS — S72142A Displaced intertrochanteric fracture of left femur, initial encounter for closed fracture: Secondary | ICD-10-CM

## 2023-02-06 DIAGNOSIS — I10 Essential (primary) hypertension: Secondary | ICD-10-CM | POA: Diagnosis not present

## 2023-02-06 DIAGNOSIS — Z8673 Personal history of transient ischemic attack (TIA), and cerebral infarction without residual deficits: Secondary | ICD-10-CM | POA: Diagnosis not present

## 2023-02-06 HISTORY — PX: INTRAMEDULLARY (IM) NAIL INTERTROCHANTERIC: SHX5875

## 2023-02-06 LAB — BASIC METABOLIC PANEL
Anion gap: 10 (ref 5–15)
BUN: 33 mg/dL — ABNORMAL HIGH (ref 8–23)
CO2: 20 mmol/L — ABNORMAL LOW (ref 22–32)
Calcium: 8.5 mg/dL — ABNORMAL LOW (ref 8.9–10.3)
Chloride: 110 mmol/L (ref 98–111)
Creatinine, Ser: 1.53 mg/dL — ABNORMAL HIGH (ref 0.44–1.00)
GFR, Estimated: 32 mL/min — ABNORMAL LOW (ref >60)
Glucose, Bld: 107 mg/dL — ABNORMAL HIGH (ref 70–99)
Potassium: 4.6 mmol/L (ref 3.5–5.1)
Sodium: 140 mmol/L (ref 135–145)

## 2023-02-06 LAB — SURGICAL PCR SCREEN
MRSA, PCR: NEGATIVE
Staphylococcus aureus: NEGATIVE

## 2023-02-06 LAB — RETICULOCYTES
Immature Retic Fract: 31.5 % — ABNORMAL HIGH (ref 2.3–15.9)
RBC.: 2.38 MIL/uL — ABNORMAL LOW (ref 3.87–5.11)
Retic Count, Absolute: 67.1 10*3/uL (ref 19.0–186.0)
Retic Ct Pct: 2.8 % (ref 0.4–3.1)

## 2023-02-06 LAB — CBC
HCT: 24.5 % — ABNORMAL LOW (ref 36.0–46.0)
Hemoglobin: 7.8 g/dL — ABNORMAL LOW (ref 12.0–15.0)
MCH: 28.7 pg (ref 26.0–34.0)
MCHC: 31.8 g/dL (ref 30.0–36.0)
MCV: 90.1 fL (ref 80.0–100.0)
Platelets: 136 10*3/uL — ABNORMAL LOW (ref 150–400)
RBC: 2.72 MIL/uL — ABNORMAL LOW (ref 3.87–5.11)
RDW: 15.3 % (ref 11.5–15.5)
WBC: 10.7 10*3/uL — ABNORMAL HIGH (ref 4.0–10.5)
nRBC: 0 % (ref 0.0–0.2)

## 2023-02-06 LAB — IRON AND TIBC
Iron: 19 ug/dL — ABNORMAL LOW (ref 28–170)
Saturation Ratios: 9 % — ABNORMAL LOW (ref 10.4–31.8)
TIBC: 210 ug/dL — ABNORMAL LOW (ref 250–450)
UIBC: 191 ug/dL

## 2023-02-06 LAB — FOLATE: Folate: 22 ng/mL (ref >5.9)

## 2023-02-06 LAB — FERRITIN: Ferritin: 162 ng/mL (ref 11–307)

## 2023-02-06 LAB — HEMOGLOBIN AND HEMATOCRIT, BLOOD
HCT: 26 % — ABNORMAL LOW (ref 36.0–46.0)
Hemoglobin: 8.3 g/dL — ABNORMAL LOW (ref 12.0–15.0)

## 2023-02-06 LAB — VITAMIN B12: Vitamin B-12: 935 pg/mL — ABNORMAL HIGH (ref 180–914)

## 2023-02-06 SURGERY — FIXATION, FRACTURE, INTERTROCHANTERIC, WITH INTRAMEDULLARY ROD
Anesthesia: General | Site: Hip | Laterality: Left

## 2023-02-06 MED ORDER — DEXAMETHASONE SODIUM PHOSPHATE 10 MG/ML IJ SOLN
INTRAMUSCULAR | Status: AC
  Filled 2023-02-06: qty 1

## 2023-02-06 MED ORDER — LACTATED RINGERS IV SOLN: INTRAVENOUS | Status: DC

## 2023-02-06 MED ORDER — 0.9 % SODIUM CHLORIDE (POUR BTL) OPTIME
TOPICAL | Status: DC | PRN
  Administered 2023-02-06: 1000 mL

## 2023-02-06 MED ORDER — TRANEXAMIC ACID-NACL 1000-0.7 MG/100ML-% IV SOLN
INTRAVENOUS | Status: AC
  Filled 2023-02-06: qty 100

## 2023-02-06 MED ORDER — OXYCODONE HCL 5 MG PO TABS
ORAL_TABLET | ORAL | Status: AC
  Administered 2023-02-06: 5 mg via ORAL
  Filled 2023-02-06: qty 1

## 2023-02-06 MED ORDER — PHENYLEPHRINE HCL-NACL 20-0.9 MG/250ML-% IV SOLN
INTRAVENOUS | Status: DC | PRN
  Administered 2023-02-06: 50 ug/min via INTRAVENOUS

## 2023-02-06 MED ORDER — PROPOFOL 10 MG/ML IV BOLUS
INTRAVENOUS | Status: DC | PRN
  Administered 2023-02-06: 70 mg via INTRAVENOUS

## 2023-02-06 MED ORDER — LACTATED RINGERS IV SOLN: INTRAVENOUS | Status: DC | PRN

## 2023-02-06 MED ORDER — OXYCODONE HCL 5 MG PO TABS: 5.0000 mg | ORAL_TABLET | Freq: Once | ORAL | Status: AC

## 2023-02-06 MED ORDER — FENTANYL CITRATE (PF) 100 MCG/2ML IJ SOLN
INTRAMUSCULAR | Status: AC
  Administered 2023-02-06: 25 ug via INTRAVENOUS
  Filled 2023-02-06: qty 2

## 2023-02-06 MED ORDER — FENTANYL CITRATE (PF) 250 MCG/5ML IJ SOLN
INTRAMUSCULAR | Status: AC
  Filled 2023-02-06: qty 5

## 2023-02-06 MED ORDER — CEFAZOLIN SODIUM-DEXTROSE 2-4 GM/100ML-% IV SOLN
INTRAVENOUS | Status: AC
  Filled 2023-02-06: qty 100

## 2023-02-06 MED ORDER — TRANEXAMIC ACID-NACL 1000-0.7 MG/100ML-% IV SOLN: 1000.0000 mg | INTRAVENOUS | Status: DC

## 2023-02-06 MED ORDER — ALBUMIN HUMAN 5 % IV SOLN: INTRAVENOUS | Status: DC | PRN

## 2023-02-06 MED ORDER — BUPIVACAINE HCL (PF) 0.25 % IJ SOLN
INTRAMUSCULAR | Status: AC
  Filled 2023-02-06: qty 30

## 2023-02-06 MED ORDER — BUPIVACAINE HCL (PF) 0.25 % IJ SOLN
INTRAMUSCULAR | Status: DC | PRN
  Administered 2023-02-06: 30 mL

## 2023-02-06 MED ORDER — PROPOFOL 10 MG/ML IV BOLUS
INTRAVENOUS | Status: AC
  Filled 2023-02-06: qty 20

## 2023-02-06 MED ORDER — EPHEDRINE SULFATE-NACL 50-0.9 MG/10ML-% IV SOSY
PREFILLED_SYRINGE | INTRAVENOUS | Status: DC | PRN
  Administered 2023-02-06 (×2): 5 mg via INTRAVENOUS

## 2023-02-06 MED ORDER — PHENYLEPHRINE 80 MCG/ML (10ML) SYRINGE FOR IV PUSH (FOR BLOOD PRESSURE SUPPORT)
PREFILLED_SYRINGE | INTRAVENOUS | Status: DC | PRN
  Administered 2023-02-06 (×3): 160 ug via INTRAVENOUS

## 2023-02-06 MED ORDER — FENTANYL CITRATE (PF) 250 MCG/5ML IJ SOLN
INTRAMUSCULAR | Status: DC | PRN
  Administered 2023-02-06 (×2): 25 ug via INTRAVENOUS
  Administered 2023-02-06 (×2): 50 ug via INTRAVENOUS

## 2023-02-06 MED ORDER — LIDOCAINE 2% (20 MG/ML) 5 ML SYRINGE
INTRAMUSCULAR | Status: DC | PRN
  Administered 2023-02-06: 40 mg via INTRAVENOUS

## 2023-02-06 MED ORDER — CEFAZOLIN SODIUM-DEXTROSE 1-4 GM/50ML-% IV SOLN
1.0000 g | Freq: Three times a day (TID) | INTRAVENOUS | Status: AC
  Administered 2023-02-06 – 2023-02-07 (×3): 1 g via INTRAVENOUS
  Filled 2023-02-06 (×3): qty 50

## 2023-02-06 MED ORDER — CEFAZOLIN SODIUM-DEXTROSE 2-3 GM-%(50ML) IV SOLR
INTRAVENOUS | Status: DC | PRN
  Administered 2023-02-06: 2 g via INTRAVENOUS

## 2023-02-06 MED ORDER — CHLORHEXIDINE GLUCONATE 0.12 % MT SOLN
OROMUCOSAL | Status: AC
  Administered 2023-02-06: 15 mL via OROMUCOSAL
  Filled 2023-02-06: qty 15

## 2023-02-06 MED ORDER — ORAL CARE MOUTH RINSE: 15.0000 mL | Freq: Once | OROMUCOSAL | Status: AC

## 2023-02-06 MED ORDER — LACTATED RINGERS IV SOLN: INTRAVENOUS | Status: AC

## 2023-02-06 MED ORDER — FENTANYL CITRATE (PF) 100 MCG/2ML IJ SOLN
25.0000 ug | INTRAMUSCULAR | Status: DC | PRN
  Administered 2023-02-06: 25 ug via INTRAVENOUS

## 2023-02-06 MED ORDER — SUGAMMADEX SODIUM 200 MG/2ML IV SOLN
INTRAVENOUS | Status: DC | PRN
  Administered 2023-02-06: 200 mg via INTRAVENOUS

## 2023-02-06 MED ORDER — TRANEXAMIC ACID-NACL 1000-0.7 MG/100ML-% IV SOLN
INTRAVENOUS | Status: DC | PRN
  Administered 2023-02-06: 1000 mg via INTRAVENOUS

## 2023-02-06 MED ORDER — ONDANSETRON HCL 4 MG/2ML IJ SOLN
INTRAMUSCULAR | Status: DC | PRN
  Administered 2023-02-06: 4 mg via INTRAVENOUS

## 2023-02-06 MED ORDER — CHLORHEXIDINE GLUCONATE 0.12 % MT SOLN: 15.0000 mL | Freq: Once | OROMUCOSAL | Status: AC

## 2023-02-06 MED ORDER — ONDANSETRON HCL 4 MG/2ML IJ SOLN
INTRAMUSCULAR | Status: AC
  Filled 2023-02-06: qty 2

## 2023-02-06 MED ORDER — CEFAZOLIN SODIUM-DEXTROSE 1-4 GM/50ML-% IV SOLN: 1.0000 g | Freq: Once | INTRAVENOUS | Status: DC

## 2023-02-06 MED ORDER — ROCURONIUM BROMIDE 10 MG/ML (PF) SYRINGE
PREFILLED_SYRINGE | INTRAVENOUS | Status: DC | PRN
  Administered 2023-02-06: 50 mg via INTRAVENOUS

## 2023-02-06 SURGICAL SUPPLY — 41 items
ALCOHOL 70% 16 OZ (MISCELLANEOUS) ×2 IMPLANT
BAG COUNTER SPONGE SURGICOUNT (BAG) ×2 IMPLANT
BIT DRILL INTERTAN LAG SCREW (BIT) IMPLANT
BIT DRILL SHORT 4.0 (BIT) IMPLANT
BNDG COHESIVE 6X5 TAN ST LF (GAUZE/BANDAGES/DRESSINGS) ×4 IMPLANT
CANISTER SUCT 3000ML PPV (MISCELLANEOUS) ×2 IMPLANT
COVER PERINEAL POST (MISCELLANEOUS) ×2 IMPLANT
COVER SURGICAL LIGHT HANDLE (MISCELLANEOUS) ×2 IMPLANT
DRAPE C-ARM 42X72 X-RAY (DRAPES) ×2 IMPLANT
DRAPE HALF SHEET 40X57 (DRAPES) IMPLANT
DRAPE INCISE IOBAN 66X45 STRL (DRAPES) ×2 IMPLANT
DRAPE STERI IOBAN 125X83 (DRAPES) ×2 IMPLANT
DRSG ADAPTIC 3X8 NADH LF (GAUZE/BANDAGES/DRESSINGS) ×2 IMPLANT
DRSG TEGADERM 4X4.75 (GAUZE/BANDAGES/DRESSINGS) IMPLANT
DRSG XEROFORM 1X8 (GAUZE/BANDAGES/DRESSINGS) IMPLANT
DURAPREP 26ML APPLICATOR (WOUND CARE) ×2 IMPLANT
ELECT CAUTERY BLADE 6.4 (BLADE) ×2 IMPLANT
ELECT REM PT RETURN 9FT ADLT (ELECTROSURGICAL) ×1 IMPLANT
ELECTRODE REM PT RTRN 9FT ADLT (ELECTROSURGICAL) ×2 IMPLANT
GAUZE SPONGE 4X4 12PLY STRL (GAUZE/BANDAGES/DRESSINGS) IMPLANT
GAUZE SPONGE 4X4 12PLY STRL LF (GAUZE/BANDAGES/DRESSINGS) ×2 IMPLANT
GLOVE BIO SURGEON STRL SZ7.5 (GLOVE) ×4 IMPLANT
GLOVE BIOGEL PI IND STRL 8 (GLOVE) ×4 IMPLANT
GOWN STRL REUS W/ TWL LRG LVL3 (GOWN DISPOSABLE) ×2 IMPLANT
GUIDE PIN 3.2X343 (PIN) ×2 IMPLANT
GUIDE ROD 3.0 (MISCELLANEOUS) ×1 IMPLANT
KIT BASIN OR (CUSTOM PROCEDURE TRAY) ×2 IMPLANT
KIT TURNOVER KIT B (KITS) ×2 IMPLANT
NAIL IM ANGLED 11.5X380 130D (Nail) IMPLANT
NS IRRIG 1000ML POUR BTL (IV SOLUTION) ×2 IMPLANT
PACK GENERAL/GYN (CUSTOM PROCEDURE TRAY) ×2 IMPLANT
PAD ARMBOARD 7.5X6 YLW CONV (MISCELLANEOUS) ×4 IMPLANT
PIN GUIDE 3.2X343MM (PIN) IMPLANT
ROD GUIDE 3.0 (MISCELLANEOUS) IMPLANT
SCREW LAG COMPR KIT 100/95 (Screw) IMPLANT
SCREW TRIGEN LOW PROF 5.0X35 (Screw) IMPLANT
STAPLER VISISTAT 35W (STAPLE) ×2 IMPLANT
SUT MON AB 2-0 CT1 36 (SUTURE) ×2 IMPLANT
TOWEL GREEN STERILE (TOWEL DISPOSABLE) ×2 IMPLANT
TOWEL GREEN STERILE FF (TOWEL DISPOSABLE) ×2 IMPLANT
WATER STERILE IRR 1000ML POUR (IV SOLUTION) ×2 IMPLANT

## 2023-02-06 NOTE — Op Note (Signed)
.Orthopaedic Surgery Operative Note (CSN: [id number])  Sharon Bailey  [birth year] Date of Surgery: 02/06/2023   Diagnoses:  Left hip reverse obliquity intertrochanteric hip fracture with subtrochanteric extension  Procedure: Left femur cephalomedullary nailing   Operative Finding Successful completion of the planned procedure.    Post-operative plan: The patient will be bearing as tolerated on left leg.  DVT prophylaxis Lovenox 40 mg/day until mobilizing and then consider transition in clinic to alternative medicines.  Ancef 1 g every 8 hours for 3 doses for surgical prophylaxis. Pain control with PRN pain medication preferring oral medicines.  Follow up plan will be scheduled in approximately 14 days for incision check and XR.   Post-Op Diagnosis: Same Surgeons:Primary: Luci Bank, MD Assistants:None Location: [facility] OR ROOM 05 Anesthesia: General with local anesthesia Antibiotics: Ancef 2 g Tourniquet time: N/A Estimated Blood Loss: 200 cc Complications: Skin tear of distal lateral thigh while removing drapes Specimens: None Implants: Implant Name Type Inv. Item Serial No. Manufacturer Lot No. LRB No. Used Action  11.5MM x 38 CM 130deg Left Trigen Intertan 1.5 Nail [id number] Nail   [employer] [id number] Left 1 Implanted  SCREW LAG COMPR KIT 100/95 - [id number] Screw SCREW LAG COMPR KIT 100/95  [employer] [id number] Left 1 Implanted  SCREW TRIGEN LOW PROF 5.0X35 - [id number] Screw SCREW TRIGEN LOW PROF 5.0X35  [employer] [id number] Left 1 Implanted    Indications for Surgery:   Sharon Bailey is a 89 y.o. female with left hip reverese obliquity intertrochanteric fracture with subtrochanteric extension after a mechanical fall yesterday.  She was indicated for a left hip cephalomedullary nail   Risks and benefit of surgery discussed with patient and family all questions answered. These risks include but are not limited to infection,  bleeding, damage to neurovascular structures, failure to heal, nonunion, malunion, failure of hardware, prominent hardware, need for additional surgery, DVT and cardiopulmonary complications from anesthesia. The patient understands these risks and wished to proceed with surgery      Procedure:                  The patient was identified properly. Informed consent was obtained and the surgical site was marked. The patient was taken up to suite where general anesthesia was induced.The patient was placed supine on a fracture table and appropriate reduction was obtained and visualized on fluoroscopy prior to the beginning of the procedure.  We made an incision proximal to the greater trochanter and dissected down through the fascia.  We then carefully placed our starting guidewire localizing under fluoroscopy prior to advancing the wire into the bone to appropriate depth. Entry reamer was used to open the canal. We were able to pass a guidewire down the length of the intramedullary canal.  We verified that our position was appropriate at the knee and measured and selected a length of nail noted above.  We reamed sequentially to a size 13 mm to allow an 11.5 mm diameter nail.    At this point we placed our nail localizing under fluoroscopy that it was at the appropriate level prior to using the outrigger device to pass a wire and then the cephalo-medullary screws.  The screw was locked proximally to avoid over collapse.  We took final shots at the proximal femur. A single 5.0 mm distal interlock screw was placed with perfect circle technique.  Final pictures were obtained.   The wounds were thoroughly irrigated closed in a  multilayer fashion with absorable sutures and skin staples.  Incisions were infiltrated with 0.25% Marcaine. While removing the drapes there was a skin tear at the knee measuring approximately 5x5 cm. A sterile dressing was placed.  The patient was awoken from general anesthesia and taken to  the PACU in stable condition without complication.

## 2023-02-06 NOTE — Anesthesia Procedure Notes (Signed)
Procedure Name: Intubation Date/Time: 02/06/2023 9:51 AM  Performed by: Dairl Ponder, CRNAPre-anesthesia Checklist: Patient identified, Emergency Drugs available, Suction available and Patient being monitored Patient Re-evaluated:Patient Re-evaluated prior to induction Oxygen Delivery Method: Circle System Utilized Preoxygenation: Pre-oxygenation with 100% oxygen Induction Type: IV induction Ventilation: Mask ventilation without difficulty Laryngoscope Size: Mac and 3 Grade View: Grade II Tube type: Oral Tube size: 7.0 mm Number of attempts: 1 Airway Equipment and Method: Stylet and Oral airway Placement Confirmation: ETT inserted through vocal cords under direct vision, positive ETCO2 and breath sounds checked- equal and bilateral Secured at: 22 cm Tube secured with: Tape Dental Injury: Teeth and Oropharynx as per pre-operative assessment

## 2023-02-06 NOTE — Consult Note (Signed)
ORTHOPAEDIC CONSULTATION  REQUESTING PHYSICIAN: Rai, Delene Ruffini, MD  ASSESSMENT AND PLAN: 89 y.o. female with the following: Left Hip reverse obliquity Intertrochanteric femur fracture  This patient requires inpatient admission to the hospitalist, to include preoperative clearance and perioperative medical management  - Weight Bearing Status/Activity: NWB Left lower extremity  - Additional recommended labs/tests: none  -VTE Prophylaxis: Please hold prior to OR; to resume POD#1 at the discretion of the primary team  - Pain control: Recommend PO pain medications PRN; judicious use of narcotics  -Procedures: Plan for OR once patient has been medically optimized  Plan for Left hip Cephalomedullary nail  The risk and benefits of surgical intervention were discussed with patient and family.  These risks include but are not limited to infection, bleeding, damage to neurovascular structures, failure to heal, malunion, nonunion, hardware failure, prominent hardware, need for additional surgery, DVT and cardiopulmonary complications from anesthesia.  The patient understands these risks and wished proceed with surgery.  Chief Complaint: Left hip pain  HPI: Sharon Bailey is a 89 y.o. female who presented to the ED for evaluation after sustaining a mechanical fall at her doctor's appointment.  She states she fell backwards in the bathroom and landed on her hip.  Unable to ambulate after..  She is complaining of Left hip pain.  Questionable loss of consciousness.  Denies any numbness or tingling.  Past Medical History:  Diagnosis Date   Abnormal EKG    Acute CVA (cerebrovascular accident) (HCC) 06/11/2019   AKI (acute kidney injury) (HCC) 06/11/2019   Collagenous colitis    Collagenous colitis    Diverticulitis    Diverticulosis    Fluttering heart    GERD (gastroesophageal reflux disease)    Gout    Gout attack 09/28/2012   Hammer toe of left foot 09/28/2012   Hyperlipidemia     Hypertension    Hypertensive emergency    Hypothyroidism    Irregular heart beat    Pain in joint, ankle and foot 09/28/2012   Status post foot surgery 10/11/2012   Thyroid disease    Past Surgical History:  Procedure Laterality Date   ABDOMINAL HYSTERECTOMY     ADENOIDECTOMY     CHOLECYSTECTOMY     Excision Ganglion Toe Left 09/29/2012   Lt #2 @ [facility]   Hammer toe repair Left 09/29/2012   Lt #2 @ [facility]   TONSILLECTOMY     Social History   Socioeconomic History   Marital status: Widowed    Spouse name: Not on file   Number of children: 3   Years of education: 47   Highest education level: 12th grade  Occupational History   Not on file  Tobacco Use   Smoking status: Never   Smokeless tobacco: Never  Vaping Use   Vaping status: Never Used  Substance and Sexual Activity   Alcohol use: No    Alcohol/week: 0.0 standard drinks of alcohol   Drug use: No   Sexual activity: Not Currently  Other Topics Concern   Not on file  Social History Narrative   Not on file   Social Drivers of Health   Financial Resource Strain: Not on file  Food Insecurity: No Food Insecurity (02/05/2023)   Hunger Vital Sign    Worried About Running Out of Food in the Last Year: Never true    Ran Out of Food in the Last Year: Never true  Transportation Needs: No Transportation Needs (02/05/2023)   PRAPARE - Transportation    Lack  of Transportation (Medical): No    Lack of Transportation (Non-Medical): No  Physical Activity: Not on file  Stress: Not on file  Social Connections: Moderately Integrated (02/05/2023)   Social Connection and Isolation Panel [NHANES]    Frequency of Communication with Friends and Family: More than three times a week    Frequency of Social Gatherings with Friends and Family: More than three times a week    Attends Religious Services: More than 4 times per year    Active Member of [employer] or Organizations: Yes    Attends Banker Meetings: More than 4 times per year     Marital Status: Widowed   Family History  Problem Relation Age of Onset   Breast cancer Mother    Memory loss Father    Heart disease Sister    Rheumatic fever Sister    Lung disease Brother    Colon cancer Neg Hx    Allergies  Allergen Reactions   Bactrim [Sulfamethoxazole-Trimethoprim] Diarrhea and Nausea Only   Cipro [Ciprofloxacin Hcl] Hives   Prior to Admission medications   Medication Sig Start Date End Date Taking? Authorizing Provider  allopurinol (ZYLOPRIM) 300 MG tablet Take 300 mg by mouth daily.   Yes [provider]  amLODipine (NORVASC) 5 MG tablet TAKE 1 TABLET (5 MG TOTAL) BY MOUTH DAILY. 10/22/22  Yes Jodelle Red, MD  aspirin EC 81 MG tablet Take 81 mg by mouth daily. Swallow whole.   Yes [provider]  budesonide (ENTOCORT EC) 3 MG 24 hr capsule TAKE 1 CAPSULE EVERY DAY AS DIRECTED 03/04/22  Yes Hilarie Fredrickson, MD  ferrous sulfate 325 (65 FE) MG EC tablet Take 325 mg by mouth daily with breakfast.   Yes [provider]  ibuprofen (ADVIL) 200 MG tablet Take 400 mg by mouth daily as needed for headache or mild pain (pain score 1-3).   Yes [provider]  levothyroxine (SYNTHROID) 88 MCG tablet Take 88 mcg by mouth daily.   Yes [provider]  simvastatin (ZOCOR) 40 MG tablet Take 40 mg by mouth daily.   Yes [provider]  traMADol (ULTRAM) 50 MG tablet Take 50 mg by mouth daily as needed for severe pain (pain score 7-10).   Yes [provider]  valsartan (DIOVAN) 160 MG tablet TAKE 1 TABLET EVERY DAY 12/09/22  Yes Jodelle Red, MD  vitamin B-12 1000 MCG tablet Take 1 tablet (1,000 mcg total) by mouth daily. 06/14/19  Yes Joseph Art, DO    Family History Reviewed and non-contributory, no pertinent history of problems with bleeding or anesthesia    Review of Systems No fevers or chills No numbness or tingling No chest pain No shortness of breath No bowel or bladder  dysfunction No GI distress No headaches   OBJECTIVE  Vitals:Patient Vitals for the past 8 hrs:  BP Temp Temp src Pulse Resp SpO2  02/06/23 0600 (!) 154/52 98.1 F (36.7 C) Oral 86 17 98 %  02/06/23 0326 (!) 155/68 98 F (36.7 C) Oral 81 -- 96 %   General: Alert, no acute distress Cardiovascular: Extremities are warm Respiratory: No cyanosis, no use of accessory musculature Skin: No lesions in the area of chief complaint  Neurologic: Sensation intact distally  Psychiatric: Patient is competent for consent with normal mood and affect Lymphatic: No swelling obvious and reported other than the area involved in the exam below Extremities  Left LE: Extremity held in a fixed position.  ROM deferred  due to known fracture.  Sensation is intact distally in the sural, saphenous, DP, SP, and plantar nerve distribution. 2+ DP pulse.  Toes are WWP.  Active motion intact in the TA/EHL/GS. Right LE: Sensation is intact distally in the sural, saphenous, DP, SP, and plantar nerve distribution. 2+ DP pulse.  Toes are WWP.  Active motion intact in the TA/EHL/GS. Tolerates gentle ROM of the hip.  No pain with axial loading.     Test Results Imaging CT HEAD WO CONTRAST ( ) Result Date: 02/05/2023 CLINICAL DATA:  Head trauma EXAM: CT HEAD WITHOUT CONTRAST TECHNIQUE: Contiguous axial images were obtained from the base of the skull through the vertex without intravenous contrast. RADIATION DOSE REDUCTION: This exam was performed according to the departmental dose-optimization program which includes automated exposure control, adjustment of the mA and/or kV according to patient size and/or use of iterative reconstruction technique. COMPARISON:  10/23/2019 CT head FINDINGS: Brain: No evidence of acute infarction, hemorrhage, mass, mass effect, or midline shift. No hydrocephalus or extra-axial fluid collection. Age related cerebral atrophy. Periventricular white matter changes, likely the sequela of chronic small  vessel ischemic disease. Vascular: No hyperdense vessel. Atherosclerotic calcifications in the intracranial carotid and vertebral arteries. Skull: Negative for fracture or focal lesion. Sinuses/Orbits: Mucosal thickening in the right sphenoid sinus and posterior left ethmoid air cells. Status post bilateral lens replacements. Other: The mastoid air cells are well aerated. IMPRESSION: No acute intracranial process. Electronically Signed   By: Wiliam Ke M.D.   On: 02/05/2023 15:46   DG Chest 1 View Result Date: 02/05/2023 CLINICAL DATA:  Fall. EXAM: CHEST  1 VIEW COMPARISON:  06/11/2019. FINDINGS: Bilateral lung fields are clear. Bilateral costophrenic angles are clear. Stable cardio-mediastinal silhouette. No acute osseous abnormalities. The soft tissues are within normal limits. IMPRESSION: No active disease. Electronically Signed   By: Jules Schick M.D.   On: 02/05/2023 14:52   DG Femur Min 2 Views Left Result Date: 02/05/2023 CLINICAL DATA:  Fall.  Suspected hip fracture. EXAM: LEFT FEMUR 2 VIEWS; PELVIS - 1-2 VIEW COMPARISON:  None Available. FINDINGS: There is comminuted and angulated fracture of intertrochanteric left proximal femur. No other acute fracture or dislocation. No aggressive osseous lesion. Visualized sacral arcuate lines are unremarkable. Unremarkable symphysis pubis. There are mild degenerative changes of bilateral hip joints without significant joint space narrowing. Osteophytosis of the superior acetabulum. Patient is status post left knee arthroplasty. No radiopaque foreign bodies. IMPRESSION: *Comminuted and angulated fracture of the intertrochanteric left femur. Electronically Signed   By: Jules Schick M.D.   On: 02/05/2023 14:52   DG Pelvis 1-2 Views Result Date: 02/05/2023 CLINICAL DATA:  Fall.  Suspected hip fracture. EXAM: LEFT FEMUR 2 VIEWS; PELVIS - 1-2 VIEW COMPARISON:  None Available. FINDINGS: There is comminuted and angulated fracture of intertrochanteric left  proximal femur. No other acute fracture or dislocation. No aggressive osseous lesion. Visualized sacral arcuate lines are unremarkable. Unremarkable symphysis pubis. There are mild degenerative changes of bilateral hip joints without significant joint space narrowing. Osteophytosis of the superior acetabulum. Patient is status post left knee arthroplasty. No radiopaque foreign bodies. IMPRESSION: *Comminuted and angulated fracture of the intertrochanteric left femur. Electronically Signed   By: Jules Schick M.D.   On: 02/05/2023 14:52   Labs cbc Recent Labs    02/05/23 1353 02/06/23 0502  WBC 9.9 10.7*  HGB 12.0 7.8*  HCT 37.8 24.5*  PLT 162 136*    Labs inflam No results for input(s): "CRP" in the last  72 hours.  Invalid input(s): "ESR"  Labs coag No results for input(s): "INR", "PTT" in the last 72 hours.  Invalid input(s): "PT"  Recent Labs    02/05/23 1353 02/06/23 0502  NA 142 140  K 3.8 4.6  CL 113* 110  CO2 22 20*  GLUCOSE 123* 107*  BUN 23 33*  CREATININE 1.35* 1.53*  CALCIUM 9.1 8.5*

## 2023-02-06 NOTE — Transfer of Care (Signed)
Immediate Anesthesia Transfer of Care Note  Patient: Sharon Bailey  Procedure(s) Performed: INTRAMEDULLARY (IM) NAIL INTERTROCHANTERIC (Left: Hip)  Patient Location: PACU  Anesthesia Type:General  Level of Consciousness: awake and patient cooperative  Airway & Oxygen Therapy: Patient Spontanous Breathing and Patient connected to nasal cannula oxygen  Post-op Assessment: Report given to RN and Post -op Vital signs reviewed and stable  Post vital signs: Reviewed and stable  Last Vitals:  Vitals Value Taken Time  BP 108/53 02/06/23 1130  Temp    Pulse 97 02/06/23 1132  Resp 20 02/06/23 1132  SpO2 96 % 02/06/23 1132  Vitals shown include unfiled device data.  Last Pain:  Vitals:   02/06/23 0826  TempSrc: Oral  PainSc:          Complications: No notable events documented.

## 2023-02-06 NOTE — Anesthesia Postprocedure Evaluation (Signed)
Anesthesia Post Note  Patient: CADIE SORCI  Procedure(s) Performed: INTRAMEDULLARY (IM) NAIL INTERTROCHANTERIC (Left: Hip)     Patient location during evaluation: PACU Anesthesia Type: General Level of consciousness: awake and alert Pain management: pain level controlled Vital Signs Assessment: post-procedure vital signs reviewed and stable Respiratory status: spontaneous breathing, nonlabored ventilation, respiratory function stable and patient connected to nasal cannula oxygen Cardiovascular status: blood pressure returned to baseline and stable Postop Assessment: no apparent nausea or vomiting Anesthetic complications: no   No notable events documented.  Last Vitals:  Vitals:   02/06/23 1233 02/06/23 1252  BP: (!) 93/38 (!) 106/45  Pulse: 81 83  Resp: 17 18  Temp: 36.6 C 36.8 C  SpO2: 96% 97%    Last Pain:  Vitals:   02/06/23 1427  TempSrc:   PainSc: 5                  Gwenivere Hiraldo P Kharon Hixon

## 2023-02-06 NOTE — Progress Notes (Signed)
[facility]                                                                              Dan Dissinger, is a 89 y.o. female, DOB - [birth year], ZOX:[id number] Admit date - 02/05/2023    Outpatient Primary MD for the patient is Irven Coe, MD  LOS - 1  days  Chief Complaint  Patient presents with   Fall       Brief summary   Patient is a 89 year old female with HTN, HLP, CVA, CKD 3B, anemia, gout, hypothyroidism, chronic low back pain, OSA, lymphocytic colitis presented with mechanical fall.  Patient was at her doctor's appointment, went to the bathroom, fell backwards and landed on her left hip.  She was unable to walk afterwards due to pain.  She was down for about an hour EMS arrived, noted left leg was shortened and rotated. Chest x-ray showed no abnormality. CT head showed no acute intracranial process Left hip x-ray showed comminuted and angulated fracture of intertrochanteric left femur Orthopedics consulted, patient was admitted  Assessment & Plan    Principal Problem:   Closed comminuted and angulated intertrochanteric fracture of left femur, (HCC) -Seen this morning, n.p.o., orthopedics consulted -Plan for surgery today -Noted hemoglobin 7.8, repeated H&H again, 8.3.,  No ongoing bleeding issues.  Hemoglobin 12.0 on 02/05/2023 -Pain control, DVT prophylaxis per orthopedics   Active Problems: Normocytic anemia, acute on chronic -Baseline hemoglobin~11 -Presented with hemoglobin of 12.0 on admission, this morning noted to be 7.8 -Repeat H&H showed hemoglobin 8.3/26.0 -Will obtain anemia panel, transfuse packed RBCs if Hb <8  Mild acute on chronic kidney disease stage IIIb -Baseline creatinine has been ~1.3 -Creatinine 1.53 this morning, hold Avapro, gentle hydration until back on diet -Follow H&H, may need to transfuse if Hb <8    Essential hypertension -BP currently stable, hold Avapro -Continue amlodipine    Hyperlipidemia -Continue  Lipitor  Gout - Continue home allopurinol   Hypothyroidism - Continue on Synthroid   Chronic low back pain -Patient will be receiving pain regimen with left femur fracture   OSA - Not currently on CPAP   Lymphocytic colitis - Continue home p.o. budesonide    Estimated body mass index is 25.61 kg/m as calculated from the following:   Height as of this encounter: 5' 4.02" (1.626 m).   Weight as of this encounter: 67.7 kg.  Code Status: Full code DVT Prophylaxis:  SCDs Start: 02/05/23 1617   Level of Care: Level of care: Med-Surg Family Communication: Updated patient's 3 daughters at the bedside Disposition Plan:      Remains inpatient appropriate:      Procedures:    Consultants:   Orthopedics  Antimicrobials:   Anti-infectives (From admission, onward)    Start     Dose/Rate Route Frequency Ordered Stop   02/06/23 0855  ceFAZolin (ANCEF) 2-4 GM/100ML-% IVPB       Note to Pharmacy: Crissie Sickles: cabinet override      02/06/23 0855 02/06/23 2059          Medications  [MAR Hold] allopurinol  300 mg Oral Daily   [MAR Hold]  amLODipine  5 mg Oral Daily   [MAR Hold] aspirin EC  81 mg Oral Daily   [MAR Hold] atorvastatin  20 mg Oral Daily   [MAR Hold] budesonide  6 mg Oral Daily   [MAR Hold] fentaNYL (SUBLIMAZE) injection  25 mcg Intravenous Once   [MAR Hold] levothyroxine  88 mcg Oral Daily      Subjective:   Sharon Bailey was seen and examined today.  Seen this morning prior to the surgery, no acute complaints.  Hip pain controlled with pain medications.  No ongoing nausea vomiting, abdominal pain, hematemesis, hematochezia or melena.  No bleeding issues.   Objective:   Vitals:   02/06/23 0600 02/06/23 0736 02/06/23 0822 02/06/23 0826  BP: (!) 154/52 131/68  125/84  Pulse: 86 92  92  Resp: 17 16  (!) 22  Temp: 98.1 F (36.7 C) 98.6 F (37 C)  (!) 97.5 F (36.4 C)  TempSrc: Oral Oral  Oral  SpO2: 98%   95%  Weight:   67.7 kg   Height:   5'  4.02" (1.626 m)     Intake/Output Summary (Last 24 hours) at 02/06/2023 1055 Last data filed at 02/06/2023 1030 Gross per 24 hour  Intake 250 ml  Output --  Net 250 ml     Wt Readings from Last 3 Encounters:  02/06/23 67.7 kg  11/06/22 65.1 kg  10/20/22 70.2 kg     Exam General: Alert and oriented x 3, NAD Cardiovascular: S1 S2 auscultated,  RRR Respiratory: Clear to auscultation bilaterally, no wheezing Gastrointestinal: Soft, nontender, nondistended, + bowel sounds Ext: no pedal edema bilaterally Neuro: no new deficits Skin: Ecchymosis and redness, noted on the chest (per daughters, chronic, scratch marks) Psych: Normal affect, pleasant    Data Reviewed:  I have personally reviewed following labs    CBC Lab Results  Component Value Date   WBC 10.7 (H) 02/06/2023   RBC 2.72 (L) 02/06/2023   HGB 8.3 (L) 02/06/2023   HCT 26.0 (L) 02/06/2023   MCV 90.1 02/06/2023   MCH 28.7 02/06/2023   PLT 136 (L) 02/06/2023   MCHC 31.8 02/06/2023   RDW 15.3 02/06/2023   LYMPHSABS 1.5 02/05/2023   MONOABS 0.6 02/05/2023   EOSABS 0.6 (H) 02/05/2023   BASOSABS 0.1 02/05/2023     Last metabolic panel Lab Results  Component Value Date   NA 140 02/06/2023   K 4.6 02/06/2023   CL 110 02/06/2023   CO2 20 (L) 02/06/2023   BUN 33 (H) 02/06/2023   CREATININE 1.53 (H) 02/06/2023   GLUCOSE 107 (H) 02/06/2023   GFRNONAA 32 (L) 02/06/2023   GFRAA 47 (L) 07/05/2019   CALCIUM 8.5 (L) 02/06/2023   PROT 6.5 11/06/2022   ALBUMIN 3.7 11/06/2022   LABGLOB 2.8 03/23/2022   BILITOT 0.7 11/06/2022   ALKPHOS 63 11/06/2022   AST 18 11/06/2022   ALT 14 11/06/2022   ANIONGAP 10 02/06/2023    CBG (last 3)  Recent Labs    02/05/23 2106  GLUCAP 181*      Coagulation Profile: No results for input(s): "INR", "PROTIME" in the last 168 hours.   Radiology Studies: I have personally reviewed the imaging studies  CT HEAD WO CONTRAST ( ) Result Date: 02/05/2023 CLINICAL DATA:  Head  trauma EXAM: CT HEAD WITHOUT CONTRAST TECHNIQUE: Contiguous axial images were obtained from the base of the skull through the vertex without intravenous contrast. RADIATION DOSE REDUCTION: This exam was performed according to the departmental dose-optimization program  which includes automated exposure control, adjustment of the mA and/or kV according to patient size and/or use of iterative reconstruction technique. COMPARISON:  10/23/2019 CT head FINDINGS: Brain: No evidence of acute infarction, hemorrhage, mass, mass effect, or midline shift. No hydrocephalus or extra-axial fluid collection. Age related cerebral atrophy. Periventricular white matter changes, likely the sequela of chronic small vessel ischemic disease. Vascular: No hyperdense vessel. Atherosclerotic calcifications in the intracranial carotid and vertebral arteries. Skull: Negative for fracture or focal lesion. Sinuses/Orbits: Mucosal thickening in the right sphenoid sinus and posterior left ethmoid air cells. Status post bilateral lens replacements. Other: The mastoid air cells are well aerated. IMPRESSION: No acute intracranial process. Electronically Signed   By: Wiliam Ke M.D.   On: 02/05/2023 15:46   DG Chest 1 View Result Date: 02/05/2023 CLINICAL DATA:  Fall. EXAM: CHEST  1 VIEW COMPARISON:  06/11/2019. FINDINGS: Bilateral lung fields are clear. Bilateral costophrenic angles are clear. Stable cardio-mediastinal silhouette. No acute osseous abnormalities. The soft tissues are within normal limits. IMPRESSION: No active disease. Electronically Signed   By: Jules Schick M.D.   On: 02/05/2023 14:52   DG Femur Min 2 Views Left Result Date: 02/05/2023 CLINICAL DATA:  Fall.  Suspected hip fracture. EXAM: LEFT FEMUR 2 VIEWS; PELVIS - 1-2 VIEW COMPARISON:  None Available. FINDINGS: There is comminuted and angulated fracture of intertrochanteric left proximal femur. No other acute fracture or dislocation. No aggressive osseous lesion.  Visualized sacral arcuate lines are unremarkable. Unremarkable symphysis pubis. There are mild degenerative changes of bilateral hip joints without significant joint space narrowing. Osteophytosis of the superior acetabulum. Patient is status post left knee arthroplasty. No radiopaque foreign bodies. IMPRESSION: *Comminuted and angulated fracture of the intertrochanteric left femur. Electronically Signed   By: Jules Schick M.D.   On: 02/05/2023 14:52   DG Pelvis 1-2 Views Result Date: 02/05/2023 CLINICAL DATA:  Fall.  Suspected hip fracture. EXAM: LEFT FEMUR 2 VIEWS; PELVIS - 1-2 VIEW COMPARISON:  None Available. FINDINGS: There is comminuted and angulated fracture of intertrochanteric left proximal femur. No other acute fracture or dislocation. No aggressive osseous lesion. Visualized sacral arcuate lines are unremarkable. Unremarkable symphysis pubis. There are mild degenerative changes of bilateral hip joints without significant joint space narrowing. Osteophytosis of the superior acetabulum. Patient is status post left knee arthroplasty. No radiopaque foreign bodies. IMPRESSION: *Comminuted and angulated fracture of the intertrochanteric left femur. Electronically Signed   By: Jules Schick M.D.   On: 02/05/2023 14:52       Naiah Donahoe M.D. [facility] 02/06/2023, 10:55 AM  Available via Epic secure chat 7am-7pm After 7 pm, please refer to night coverage provider listed on amion.

## 2023-02-07 DIAGNOSIS — N1831 Chronic kidney disease, stage 3a: Secondary | ICD-10-CM | POA: Diagnosis not present

## 2023-02-07 DIAGNOSIS — S72142A Displaced intertrochanteric fracture of left femur, initial encounter for closed fracture: Secondary | ICD-10-CM

## 2023-02-07 DIAGNOSIS — S72002A Fracture of unspecified part of neck of left femur, initial encounter for closed fracture: Secondary | ICD-10-CM

## 2023-02-07 DIAGNOSIS — Z8673 Personal history of transient ischemic attack (TIA), and cerebral infarction without residual deficits: Secondary | ICD-10-CM

## 2023-02-07 DIAGNOSIS — W19XXXA Unspecified fall, initial encounter: Secondary | ICD-10-CM

## 2023-02-07 LAB — CBC
HCT: 19.4 % — ABNORMAL LOW (ref 36.0–46.0)
Hemoglobin: 6.1 g/dL — CL (ref 12.0–15.0)
MCH: 28.9 pg (ref 26.0–34.0)
MCHC: 31.4 g/dL (ref 30.0–36.0)
MCV: 91.9 fL (ref 80.0–100.0)
Platelets: 109 10*3/uL — ABNORMAL LOW (ref 150–400)
RBC: 2.11 MIL/uL — ABNORMAL LOW (ref 3.87–5.11)
RDW: 15.1 % (ref 11.5–15.5)
WBC: 11.5 10*3/uL — ABNORMAL HIGH (ref 4.0–10.5)
nRBC: 0 % (ref 0.0–0.2)

## 2023-02-07 LAB — BASIC METABOLIC PANEL
Anion gap: 9 (ref 5–15)
BUN: 41 mg/dL — ABNORMAL HIGH (ref 8–23)
CO2: 19 mmol/L — ABNORMAL LOW (ref 22–32)
Calcium: 8 mg/dL — ABNORMAL LOW (ref 8.9–10.3)
Chloride: 109 mmol/L (ref 98–111)
Creatinine, Ser: 2.47 mg/dL — ABNORMAL HIGH (ref 0.44–1.00)
GFR, Estimated: 18 mL/min — ABNORMAL LOW (ref >60)
Glucose, Bld: 115 mg/dL — ABNORMAL HIGH (ref 70–99)
Potassium: 4.7 mmol/L (ref 3.5–5.1)
Sodium: 137 mmol/L (ref 135–145)

## 2023-02-07 LAB — HEMOGLOBIN AND HEMATOCRIT, BLOOD
HCT: 30 % — ABNORMAL LOW (ref 36.0–46.0)
Hemoglobin: 10 g/dL — ABNORMAL LOW (ref 12.0–15.0)

## 2023-02-07 LAB — PREPARE RBC (CROSSMATCH)

## 2023-02-07 MED ORDER — ALLOPURINOL 100 MG PO TABS
100.0000 mg | ORAL_TABLET | Freq: Every day | ORAL | Status: DC
  Administered 2023-02-07 – 2023-02-10 (×4): 100 mg via ORAL
  Filled 2023-02-07 (×4): qty 1

## 2023-02-07 MED ORDER — IRON SUCROSE 200 MG IVPB - SIMPLE MED
200.0000 mg | Freq: Once | Status: DC
  Filled 2023-02-07: qty 110

## 2023-02-07 MED ORDER — SODIUM CHLORIDE 0.9% IV SOLUTION: Freq: Once | INTRAVENOUS | Status: AC

## 2023-02-07 MED ORDER — SODIUM CHLORIDE 0.9 % IV SOLN
200.0000 mg | Freq: Once | INTRAVENOUS | Status: AC
  Administered 2023-02-07: 200 mg via INTRAVENOUS
  Filled 2023-02-07: qty 10

## 2023-02-07 NOTE — Progress Notes (Signed)
Second unit of blood completed. Recheck of hemoglobin at 1930.

## 2023-02-07 NOTE — Progress Notes (Signed)
.  Subjective: 1 Day Post-Op Procedure(s) (LRB): INTRAMEDULLARY (IM) NAIL INTERTROCHANTERIC (Left)  Patient states pain improving. Hgb down to 6.1 and receiving 1u pRBCs  Activity level:  WBAT LLE Diet tolerance:  as tolerated Patient reports pain as 5 on 0-10 scale.    Objective: Vital signs in last 24 hours: Temp:  [97.5 F (36.4 C)-98.2 F (36.8 C)] 98.1 F (36.7 C) (02/02 0847) Pulse Rate:  [77-113] 113 (02/02 0847) Resp:  [10-18] 16 (02/02 0552) BP: (75-146)/(38-132) 146/132 (02/02 0847) SpO2:  [87 %-100 %] 100 % (02/02 0847)  Labs: Recent Labs    02/05/23 1353 02/06/23 0502 02/06/23 0801 02/07/23 0644  HGB 12.0 7.8* 8.3* 6.1*   Recent Labs    02/06/23 0502 02/06/23 0801 02/06/23 1305 02/07/23 0644  WBC 10.7*  --   --  11.5*  RBC 2.72*  --  2.38* 2.11*  HCT 24.5* 26.0*  --  19.4*  PLT 136*  --   --  109*   Recent Labs    02/06/23 0502 02/07/23 0644  NA 140 137  K 4.6 4.7  CL 110 109  CO2 20* 19*  BUN 33* 41*  CREATININE 1.53* 2.47*  GLUCOSE 107* 115*  CALCIUM 8.5* 8.0*   No results for input(s): "LABPT", "INR" in the last 72 hours.  Physical Exam:  Neurologically intact ABD soft LLE Neurovascular intact Sensation intact distally Intact pulses distally Dorsiflexion/Plantar flexion intact Incision: dressing C/D/I  Assessment/Plan:  1 Day Post-Op Procedure(s) (LRB): INTRAMEDULLARY (IM) NAIL INTERTROCHANTERIC (Left)  WBAT LLE Hgb 6.1. Likely due to acute blood loss anemia from fracture and surgery. Receiving 1u pRBCs. Continue to trend CBC and transfuse as needed Ancef 1 g every 8 hours for 3 doses for surgical prophylaxis. Pain control with PRN pain medication preferring oral medicines  DVT ppx being held due to Hgb PT/OT    Luci Bank 02/07/2023, 10:22 AM

## 2023-02-07 NOTE — Progress Notes (Signed)
PT Cancellation Note  Patient Details Name: Sharon Bailey MRN: [id number] DOB: 08/29/1930   Cancelled Treatment:    Reason Eval/Treat Not Completed: Medical issues which prohibited therapy;  Low Hgb, and receiving blood transfusion;  in max pain screaming with all repositioning today. Will cotninue attempts for PT eval as pt medically ready;   Van Clines, PT  Acute Rehabilitation Services Office [phone] Secure Chat welcomed    Levi Aland 02/07/2023, 3:26 PM

## 2023-02-07 NOTE — Progress Notes (Signed)
[facility]                                                                              Sharon Bailey, is a 89 y.o. female, DOB - [birth year], ZOX:[id number] Admit date - 02/05/2023    Outpatient Primary MD for the patient is Irven Coe, MD  LOS - 2  days  Chief Complaint  Patient presents with   Fall       Brief summary   Patient is a 89 year old female with HTN, HLP, CVA, CKD 3B, anemia, gout, hypothyroidism, chronic low back pain, OSA, lymphocytic colitis presented with mechanical fall.  Patient was at her doctor's appointment, went to the bathroom, fell backwards and landed on her left hip.  She was unable to walk afterwards due to pain.  She was down for about an hour EMS arrived, noted left leg was shortened and rotated. Chest x-ray showed no abnormality. CT head showed no acute intracranial process Left hip x-ray showed comminuted and angulated fracture of intertrochanteric left femur Orthopedics consulted, patient was admitted  Assessment & Plan    Principal Problem:   Closed comminuted and angulated intertrochanteric fracture of left femur, (HCC) -Orthopedics consulted, underwent left femur cephalomedullary nailing on 02/06/2023 (Dr Thornell Mule) -Pain control, DVT prophylaxis per orthopedics -Hemoglobin 6.1, no active bleeding.  Hemoglobin 12.0 on 02/05/2023 -Type and screen, transfuse 2 units packed RBCs    Active Problems: Acute blood loss anemia, postoperative on chronic iron deficiency anemia -Baseline hemoglobin~11 -Presented with hemoglobin of 12.0 on admission, this morning 6.1, no active bleeding noted -Anemia panel showed iron 19, TIBC 210, % sat ratio 9, -Transfused 2 units packed RBCs -IV iron x 1  Acute on chronic kidney disease stage IIIb -Baseline creatinine has been ~1.3 -Creatinine 2.47 today, likely worsened due to acute anemia -Will transfuse 2 units packed RBCs - recheck BMET in am.  If no improvement in renal function, will  obtain renal ultrasound    Essential hypertension -BP currently stable, hold Avapro -Continue amlodipine    Hyperlipidemia -Continue Lipitor  Gout - Continue home allopurinol   Hypothyroidism - Continue on Synthroid   Chronic low back pain - continue current pain regimen   OSA - Not currently on CPAP   Lymphocytic colitis - Continue home p.o. budesonide    Estimated body mass index is 25.61 kg/m as calculated from the following:   Height as of this encounter: 5' 4.02" (1.626 m).   Weight as of this encounter: 67.7 kg.  Code Status: Full code DVT Prophylaxis:  SCDs Start: 02/05/23 1617   Level of Care: Level of care: Med-Surg Family Communication: Updated patient's 3 daughters on 2/1 Disposition Plan:      Remains inpatient appropriate:      Procedures:    Consultants:   Orthopedics  Antimicrobials:   Anti-infectives (From admission, onward)    Start     Dose/Rate Route Frequency Ordered Stop   02/06/23 1400  ceFAZolin (ANCEF) IVPB 1 g/50 mL premix        1 g 100 mL/hr over 30 Minutes Intravenous Every 8 hours 02/06/23 1239 02/07/23 0615   02/06/23 1245  ceFAZolin (ANCEF) IVPB 1 g/50 mL premix  Status:  Discontinued        1 g 100 mL/hr over 30 Minutes Intravenous  Once 02/06/23 1240 02/06/23 1249   02/06/23 0855  ceFAZolin (ANCEF) 2-4 GM/100ML-% IVPB       Note to Pharmacy: Crissie Sickles: cabinet override      02/06/23 0855 02/06/23 2059          Medications  allopurinol  100 mg Oral Daily   amLODipine  5 mg Oral Daily   atorvastatin  20 mg Oral Daily   budesonide  6 mg Oral Daily   fentaNYL (SUBLIMAZE) injection  25 mcg Intravenous Once   levothyroxine  88 mcg Oral Daily      Subjective:   Sharon Bailey was seen and examined today.  Seen this morning, no acute complaints, states hip pain is controlled.  No active bleeding.  No nausea vomiting, chest pain, shortness of breath, dizziness.   Objective:   Vitals:   02/07/23 0552  02/07/23 0847 02/07/23 1111 02/07/23 1126  BP: (!) 129/46 (!) 146/132 (!) 102/39 (!) 102/37  Pulse: (!) 106 (!) 113 (!) 102 (!) 101  Resp: 16  16 16   Temp: (!) 97.5 F (36.4 C) 98.1 F (36.7 C) 97.8 F (36.6 C) 98.5 F (36.9 C)  TempSrc: Oral Oral Oral   SpO2: 97% 100% 100%   Weight:      Height:        Intake/Output Summary (Last 24 hours) at 02/07/2023 1158 Last data filed at 02/06/2023 1500 Gross per 24 hour  Intake 146.66 ml  Output --  Net 146.66 ml     Wt Readings from Last 3 Encounters:  02/06/23 67.7 kg  11/06/22 65.1 kg  10/20/22 70.2 kg   Physical Exam General: Alert and oriented x 3, NAD Cardiovascular: S1 S2 clear, RRR.  Respiratory: CTAB, no wheezing Gastrointestinal: Soft, nontender, nondistended, NBS Ext: no pedal edema bilaterally Neuro: no new deficits Psych: Normal affect      Data Reviewed:  I have personally reviewed following labs    CBC Lab Results  Component Value Date   WBC 11.5 (H) 02/07/2023   RBC 2.11 (L) 02/07/2023   HGB 6.1 (LL) 02/07/2023   HCT 19.4 (L) 02/07/2023   MCV 91.9 02/07/2023   MCH 28.9 02/07/2023   PLT 109 (L) 02/07/2023   MCHC 31.4 02/07/2023   RDW 15.1 02/07/2023   LYMPHSABS 1.5 02/05/2023   MONOABS 0.6 02/05/2023   EOSABS 0.6 (H) 02/05/2023   BASOSABS 0.1 02/05/2023     Last metabolic panel Lab Results  Component Value Date   NA 137 02/07/2023   K 4.7 02/07/2023   CL 109 02/07/2023   CO2 19 (L) 02/07/2023   BUN 41 (H) 02/07/2023   CREATININE 2.47 (H) 02/07/2023   GLUCOSE 115 (H) 02/07/2023   GFRNONAA 18 (L) 02/07/2023   GFRAA 47 (L) 07/05/2019   CALCIUM 8.0 (L) 02/07/2023   PROT 6.5 11/06/2022   ALBUMIN 3.7 11/06/2022   LABGLOB 2.8 03/23/2022   BILITOT 0.7 11/06/2022   ALKPHOS 63 11/06/2022   AST 18 11/06/2022   ALT 14 11/06/2022   ANIONGAP 9 02/07/2023    CBG (last 3)  Recent Labs    02/05/23 2106  GLUCAP 181*      Coagulation Profile: No results for input(s): "INR", "PROTIME" in  the last 168 hours.   Radiology Studies: I have personally reviewed the imaging studies  DG HIP UNILAT WITH  PELVIS 2-3 VIEWS LEFT Result Date: 02/06/2023 CLINICAL DATA:  Elective left hip surgery.  Intramedullary nail. EXAM: DG HIP (WITH OR WITHOUT PELVIS) 2-3V LEFT COMPARISON:  Left hip radiographs pelvis and left femur radiographs 02/05/2023 FINDINGS: Images were performed intraoperatively without the presence of a radiologist. The patient is undergoing long cephalomedullary nail fixation of the previously seen comminuted proximal left femoral intertrochanteric proximal femoral fracture. Improved alignment. Partial visualization of total left knee arthroplasty. Total fluoroscopy images: 8 Total fluoroscopy time: 163 seconds Total dose: Radiation Exposure Index (as provided by the fluoroscopic device): 20.9 mGy air Kerma Please see intraoperative findings for further detail. IMPRESSION: Intraoperative fluoroscopic guidance for long cephalomedullary nail fixation of the previously seen comminuted proximal left femoral intertrochanteric proximal femoral fracture. Electronically Signed   By: Neita Garnet M.D.   On: 02/06/2023 12:58   DG C-Arm 1-60 Min-No Report Result Date: 02/06/2023 Fluoroscopy was utilized by the requesting physician.  No radiographic interpretation.   CT HEAD WO CONTRAST ( ) Result Date: 02/05/2023 CLINICAL DATA:  Head trauma EXAM: CT HEAD WITHOUT CONTRAST TECHNIQUE: Contiguous axial images were obtained from the base of the skull through the vertex without intravenous contrast. RADIATION DOSE REDUCTION: This exam was performed according to the departmental dose-optimization program which includes automated exposure control, adjustment of the mA and/or kV according to patient size and/or use of iterative reconstruction technique. COMPARISON:  10/23/2019 CT head FINDINGS: Brain: No evidence of acute infarction, hemorrhage, mass, mass effect, or midline shift. No hydrocephalus or  extra-axial fluid collection. Age related cerebral atrophy. Periventricular white matter changes, likely the sequela of chronic small vessel ischemic disease. Vascular: No hyperdense vessel. Atherosclerotic calcifications in the intracranial carotid and vertebral arteries. Skull: Negative for fracture or focal lesion. Sinuses/Orbits: Mucosal thickening in the right sphenoid sinus and posterior left ethmoid air cells. Status post bilateral lens replacements. Other: The mastoid air cells are well aerated. IMPRESSION: No acute intracranial process. Electronically Signed   By: Wiliam Ke M.D.   On: 02/05/2023 15:46   DG Chest 1 View Result Date: 02/05/2023 CLINICAL DATA:  Fall. EXAM: CHEST  1 VIEW COMPARISON:  06/11/2019. FINDINGS: Bilateral lung fields are clear. Bilateral costophrenic angles are clear. Stable cardio-mediastinal silhouette. No acute osseous abnormalities. The soft tissues are within normal limits. IMPRESSION: No active disease. Electronically Signed   By: Jules Schick M.D.   On: 02/05/2023 14:52   DG Femur Min 2 Views Left Result Date: 02/05/2023 CLINICAL DATA:  Fall.  Suspected hip fracture. EXAM: LEFT FEMUR 2 VIEWS; PELVIS - 1-2 VIEW COMPARISON:  None Available. FINDINGS: There is comminuted and angulated fracture of intertrochanteric left proximal femur. No other acute fracture or dislocation. No aggressive osseous lesion. Visualized sacral arcuate lines are unremarkable. Unremarkable symphysis pubis. There are mild degenerative changes of bilateral hip joints without significant joint space narrowing. Osteophytosis of the superior acetabulum. Patient is status post left knee arthroplasty. No radiopaque foreign bodies. IMPRESSION: *Comminuted and angulated fracture of the intertrochanteric left femur. Electronically Signed   By: Jules Schick M.D.   On: 02/05/2023 14:52   DG Pelvis 1-2 Views Result Date: 02/05/2023 CLINICAL DATA:  Fall.  Suspected hip fracture. EXAM: LEFT FEMUR 2  VIEWS; PELVIS - 1-2 VIEW COMPARISON:  None Available. FINDINGS: There is comminuted and angulated fracture of intertrochanteric left proximal femur. No other acute fracture or dislocation. No aggressive osseous lesion. Visualized sacral arcuate lines are unremarkable. Unremarkable symphysis pubis. There are mild degenerative changes of bilateral hip joints without significant joint  space narrowing. Osteophytosis of the superior acetabulum. Patient is status post left knee arthroplasty. No radiopaque foreign bodies. IMPRESSION: *Comminuted and angulated fracture of the intertrochanteric left femur. Electronically Signed   By: Jules Schick M.D.   On: 02/05/2023 14:52       Ardean Melroy M.D. [facility] 02/07/2023, 11:58 AM  Available via Epic secure chat 7am-7pm After 7 pm, please refer to night coverage provider listed on amion.

## 2023-02-07 NOTE — Progress Notes (Signed)
OT Cancellation Note  Patient Details Name: Sharon Bailey MRN: [id number] DOB: 12-01-1930   Cancelled Treatment:    Reason Eval/Treat Not Completed: Medical issues which prohibited therapy (RN states low Hgb receiving blood transfusion in max pain screaming with all repositioning today. Will cotninue attempts as pt medically ready.)  Tyler Deis, OTR/L [facility] Acute Rehabilitation Office: [phone]   Myrla Halsted 02/07/2023, 2:04 PM

## 2023-02-07 NOTE — Plan of Care (Signed)

## 2023-02-08 DIAGNOSIS — S72002A Fracture of unspecified part of neck of left femur, initial encounter for closed fracture: Secondary | ICD-10-CM | POA: Diagnosis not present

## 2023-02-08 DIAGNOSIS — W19XXXA Unspecified fall, initial encounter: Secondary | ICD-10-CM | POA: Diagnosis not present

## 2023-02-08 DIAGNOSIS — D500 Iron deficiency anemia secondary to blood loss (chronic): Secondary | ICD-10-CM

## 2023-02-08 DIAGNOSIS — S72142A Displaced intertrochanteric fracture of left femur, initial encounter for closed fracture: Secondary | ICD-10-CM | POA: Diagnosis not present

## 2023-02-08 LAB — URINALYSIS, W/ REFLEX TO CULTURE (INFECTION SUSPECTED)
Bilirubin Urine: NEGATIVE
Glucose, UA: NEGATIVE mg/dL
Ketones, ur: NEGATIVE mg/dL
Nitrite: NEGATIVE
Protein, ur: NEGATIVE mg/dL
Specific Gravity, Urine: 1.015 (ref 1.005–1.030)
WBC, UA: >50 WBC/hpf (ref 0–5)
pH: 5 (ref 5.0–8.0)

## 2023-02-08 LAB — TYPE AND SCREEN
ABO/RH(D): O POS
Antibody Screen: NEGATIVE
Unit division: 0
Unit division: 0

## 2023-02-08 LAB — BASIC METABOLIC PANEL
Anion gap: 10 (ref 5–15)
BUN: 49 mg/dL — ABNORMAL HIGH (ref 8–23)
CO2: 19 mmol/L — ABNORMAL LOW (ref 22–32)
Calcium: 8.3 mg/dL — ABNORMAL LOW (ref 8.9–10.3)
Chloride: 110 mmol/L (ref 98–111)
Creatinine, Ser: 2.38 mg/dL — ABNORMAL HIGH (ref 0.44–1.00)
GFR, Estimated: 19 mL/min — ABNORMAL LOW (ref >60)
Glucose, Bld: 100 mg/dL — ABNORMAL HIGH (ref 70–99)
Potassium: 4.8 mmol/L (ref 3.5–5.1)
Sodium: 139 mmol/L (ref 135–145)

## 2023-02-08 LAB — CBC
HCT: 30.2 % — ABNORMAL LOW (ref 36.0–46.0)
Hemoglobin: 10.4 g/dL — ABNORMAL LOW (ref 12.0–15.0)
MCH: 29.8 pg (ref 26.0–34.0)
MCHC: 34.4 g/dL (ref 30.0–36.0)
MCV: 86.5 fL (ref 80.0–100.0)
Platelets: 107 10*3/uL — ABNORMAL LOW (ref 150–400)
RBC: 3.49 MIL/uL — ABNORMAL LOW (ref 3.87–5.11)
RDW: 15.4 % (ref 11.5–15.5)
WBC: 14.3 10*3/uL — ABNORMAL HIGH (ref 4.0–10.5)
nRBC: 0.3 % — ABNORMAL HIGH (ref 0.0–0.2)

## 2023-02-08 LAB — BPAM RBC
Blood Product Expiration Date: 202502222359
Blood Product Expiration Date: 202502242359
ISSUE DATE / TIME: 202502021100
ISSUE DATE / TIME: 202502021354
Unit Type and Rh: 5100
Unit Type and Rh: 5100

## 2023-02-08 MED ORDER — HYDROMORPHONE HCL 1 MG/ML IJ SOLN: 0.5000 mg | INTRAMUSCULAR | Status: DC | PRN

## 2023-02-08 MED ORDER — OXYCODONE HCL 5 MG PO TABS: 5.0000 mg | ORAL_TABLET | ORAL | Status: DC | PRN

## 2023-02-08 MED ORDER — LACTATED RINGERS IV SOLN: INTRAVENOUS | Status: AC

## 2023-02-08 MED ORDER — OXYCODONE HCL 5 MG PO TABS
5.0000 mg | ORAL_TABLET | Freq: Four times a day (QID) | ORAL | Status: DC | PRN
  Filled 2023-02-08: qty 1

## 2023-02-08 MED ORDER — ENSURE ENLIVE PO LIQD
237.0000 mL | Freq: Two times a day (BID) | ORAL | Status: DC
  Administered 2023-02-08 – 2023-02-10 (×3): 237 mL via ORAL

## 2023-02-08 MED ORDER — ACETAMINOPHEN 500 MG PO TABS
1000.0000 mg | ORAL_TABLET | Freq: Three times a day (TID) | ORAL | Status: DC
  Administered 2023-02-08 – 2023-02-10 (×7): 1000 mg via ORAL
  Filled 2023-02-08 (×7): qty 2

## 2023-02-08 MED ORDER — LABETALOL HCL 5 MG/ML IV SOLN: 10.0000 mg | INTRAVENOUS | Status: DC | PRN

## 2023-02-08 NOTE — Progress Notes (Signed)
.  Subjective: 2 Days Post-Op Procedure(s) (LRB): INTRAMEDULLARY (IM) NAIL INTERTROCHANTERIC (Left) Patient states pain improving. Hgb now 10 after receiving 2u pRBCs   Activity level:  WBAT LLE Diet tolerance:  as tolerated Patient reports pain as 5 on 0-10 scale.    Objective: Vital signs in last 24 hours: Temp:  [97.7 F (36.5 C)-98.7 F (37.1 C)] 98.1 F (36.7 C) (02/03 0757) Pulse Rate:  [89-108] 106 (02/03 0757) Resp:  [16-19] 19 (02/03 0757) BP: (85-149)/(39-66) 140/52 (02/03 0757) SpO2:  [95 %-99 %] 99 % (02/03 0757)  Labs: Recent Labs    02/06/23 0502 02/06/23 0801 02/07/23 0644 02/07/23 2008 02/08/23 0451  HGB 7.8* 8.3* 6.1* 10.0* 10.4*   Recent Labs    02/07/23 0644 02/07/23 2008 02/08/23 0451  WBC 11.5*  --  14.3*  RBC 2.11*  --  3.49*  HCT 19.4* 30.0* 30.2*  PLT 109*  --  107*   Recent Labs    02/07/23 0644 02/08/23 0451  NA 137 139  K 4.7 4.8  CL 109 110  CO2 19* 19*  BUN 41* 49*  CREATININE 2.47* 2.38*  GLUCOSE 115* 100*  CALCIUM 8.0* 8.3*   No results for input(s): "LABPT", "INR" in the last 72 hours.  Physical Exam:  Neurologically intact ABD soft Neurovascular intact Sensation intact distally Intact pulses distally Dorsiflexion/Plantar flexion intact Incision: scant drainage  Assessment/Plan:  2 Days Post-Op Procedure(s) (LRB): INTRAMEDULLARY (IM) NAIL INTERTROCHANTERIC (Left) WBAT LLE Hgb 10 from 6.1. Likely due to acute blood loss anemia from fracture and surgery. Received 2u pRBCs. Continue to trend CBC and transfuse as needed Pain control with PRN pain medication preferring oral medicines  DVT ppx being held due to Hgb PT/OT   Luci Bank 02/08/2023, 12:25 PM

## 2023-02-08 NOTE — Evaluation (Signed)
Physical Therapy Evaluation  Patient Details Name: Sharon Bailey MRN: [id number] DOB: December 09, 1930 Today's Date: 02/08/2023  History of Present Illness  Sharon Bailey is a 89 y.o. female presenting 02/05/2023 after a fall, resulting in L hip fx, now s/p IM Nail on 02/06/2023, WBAT; with PMH significant of hypertension, CVA, CKD 3B, anemia, gout, hypothyroidism, low back pain,  lymphocytic colitis, hearing loss.   Clinical Impression  Pt admitted with above diagnosis. Pt currently with functional limitations due to the deficits listed below (see PT Problem List). At the time of PT eval pt was able to perform transfers with up to +2 total assist. Pt unable to achieve full stand this session with x2 attempts and +2 assist. Recommend post-acute therapy follow up <3 hours/day to maximize functional recovery, decrease risk for falls, and facilitate return home with family support. Of note, pt with fluid filled blister noted under Tegaderm portion of dressing on hip. Dr. Hulda Humphrey notified at end of session. Pt will benefit from acute skilled PT to increase their independence and safety with mobility to allow discharge.           If plan is discharge home, recommend the following: Two people to help with walking and/or transfers;Two people to help with bathing/dressing/bathroom;Assistance with cooking/housework;Direct supervision/assist for medications management;Assist for transportation;Help with stairs or ramp for entrance;Supervision due to cognitive status   Can travel by private vehicle   No    Equipment Recommendations None recommended by PT (TBD by next venue of care)  Recommendations for Other Services       Functional Status Assessment Patient has had a recent decline in their functional status and demonstrates the ability to make significant improvements in function in a reasonable and predictable amount of time.     Precautions / Restrictions Precautions Precautions:  Fall Restrictions Weight Bearing Restrictions Per Provider Order: Yes LLE Weight Bearing Per Provider Order: Weight bearing as tolerated      Mobility  Bed Mobility Overal bed mobility: Needs Assistance Bed Mobility: Supine to Sit, Sit to Supine, Rolling Rolling: Total assist   Supine to sit: Total assist, +2 for physical assistance, +2 for safety/equipment Sit to supine: Total assist, +2 for physical assistance, +2 for safety/equipment   General bed mobility comments: cueing for technique, total assist +2 overall    Transfers Overall transfer level: Needs assistance Equipment used: 2 person hand held assist Transfers: Sit to/from Stand             General transfer comment: partial stand with total assist +2, multiple attempts and unable to fully stand    Ambulation/Gait                  Stairs            Wheelchair Mobility     Tilt Bed    Modified Rankin (Stroke Patients Only)       Balance Overall balance assessment: Needs assistance Sitting-balance support: No upper extremity supported, Feet supported, Bilateral upper extremity supported Sitting balance-Leahy Scale: Fair Sitting balance - Comments: preference to 1-2 hand support , posteiror lean   Standing balance support: Bilateral upper extremity supported, During functional activity Standing balance-Leahy Scale: Zero                               Pertinent Vitals/Pain Pain Assessment Pain Assessment: Faces Faces Pain Scale: Hurts little more Pain Location: L hip/LE Pain Descriptors / Indicators:  Discomfort, Grimacing, Guarding, Operative site guarding Pain Intervention(s): Limited activity within patient's tolerance, Monitored during session, Repositioned    Home Living Family/patient expects to be discharged to:: Private residence Living Arrangements: Alone Available Help at Discharge: Family;Available 24 hours/day Type of Home: House Home Access: Stairs to  enter Entrance Stairs-Rails: Right;Left (garage only 1) Entrance Stairs-Number of Steps: 3 garage, 4 front   Home Layout: One level Home Equipment: Agricultural consultant (2 wheels);Cane - quad;Shower seat;Grab bars - tub/shower;BSC/3in1      Prior Function Prior Level of Function : Independent/Modified Independent             Mobility Comments: uses quad cane or family support for mobility in commuinty, no AD in home ADLs Comments: does not drive; indepedent ADLs, light IADLs (washing clothes, light cooking), manages meds using pill box     Extremity/Trunk Assessment   Upper Extremity Assessment Upper Extremity Assessment: Defer to OT evaluation    Lower Extremity Assessment Lower Extremity Assessment: Generalized weakness    Cervical / Trunk Assessment Cervical / Trunk Assessment: Kyphotic  Communication   Communication Communication: Hearing impairment Cueing Techniques: Verbal cues;Gestural cues;Tactile cues  Cognition Arousal: Alert Behavior During Therapy: Flat affect Overall Cognitive Status: Impaired/Different from baseline Area of Impairment: Orientation, Attention, Memory, Following commands, Safety/judgement, Awareness, Problem solving                 Orientation Level: Disoriented to, Place, Time Current Attention Level: Focused Memory: Decreased recall of precautions, Decreased short-term memory Following Commands: Follows one step commands with increased time, Follows multi-step commands inconsistently, Follows one step commands inconsistently Safety/Judgement: Decreased awareness of safety, Decreased awareness of deficits Awareness: Intellectual Problem Solving: Slow processing, Decreased initiation, Difficulty sequencing, Requires verbal cues, Requires tactile cues General Comments: reports being in [place], follows some simple commands with increased time. pleasantly confused        General Comments General comments (skin integrity, edema,  etc.): VSS HR up to 116, daughter present and supportive    Exercises     Assessment/Plan    PT Assessment Patient needs continued PT services  PT Problem List Decreased strength;Decreased range of motion;Decreased activity tolerance;Decreased balance;Decreased mobility;Decreased knowledge of use of DME;Decreased safety awareness;Decreased knowledge of precautions;Pain       PT Treatment Interventions DME instruction;Gait training;Stair training;Functional mobility training;Therapeutic exercise;Therapeutic activities;Patient/family education    PT Goals (Current goals can be found in the Care Plan section)  Acute Rehab PT Goals Patient Stated Goal: None stated PT Goal Formulation: With patient/family Time For Goal Achievement: 02/22/23 Potential to Achieve Goals: Good    Frequency Min 1X/week     Co-evaluation PT/OT/SLP Co-Evaluation/Treatment: Yes Reason for Co-Treatment: For patient/therapist safety;To address functional/ADL transfers;Necessary to address cognition/behavior during functional activity PT goals addressed during session: Mobility/safety with mobility;Balance;Proper use of DME;Strengthening/ROM OT goals addressed during session: ADL's and self-care       AM-PAC PT "6 Clicks" Mobility  Outcome Measure Help needed turning from your back to your side while in a flat bed without using bedrails?: Total Help needed moving from lying on your back to sitting on the side of a flat bed without using bedrails?: Total Help needed moving to and from a bed to a chair (including a wheelchair)?: Total Help needed standing up from a chair using your arms (e.g., wheelchair or bedside chair)?: Total Help needed to walk in hospital room?: Total Help needed climbing 3-5 steps with a railing? : Total 6 Click Score: 6    End  of Session Equipment Utilized During Treatment: Gait belt Activity Tolerance: Patient tolerated treatment well Patient left: in bed;with call bell/phone  within reach;with bed alarm set;with family/visitor present Nurse Communication: Mobility status PT Visit Diagnosis: Unsteadiness on feet (R26.81);Pain Pain - Right/Left: Left Pain - part of body: Hip    Time: 1206-1233 PT Time Calculation (min) (ACUTE ONLY): 27 min   Charges:   PT Evaluation $PT Eval Moderate Complexity: 1 Mod   PT General Charges $$ ACUTE PT VISIT: 1 Visit         Conni Slipper, PT, DPT Acute Rehabilitation Services Secure Chat Preferred Office: [phone]   Marylynn Pearson 02/08/2023, 2:23 PM

## 2023-02-08 NOTE — Evaluation (Signed)
Occupational Therapy Evaluation Patient Details Name: Sharon Bailey MRN: [id number] DOB: Sep 11, 1930 Today's Date: 02/08/2023   History of Present Illness Sharon Bailey is a 89 y.o. female presenting after a fall, resulting in L hip fx, now s/p IMNail, WBAT; with medical history significant of hypertension, hyperlipidemia, CVA, CKD 3B, anemia, gout, hypothyroidism, low back pain, OSA, lymphocytic colitis, hearing loss   Clinical Impression   PTA patient independent with ADLs, light IADLs and managing meds; daughter reports no AD used in the house, but used quad cane vs family support when in community.  Admitted for above and presents with problem list below. Patient requires total assist +2 for bed mobility and partial standing at EOB, min to total assist +2 for ADLs.  She is oriented to self but otherwise pleasantly confused, following some simple commands with increased time. Based on performance today, believe patient will best benefit from continued OT services acutely and after dc at inpatient setting with <3hrs/day to optimize independence, safety with ADLs and mobility.        If plan is discharge home, recommend the following: Two people to help with walking and/or transfers;Two people to help with bathing/dressing/bathroom;Direct supervision/assist for medications management;Assistance with cooking/housework;Direct supervision/assist for financial management;Assist for transportation;Help with stairs or ramp for entrance    Functional Status Assessment  Patient has had a recent decline in their functional status and demonstrates the ability to make significant improvements in function in a reasonable and predictable amount of time.  Equipment Recommendations  Other (comment) (defer)    Recommendations for Other Services       Precautions / Restrictions Precautions Precautions: Fall Restrictions Weight Bearing Restrictions Per Provider Order: Yes LLE Weight Bearing Per Provider  Order: Weight bearing as tolerated      Mobility Bed Mobility Overal bed mobility: Needs Assistance Bed Mobility: Supine to Sit, Sit to Supine     Supine to sit: Total assist, +2 for physical assistance, +2 for safety/equipment Sit to supine: Total assist, +2 for physical assistance, +2 for safety/equipment   General bed mobility comments: cueing for technique, total assist +2 overall    Transfers Overall transfer level: Needs assistance Equipment used: 2 person hand held assist Transfers: Sit to/from Stand             General transfer comment: partial stand with total assist +2, multiple attempts and unable to fully stand      Balance Overall balance assessment: Needs assistance Sitting-balance support: No upper extremity supported, Feet supported, Bilateral upper extremity supported Sitting balance-Leahy Scale: Fair Sitting balance - Comments: preference to 1-2 hand support , posteiror lean   Standing balance support: Bilateral upper extremity supported, During functional activity Standing balance-Leahy Scale: Zero                             ADL either performed or assessed with clinical judgement   ADL Overall ADL's : Needs assistance/impaired     Grooming: Minimal assistance;Sitting           Upper Body Dressing : Maximal assistance;Sitting   Lower Body Dressing: Total assistance;+2 for physical assistance;Sitting/lateral leans;Bed level     Toilet Transfer Details (indicate cue type and reason): deferred         Functional mobility during ADLs: Total assistance;+2 for physical assistance       Vision   Vision Assessment?: No apparent visual deficits     Perception  Praxis         Pertinent Vitals/Pain Pain Assessment Pain Assessment: Faces Faces Pain Scale: Hurts little more Pain Location: L hip/LE Pain Descriptors / Indicators: Discomfort, Grimacing, Guarding, Operative site guarding Pain Intervention(s):  Monitored during session, Limited activity within patient's tolerance, Repositioned     Extremity/Trunk Assessment Upper Extremity Assessment Upper Extremity Assessment: Generalized weakness   Lower Extremity Assessment Lower Extremity Assessment: Defer to PT evaluation       Communication Communication Communication: Hearing impairment Cueing Techniques: Verbal cues;Tactile cues   Cognition Arousal: Alert Behavior During Therapy: Flat affect Overall Cognitive Status: Impaired/Different from baseline Area of Impairment: Orientation, Attention, Memory, Following commands, Safety/judgement, Awareness, Problem solving                 Orientation Level: Disoriented to, Place, Time Current Attention Level: Focused Memory: Decreased recall of precautions, Decreased short-term memory Following Commands: Follows one step commands with increased time, Follows multi-step commands inconsistently, Follows one step commands inconsistently Safety/Judgement: Decreased awareness of safety, Decreased awareness of deficits Awareness: Intellectual Problem Solving: Slow processing, Decreased initiation, Difficulty sequencing, Requires verbal cues, Requires tactile cues General Comments: reports being in [place], follows some simple commands with increased time. pleasantly confused     General Comments  VSS HR up to 116, daughter present and supportive    Exercises     Shoulder Instructions      Home Living Family/patient expects to be discharged to:: Private residence Living Arrangements: Alone Available Help at Discharge: Family;Available 24 hours/day Type of Home: House Home Access: Stairs to enter [employer] of Steps: 3 garage, 4 front Entrance Stairs-Rails: Right;Left (garage only 1) Home Layout: One level     Bathroom Shower/Tub: Chief Strategy Officer: Handicapped height     Home Equipment: Agricultural consultant (2 wheels);Cane - quad;Shower  seat;Grab bars - tub/shower;BSC/3in1          Prior Functioning/Environment Prior Level of Function : Independent/Modified Independent             Mobility Comments: uses quad cane or family support for mobility in commuinty, no AD in home ADLs Comments: does not drive; indepednet ADLs, light IADLs (washing clothes, light cooking), manages meds using pill box        OT Problem List: Decreased strength;Decreased activity tolerance;Impaired balance (sitting and/or standing);Pain;Decreased cognition;Decreased safety awareness;Decreased knowledge of use of DME or AE;Decreased knowledge of precautions;Increased edema      OT Treatment/Interventions: Self-care/ADL training;Therapeutic exercise;DME and/or AE instruction;Therapeutic activities;Patient/family education;Balance training;Cognitive remediation/compensation    OT Goals(Current goals can be found in the care plan section) Acute Rehab OT Goals Patient Stated Goal: get her better OT Goal Formulation: With family Time For Goal Achievement: 02/22/23 Potential to Achieve Goals: Good  OT Frequency: Min 1X/week    Co-evaluation PT/OT/SLP Co-Evaluation/Treatment: Yes Reason for Co-Treatment: For patient/therapist safety;To address functional/ADL transfers;Necessary to address cognition/behavior during functional activity   OT goals addressed during session: ADL's and self-care      AM-PAC OT "6 Clicks" Daily Activity     Outcome Measure Help from another person eating meals?: A Little Help from another person taking care of personal grooming?: A Little Help from another person toileting, which includes using toliet, bedpan, or urinal?: Total Help from another person bathing (including washing, rinsing, drying)?: A Lot Help from another person to put on and taking off regular upper body clothing?: A Lot Help from another person to put on and taking off regular lower body clothing?: Total  6 Click Score: 12   End of Session  Equipment Utilized During Treatment: Gait belt;Oxygen Nurse Communication: Mobility status  Activity Tolerance: Patient tolerated treatment well Patient left: in bed;with call bell/phone within reach;with bed alarm set;with family/visitor present  OT Visit Diagnosis: Other abnormalities of gait and mobility (R26.89);Muscle weakness (generalized) (M62.81);Pain;Other symptoms and signs involving cognitive function Pain - Right/Left: Left Pain - part of body: Hip;Leg;Knee                Time: 1610-9604 OT Time Calculation (min): 27 min Charges:  OT General Charges $OT Visit: 1 Visit OT Evaluation $OT Eval Moderate Complexity: 1 Mod  Barry Brunner, OT Acute Rehabilitation Services Office [phone]   Chancy Milroy 02/08/2023, 1:44 PM

## 2023-02-08 NOTE — TOC Initial Note (Signed)
Transition of Care [facility]) - Initial/Assessment Note    Patient Details  Name: Sharon Bailey MRN: [id number] Date of Birth: [birth year]  [facility]) CM/SW Contact:    Lorri Frederick, LCSW Phone Number: 02/08/2023, 3:22 PM  Clinical Narrative:    Pt listed as oriented x2 in epic, able to participate in conversation.  Daughter Graciella Belton also present.  Discussed PT recommendation for SNF and they are in agreement with this, medicare choice document provided, permission given to send out referral in hub.  Pt from home alone, no current services.  Referral sent out in hub for SNF.               Expected Discharge Plan: Skilled Nursing Facility Barriers to Discharge: Continued Medical Work up, SNF Pending bed offer   Patient Goals and CMS Choice Patient states their goals for this hospitalization and ongoing recovery are:: get back on my feet CMS Medicare.gov Compare Post Acute Care list provided to:: Patient Represenative (must comment) (daughter Graciella Belton) Choice offered to / list presented to : Adult Children      Expected Discharge Plan and Services In-house Referral: Clinical Social Work   Post Acute Care Choice: Skilled Nursing Facility Living arrangements for the past 2 months: Single Family Home                                      Prior Living Arrangements/Services Living arrangements for the past 2 months: Single Family Home Lives with:: Self Patient language and need for interpreter reviewed:: Yes Do you feel safe going back to the place where you live?: Yes      Need for Family Participation in Patient Care: Yes (Comment) Care giver support system in place?: Yes (comment) Current home services: Other (comment) (no services) Criminal Activity/Legal Involvement Pertinent to Current Situation/Hospitalization: No - Comment as needed  Activities of Daily Living   ADL Screening (condition at time of admission) Independently performs ADLs?: No Does the  patient have a NEW difficulty with bathing/dressing/toileting/self-feeding that is expected to last >3 days?: Yes (Initiates electronic notice to provider for possible OT consult) (left femur fracture) Does the patient have a NEW difficulty with getting in/out of bed, walking, or climbing stairs that is expected to last >3 days?: Yes (Initiates electronic notice to provider for possible PT consult) (left femur fracture) Does the patient have a NEW difficulty with communication that is expected to last >3 days?: No Is the patient deaf or have difficulty hearing?: Yes Does the patient have difficulty seeing, even when wearing glasses/contacts?: No Does the patient have difficulty concentrating, remembering, or making decisions?: No  Permission Sought/Granted                  Emotional Assessment Appearance:: Appears stated age Attitude/Demeanor/Rapport: Engaged Affect (typically observed): Pleasant, Appropriate Orientation: : Oriented to Self, Oriented to Place      Admission diagnosis:  Closed fracture of left hip, initial encounter (HCC) [S72.002A] Fall, initial encounter [W19.XXXA] Closed comminuted intertrochanteric fracture of left femur, initial encounter [facility]) [S72.142A] Patient Active Problem List   Diagnosis Date Noted   Closed comminuted intertrochanteric fracture of left femur, initial encounter (HCC) 02/05/2023   Collagenous colitis 11/06/2022   Frail elderly 03/04/2022   Anemia 02/21/2022   Lymphocytic colitis 02/17/2022   Bilateral impacted cerumen 02/27/2021   Allergic rhinitis 06/18/2020   Cerebrovascular disease 06/18/2020   Chronic kidney  disease, stage 3 unspecified (HCC) 06/18/2020   Hypothyroidism 06/18/2020   Increased frequency of urination 06/18/2020   Senile purpura (HCC) 06/18/2020   Venous insufficiency of both lower extremities 01/11/2020   History of CVA (cerebrovascular accident) 09/06/2019   Nausea and vomiting 06/11/2019   Asymmetrical hearing  loss of left ear 10/31/2018   Obstructive sleep apnea 10/31/2018   Sensorineural hearing loss (SNHL) of both ears 10/31/2018   Low back pain 09/21/2017   Chronic fatigue 02/05/2017   Atypical chest pain 02/05/2017   Hyperlipidemia 02/05/2017   Palpitations 04/18/2016   Essential hypertension 04/18/2016   Hypertensive heart disease without heart failure 04/18/2016   Status post foot surgery 10/11/2012   Gout attack 09/28/2012   Hammer toe of left foot 09/28/2012   Pain in joint, ankle and foot 09/28/2012   PCP:  Irven Coe, MD Pharmacy:   [facility] [phone] [place], [place] - [address] AT [facility] [address] [place] [address] Phone: [phone] Fax: [phone]  [facility] - [place], [place] - [address] [place] [address] Phone: [phone] Fax: [phone]     Social Drivers of Health (SDOH) Social History: SDOH Screenings   Food Insecurity: No Food Insecurity (02/05/2023)  Housing: Low Risk  (02/05/2023)  Transportation Needs: No Transportation Needs (02/05/2023)  Utilities: Not At Risk (02/05/2023)  Depression (PHQ2-9): Low Risk  (02/21/2022)  Social Connections: Moderately Integrated (02/05/2023)  Tobacco Use: Low Risk  (02/06/2023)   SDOH Interventions:     Readmission Risk Interventions     No data to display

## 2023-02-08 NOTE — Progress Notes (Addendum)
[facility]                                                                              Kathrin Folden, is a 89 y.o. female, DOB - 22-May-1930, UXL:[id number] Admit date - 02/05/2023    Outpatient Primary MD for the patient is Irven Coe, MD  LOS - 3  days  Chief Complaint  Patient presents with   Fall       Brief summary   Patient is a 89 year old female with HTN, HLP, CVA, CKD 3B, anemia, gout, hypothyroidism, chronic low back pain, OSA, lymphocytic colitis presented with mechanical fall.  Patient was at her doctor's appointment, went to the bathroom, fell backwards and landed on her left hip.  She was unable to walk afterwards due to pain.  She was down for about an hour EMS arrived, noted left leg was shortened and rotated. Chest x-ray showed no abnormality. CT head showed no acute intracranial process Left hip x-ray showed comminuted and angulated fracture of intertrochanteric left femur Orthopedics consulted, patient was admitted  Assessment & Plan    Principal Problem:   Closed comminuted and angulated intertrochanteric fracture of left femur, (HCC) -Orthopedics consulted, underwent left femur cephalomedullary nailing on 02/06/2023 (Dr Thornell Mule) -Pain control, DVT prophylaxis per orthopedics -Hemoglobin 6.1 on 2/2, received 2 units packed RBCs. -Hemoglobin stable, 10.4, continue PT    Active Problems: Acute blood loss anemia, postoperative on chronic iron deficiency anemia -Baseline hemoglobin~11 -Hemoglobin 6.1 on 2/2, received 2 units packed RBCs. -Anemia panel showed iron 19, TIBC 210, % sat ratio 9, --IV iron x 1  Acute metabolic encephalopathy -Somewhat confused to this morning, ?  Narcotics, daughter at the bedside -Patient is oriented to herself, recognized her daughter but did not know where she was -Placed on scheduled Tylenol for pain, decreased Dilaudid, discontinued Norco  -Placed on oxycodone.  Follow UA/culture.  If no significant  improvement, will obtain CT head.  Acute on chronic kidney disease stage IIIb -Baseline creatinine has been ~1.3 -Creatinine improving to 2.3 today, was 2.47 on 2/2 -Continue to hold Avapro    Essential hypertension -BP currently stable, hold Avapro -Continue amlodipine, add IV labetalol as needed with parameters    Hyperlipidemia -Continue Lipitor  Gout - Continue home allopurinol   Hypothyroidism - Continue on Synthroid   Chronic low back pain -Close monitoring with pain medications   OSA - Not currently on CPAP   Lymphocytic colitis - Continue home p.o. budesonide    Estimated body mass index is 25.61 kg/m as calculated from the following:   Height as of this encounter: 5' 4.02" (1.626 m).   Weight as of this encounter: 67.7 kg.  Code Status: Full code DVT Prophylaxis:  SCDs Start: 02/05/23 1617   Level of Care: Level of care: Med-Surg Family Communication: Updated patient's daughter, Crystal at the bedside Disposition Plan:      Remains inpatient appropriate:      Procedures:    Consultants:   Orthopedics  Antimicrobials:   Anti-infectives (From admission, onward)    Start     Dose/Rate Route Frequency Ordered Stop   02/06/23 1400  ceFAZolin (ANCEF) IVPB 1 g/50 mL premix        1 g 100 mL/hr over 30 Minutes Intravenous Every 8 hours 02/06/23 1239 02/07/23 0615   02/06/23 1245  ceFAZolin (ANCEF) IVPB 1 g/50 mL premix  Status:  Discontinued        1 g 100 mL/hr over 30 Minutes Intravenous  Once 02/06/23 1240 02/06/23 1249   02/06/23 0855  ceFAZolin (ANCEF) 2-4 GM/100ML-% IVPB       Note to Pharmacy: Crissie Sickles: cabinet override      02/06/23 0855 02/06/23 2059          Medications  acetaminophen  1,000 mg Oral TID   allopurinol  100 mg Oral Daily   amLODipine  5 mg Oral Daily   atorvastatin  20 mg Oral Daily   budesonide  6 mg Oral Daily   fentaNYL (SUBLIMAZE) injection  25 mcg Intravenous Once   levothyroxine  88 mcg Oral Daily       Subjective:   Margaux Engen was seen and examined today.  Seen this, somewhat confused, oriented to self, person, recognizes her daughter.  Pain is controlled.  No nausea vomiting, chest pain, shortness of breath any fevers.   Objective:   Vitals:   02/07/23 2214 02/08/23 0407 02/08/23 0527 02/08/23 0757  BP: (!) 122/55 (!) 149/47 (!) 148/53 (!) 140/52  Pulse: (!) 108 (!) 102 (!) 106 (!) 106  Resp: 16   19  Temp: 97.7 F (36.5 C) 98.7 F (37.1 C) 98.7 F (37.1 C) 98.1 F (36.7 C)  TempSrc: Oral Oral Oral Oral  SpO2: 95% 98% 98% 99%  Weight:      Height:        Intake/Output Summary (Last 24 hours) at 02/08/2023 1342 Last data filed at 02/08/2023 0943 Gross per 24 hour  Intake 706 ml  Output 600 ml  Net 106 ml     Wt Readings from Last 3 Encounters:  02/06/23 67.7 kg  11/06/22 65.1 kg  10/20/22 70.2 kg    Physical Exam General: Alert and oriented x self and recommend Cardiovascular: S1 S2 clear, RRR.  Respiratory: CTAB, no wheezing Gastrointestinal: Soft, nontender, nondistended, NBS Ext: no pedal edema bilaterally Neuro: follows commands, no acute FND's Psych: somewhat confused     Data Reviewed:  I have personally reviewed following labs    CBC Lab Results  Component Value Date   WBC 14.3 (H) 02/08/2023   RBC 3.49 (L) 02/08/2023   HGB 10.4 (L) 02/08/2023   HCT 30.2 (L) 02/08/2023   MCV 86.5 02/08/2023   MCH 29.8 02/08/2023   PLT 107 (L) 02/08/2023   MCHC 34.4 02/08/2023   RDW 15.4 02/08/2023   LYMPHSABS 1.5 02/05/2023   MONOABS 0.6 02/05/2023   EOSABS 0.6 (H) 02/05/2023   BASOSABS 0.1 02/05/2023     Last metabolic panel Lab Results  Component Value Date   NA 139 02/08/2023   K 4.8 02/08/2023   CL 110 02/08/2023   CO2 19 (L) 02/08/2023   BUN 49 (H) 02/08/2023   CREATININE 2.38 (H) 02/08/2023   GLUCOSE 100 (H) 02/08/2023   GFRNONAA 19 (L) 02/08/2023   GFRAA 47 (L) 07/05/2019   CALCIUM 8.3 (L) 02/08/2023   PROT 6.5 11/06/2022    ALBUMIN 3.7 11/06/2022   LABGLOB 2.8 03/23/2022   BILITOT 0.7 11/06/2022   ALKPHOS 63 11/06/2022   AST 18 11/06/2022   ALT 14 11/06/2022   ANIONGAP 10 02/08/2023    CBG (last  3)  Recent Labs    02/05/23 2106  GLUCAP 181*      Coagulation Profile: No results for input(s): "INR", "PROTIME" in the last 168 hours.   Radiology Studies: I have personally reviewed the imaging studies  No results found.      Thad Ranger M.D. [facility] 02/08/2023, 1:42 PM  Available via Epic secure chat 7am-7pm After 7 pm, please refer to night coverage provider listed on amion.

## 2023-02-08 NOTE — [place] FL2 (Signed)
[place] MEDICAID FL2 LEVEL OF CARE FORM     IDENTIFICATION  Patient Name: Sharon Bailey Birthdate: [birth year] Sex: female Admission Date (Current Location): 02/05/2023  [facility] and [place] Number:  Producer, television/film/video and Address:  [facility], [address] [place], [place] [address]      Provider Number: [id number]  Attending Physician Name and Address:  Cathren Harsh, MD  Relative Name and Phone Number:  Loy,Diane Daughter [phone]    Current Level of Care: Hospital Recommended Level of Care: Skilled Nursing Facility Prior Approval Number:    Date Approved/Denied:   PASRR Number:    Discharge Plan: SNF    Current Diagnoses: Patient Active Problem List   Diagnosis Date Noted   Closed comminuted intertrochanteric fracture of left femur, initial encounter (HCC) 02/05/2023   Collagenous colitis 11/06/2022   Frail elderly 03/04/2022   Anemia 02/21/2022   Lymphocytic colitis 02/17/2022   Bilateral impacted cerumen 02/27/2021   Allergic rhinitis 06/18/2020   Cerebrovascular disease 06/18/2020   Chronic kidney disease, stage 3 unspecified (HCC) 06/18/2020   Hypothyroidism 06/18/2020   Increased frequency of urination 06/18/2020   Senile purpura (HCC) 06/18/2020   Venous insufficiency of both lower extremities 01/11/2020   History of CVA (cerebrovascular accident) 09/06/2019   Nausea and vomiting 06/11/2019   Asymmetrical hearing loss of left ear 10/31/2018   Obstructive sleep apnea 10/31/2018   Sensorineural hearing loss (SNHL) of both ears 10/31/2018   Low back pain 09/21/2017   Chronic fatigue 02/05/2017   Atypical chest pain 02/05/2017   Hyperlipidemia 02/05/2017   Palpitations 04/18/2016   Essential hypertension 04/18/2016   Hypertensive heart disease without heart failure 04/18/2016   Status post foot surgery 10/11/2012   Gout attack 09/28/2012   Hammer toe of left foot 09/28/2012   Pain in joint, ankle and foot  09/28/2012    Orientation RESPIRATION BLADDER Height & Weight     Self, Situation, Place  O2 Incontinent, External catheter Weight: 149 lb 4 oz (67.7 kg) Height:  5' 4.02" (162.6 cm)  BEHAVIORAL SYMPTOMS/MOOD NEUROLOGICAL BOWEL NUTRITION STATUS      Continent Diet (see discharge summary)  AMBULATORY STATUS COMMUNICATION OF NEEDS Skin   Total Care Verbally Surgical wounds                       Personal Care Assistance Level of Assistance  Bathing, Feeding, Dressing, Total care Bathing Assistance: Maximum assistance Feeding assistance: Maximum assistance Dressing Assistance: Maximum assistance Total Care Assistance: Maximum assistance   Functional Limitations Info  Sight, Hearing, Speech Sight Info: Adequate Hearing Info: Impaired Speech Info: Adequate    SPECIAL CARE FACTORS FREQUENCY  PT (By licensed PT), OT (By licensed OT)     PT Frequency: 5x week OT Frequency: 5x week            Contractures Contractures Info: Not present    Additional Factors Info  Code Status, Allergies Code Status Info: full Allergies Info: Bactrim (Sulfamethoxazole-trimethoprim), Cipro (Ciprofloxacin Hcl)           Current Medications (02/08/2023):  This is the current hospital active medication list Current Facility-Administered Medications  Medication Dose Route Frequency Provider Last Rate Last Admin   acetaminophen (TYLENOL) tablet 1,000 mg  1,000 mg Oral TID Rai, Ripudeep K, MD   1,000 mg at 02/08/23 1031   allopurinol (ZYLOPRIM) tablet 100 mg  100 mg Oral Daily Rai, Ripudeep K, MD   100 mg at  02/08/23 1031   amLODipine (NORVASC) tablet 5 mg  5 mg Oral Daily Luci Bank, MD   5 mg at 02/08/23 1031   atorvastatin (LIPITOR) tablet 20 mg  20 mg Oral Daily Luci Bank, MD   20 mg at 02/08/23 1030   budesonide (ENTOCORT EC) 24 hr capsule 6 mg  6 mg Oral Daily Luci Bank, MD   6 mg at 02/08/23 1030   feeding supplement (ENSURE ENLIVE / ENSURE PLUS) liquid 237 mL  237 mL  Oral BID BM Rai, Ripudeep K, MD   237 mL at 02/08/23 1401   fentaNYL (SUBLIMAZE) injection 25 mcg  25 mcg Intravenous Once Luci Bank, MD       HYDROmorphone (DILAUDID) injection 0.5 mg  0.5 mg Intravenous Q4H PRN Rai, Ripudeep K, MD       labetalol (NORMODYNE) injection 10 mg  10 mg Intravenous Q4H PRN Rai, Ripudeep K, MD       levothyroxine (SYNTHROID) tablet 88 mcg  88 mcg Oral Daily Luci Bank, MD   88 mcg at 02/08/23 0530   oxyCODONE (Oxy IR/ROXICODONE) immediate release tablet 5 mg  5 mg Oral Q6H PRN Rai, Ripudeep K, MD       polyethylene glycol (MIRALAX / GLYCOLAX) packet 17 g  17 g Oral Daily PRN Luci Bank, MD         Discharge Medications: Please see discharge summary for a list of discharge medications.  Relevant Imaging Results:  Relevant Lab Results:   Additional Information SSN: [id number]  Lorri Frederick, LCSW

## 2023-02-09 ENCOUNTER — Encounter (HOSPITAL_COMMUNITY): Payer: Self-pay | Admitting: Orthopedic Surgery

## 2023-02-09 DIAGNOSIS — N183 Chronic kidney disease, stage 3 unspecified: Secondary | ICD-10-CM

## 2023-02-09 DIAGNOSIS — S72002A Fracture of unspecified part of neck of left femur, initial encounter for closed fracture: Secondary | ICD-10-CM | POA: Diagnosis not present

## 2023-02-09 DIAGNOSIS — W19XXXA Unspecified fall, initial encounter: Secondary | ICD-10-CM | POA: Diagnosis not present

## 2023-02-09 DIAGNOSIS — S72142A Displaced intertrochanteric fracture of left femur, initial encounter for closed fracture: Secondary | ICD-10-CM | POA: Diagnosis not present

## 2023-02-09 LAB — CBC
HCT: 30.2 % — ABNORMAL LOW (ref 36.0–46.0)
Hemoglobin: 10.3 g/dL — ABNORMAL LOW (ref 12.0–15.0)
MCH: 29 pg (ref 26.0–34.0)
MCHC: 34.1 g/dL (ref 30.0–36.0)
MCV: 85.1 fL (ref 80.0–100.0)
Platelets: 134 10*3/uL — ABNORMAL LOW (ref 150–400)
RBC: 3.55 MIL/uL — ABNORMAL LOW (ref 3.87–5.11)
RDW: 14.7 % (ref 11.5–15.5)
WBC: 15.8 10*3/uL — ABNORMAL HIGH (ref 4.0–10.5)
nRBC: 0.2 % (ref 0.0–0.2)

## 2023-02-09 LAB — BASIC METABOLIC PANEL
Anion gap: 12 (ref 5–15)
BUN: 51 mg/dL — ABNORMAL HIGH (ref 8–23)
CO2: 19 mmol/L — ABNORMAL LOW (ref 22–32)
Calcium: 8.9 mg/dL (ref 8.9–10.3)
Chloride: 105 mmol/L (ref 98–111)
Creatinine, Ser: 1.77 mg/dL — ABNORMAL HIGH (ref 0.44–1.00)
GFR, Estimated: 27 mL/min — ABNORMAL LOW (ref >60)
Glucose, Bld: 117 mg/dL — ABNORMAL HIGH (ref 70–99)
Potassium: 4.6 mmol/L (ref 3.5–5.1)
Sodium: 136 mmol/L (ref 135–145)

## 2023-02-09 MED ORDER — POLYETHYLENE GLYCOL 3350 17 G PO PACK
17.0000 g | PACK | Freq: Two times a day (BID) | ORAL | Status: DC
  Administered 2023-02-09: 17 g via ORAL
  Filled 2023-02-09: qty 1

## 2023-02-09 MED ORDER — LACTATED RINGERS IV SOLN: INTRAVENOUS | Status: DC

## 2023-02-09 MED ORDER — BISACODYL 10 MG RE SUPP
10.0000 mg | Freq: Once | RECTAL | Status: AC
  Administered 2023-02-09: 10 mg via RECTAL
  Filled 2023-02-09: qty 1

## 2023-02-09 MED ORDER — DOCUSATE SODIUM 100 MG PO CAPS
100.0000 mg | ORAL_CAPSULE | Freq: Two times a day (BID) | ORAL | Status: DC
  Administered 2023-02-09: 100 mg via ORAL
  Filled 2023-02-09: qty 1

## 2023-02-09 MED ORDER — SODIUM CHLORIDE 0.9 % IV SOLN
1.0000 g | INTRAVENOUS | Status: DC
  Administered 2023-02-09 – 2023-02-10 (×2): 1 g via INTRAVENOUS
  Filled 2023-02-09 (×2): qty 10

## 2023-02-09 NOTE — TOC Progression Note (Signed)
 Transition of Care [facility]) - Progression Note    Patient Details  Name: Sharon Bailey MRN: [id number] Date of Birth: 01/31/1930  Transition of Care [facility]) CM/SW Contact  Bridget Cordella Simmonds, LCSW Phone Number: 02/09/2023, 8:51 AM  Clinical Narrative:   PASSR received: [id number].    Expected Discharge Plan: Skilled Nursing Facility Barriers to Discharge: Continued Medical Work up, SNF Pending bed offer  Expected Discharge Plan and Services In-house Referral: Clinical Social Work   Post Acute Care Choice: Skilled Nursing Facility Living arrangements for the past 2 months: Single Family Home                                       Social Determinants of Health (SDOH) Interventions SDOH Screenings   Food Insecurity: No Food Insecurity (02/05/2023)  Housing: Low Risk  (02/05/2023)  Transportation Needs: No Transportation Needs (02/05/2023)  Utilities: Not At Risk (02/05/2023)  Depression (PHQ2-9): Low Risk  (02/21/2022)  Social Connections: Moderately Integrated (02/05/2023)  Tobacco Use: Low Risk  (02/06/2023)    Readmission Risk Interventions     No data to display

## 2023-02-09 NOTE — TOC Progression Note (Addendum)
 Transition of Care [facility]) - Progression Note    Patient Details  Name: Sharon Bailey MRN: [id number] Date of Birth: 07-29-1930  Transition of Care [facility]) CM/SW Contact  Bridget Cordella Simmonds, LCSW Phone Number: 02/09/2023, 10:12 AM  Clinical Narrative:   Bed offers provided to pt daughter Cristil, she is asking for response from [facility].    1400: [facility] cannot offer, Cristil updated, requesting response from [facility].  CSW reached out to [facility]any[facility].  1500: Call from [facility] requesting the referral.  Sent.  [facility] does offer.   1520: [facility] does offer.  Spoke with pt daughter [name], she does want to accept offer at [facility].  Discussed $46/day extra charge and daughter in agreement with this. [facility] informed, can receive pt today.   Expected Discharge Plan: Skilled Nursing Facility Barriers to Discharge: Continued Medical Work up, SNF Pending bed offer  Expected Discharge Plan and Services In-house Referral: Clinical Social Work   Post Acute Care Choice: Skilled Nursing Facility Living arrangements for the past 2 months: Single Family Home                                       Social Determinants of Health (SDOH) Interventions SDOH Screenings   Food Insecurity: No Food Insecurity (02/05/2023)  Housing: Low Risk  (02/05/2023)  Transportation Needs: No Transportation Needs (02/05/2023)  Utilities: Not At Risk (02/05/2023)  Depression (PHQ2-9): Low Risk  (02/21/2022)  Social Connections: Moderately Integrated (02/05/2023)  Tobacco Use: Low Risk  (02/06/2023)    Readmission Risk Interventions     No data to display

## 2023-02-09 NOTE — Progress Notes (Signed)
 [facility]                                                                              Sharon Bailey, is a 89 y.o. female, DOB - 09-18-1930, FMW:[id number] Admit date - 02/05/2023    Outpatient Primary MD for the patient is Leonel Cole, MD  LOS - 4  days  Chief Complaint  Patient presents with   Fall       Brief summary   Patient is a 89 year old female with HTN, HLP, CVA, CKD 3B, anemia, gout, hypothyroidism, chronic low back pain, OSA, lymphocytic colitis presented with mechanical fall.  Patient was at her doctor's appointment, went to the bathroom, fell backwards and landed on her left hip.  She was unable to walk afterwards due to pain.  She was down for about an hour EMS arrived, noted left leg was shortened and rotated. Chest x-ray showed no abnormality. CT head showed no acute intracranial process Left hip x-ray showed comminuted and angulated fracture of intertrochanteric left femur Orthopedics consulted, patient was admitted  Assessment & Plan    Principal Problem:   Closed comminuted and angulated intertrochanteric fracture of left femur, (HCC) -Orthopedics consulted, underwent left femur cephalomedullary nailing on 02/06/2023 (Dr Adine Mon) -Pain control, DVT prophylaxis per orthopedics -Hemoglobin 10.3, received 2 units packed RBCs on 2/2  -PT recommended SNF   Active Problems: Acute blood loss anemia, postoperative on chronic iron  deficiency anemia -Baseline hemoglobin~11 -Hemoglobin 6.1 on 2/2, received 2 units packed RBCs. -Anemia panel showed iron  19, TIBC 210, % sat ratio 9, --IV iron  x 1 -H&H stable, close to baseline  Acute metabolic encephalopathy, ?  Narcotics versus UTI -Mental status much improved, today -UA with large leukocytes, rare bacteria, WBCs > 50 -For now placed on Rocephin  1 g IV x 3 days, urine culture pending.  Acute on chronic kidney disease stage IIIb -Baseline creatinine has been ~1.3 -Creatinine improving,  plateaued at 2.4 on 2/2 Continue to hold Avapro     Essential hypertension -BP currently stable, hold Avapro  -Continue amlodipine , add IV labetalol  as needed with parameters    Hyperlipidemia -Continue Lipitor  Gout - Continue home allopurinol    Hypothyroidism - Continue on Synthroid    Chronic low back pain -Close monitoring with pain medications   OSA - Not currently on CPAP   Lymphocytic colitis - Continue home p.o. budesonide   Constipation -Per daughter, patient had BM prior to surgery, placed on MiraLAX  twice daily, Colace, Dulcolax supp x1    Estimated body mass index is 25.61 kg/m as calculated from the following:   Height as of this encounter: 5' 4.02 (1.626 m).   Weight as of this encounter: 67.7 kg.  Code Status: Full code DVT Prophylaxis:  SCDs Start: 02/05/23 1617   Level of Care: Level of care: Med-Surg Family Communication: Updated patient's daughter, Crystal at the bedside today Disposition Plan:      Remains inpatient appropriate:      Procedures:    Consultants:   Orthopedics  Antimicrobials:   Anti-infectives (From admission, onward)    Start     Dose/Rate Route Frequency Ordered Stop   02/09/23 0800  cefTRIAXone  (ROCEPHIN ) 1 g in sodium chloride  0.9 % 100 mL IVPB        1 g 200 mL/hr over 30 Minutes Intravenous Every 24 hours 02/09/23 0746 02/12/23 0759   02/06/23 1400  ceFAZolin  (ANCEF ) IVPB 1 g/50 mL premix        1 g 100 mL/hr over 30 Minutes Intravenous Every 8 hours 02/06/23 1239 02/07/23 0615   02/06/23 1245  ceFAZolin  (ANCEF ) IVPB 1 g/50 mL premix  Status:  Discontinued        1 g 100 mL/hr over 30 Minutes Intravenous  Once 02/06/23 1240 02/06/23 1249   02/06/23 0855  ceFAZolin  (ANCEF ) 2-4 GM/100ML-% IVPB       Note to Pharmacy: Lebron Devere HERO: cabinet override      02/06/23 0855 02/06/23 2059          Medications  acetaminophen   1,000 mg Oral TID   allopurinol   100 mg Oral Daily   amLODipine   5 mg Oral Daily    atorvastatin   20 mg Oral Daily   budesonide   6 mg Oral Daily   docusate sodium   100 mg Oral BID   feeding supplement  237 mL Oral BID BM   fentaNYL  (SUBLIMAZE ) injection  25 mcg Intravenous Once   levothyroxine   88 mcg Oral Daily   polyethylene glycol  17 g Oral BID      Subjective:   Sharon Bailey was seen and examined today.  Feeling much better today, alert and oriented, daughter at the bedside.  + Constipation.  Pain controlled, no nausea vomiting, chest pain, shortness of breath, fever.  Objective:   Vitals:   02/09/23 0522 02/09/23 0834 02/09/23 0835 02/09/23 1318  BP: (!) 160/66 (!) 143/55 (!) 143/55 (!) 178/63  Pulse: 97 97 97 100  Resp:  16    Temp: 99.1 F (37.3 C) 97.8 F (36.6 C) 97.8 F (36.6 C) (!) 97.5 F (36.4 C)  TempSrc: Oral  Oral Oral  SpO2: 97% 98% 98% 98%  Weight:      Height:        Intake/Output Summary (Last 24 hours) at 02/09/2023 1457 Last data filed at 02/08/2023 1622 Gross per 24 hour  Intake 60 ml  Output 100 ml  Net -40 ml     Wt Readings from Last 3 Encounters:  02/06/23 67.7 kg  11/06/22 65.1 kg  10/20/22 70.2 kg   Physical Exam General: Alert and oriented x 3, NAD Cardiovascular: S1 S2 clear, RRR.  Respiratory: CTAB, no wheezing Gastrointestinal: Soft, nontender, nondistended, NBS Ext: no pedal edema bilaterally Neuro: no new deficits Psych: Normal affect, pleasant and cooperative     Data Reviewed:  I have personally reviewed following labs    CBC Lab Results  Component Value Date   WBC 15.8 (H) 02/09/2023   RBC 3.55 (L) 02/09/2023   HGB 10.3 (L) 02/09/2023   HCT 30.2 (L) 02/09/2023   MCV 85.1 02/09/2023   MCH 29.0 02/09/2023   PLT 134 (L) 02/09/2023   MCHC 34.1 02/09/2023   RDW 14.7 02/09/2023   LYMPHSABS 1.5 02/05/2023   MONOABS 0.6 02/05/2023   EOSABS 0.6 (H) 02/05/2023   BASOSABS 0.1 02/05/2023     Last metabolic panel Lab Results  Component Value Date   NA 136 02/09/2023   K 4.6 02/09/2023   CL  105 02/09/2023   CO2 19 (L) 02/09/2023   BUN 51 (H) 02/09/2023   CREATININE 1.77 (H) 02/09/2023   GLUCOSE 117 (H) 02/09/2023  GFRNONAA 27 (L) 02/09/2023   GFRAA 47 (L) 07/05/2019   CALCIUM  8.9 02/09/2023   PROT 6.5 11/06/2022   ALBUMIN  3.7 11/06/2022   LABGLOB 2.8 03/23/2022   BILITOT 0.7 11/06/2022   ALKPHOS 63 11/06/2022   AST 18 11/06/2022   ALT 14 11/06/2022   ANIONGAP 12 02/09/2023    CBG (last 3)  No results for input(s): GLUCAP in the last 72 hours.     Coagulation Profile: No results for input(s): INR, PROTIME in the last 168 hours.   Radiology Studies: I have personally reviewed the imaging studies  No results found.      Nydia Distance M.D. [facility] 02/09/2023, 2:57 PM  Available via Epic secure chat 7am-7pm After 7 pm, please refer to night coverage provider listed on amion.

## 2023-02-09 NOTE — Plan of Care (Signed)

## 2023-02-10 DIAGNOSIS — M25421 Effusion, right elbow: Secondary | ICD-10-CM | POA: Diagnosis not present

## 2023-02-10 DIAGNOSIS — R531 Weakness: Secondary | ICD-10-CM | POA: Diagnosis not present

## 2023-02-10 DIAGNOSIS — Z7401 Bed confinement status: Secondary | ICD-10-CM | POA: Diagnosis not present

## 2023-02-10 DIAGNOSIS — R609 Edema, unspecified: Secondary | ICD-10-CM | POA: Diagnosis not present

## 2023-02-10 DIAGNOSIS — Z79899 Other long term (current) drug therapy: Secondary | ICD-10-CM | POA: Diagnosis not present

## 2023-02-10 DIAGNOSIS — Z7982 Long term (current) use of aspirin: Secondary | ICD-10-CM | POA: Diagnosis not present

## 2023-02-10 DIAGNOSIS — S72002A Fracture of unspecified part of neck of left femur, initial encounter for closed fracture: Secondary | ICD-10-CM | POA: Diagnosis not present

## 2023-02-10 DIAGNOSIS — W19XXXD Unspecified fall, subsequent encounter: Secondary | ICD-10-CM | POA: Diagnosis not present

## 2023-02-10 DIAGNOSIS — G8929 Other chronic pain: Secondary | ICD-10-CM | POA: Diagnosis not present

## 2023-02-10 DIAGNOSIS — Z8669 Personal history of other diseases of the nervous system and sense organs: Secondary | ICD-10-CM | POA: Diagnosis not present

## 2023-02-10 DIAGNOSIS — L0390 Cellulitis, unspecified: Secondary | ICD-10-CM | POA: Diagnosis not present

## 2023-02-10 DIAGNOSIS — L989 Disorder of the skin and subcutaneous tissue, unspecified: Secondary | ICD-10-CM | POA: Diagnosis not present

## 2023-02-10 DIAGNOSIS — R2681 Unsteadiness on feet: Secondary | ICD-10-CM | POA: Diagnosis not present

## 2023-02-10 DIAGNOSIS — L03116 Cellulitis of left lower limb: Secondary | ICD-10-CM | POA: Diagnosis not present

## 2023-02-10 DIAGNOSIS — M545 Low back pain, unspecified: Secondary | ICD-10-CM | POA: Diagnosis not present

## 2023-02-10 DIAGNOSIS — N183 Chronic kidney disease, stage 3 unspecified: Secondary | ICD-10-CM | POA: Diagnosis not present

## 2023-02-10 DIAGNOSIS — K449 Diaphragmatic hernia without obstruction or gangrene: Secondary | ICD-10-CM | POA: Diagnosis not present

## 2023-02-10 DIAGNOSIS — K52832 Lymphocytic colitis: Secondary | ICD-10-CM | POA: Diagnosis not present

## 2023-02-10 DIAGNOSIS — D509 Iron deficiency anemia, unspecified: Secondary | ICD-10-CM | POA: Diagnosis not present

## 2023-02-10 DIAGNOSIS — R601 Generalized edema: Secondary | ICD-10-CM | POA: Diagnosis not present

## 2023-02-10 DIAGNOSIS — R2689 Other abnormalities of gait and mobility: Secondary | ICD-10-CM | POA: Diagnosis not present

## 2023-02-10 DIAGNOSIS — D500 Iron deficiency anemia secondary to blood loss (chronic): Secondary | ICD-10-CM | POA: Diagnosis not present

## 2023-02-10 DIAGNOSIS — R262 Difficulty in walking, not elsewhere classified: Secondary | ICD-10-CM | POA: Diagnosis not present

## 2023-02-10 DIAGNOSIS — Z96652 Presence of left artificial knee joint: Secondary | ICD-10-CM | POA: Diagnosis not present

## 2023-02-10 DIAGNOSIS — E039 Hypothyroidism, unspecified: Secondary | ICD-10-CM | POA: Diagnosis not present

## 2023-02-10 DIAGNOSIS — I129 Hypertensive chronic kidney disease with stage 1 through stage 4 chronic kidney disease, or unspecified chronic kidney disease: Secondary | ICD-10-CM | POA: Diagnosis not present

## 2023-02-10 DIAGNOSIS — Z4789 Encounter for other orthopedic aftercare: Secondary | ICD-10-CM | POA: Diagnosis not present

## 2023-02-10 DIAGNOSIS — I150 Renovascular hypertension: Secondary | ICD-10-CM | POA: Diagnosis not present

## 2023-02-10 DIAGNOSIS — M109 Gout, unspecified: Secondary | ICD-10-CM | POA: Diagnosis not present

## 2023-02-10 DIAGNOSIS — L03113 Cellulitis of right upper limb: Secondary | ICD-10-CM | POA: Diagnosis not present

## 2023-02-10 DIAGNOSIS — R220 Localized swelling, mass and lump, head: Secondary | ICD-10-CM | POA: Diagnosis not present

## 2023-02-10 DIAGNOSIS — I700 Atherosclerosis of aorta: Secondary | ICD-10-CM | POA: Diagnosis not present

## 2023-02-10 DIAGNOSIS — B370 Candidal stomatitis: Secondary | ICD-10-CM | POA: Diagnosis not present

## 2023-02-10 DIAGNOSIS — E440 Moderate protein-calorie malnutrition: Secondary | ICD-10-CM | POA: Diagnosis not present

## 2023-02-10 DIAGNOSIS — I10 Essential (primary) hypertension: Secondary | ICD-10-CM | POA: Diagnosis not present

## 2023-02-10 DIAGNOSIS — E46 Unspecified protein-calorie malnutrition: Secondary | ICD-10-CM | POA: Diagnosis not present

## 2023-02-10 DIAGNOSIS — G459 Transient cerebral ischemic attack, unspecified: Secondary | ICD-10-CM | POA: Diagnosis not present

## 2023-02-10 DIAGNOSIS — M7989 Other specified soft tissue disorders: Secondary | ICD-10-CM | POA: Diagnosis present

## 2023-02-10 DIAGNOSIS — W19XXXA Unspecified fall, initial encounter: Secondary | ICD-10-CM | POA: Diagnosis not present

## 2023-02-10 DIAGNOSIS — D62 Acute posthemorrhagic anemia: Secondary | ICD-10-CM | POA: Diagnosis not present

## 2023-02-10 DIAGNOSIS — M6281 Muscle weakness (generalized): Secondary | ICD-10-CM | POA: Diagnosis not present

## 2023-02-10 DIAGNOSIS — R600 Localized edema: Secondary | ICD-10-CM | POA: Diagnosis not present

## 2023-02-10 DIAGNOSIS — I82601 Acute embolism and thrombosis of unspecified veins of right upper extremity: Secondary | ICD-10-CM | POA: Diagnosis not present

## 2023-02-10 DIAGNOSIS — N1832 Chronic kidney disease, stage 3b: Secondary | ICD-10-CM | POA: Diagnosis not present

## 2023-02-10 DIAGNOSIS — S72302D Unspecified fracture of shaft of left femur, subsequent encounter for closed fracture with routine healing: Secondary | ICD-10-CM | POA: Diagnosis not present

## 2023-02-10 DIAGNOSIS — I959 Hypotension, unspecified: Secondary | ICD-10-CM | POA: Diagnosis not present

## 2023-02-10 DIAGNOSIS — S72142D Displaced intertrochanteric fracture of left femur, subsequent encounter for closed fracture with routine healing: Secondary | ICD-10-CM | POA: Diagnosis not present

## 2023-02-10 DIAGNOSIS — I82621 Acute embolism and thrombosis of deep veins of right upper extremity: Secondary | ICD-10-CM | POA: Diagnosis not present

## 2023-02-10 DIAGNOSIS — R0902 Hypoxemia: Secondary | ICD-10-CM | POA: Diagnosis not present

## 2023-02-10 DIAGNOSIS — S72142A Displaced intertrochanteric fracture of left femur, initial encounter for closed fracture: Secondary | ICD-10-CM | POA: Diagnosis not present

## 2023-02-10 LAB — BASIC METABOLIC PANEL
Anion gap: 11 (ref 5–15)
BUN: 47 mg/dL — ABNORMAL HIGH (ref 8–23)
CO2: 19 mmol/L — ABNORMAL LOW (ref 22–32)
Calcium: 9 mg/dL (ref 8.9–10.3)
Chloride: 105 mmol/L (ref 98–111)
Creatinine, Ser: 1.31 mg/dL — ABNORMAL HIGH (ref 0.44–1.00)
GFR, Estimated: 39 mL/min — ABNORMAL LOW (ref >60)
Glucose, Bld: 115 mg/dL — ABNORMAL HIGH (ref 70–99)
Potassium: 4.4 mmol/L (ref 3.5–5.1)
Sodium: 135 mmol/L (ref 135–145)

## 2023-02-10 LAB — CBC
HCT: 32.8 % — ABNORMAL LOW (ref 36.0–46.0)
Hemoglobin: 11.3 g/dL — ABNORMAL LOW (ref 12.0–15.0)
MCH: 29.4 pg (ref 26.0–34.0)
MCHC: 34.5 g/dL (ref 30.0–36.0)
MCV: 85.4 fL (ref 80.0–100.0)
Platelets: 168 10*3/uL (ref 150–400)
RBC: 3.84 MIL/uL — ABNORMAL LOW (ref 3.87–5.11)
RDW: 14.9 % (ref 11.5–15.5)
WBC: 17.7 10*3/uL — ABNORMAL HIGH (ref 4.0–10.5)
nRBC: 0.3 % — ABNORMAL HIGH (ref 0.0–0.2)

## 2023-02-10 MED ORDER — POLYETHYLENE GLYCOL 3350 17 G PO PACK: 17.0000 g | PACK | Freq: Every day | ORAL | Status: DC | PRN

## 2023-02-10 MED ORDER — TRAMADOL HCL 50 MG PO TABS: 50.0000 mg | ORAL_TABLET | Freq: Three times a day (TID) | ORAL | 0 refills | Status: AC | PRN

## 2023-02-10 MED ORDER — ALLOPURINOL 300 MG PO TABS: 150.0000 mg | ORAL_TABLET | Freq: Every day | ORAL | Status: AC

## 2023-02-10 MED ORDER — POLYETHYLENE GLYCOL 3350 17 G PO PACK: 17.0000 g | PACK | Freq: Every day | ORAL | Status: AC | PRN

## 2023-02-10 MED ORDER — CEPHALEXIN 500 MG PO CAPS: 500.0000 mg | ORAL_CAPSULE | Freq: Two times a day (BID) | ORAL | Status: AC

## 2023-02-10 MED ORDER — ACETAMINOPHEN 500 MG PO TABS: 1000.0000 mg | ORAL_TABLET | Freq: Three times a day (TID) | ORAL | Status: DC | PRN

## 2023-02-10 NOTE — Progress Notes (Signed)
 Mobility Specialist Progress Note:   02/10/23 1100  Mobility  Activity Transferred from bed to chair  Level of Assistance +2 (takes two people)  Assistive Device Stedy  LLE Weight Bearing Per Provider Order WBAT  Activity Response Tolerated well  Mobility visit 1 Mobility  Mobility Specialist Start Time (ACUTE ONLY) 1004  Mobility Specialist Stop Time (ACUTE ONLY) 1023  Mobility Specialist Time Calculation (min) (ACUTE ONLY) 19 min    Pt received in bed and agreeable. Required modA+2 to come EOB. Able to stand in stedy w/ minA+2. No complaints throughout. Pt left in chair with call bell and family present. Chair alarm on.  D'Vante Nicholaus Mobility Specialist Please contact via Special Educational Needs Teacher or Rehab office at [phone]

## 2023-02-10 NOTE — Progress Notes (Signed)
 Physical Therapy Treatment Patient Details Name: Sharon Bailey MRN: [id number] DOB: 12/02/1930 Today's Date: 02/10/2023   History of Present Illness Sharon Bailey is a 89 y.o. female presenting 02/05/2023 after a fall, resulting in L hip fx, now s/p IM Nail on 02/06/2023, WBAT; with PMH significant of hypertension, CVA, CKD 3B, anemia, gout, hypothyroidism, low back pain, lymphocytic colitis, hearing loss.    PT Comments  Pt received in recliner, agreeable to therapy session with emphasis on transfer training and safety, reorientation to situation, and use of DME (Stedy platform lift) to progress OOB. Pt requesting to use bathroom due to decreased insight into deficits but when instructed on use of BSC, pt agreeable and tolerated transfer to/from chair>BSC>EOB using Stedy with mod to maxA lift assist needed (+2 for safety throughout). Pt needing totalA for peri-care after BM. Pt in supine with LLE elevated and RN arriving to address surgical wounds which are still blistering and draining at end of session. Pt continues to benefit from PT services to progress toward functional mobility goals.     If plan is discharge home, recommend the following: Two people to help with walking and/or transfers;Two people to help with bathing/dressing/bathroom;Assistance with cooking/housework;Direct supervision/assist for medications management;Assist for transportation;Help with stairs or ramp for entrance;Supervision due to cognitive status   Can travel by private vehicle     No  Equipment Recommendations  None recommended by PT (TBD by next venue of care)    Recommendations for Other Services       Precautions / Restrictions Precautions Precautions: Fall Precaution Comments: blistering around her L hip wound, RN/MD aware; LUE wound near lateral elbow Restrictions Weight Bearing Restrictions Per Provider Order: Yes LLE Weight Bearing Per Provider Order: Weight bearing as tolerated     Mobility  Bed  Mobility Overal bed mobility: Needs Assistance Bed Mobility: Sit to Sidelying, Rolling Rolling: Max assist, +2 for safety/equipment, Used rails       Sit to sidelying: Max assist, +2 for physical assistance, Used rails General bed mobility comments: cueing for technique, pt attempting to follow cues but needs BLE assist to get limbs over bed due to L hip pain/anxiety. Rolling to L/R sides to situate briefs under her hips, but left briefs open so RN can change L hip surgical dressing prior to pt getting fully dressed.    Transfers Overall transfer level: Needs assistance Equipment used: Ambulation equipment used Transfers: Sit to/from Stand, Bed to chair/wheelchair/BSC Sit to Stand: Max assist, Mod assist, Via lift equipment, +2 safety/equipment           General transfer comment: from chair<>Stedy and BSC<>Stedy and to bed via Stedy, pt with difficulty maintaining standing for long and unable to weight shift in stance due to pain/bowel urgency, pt did well standing with Stedy platform, needs dense cues for proper body mechanics. ModA to stand from elevated stedy flaps but maxA +2 from lower surfaces Transfer via Lift Equipment: Stedy  Ambulation/Gait               General Gait Details: pt in too much pain to weight shift in stance but does stand fully upright today in [place] multiple reps   Stairs             Wheelchair Mobility     Tilt Bed    Modified Rankin (Stroke Patients Only)       Balance Overall balance assessment: Needs assistance Sitting-balance support: No upper extremity supported, Feet supported, Bilateral upper extremity supported Sitting  balance-Leahy Scale: Fair Sitting balance - Comments: preference to 1-2 hand support , posteiror lean   Standing balance support: Bilateral upper extremity supported, During functional activity Standing balance-Leahy Scale: Poor Standing balance comment: +1 assist for static standing in Stedy during hyiene  assist, unable to weight shift in stance                            Cognition Arousal: Alert Behavior During Therapy: Anxious, Impulsive Overall Cognitive Status: Impaired/Different from baseline Area of Impairment: Orientation, Attention, Memory, Following commands, Safety/judgement, Awareness, Problem solving                 Orientation Level: Disoriented to, Place, Time Current Attention Level: Focused Memory: Decreased recall of precautions, Decreased short-term memory Following Commands: Follows one step commands with increased time, Follows multi-step commands inconsistently Safety/Judgement: Decreased awareness of safety, Decreased awareness of deficits Awareness: Intellectual Problem Solving: Difficulty sequencing, Requires verbal cues, Requires tactile cues General Comments: Mild confusion throughout, pt needs frequent reminders for safety; pt anxious due to need for BM but also due to pain, following simple commands as able. Pt anxious when speaking with family and daughters reassuring her.        Exercises Other Exercises Other Exercises: STS x 4 reps (from lower surface x2 and from [facility] flaps x2) Other Exercises: seated ankle pumps and LAQ x 5 reps ea    General Comments General comments (skin integrity, edema, etc.): SpO2 97% on RA and HR ~110 bpm with activity (pt in pain)      Pertinent Vitals/Pain Pain Assessment Pain Assessment: Faces Faces Pain Scale: Hurts even more Pain Location: L hip/LE Pain Descriptors / Indicators: Discomfort, Grimacing, Guarding, Operative site guarding Pain Intervention(s): Limited activity within patient's tolerance, Monitored during session, Premedicated before session, Repositioned    Home Living                          Prior Function            PT Goals (current goals can now be found in the care plan section) Acute Rehab PT Goals Patient Stated Goal: None stated PT Goal Formulation: With  patient/family Time For Goal Achievement: 02/22/23 Progress towards PT goals: Progressing toward goals    Frequency    Min 1X/week      PT Plan      Co-evaluation              AM-PAC PT 6 Clicks Mobility   Outcome Measure  Help needed turning from your back to your side while in a flat bed without using bedrails?: A Lot Help needed moving from lying on your back to sitting on the side of a flat bed without using bedrails?: Total Help needed moving to and from a bed to a chair (including a wheelchair)?: Total Help needed standing up from a chair using your arms (e.g., wheelchair or bedside chair)?: A Lot Help needed to walk in hospital room?: Total Help needed climbing 3-5 steps with a railing? : Total 6 Click Score: 8    End of Session Equipment Utilized During Treatment: Gait belt Activity Tolerance: Patient tolerated treatment well;Patient limited by pain Patient left: in bed;with call bell/phone within reach;with bed alarm set;with family/visitor present;with nursing/sitter in room (family returning to room, RN present to address her wound care needs) Nurse Communication: Mobility status;Need for lift equipment PT Visit Diagnosis: Unsteadiness on feet (  R26.81);Pain Pain - Right/Left: Left Pain - part of body: Hip     Time: 8764-8696 PT Time Calculation (min) (ACUTE ONLY): 28 min  Charges:    $Therapeutic Exercise: 8-22 mins $Therapeutic Activity: 8-22 mins PT General Charges $$ ACUTE PT VISIT: 1 Visit                     Kaulana Brindle P., PTA Acute Rehabilitation Services Secure Chat Preferred 9a-5:30pm Office: [phone]    Connell HERO [facility] 02/10/2023, 1:20 PM

## 2023-02-10 NOTE — Discharge Summary (Signed)
 Physician Discharge Summary   Patient: Sharon Bailey MRN: [id number] DOB: 02-26-1930  Admit date:     02/05/2023  Discharge date: 02/10/23  Discharge Physician: Nydia Distance, MD    PCP: Leonel Cole, MD   Recommendations at discharge:   Continue Keflex  500 mg twice daily for 1 more day. Diovan  currently on hold, repeat Bumex on 02/12/2023, may resume if creatinine at baseline  Discharge Diagnoses:    Closed comminuted intertrochanteric fracture of left femur, initial encounter (HCC)   UTI   Acute on chronic kidney disease, stage 3b unspecified (HCC)   Acute blood loss anemia, postoperative on chronic iron  deficiency anemia   Essential hypertension   Hyperlipidemia   Obstructive sleep apnea   History of CVA (cerebrovascular accident)   History of lymphocytic colitis   Constipation    Hospital Course:  Patient is a 89 year old female with HTN, HLP, CVA, CKD 3B, anemia, gout, hypothyroidism, chronic low back pain, OSA, lymphocytic colitis presented with mechanical fall.  Patient was at her doctor's appointment, went to the bathroom, fell backwards and landed on her left hip.  She was unable to walk afterwards due to pain.  She was down for about an hour EMS arrived, noted left leg was shortened and rotated. Chest x-ray showed no abnormality. CT head showed no acute intracranial process Left hip x-ray showed comminuted and angulated fracture of intertrochanteric left femur Orthopedics consulted, patient was admitted   Assessment and Plan:  Closed comminuted and angulated intertrochanteric fracture of left femur, (HCC) -Orthopedics consulted, underwent left femur cephalomedullary nailing on 02/06/2023 (Dr Adine Mon) -Hemoglobin 10.3, received 2 units packed RBCs on 2/2  -PT recommended SNF -Continue physical therapy, outpatient follow-up with Dr. Mon in 2 weeks      Acute blood loss anemia, postoperative on chronic iron  deficiency anemia -Baseline hemoglobin~11 -Hemoglobin  6.1 on 2/2, received 2 units packed RBCs. -Anemia panel showed iron  19, TIBC 210, % sat ratio 9, -Received IV iron  x 1 -Hemoglobin stable, 10.3 at discharge   Acute metabolic encephalopathy, ?  Narcotics versus UTI -Mental status much improved.  Discontinued oxycodone . -UA with large leukocytes, rare bacteria, WBCs > 50 -Has received 2 days of IV Rocephin , will continue Keflex  500 mg p.o. twice daily for 1 more day to complete course for 3 days.   Acute on chronic kidney disease stage IIIb -Baseline creatinine has been ~1.3 -Creatinine improving, plateaued at 2.4 on 2/2 Continue to hold Diovan ,  - creatinine 1.7,  recheck bmet on 02/12/2023 -Continue p.o. hydration     Essential hypertension -BP currently stable, hold Avapro  -Continue amlodipine     Hyperlipidemia -Continue Lipitor   Gout - Continue home allopurinol    Hypothyroidism - Continue on Synthroid    Chronic low back pain -Close monitoring with pain medications   OSA - Not currently on CPAP   Lymphocytic colitis - Continue home p.o. budesonide    Constipation -Resolved, continue MiraLAX  as needed     Estimated body mass index is 25.61 kg/m as calculated from the following:   Height as of this encounter: 5' 4.02 (1.626 m).   Weight as of this encounter: 67.7 kg.      Pain control - [place]  Controlled Substance Reporting System database was reviewed. and patient was instructed, not to drive, operate heavy machinery, perform activities at heights, swimming or participation in water activities or provide baby-sitting services while on Pain, Sleep and Anxiety Medications; until their outpatient Physician has advised to do so again. Also recommended to not  to take more than prescribed Pain, Sleep and Anxiety Medications.  Consultants: Orthopedics Procedures performed:  left femur cephalomedullary nailing on 02/06/2023 (Dr Adine Mon)  Disposition: Skilled nursing facility Diet recommendation:  Discharge  Diet Orders (From admission, onward)     Start     Ordered   02/10/23 0000  Diet general        02/10/23 1046           DISCHARGE MEDICATION: Allergies as of 02/10/2023       Reactions   Bactrim  [sulfamethoxazole -trimethoprim ] Diarrhea, Nausea Only   Cipro [ciprofloxacin Hcl] Hives        Medication List     PAUSE taking these medications    valsartan  160 MG tablet Wait to take this until your doctor or other care provider tells you to start again. Commonly known as: DIOVAN  TAKE 1 TABLET EVERY DAY       STOP taking these medications    ibuprofen  200 MG tablet Commonly known as: ADVIL        TAKE these medications    acetaminophen  500 MG tablet Commonly known as: TYLENOL  Take 2 tablets (1,000 mg total) by mouth every 8 (eight) hours as needed for mild pain (pain score 1-3).   allopurinol  300 MG tablet Commonly known as: ZYLOPRIM  Take 0.5 tablets (150 mg total) by mouth daily. What changed: how much to take   amLODipine  5 MG tablet Commonly known as: NORVASC  TAKE 1 TABLET (5 MG TOTAL) BY MOUTH DAILY.   aspirin  EC 81 MG tablet Take 81 mg by mouth daily. Swallow whole.   budesonide  3 MG 24 hr capsule Commonly known as: ENTOCORT EC  TAKE 1 CAPSULE EVERY DAY AS DIRECTED   cephALEXin  500 MG capsule Commonly known as: KEFLEX  Take 1 capsule (500 mg total) by mouth 2 (two) times daily for 1 day. X 1 day Start taking on: February 11, 2023   cyanocobalamin  1000 MCG tablet Take 1 tablet (1,000 mcg total) by mouth daily.   ferrous sulfate  325 (65 FE) MG EC tablet Take 325 mg by mouth daily with breakfast.   levothyroxine  88 MCG tablet Commonly known as: SYNTHROID  Take 88 mcg by mouth daily.   polyethylene glycol 17 g packet Commonly known as: MIRALAX  / GLYCOLAX  Take 17 g by mouth daily as needed for moderate constipation.   simvastatin  40 MG tablet Commonly known as: ZOCOR  Take 40 mg by mouth daily.   traMADol  50 MG tablet Commonly known as:  ULTRAM  Take 1 tablet (50 mg total) by mouth every 8 (eight) hours as needed for severe pain (pain score 7-10). What changed: when to take this               Discharge Care Instructions  (From admission, onward)           Start     Ordered   02/10/23 0000  Leave dressing on - Keep it clean, dry, and intact until clinic visit        02/10/23 1046            Follow-up Information     Wham, Adine BROCKS, MD. Schedule an appointment as soon as possible for a visit in 2 week(s).   Specialty: Orthopedic Surgery Why: for hospital follow-up, wound check and x-rays Contact information: [address] [place] [address] [phone]                Discharge Exam: Filed Weights   02/05/23 1537 02/05/23 1815 02/06/23 0822  Weight:  65.8 kg 67.7 kg 67.7 kg   S: Much more alert and oriented now, daughter at the bedside.  No acute complaints.  Waiting for PT to work with them today.  Has a bed at SNF.  BP (!) 168/83 (BP Location: Left Arm)   Pulse (!) 108   Temp 98.1 F (36.7 C) (Oral)   Resp 17   Ht 5' 4.02 (1.626 m)   Wt 67.7 kg   SpO2 99%   BMI 25.61 kg/m   Physical Exam General: Alert and oriented x 3, NAD Cardiovascular: S1 S2 clear, RRR.  Respiratory: CTAB, no wheezing Gastrointestinal: Soft, nontender, nondistended, NBS Ext: no pedal edema bilaterally Neuro: no new deficits Psych: Normal affect    Condition at discharge: fair  The results of significant diagnostics from this hospitalization (including imaging, microbiology, ancillary and laboratory) are listed below for reference.   Imaging Studies: DG HIP UNILAT WITH PELVIS 2-3 VIEWS LEFT Result Date: 02/06/2023 CLINICAL DATA:  Elective left hip surgery.  Intramedullary nail. EXAM: DG HIP (WITH OR WITHOUT PELVIS) 2-3V LEFT COMPARISON:  Left hip radiographs pelvis and left femur radiographs 02/05/2023 FINDINGS: Images were performed intraoperatively without the presence of a radiologist. The  patient is undergoing long cephalomedullary nail fixation of the previously seen comminuted proximal left femoral intertrochanteric proximal femoral fracture. Improved alignment. Partial visualization of total left knee arthroplasty. Total fluoroscopy images: 8 Total fluoroscopy time: 163 seconds Total dose: Radiation Exposure Index (as provided by the fluoroscopic device): 20.9 mGy air Kerma Please see intraoperative findings for further detail. IMPRESSION: Intraoperative fluoroscopic guidance for long cephalomedullary nail fixation of the previously seen comminuted proximal left femoral intertrochanteric proximal femoral fracture. Electronically Signed   By: Tanda Lyons M.D.   On: 02/06/2023 12:58   DG C-Arm 1-60 Min-No Report Result Date: 02/06/2023 Fluoroscopy was utilized by the requesting physician.  No radiographic interpretation.   CT HEAD WO CONTRAST ( ) Result Date: 02/05/2023 CLINICAL DATA:  Head trauma EXAM: CT HEAD WITHOUT CONTRAST TECHNIQUE: Contiguous axial images were obtained from the base of the skull through the vertex without intravenous contrast. RADIATION DOSE REDUCTION: This exam was performed according to the departmental dose-optimization program which includes automated exposure control, adjustment of the mA and/or kV according to patient size and/or use of iterative reconstruction technique. COMPARISON:  10/23/2019 CT head FINDINGS: Brain: No evidence of acute infarction, hemorrhage, mass, mass effect, or midline shift. No hydrocephalus or extra-axial fluid collection. Age related cerebral atrophy. Periventricular white matter changes, likely the sequela of chronic small vessel ischemic disease. Vascular: No hyperdense vessel. Atherosclerotic calcifications in the intracranial carotid and vertebral arteries. Skull: Negative for fracture or focal lesion. Sinuses/Orbits: Mucosal thickening in the right sphenoid sinus and posterior left ethmoid air cells. Status post bilateral lens  replacements. Other: The mastoid air cells are well aerated. IMPRESSION: No acute intracranial process. Electronically Signed   By: Donald Campion M.D.   On: 02/05/2023 15:46   DG Chest 1 View Result Date: 02/05/2023 CLINICAL DATA:  Fall. EXAM: CHEST  1 VIEW COMPARISON:  06/11/2019. FINDINGS: Bilateral lung fields are clear. Bilateral costophrenic angles are clear. Stable cardio-mediastinal silhouette. No acute osseous abnormalities. The soft tissues are within normal limits. IMPRESSION: No active disease. Electronically Signed   By: Ree Molt M.D.   On: 02/05/2023 14:52   DG Femur Min 2 Views Left Result Date: 02/05/2023 CLINICAL DATA:  Fall.  Suspected hip fracture. EXAM: LEFT FEMUR 2 VIEWS; PELVIS - 1-2 VIEW COMPARISON:  None Available. FINDINGS:  There is comminuted and angulated fracture of intertrochanteric left proximal femur. No other acute fracture or dislocation. No aggressive osseous lesion. Visualized sacral arcuate lines are unremarkable. Unremarkable symphysis pubis. There are mild degenerative changes of bilateral hip joints without significant joint space narrowing. Osteophytosis of the superior acetabulum. Patient is status post left knee arthroplasty. No radiopaque foreign bodies. IMPRESSION: *Comminuted and angulated fracture of the intertrochanteric left femur. Electronically Signed   By: Ree Molt M.D.   On: 02/05/2023 14:52   DG Pelvis 1-2 Views Result Date: 02/05/2023 CLINICAL DATA:  Fall.  Suspected hip fracture. EXAM: LEFT FEMUR 2 VIEWS; PELVIS - 1-2 VIEW COMPARISON:  None Available. FINDINGS: There is comminuted and angulated fracture of intertrochanteric left proximal femur. No other acute fracture or dislocation. No aggressive osseous lesion. Visualized sacral arcuate lines are unremarkable. Unremarkable symphysis pubis. There are mild degenerative changes of bilateral hip joints without significant joint space narrowing. Osteophytosis of the superior acetabulum. Patient  is status post left knee arthroplasty. No radiopaque foreign bodies. IMPRESSION: *Comminuted and angulated fracture of the intertrochanteric left femur. Electronically Signed   By: Ree Molt M.D.   On: 02/05/2023 14:52    Microbiology: Results for orders placed or performed during the hospital encounter of 02/05/23  Surgical pcr screen     Status: None   Collection Time: 02/06/23  4:38 AM   Specimen: Nasal Mucosa; Nasal Swab  Result Value Ref Range Status   MRSA, PCR NEGATIVE NEGATIVE Final   Staphylococcus aureus NEGATIVE NEGATIVE Final    Comment: (NOTE) The Xpert SA Assay (FDA approved for NASAL specimens in patients 20 years of age and older), is one component of a comprehensive surveillance program. It is not intended to diagnose infection nor to guide or monitor treatment. Performed at [facility], [address], [place], [place] [address]     Labs: CBC: Recent Labs  Lab 02/05/23 1353 02/06/23 0502 02/06/23 0801 02/07/23 0644 02/07/23 2008 02/08/23 0451 02/09/23 0616  WBC 9.9 10.7*  --  11.5*  --  14.3* 15.8*  NEUTROABS 7.0  --   --   --   --   --   --   HGB 12.0 7.8* 8.3* 6.1* 10.0* 10.4* 10.3*  HCT 37.8 24.5* 26.0* 19.4* 30.0* 30.2* 30.2*  MCV 88.9 90.1  --  91.9  --  86.5 85.1  PLT 162 136*  --  109*  --  107* 134*   Basic Metabolic Panel: Recent Labs  Lab 02/05/23 1353 02/06/23 0502 02/07/23 0644 02/08/23 0451 02/09/23 0616  NA 142 140 137 139 136  K 3.8 4.6 4.7 4.8 4.6  CL 113* 110 109 110 105  CO2 22 20* 19* 19* 19*  GLUCOSE 123* 107* 115* 100* 117*  BUN 23 33* 41* 49* 51*  CREATININE 1.35* 1.53* 2.47* 2.38* 1.77*  CALCIUM  9.1 8.5* 8.0* 8.3* 8.9   Liver Function Tests: No results for input(s): AST, ALT, ALKPHOS, BILITOT, PROT, ALBUMIN  in the last 168 hours. CBG: Recent Labs  Lab 02/05/23 2106  GLUCAP 181*    Discharge time spent: greater than 30 minutes.  Signed: Nydia Distance, MD [facility] 02/10/2023

## 2023-02-10 NOTE — Progress Notes (Signed)
.  Subjective: 4 Days Post-Op Procedure(s) (LRB): INTRAMEDULLARY (IM) NAIL INTERTROCHANTERIC (Left)  Patient states pain still significant in LLE but able to work better with PT. Hgb stabilized   Activity level:  WBAT LLE Diet tolerance:  as tolerated Patient reports pain as 6 on 0-10 scale.    Objective: Vital signs in last 24 hours: Temp:  [98 F (36.7 C)-98.1 F (36.7 C)] 98.1 F (36.7 C) (02/05 0420) Pulse Rate:  [100-108] 108 (02/05 0819) Resp:  [17] 17 (02/05 0819) BP: (138-168)/(71-83) 168/83 (02/05 0819) SpO2:  [99 %-100 %] 99 % (02/05 0819)  Labs: Recent Labs    02/07/23 2008 02/08/23 0451 02/09/23 0616 02/10/23 1058  HGB 10.0* 10.4* 10.3* 11.3*   Recent Labs    02/09/23 0616 02/10/23 1058  WBC 15.8* 17.7*  RBC 3.55* 3.84*  HCT 30.2* 32.8*  PLT 134* 168   Recent Labs    02/09/23 0616 02/10/23 1058  NA 136 135  K 4.6 4.4  CL 105 105  CO2 19* 19*  BUN 51* 47*  CREATININE 1.77* 1.31*  GLUCOSE 117* 115*  CALCIUM  8.9 9.0   No results for input(s): LABPT, INR in the last 72 hours.  Physical Exam:   Neurologically intact ABD soft Neurovascular intact Sensation intact distally Intact pulses distally Dorsiflexion/Plantar flexion intact Incision: proximal incisions CDI, dressing changed Distal knee dressing changed. Skin tear site with red granulation tissue, healthy appearing without signs of infection or drainage  Assessment/Plan:  4 Days Post-Op Procedure(s) (LRB): INTRAMEDULLARY (IM) NAIL INTERTROCHANTERIC (Left)  WBAT LLE Hgb stabilized after transfusions. Likely due to acute blood loss anemia from fracture and surgery. Received 2u pRBCs. Continue to trend CBC and transfuse as needed Pain control with PRN pain medication preferring oral medicines  ASA 81 mg BID for DVT ppx Recommend dressing changes to lateral leg at skin tear site every other day. Recommend dressing with xeroform or adaptic, gauze and ace wrap Planned for DC today to  SNF. Patient can follow up in the office 2 weeks post of for wound check and XR left femur   Adine JAYSON Mon 02/10/2023, 1:30 PM

## 2023-02-10 NOTE — TOC Transition Note (Signed)
 Transition of Care [facility]) - Discharge Note   Patient Details  Name: Sharon Bailey MRN: [id number] Date of Birth: 03-Nov-1930  [facility]) CM/SW Contact:  Bridget Cordella Simmonds, LCSW Phone Number: 02/10/2023, 11:49 AM   Clinical Narrative:   Pt discharging to [facility], room 106. RN call report to [phone].    Final next level of care: Skilled Nursing Facility Barriers to Discharge: Barriers Resolved   Patient Goals and CMS Choice Patient states their goals for this hospitalization and ongoing recovery are:: get back on my feet CMS Medicare.gov Compare Post Acute Care list provided to:: Patient Represenative (must comment) (daughter [name]) Choice offered to / list presented to : Adult Children      Discharge Placement              Patient chooses bed at:  ([facility]) Patient to be transferred to facility by: [facility] Name of family member notified: daughter [name] Patient and family notified of of transfer: 02/10/23  Discharge Plan and Services Additional resources added to the After Visit Summary for   In-house Referral: Clinical Social Work   Post Acute Care Choice: Skilled Nursing Facility                               Social Drivers of Health (SDOH) Interventions SDOH Screenings   Food Insecurity: No Food Insecurity (02/05/2023)  Housing: Low Risk  (02/05/2023)  Transportation Needs: No Transportation Needs (02/05/2023)  Utilities: Not At Risk (02/05/2023)  Depression (PHQ2-9): Low Risk  (02/21/2022)  Social Connections: Moderately Integrated (02/05/2023)  Tobacco Use: Low Risk  (02/06/2023)     Readmission Risk Interventions     No data to display

## 2023-02-10 NOTE — TOC Progression Note (Signed)
 Transition of Care [facility]) - Progression Note    Patient Details  Name: Sharon Bailey MRN: [id number] Date of Birth: 1930/11/10  [facility]) CM/SW Contact  Bridget Cordella Simmonds, LCSW Phone Number: 02/10/2023, 9:52 AM  Clinical Narrative:  SNF auth request submitted in navi and approved: [id number], 3 days: 2/5-2/7.    CSW confirmed with [facility] that they can receive pt today.  MD informed.      Expected Discharge Plan: Skilled Nursing Facility Barriers to Discharge: Continued Medical Work up, SNF Pending bed offer  Expected Discharge Plan and Services In-house Referral: Clinical Social Work   Post Acute Care Choice: Skilled Nursing Facility Living arrangements for the past 2 months: Single Family Home                                       Social Determinants of Health (SDOH) Interventions SDOH Screenings   Food Insecurity: No Food Insecurity (02/05/2023)  Housing: Low Risk  (02/05/2023)  Transportation Needs: No Transportation Needs (02/05/2023)  Utilities: Not At Risk (02/05/2023)  Depression (PHQ2-9): Low Risk  (02/21/2022)  Social Connections: Moderately Integrated (02/05/2023)  Tobacco Use: Low Risk  (02/06/2023)    Readmission Risk Interventions     No data to display

## 2023-02-10 NOTE — Plan of Care (Signed)

## 2023-02-10 NOTE — Progress Notes (Addendum)
 AVS placed in DC packet.  Dressings changed.  IV removed.  Patient Excited For discharge.  Report Called to [employer].  Wham MD advised to change surgical dressing prior to DC

## 2023-02-10 NOTE — Care Management Important Message (Signed)
 Important Message  Patient Details  Name: Sharon Bailey MRN: [id number] Date of Birth: [birth year]   Important Message Given:  Yes - Medicare IM     Wynonia Hedges 02/10/2023, 2:56 PM

## 2023-02-17 DIAGNOSIS — R220 Localized swelling, mass and lump, head: Secondary | ICD-10-CM | POA: Diagnosis not present

## 2023-02-23 ENCOUNTER — Emergency Department (HOSPITAL_BASED_OUTPATIENT_CLINIC_OR_DEPARTMENT_OTHER)
Admit: 2023-02-23 | Discharge: 2023-02-23 | Disposition: A | Discharge status: Home / Self Care | Payer: Medicare PPO | Attending: Emergency Medicine

## 2023-02-23 ENCOUNTER — Emergency Department (HOSPITAL_COMMUNITY): Payer: Medicare PPO

## 2023-02-23 ENCOUNTER — Other Ambulatory Visit: Payer: Self-pay

## 2023-02-23 ENCOUNTER — Encounter (HOSPITAL_COMMUNITY): Payer: Self-pay

## 2023-02-23 ENCOUNTER — Observation Stay (HOSPITAL_COMMUNITY)
Admission: EM | Admit: 2023-02-23 | Discharge: 2023-02-24 | Disposition: A | Discharge status: Skilled Nursing Facility | Payer: Medicare PPO | Attending: Internal Medicine | Admitting: Internal Medicine

## 2023-02-23 DIAGNOSIS — L03116 Cellulitis of left lower limb: Secondary | ICD-10-CM | POA: Diagnosis not present

## 2023-02-23 DIAGNOSIS — G8929 Other chronic pain: Secondary | ICD-10-CM | POA: Diagnosis not present

## 2023-02-23 DIAGNOSIS — I82621 Acute embolism and thrombosis of deep veins of right upper extremity: Principal | ICD-10-CM

## 2023-02-23 DIAGNOSIS — E46 Unspecified protein-calorie malnutrition: Secondary | ICD-10-CM | POA: Diagnosis not present

## 2023-02-23 DIAGNOSIS — I129 Hypertensive chronic kidney disease with stage 1 through stage 4 chronic kidney disease, or unspecified chronic kidney disease: Secondary | ICD-10-CM | POA: Insufficient documentation

## 2023-02-23 DIAGNOSIS — R601 Generalized edema: Secondary | ICD-10-CM

## 2023-02-23 DIAGNOSIS — K52832 Lymphocytic colitis: Secondary | ICD-10-CM | POA: Insufficient documentation

## 2023-02-23 DIAGNOSIS — L0390 Cellulitis, unspecified: Secondary | ICD-10-CM | POA: Insufficient documentation

## 2023-02-23 DIAGNOSIS — N1832 Chronic kidney disease, stage 3b: Secondary | ICD-10-CM | POA: Diagnosis not present

## 2023-02-23 DIAGNOSIS — M109 Gout, unspecified: Secondary | ICD-10-CM | POA: Insufficient documentation

## 2023-02-23 DIAGNOSIS — R0902 Hypoxemia: Secondary | ICD-10-CM | POA: Diagnosis not present

## 2023-02-23 DIAGNOSIS — E039 Hypothyroidism, unspecified: Secondary | ICD-10-CM | POA: Insufficient documentation

## 2023-02-23 DIAGNOSIS — M7989 Other specified soft tissue disorders: Secondary | ICD-10-CM | POA: Diagnosis not present

## 2023-02-23 DIAGNOSIS — M545 Low back pain, unspecified: Secondary | ICD-10-CM | POA: Diagnosis not present

## 2023-02-23 DIAGNOSIS — Z96652 Presence of left artificial knee joint: Secondary | ICD-10-CM | POA: Insufficient documentation

## 2023-02-23 DIAGNOSIS — L03113 Cellulitis of right upper limb: Secondary | ICD-10-CM

## 2023-02-23 DIAGNOSIS — R600 Localized edema: Secondary | ICD-10-CM | POA: Diagnosis not present

## 2023-02-23 DIAGNOSIS — Z79899 Other long term (current) drug therapy: Secondary | ICD-10-CM | POA: Diagnosis not present

## 2023-02-23 DIAGNOSIS — Z7982 Long term (current) use of aspirin: Secondary | ICD-10-CM | POA: Diagnosis not present

## 2023-02-23 DIAGNOSIS — I10 Essential (primary) hypertension: Secondary | ICD-10-CM | POA: Diagnosis not present

## 2023-02-23 DIAGNOSIS — I700 Atherosclerosis of aorta: Secondary | ICD-10-CM | POA: Diagnosis not present

## 2023-02-23 DIAGNOSIS — R609 Edema, unspecified: Secondary | ICD-10-CM | POA: Diagnosis not present

## 2023-02-23 DIAGNOSIS — K449 Diaphragmatic hernia without obstruction or gangrene: Secondary | ICD-10-CM | POA: Diagnosis not present

## 2023-02-23 DIAGNOSIS — M25421 Effusion, right elbow: Secondary | ICD-10-CM | POA: Diagnosis not present

## 2023-02-23 DIAGNOSIS — I8290 Acute embolism and thrombosis of unspecified vein: Secondary | ICD-10-CM

## 2023-02-23 DIAGNOSIS — I959 Hypotension, unspecified: Secondary | ICD-10-CM | POA: Diagnosis not present

## 2023-02-23 LAB — COMPREHENSIVE METABOLIC PANEL
ALT: 127 U/L — ABNORMAL HIGH (ref 0–44)
AST: 111 U/L — ABNORMAL HIGH (ref 15–41)
Albumin: 2.3 g/dL — ABNORMAL LOW (ref 3.5–5.0)
Alkaline Phosphatase: 91 U/L (ref 38–126)
Anion gap: 9 (ref 5–15)
BUN: 65 mg/dL — ABNORMAL HIGH (ref 8–23)
CO2: 20 mmol/L — ABNORMAL LOW (ref 22–32)
Calcium: 8.6 mg/dL — ABNORMAL LOW (ref 8.9–10.3)
Chloride: 109 mmol/L (ref 98–111)
Creatinine, Ser: 1.28 mg/dL — ABNORMAL HIGH (ref 0.44–1.00)
GFR, Estimated: 40 mL/min — ABNORMAL LOW (ref >60)
Glucose, Bld: 108 mg/dL — ABNORMAL HIGH (ref 70–99)
Potassium: 4.3 mmol/L (ref 3.5–5.1)
Sodium: 138 mmol/L (ref 135–145)
Total Bilirubin: 1.1 mg/dL (ref 0.0–1.2)
Total Protein: 5.5 g/dL — ABNORMAL LOW (ref 6.5–8.1)

## 2023-02-23 LAB — CBC WITH DIFFERENTIAL/PLATELET
Abs Immature Granulocytes: 0.45 10*3/uL — ABNORMAL HIGH (ref 0.00–0.07)
Basophils Absolute: 0.1 10*3/uL (ref 0.0–0.1)
Basophils Relative: 0 %
Eosinophils Absolute: 0.1 10*3/uL (ref 0.0–0.5)
Eosinophils Relative: 0 %
HCT: 31 % — ABNORMAL LOW (ref 36.0–46.0)
Hemoglobin: 9.9 g/dL — ABNORMAL LOW (ref 12.0–15.0)
Immature Granulocytes: 2 %
Lymphocytes Relative: 8 %
Lymphs Abs: 1.4 10*3/uL (ref 0.7–4.0)
MCH: 28.9 pg (ref 26.0–34.0)
MCHC: 31.9 g/dL (ref 30.0–36.0)
MCV: 90.6 fL (ref 80.0–100.0)
Monocytes Absolute: 1.1 10*3/uL — ABNORMAL HIGH (ref 0.1–1.0)
Monocytes Relative: 6 %
Neutro Abs: 15.3 10*3/uL — ABNORMAL HIGH (ref 1.7–7.7)
Neutrophils Relative %: 84 %
Platelets: 415 10*3/uL — ABNORMAL HIGH (ref 150–400)
RBC: 3.42 MIL/uL — ABNORMAL LOW (ref 3.87–5.11)
RDW: 15.1 % (ref 11.5–15.5)
WBC: 18.4 10*3/uL — ABNORMAL HIGH (ref 4.0–10.5)
nRBC: 0.1 % (ref 0.0–0.2)

## 2023-02-23 LAB — BRAIN NATRIURETIC PEPTIDE: B Natriuretic Peptide: 121.9 pg/mL — ABNORMAL HIGH (ref 0.0–100.0)

## 2023-02-23 LAB — MAGNESIUM: Magnesium: 1.6 mg/dL — ABNORMAL LOW (ref 1.7–2.4)

## 2023-02-23 LAB — CG4 I-STAT (LACTIC ACID)
Lactic Acid, Venous: 0.9 mmol/L (ref 0.5–1.9)
Lactic Acid, Venous: <0.3 mmol/L — ABNORMAL LOW (ref 0.5–1.9)

## 2023-02-23 LAB — TROPONIN I (HIGH SENSITIVITY)
Troponin I (High Sensitivity): 10 ng/L (ref <18)
Troponin I (High Sensitivity): 8 ng/L (ref <18)

## 2023-02-23 MED ORDER — ASPIRIN 81 MG PO TBEC
81.0000 mg | DELAYED_RELEASE_TABLET | Freq: Every day | ORAL | Status: DC
  Administered 2023-02-24: 81 mg via ORAL
  Filled 2023-02-23: qty 1

## 2023-02-23 MED ORDER — SODIUM CHLORIDE (PF) 0.9 % IJ SOLN
INTRAMUSCULAR | Status: AC
  Filled 2023-02-23: qty 50

## 2023-02-23 MED ORDER — MAGNESIUM SULFATE 2 GM/50ML IV SOLN
2.0000 g | Freq: Once | INTRAVENOUS | Status: AC
  Administered 2023-02-23: 2 g via INTRAVENOUS
  Filled 2023-02-23: qty 50

## 2023-02-23 MED ORDER — ENOXAPARIN SODIUM 30 MG/0.3ML IJ SOSY
30.0000 mg | PREFILLED_SYRINGE | INTRAMUSCULAR | Status: DC
  Administered 2023-02-23: 30 mg via SUBCUTANEOUS
  Filled 2023-02-23: qty 0.3

## 2023-02-23 MED ORDER — SIMVASTATIN 40 MG PO TABS
40.0000 mg | ORAL_TABLET | Freq: Every evening | ORAL | Status: DC
  Administered 2023-02-23: 40 mg via ORAL
  Filled 2023-02-23: qty 1

## 2023-02-23 MED ORDER — ALLOPURINOL 300 MG PO TABS
150.0000 mg | ORAL_TABLET | Freq: Every day | ORAL | Status: DC
  Administered 2023-02-24: 150 mg via ORAL
  Filled 2023-02-23: qty 1

## 2023-02-23 MED ORDER — MELATONIN 3 MG PO TABS: 6.0000 mg | ORAL_TABLET | Freq: Every evening | ORAL | Status: DC | PRN

## 2023-02-23 MED ORDER — FUROSEMIDE 10 MG/ML IJ SOLN
20.0000 mg | Freq: Once | INTRAMUSCULAR | Status: AC
  Administered 2023-02-23: 20 mg via INTRAVENOUS
  Filled 2023-02-23: qty 4

## 2023-02-23 MED ORDER — VITAMIN B-12 1000 MCG PO TABS
1000.0000 ug | ORAL_TABLET | Freq: Every day | ORAL | Status: DC
  Administered 2023-02-24: 1000 ug via ORAL
  Filled 2023-02-23: qty 1

## 2023-02-23 MED ORDER — CEPHALEXIN 500 MG PO CAPS: 500.0000 mg | ORAL_CAPSULE | Freq: Three times a day (TID) | ORAL | Status: DC

## 2023-02-23 MED ORDER — ALBUTEROL SULFATE (2.5 MG/3ML) 0.083% IN NEBU: 2.5000 mg | INHALATION_SOLUTION | RESPIRATORY_TRACT | Status: DC | PRN

## 2023-02-23 MED ORDER — ONDANSETRON HCL 4 MG/2ML IJ SOLN: 4.0000 mg | Freq: Four times a day (QID) | INTRAMUSCULAR | Status: DC | PRN

## 2023-02-23 MED ORDER — SODIUM CHLORIDE 0.9 % IV SOLN
2.0000 g | Freq: Once | INTRAVENOUS | Status: AC
  Administered 2023-02-23: 2 g via INTRAVENOUS
  Filled 2023-02-23: qty 20

## 2023-02-23 MED ORDER — FERROUS SULFATE 325 (65 FE) MG PO TABS
325.0000 mg | ORAL_TABLET | Freq: Every day | ORAL | Status: DC
  Administered 2023-02-24: 325 mg via ORAL
  Filled 2023-02-23: qty 1

## 2023-02-23 MED ORDER — LEVOTHYROXINE SODIUM 88 MCG PO TABS
88.0000 ug | ORAL_TABLET | Freq: Every day | ORAL | Status: DC
  Administered 2023-02-24: 88 ug via ORAL
  Filled 2023-02-23: qty 1

## 2023-02-23 MED ORDER — IOHEXOL 350 MG/ML SOLN
60.0000 mL | Freq: Once | INTRAVENOUS | Status: AC | PRN
  Administered 2023-02-23: 60 mL via INTRAVENOUS

## 2023-02-23 MED ORDER — BUDESONIDE 3 MG PO CPEP
3.0000 mg | ORAL_CAPSULE | Freq: Every day | ORAL | Status: DC
  Administered 2023-02-24: 3 mg via ORAL
  Filled 2023-02-23: qty 1

## 2023-02-23 MED ORDER — PHENOL 1.4 % MT LIQD: 1.0000 | OROMUCOSAL | Status: DC | PRN

## 2023-02-23 MED ORDER — POLYETHYLENE GLYCOL 3350 17 G PO PACK: 17.0000 g | PACK | Freq: Every day | ORAL | Status: DC | PRN

## 2023-02-23 MED ORDER — ACETAMINOPHEN 500 MG PO TABS: 1000.0000 mg | ORAL_TABLET | Freq: Four times a day (QID) | ORAL | Status: DC | PRN

## 2023-02-23 NOTE — ED Notes (Signed)
ED TO INPATIENT HANDOFF REPORT  Name/Age/Gender Sharon Bailey 89 y.o. female  Code Status    Code Status Orders  (From admission, onward)           Start     Ordered   02/23/23 2030  Do not attempt resuscitation (DNR)- Limited -Do Not Intubate (DNI)  Continuous       Question Answer Comment  If pulseless and not breathing No CPR or chest compressions.   In Pre-Arrest Conditions (Patient Is Breathing and Has A Pulse) Do not intubate. Provide all appropriate non-invasive medical interventions. Avoid ICU transfer unless indicated or required.   Consent: Discussion documented in EHR or advanced directives reviewed      02/23/23 2030           Code Status History     Date Active Date Inactive Code Status Order ID Comments User Context   02/05/2023 1620 02/10/2023 1854 Full Code [id number]  Synetta Fail, MD ED   06/11/2019 2156 06/13/2019 1809 DNR [id number]  John Giovanni, MD ED   06/11/2019 2129 06/11/2019 2156 Full Code [id number]  John Giovanni, MD ED      Advance Directive Documentation    Flowsheet Row Most Recent Value  Type of Advance Directive Out of facility DNR (pink MOST or yellow form)  Pre-existing out of facility DNR order (yellow form or pink MOST form) Pink MOST/Yellow Form most recent copy in chart - Physician notified to receive inpatient order  "MOST" Form in Place? --       Home/SNF/Other Home  Chief Complaint Acute deep vein thrombosis (DVT) of right upper extremity (HCC) [I82.621]  Level of Care/Admitting Diagnosis ED Disposition     ED Disposition  Admit   Condition  --   Comment  Hospital Area: [facility] [[id number]]  Level of Care: Med-Surg [16]  May place patient in observation at [facility] or [facility] if equivalent level of care is available:: No  Covid Evaluation: Asymptomatic - no recent exposure (last 10 days) testing not required  Diagnosis: Acute deep vein thrombosis (DVT) of right upper extremity  [facility]) [[id number]]  Admitting Physician: Dolly Rias [[id number]]  Attending Physician: Dolly Rias [[id number]]          Medical History Past Medical History:  Diagnosis Date   Abnormal EKG    Acute CVA (cerebrovascular accident) (HCC) 06/11/2019   AKI (acute kidney injury) (HCC) 06/11/2019   Collagenous colitis    Collagenous colitis    Diverticulitis    Diverticulosis    Fluttering heart    GERD (gastroesophageal reflux disease)    Gout    Gout attack 09/28/2012   Hammer toe of left foot 09/28/2012   Hyperlipidemia    Hypertension    Hypertensive emergency    Hypothyroidism    Irregular heart beat    Pain in joint, ankle and foot 09/28/2012   Status post foot surgery 10/11/2012   Thyroid disease     Allergies Allergies  Allergen Reactions   Bactrim [Sulfamethoxazole-Trimethoprim] Diarrhea and Nausea Only   Cipro [Ciprofloxacin Hcl] Hives    IV Location/Drains/Wounds Patient Lines/Drains/Airways Status     Active Line/Drains/Airways     Name Placement date Placement time Site Days   Peripheral IV 02/23/23 22 G Left Antecubital 02/23/23  1339  Antecubital  less than 1   Peripheral IV 02/23/23 20 G Right Antecubital 02/23/23  1616  Antecubital  less than 1   Wound / Incision (Open or Dehisced) 02/05/23 Skin  tear Arm Left;Mid;Upper;Lower Bleeding, purple, bruised, covering most of arm 02/05/23  1829  Arm  18            Labs/Imaging Results for orders placed or performed during the hospital encounter of 02/23/23 (from the past 48 hours)  Comprehensive metabolic panel     Status: Abnormal   Collection Time: 02/23/23  3:20 PM  Result Value Ref Range   Sodium 138 135 - 145 mmol/L   Potassium 4.3 3.5 - 5.1 mmol/L   Chloride 109 98 - 111 mmol/L   CO2 20 (L) 22 - 32 mmol/L   Glucose, Bld 108 (H) 70 - 99 mg/dL    Comment: Glucose reference range applies only to samples taken after fasting for at least 8 hours.   BUN 65 (H) 8 - 23 mg/dL   Creatinine, Ser  1.61 (H) 0.44 - 1.00 mg/dL   Calcium 8.6 (L) 8.9 - 10.3 mg/dL   Total Protein 5.5 (L) 6.5 - 8.1 g/dL   Albumin 2.3 (L) 3.5 - 5.0 g/dL   AST 096 (H) 15 - 41 U/L   ALT 127 (H) 0 - 44 U/L   Alkaline Phosphatase 91 38 - 126 U/L   Total Bilirubin 1.1 0.0 - 1.2 mg/dL   GFR, Estimated 40 (L) >60 mL/min    Comment: (NOTE) Calculated using the CKD-EPI Creatinine Equation (2021)    Anion gap 9 5 - 15    Comment: Performed at [facility], [address] [place], [place] [address]  Brain natriuretic peptide     Status: Abnormal   Collection Time: 02/23/23  3:20 PM  Result Value Ref Range   B Natriuretic Peptide 121.9 (H) 0.0 - 100.0 pg/mL    Comment: Performed at [facility], [address], [place], [place] [address]  Magnesium     Status: Abnormal   Collection Time: 02/23/23  3:20 PM  Result Value Ref Range   Magnesium 1.6 (L) 1.7 - 2.4 mg/dL    Comment: Performed at [facility], [address], [place], [place] [address]  CBC with Differential     Status: Abnormal   Collection Time: 02/23/23  3:20 PM  Result Value Ref Range   WBC 18.4 (H) 4.0 - 10.5 K/uL   RBC 3.42 (L) 3.87 - 5.11 MIL/uL   Hemoglobin 9.9 (L) 12.0 - 15.0 g/dL   HCT 95.6 (L) 21.3 - 08.6 %   MCV 90.6 80.0 - 100.0 fL   MCH 28.9 26.0 - 34.0 pg   MCHC 31.9 30.0 - 36.0 g/dL   RDW 57.8 46.9 - 62.9 %   Platelets 415 (H) 150 - 400 K/uL   nRBC 0.1 0.0 - 0.2 %   Neutrophils Relative % 84 %   Neutro Abs 15.3 (H) 1.7 - 7.7 K/uL   Lymphocytes Relative 8 %   Lymphs Abs 1.4 0.7 - 4.0 K/uL   Monocytes Relative 6 %   Monocytes Absolute 1.1 (H) 0.1 - 1.0 K/uL   Eosinophils Relative 0 %   Eosinophils Absolute 0.1 0.0 - 0.5 K/uL   Basophils Relative 0 %   Basophils Absolute 0.1 0.0 - 0.1 K/uL   Immature Granulocytes 2 %   Abs Immature Granulocytes 0.45 (H) 0.00 - 0.07 K/uL    Comment: Performed at [facility], [address] [place], [place] [address]   Troponin I (High Sensitivity)     Status: None   Collection Time: 02/23/23  3:20 PM  Result Value Ref Range   Troponin I (High Sensitivity) 10 <18 ng/L    Comment: (NOTE) Elevated high sensitivity troponin I (hsTnI) values and significant  changes across serial measurements may suggest ACS but many other  chronic and acute conditions are known to elevate hsTnI results.  Refer to the "Links" section for chest pain algorithms and additional  guidance. Performed at [facility], [address] [place], [place] [address]   Troponin I (High Sensitivity)     Status: None   Collection Time: 02/23/23  5:00 PM  Result Value Ref Range   Troponin I (High Sensitivity) 8 <18 ng/L    Comment: (NOTE) Elevated high sensitivity troponin I (hsTnI) values and significant  changes across serial measurements may suggest ACS but many other  chronic and acute conditions are known to elevate hsTnI results.  Refer to the "Links" section for chest pain algorithms and additional  guidance. Performed at [facility], [address] [place], [place] [address]    CT Angio Chest PE W and/or Wo Contrast Result Date: 02/23/2023 CLINICAL DATA:  Elbow and leg redness and swelling history of femur fracture hypoxia EXAM: CT ANGIOGRAPHY CHEST WITH CONTRAST TECHNIQUE: Multidetector CT imaging of the chest was performed using the standard protocol during bolus administration of intravenous contrast. Multiplanar CT image reconstructions and MIPs were obtained to evaluate the vascular anatomy. RADIATION DOSE REDUCTION: This exam was performed according to the departmental dose-optimization program which includes automated exposure control, adjustment of the mA and/or kV according to patient size and/or use of iterative reconstruction technique. CONTRAST:  60mL OMNIPAQUE IOHEXOL 350 MG/ML SOLN COMPARISON:  Chest x-ray 02/23/2023 FINDINGS: Cardiovascular: Satisfactory opacification of the  pulmonary arteries to the segmental level. No evidence of pulmonary embolism. Nonaneurysmal aorta. No dissection. Direct origin of left vertebral artery from the aortic arch. Moderate atherosclerosis. Coronary vascular calcification. Normal cardiac size. No pericardial effusion Mediastinum/Nodes: Patent trachea. No suspicious thyroid mass. No suspicious lymph nodes. Mild fluid distension of the esophagus. Circumferential wall thickening of the mid to distal esophagus with mild diffuse mucosal enhancement. Small moderate hiatal hernia with thickening at the GE junction. Lungs/Pleura: No acute airspace disease, pleural effusion, or pneumothorax. Upper Abdomen: Cholecystectomy. Prominent common bile duct likely due to surgical change. No acute finding Musculoskeletal: Degenerative changes. No acute osseous abnormality. Review of the MIP images confirms the above findings. IMPRESSION: 1. Negative for acute pulmonary embolus or aortic dissection. 2. No CT evidence for acute pulmonary abnormality. 3. Circumferential wall thickening of the mid to distal esophagus with mild diffuse mucosal enhancement, findings suggestive of esophagitis. Small to moderate hiatal hernia with thickening at the GE junction, cannot exclude mass and consider correlation with endoscopy 4. Aortic atherosclerosis. Aortic Atherosclerosis (ICD10-I70.0). Electronically Signed   By: Jasmine Pang M.D.   On: 02/23/2023 18:25   DG CHEST PORT 1 VIEW Result Date: 02/23/2023 CLINICAL DATA:  Hypoxia. EXAM: PORTABLE CHEST 1 VIEW COMPARISON:  Chest radiograph dated 02/05/2023. FINDINGS: The heart size and mediastinal contours are within normal limits. Aortic atherosclerosis. No focal consolidation, pleural effusion, or pneumothorax. No acute osseous abnormality. IMPRESSION: No acute cardiopulmonary findings. Electronically Signed   By: Hart Robinsons M.D.   On: 02/23/2023 17:23   VAS [place] LOWER EXTREMITY VENOUS (DVT) (ONLY [facility] & [facility]) Result Date:  02/23/2023  Lower Venous DVT Study Patient Name:  SAMONE GUHL  Date of Exam:   02/23/2023 Medical Rec #: [id number]       Accession #:  [id number] Date of Birth: March 19, 1930       Patient Gender: F Patient Age:   50 years Exam Location:  [facility] Procedure:      VAS [place] LOWER EXTREMITY VENOUS (DVT) Referring Phys: Alvira Monday --------------------------------------------------------------------------------  Indications: Swelling.  Risk Factors: Trauma. Limitations: Body habitus, poor ultrasound/tissue interface and patient positioning, patient pain tolerance. Comparison Study: No prior studies. Performing Technologist: Chanda Busing RVT  Examination Guidelines: A complete evaluation includes B-mode imaging, spectral Doppler, color Doppler, and power Doppler as needed of all accessible portions of each vessel. Bilateral testing is considered an integral part of a complete examination. Limited examinations for reoccurring indications may be performed as noted. The reflux portion of the exam is performed with the patient in reverse Trendelenburg.  +-----+---------------+---------+-----------+----------+--------------+ RIGHTCompressibilityPhasicitySpontaneityPropertiesThrombus Aging +-----+---------------+---------+-----------+----------+--------------+ CFV  Full           Yes      Yes                                 +-----+---------------+---------+-----------+----------+--------------+   +---------+---------------+---------+-----------+----------+--------------+ LEFT     CompressibilityPhasicitySpontaneityPropertiesThrombus Aging +---------+---------------+---------+-----------+----------+--------------+ CFV      Full           Yes      Yes                                 +---------+---------------+---------+-----------+----------+--------------+ SFJ      Full                                                         +---------+---------------+---------+-----------+----------+--------------+ FV Prox  Full                                                        +---------+---------------+---------+-----------+----------+--------------+ FV Mid                  Yes      Yes                                 +---------+---------------+---------+-----------+----------+--------------+ FV Distal               Yes      Yes                                 +---------+---------------+---------+-----------+----------+--------------+ PFV      Full                                                        +---------+---------------+---------+-----------+----------+--------------+ POP      Full           Yes      Yes                                 +---------+---------------+---------+-----------+----------+--------------+  PTV      Full                                                        +---------+---------------+---------+-----------+----------+--------------+ PERO     Full                                                        +---------+---------------+---------+-----------+----------+--------------+     Summary: RIGHT: - No evidence of common femoral vein obstruction.   LEFT: - There is no evidence of deep vein thrombosis in the lower extremity. However, portions of this examination were limited- see technologist comments above.  - No cystic structure found in the popliteal fossa.  *See table(s) above for measurements and observations. Electronically signed by Lemar Livings MD on 02/23/2023 at 4:34:58 PM.    Final     Pending Labs Unresulted Labs (From admission, onward)     Start     Ordered   02/24/23 0500  Basic metabolic panel  Tomorrow morning,   R        02/23/23 2030   02/24/23 0500  CBC  Tomorrow morning,   R        02/23/23 2030   02/24/23 0500  Magnesium  Tomorrow morning,   R        02/23/23 2030   02/24/23 0500  Phosphorus  Tomorrow morning,   R        02/23/23 2030    02/23/23 2100  TSH  Add-on,   AD        02/23/23 2059   02/23/23 2100  Urinalysis, Routine w reflex microscopic -Urine, Clean Catch  Once,   R       Question:  Specimen Source  Answer:  Urine, Clean Catch   02/23/23 2059   02/23/23 2100  Protein / creatinine ratio, urine  Once,   R        02/23/23 2059   02/23/23 1451  Blood culture (routine x 2)  BLOOD CULTURE X 2,   R (with STAT occurrences)      02/23/23 1450            Vitals/Pain Today's Vitals   02/23/23 1745 02/23/23 1800 02/23/23 1934 02/23/23 2000  BP:  118/64  134/60  Pulse: 81 85  94  Resp: 16 18  19   Temp:    (!) 97.3 F (36.3 C)  TempSrc:    Oral  SpO2: 100% 100%  100%  Weight:   64 kg   PainSc:        Isolation Precautions No active isolations  Medications Medications  enoxaparin (LOVENOX) injection 30 mg (has no administration in time range)  acetaminophen (TYLENOL) tablet 1,000 mg (has no administration in time range)  melatonin tablet 6 mg (has no administration in time range)  albuterol (PROVENTIL) (2.5 MG/3ML) 0.083% nebulizer solution 2.5 mg (has no administration in time range)  polyethylene glycol (MIRALAX / GLYCOLAX) packet 17 g (has no administration in time range)  ondansetron (ZOFRAN) injection 4 mg (has no administration in time range)  cephALEXin (KEFLEX) capsule 500 mg (has no administration in time range)  allopurinol (ZYLOPRIM) tablet 150  mg (has no administration in time range)  aspirin EC tablet 81 mg (has no administration in time range)  budesonide (ENTOCORT EC) 24 hr capsule 3 mg (has no administration in time range)  ferrous sulfate EC tablet 325 mg (has no administration in time range)  levothyroxine (SYNTHROID) tablet 88 mcg (has no administration in time range)  simvastatin (ZOCOR) tablet 40 mg (has no administration in time range)  cyanocobalamin (VITAMIN B12) tablet 1,000 mcg (has no administration in time range)  phenol (CHLORASEPTIC) mouth spray 1 spray (has no  administration in time range)  iohexol (OMNIPAQUE) 350 MG/ML injection 60 mL (60 mLs Intravenous Contrast Given 02/23/23 1625)  cefTRIAXone (ROCEPHIN) 2 g in sodium chloride 0.9 % 100 mL IVPB (2 g Intravenous New Bag/Given 02/23/23 1958)  furosemide (LASIX) injection 20 mg (20 mg Intravenous Given 02/23/23 1954)    Mobility non-ambulatory

## 2023-02-23 NOTE — Progress Notes (Signed)
Per Dr. Lazarus Salines, hold off on IV heparin at this time. Heparin per pharmacy consult discontinued by MD.   Greer Pickerel, PharmD, BCPS Clinical Pharmacist 02/23/2023 7:49 PM

## 2023-02-23 NOTE — Progress Notes (Signed)
Patient arrived via stretcher from ED, on 2 liters nasal cannula, purewick in place with brief on but brief saturated with urine and it had soaked through everything under the patient, daughter said that it had not been suctioning properly after being placed in the ED, patient washed up, new linens and purewick placed, legs elevated on a pillow, pitting edema noted to BLE, left greater than right, patient with paper thin skin, multiple bruises all over, multiple skin tears, surgical incision to left hip (3 separate stapled areas) with large bruise ( site cleaned with NSS, xeroform gauze placed over staple sites, and a white mepilex surgical dressing was placed, large skin tear to left knee was also cleansed, covered with xeroform, 4x4 gauze, then wrapped in kerlix, small skin tear to left elbow area- foam mepilex in place, bilateral buttocks each have a small quarter sized open area, skin around is reddened but blanchable, sacral foam was placed, bilateral peripheral AC iv's were flushed, right arm elevated on pillow, bed alarm on, call bell in reach and patient instructed on how to use, will continue to monitor.

## 2023-02-23 NOTE — ED Triage Notes (Signed)
EMS reports from [facility], called out for left leg and right elbow redness and swelling since the 5th post femur Fx.   BP 104/60 HR 90 RR 18 Sp02 100 2ltrs (PRN)  22ga LAC

## 2023-02-23 NOTE — Plan of Care (Signed)
  Problem: Coping: Goal: Level of anxiety will decrease Outcome: Progressing   Problem: Pain Managment: Goal: General experience of comfort will improve and/or be controlled Outcome: Progressing   Problem: Safety: Goal: Ability to remain free from injury will improve Outcome: Progressing   Problem: Skin Integrity: Goal: Risk for impaired skin integrity will decrease Outcome: Progressing

## 2023-02-23 NOTE — ED Provider Notes (Signed)
Assumed care from Dr. Dalene Seltzer at 330 today.  Patient with recent hip replacement who has been at rehab was having worsening swelling in her extremities, also having pain and redness to the right elbow and poor oral intake.  Patient has intermittently been on oxygen since leaving the hospital but denies specific shortness of breath or chest pain today.  I independently interpreted patient's labs labs show unchanged renal function with a creatinine of 1.28, normal electrolytes, elevated LFTs AST of 111 and ALT of 127, BNP mildly elevated at 121, troponin is normal, magnesium is mildly low at 1.6 CBC with new leukocytosis of 18 which is a little higher than what it had been.  Hemoglobin still stable at 9.9.  Patient was waiting on imaging and CT angio is negative for PE today or pleural effusions or edema.  Chest x-ray is within normal limits.  Patient came with a report from the rehab facility that showed a right upper extremity DVT as well as bilateral IJ DVTs.  Patient started on heparin per pharmacy, also given a dose of Lasix due to the diffuse edema and also started on antibiotic given the erythema and warmth of the right upper extremity as well as elevated white count.  Will admit for further care.  CRITICAL CARE Performed by: Amando Ishikawa Total critical care time: 30 minutes Critical care time was exclusive of separately billable procedures and treating other patients. Critical care was necessary to treat or prevent imminent or life-threatening deterioration. Critical care was time spent personally by me on the following activities: development of treatment plan with patient and/or surrogate as well as nursing, discussions with consultants, evaluation of patient's response to treatment, examination of patient, obtaining history from patient or surrogate, ordering and performing treatments and interventions, ordering and review of laboratory studies, ordering and review of radiographic studies,  pulse oximetry and re-evaluation of patient's condition.    Gwyneth Sprout, MD 02/23/23 [phone]

## 2023-02-23 NOTE — ED Provider Notes (Addendum)
 [place] [facility]ERGENCY DEPARTMENT AT [facility] Provider Note   CSN: [id number] Arrival date & time: 02/23/23  1324     History  Chief Complaint  Patient presents with   Leg Swelling   Joint Swelling    Sharon Bailey is a 89 y.o. female PMH post femur fracture, anemia, CKD 3, HTN, gout, history of CVA, venous insufficiency. Recent hospitalization for closed communuted intertrochanteric fracture of left femur after left femur cephalomedullary nailing on 02/06/2023.   HPI She reports intermittent use of 2L oxygen at the rehab facility due to anxiety and dyspnea on exertion. (Was not discharged on oxygen and no hx of O2 requirement prior to hospitalization) Has been participating in rehab, managing about fifteen steps before becoming fatigued and needing to rest or use oxygen.  The pain in the hip is not a significant issue, but the surgical leg is swollen and she suspects fluid accumulation.  Despite being on Lasix, she hasn't been urinating as much.  She also reports extreme fatigue and constantly feeling cold. No fevers  Three days ago, she noticed inflammation, warmth, and swelling over her elbow, which has since developed a bruise.  Ultrasounds were performed on both arms and legs yesterday, but the results were not concerning per daughter.  The patient takes a daily baby aspirin but no other blood thinners.  She recently stopped taking Tramadol due to concerns it was contributing to the fatigue, but the fatigue persists.  Her mouth is extremely dry, feeling like sandpaper, and she has difficulty eating and swallowing due to pain in the tongue. She also reports new blisters on the foot and a sore, on her tongue. No new meds.    Home Medications Prior to Admission medications   Medication Sig Start Date End Date Taking? Authorizing Provider  acetaminophen (TYLENOL) 500 MG tablet Take 2 tablets (1,000 mg total) by mouth every 8 (eight) hours as needed for mild pain (pain  score 1-3). 02/10/23   Rai, Ripudeep K, MD  allopurinol (ZYLOPRIM) 300 MG tablet Take 0.5 tablets (150 mg total) by mouth daily. 02/10/23   Rai, Ripudeep K, MD  amLODipine (NORVASC) 5 MG tablet TAKE 1 TABLET (5 MG TOTAL) BY MOUTH DAILY. 10/22/22   Jodelle Red, MD  aspirin EC 81 MG tablet Take 81 mg by mouth daily. Swallow whole.    [provider]  budesonide (ENTOCORT EC) 3 MG 24 hr capsule TAKE 1 CAPSULE EVERY DAY AS DIRECTED 03/04/22   Hilarie Fredrickson, MD  ferrous sulfate 325 (65 FE) MG EC tablet Take 325 mg by mouth daily with breakfast.    [provider]  levothyroxine (SYNTHROID) 88 MCG tablet Take 88 mcg by mouth daily.    [provider]  polyethylene glycol (MIRALAX / GLYCOLAX) 17 g packet Take 17 g by mouth daily as needed for moderate constipation. 02/10/23   Rai, Delene Ruffini, MD  simvastatin (ZOCOR) 40 MG tablet Take 40 mg by mouth daily.    [provider]  traMADol (ULTRAM) 50 MG tablet Take 1 tablet (50 mg total) by mouth every 8 (eight) hours as needed for severe pain (pain score 7-10). 02/10/23   Rai, Ripudeep K, MD  valsartan (DIOVAN) 160 MG tablet TAKE 1 TABLET EVERY DAY 12/09/22   Jodelle Red, MD  vitamin B-12 1000 MCG tablet Take 1 tablet (1,000 mcg total) by mouth daily. 06/14/19   Joseph Art, DO      Allergies    Bactrim [sulfamethoxazole-trimethoprim] and  Cipro [ciprofloxacin hcl]    Review of Systems   Review of Systems  Constitutional:  Negative for chills and fever.  HENT:  Negative for ear pain and sore throat.   Eyes:  Negative for pain and visual disturbance.  Respiratory:  Positive for shortness of breath. Negative for cough.   Cardiovascular:  Negative for chest pain and palpitations.  Gastrointestinal:  Negative for abdominal pain and vomiting.  Genitourinary:  Negative for dysuria and hematuria.  Musculoskeletal:  Positive for joint swelling. Negative for arthralgias and back pain.  Skin:  Negative for color  change and rash.  Neurological:  Negative for seizures and syncope.  All other systems reviewed and are negative.   Physical Exam Updated Vital Signs BP (!) 117/56   Pulse 91   Temp 97.9 F (36.6 C)   Resp 15   SpO2 100%  Physical Exam Vitals and nursing note reviewed.  Constitutional:      General: She is not in acute distress.    Appearance: She is well-developed.  HENT:     Head: Normocephalic and atraumatic.     Mouth/Throat:     Mouth: Mucous membranes are dry.     Comments: Oral lesion on tip of tongue, dry appearing Eyes:     Conjunctiva/sclera: Conjunctivae normal.  Cardiovascular:     Rate and Rhythm: Normal rate and regular rhythm.     Heart sounds: No murmur heard. Pulmonary:     Effort: Pulmonary effort is normal. No respiratory distress.     Breath sounds: Normal breath sounds.  Abdominal:     Palpations: Abdomen is soft.     Tenderness: There is no abdominal tenderness.  Musculoskeletal:        General: Swelling present. No tenderness.     Cervical back: Neck supple.     Right lower leg: Edema present.     Left lower leg: Edema present.     Comments: Bullae present on left toes, and hematoma vs. Hemmorhagic bullae on left shin - nontender to palpation, mild incisional pus 2+ DP pulses b/l  Right elbow with erythema, warmth, swelling with ?fluid  2+ radial pulse No pain with ROM and good ROM    Skin:    General: Skin is warm and dry.     Capillary Refill: Capillary refill takes less than 2 seconds.  Neurological:     Mental Status: She is alert.  Psychiatric:        Mood and Affect: Mood normal.          ED Results / Procedures / Treatments   Labs (all labs ordered are listed, but only abnormal results are displayed) Labs Reviewed  CBC WITH DIFFERENTIAL/PLATELET - Abnormal; Notable for the following components:      Result Value   WBC 18.4 (*)    RBC 3.42 (*)    Hemoglobin 9.9 (*)    HCT 31.0 (*)    Platelets 415 (*)    Neutro Abs  15.3 (*)    Monocytes Absolute 1.1 (*)    Abs Immature Granulocytes 0.45 (*)    All other components within normal limits  CULTURE, BLOOD (ROUTINE X 2)  CULTURE, BLOOD (ROUTINE X 2)  COMPREHENSIVE METABOLIC PANEL  BRAIN NATRIURETIC PEPTIDE  MAGNESIUM  I-STAT CG4 LACTIC ACID, ED  TROPONIN I (HIGH SENSITIVITY)    EKG None  Radiology VAS [place] LOWER EXTREMITY VENOUS (DVT) (ONLY [facility] & [facility]) Result Date: 02/23/2023  Lower Venous DVT Study Patient Name:  ROSELIN WIEMANN  Koo  Date of Exam:   02/23/2023 Medical Rec #: [id number]       Accession #:    [id number] Date of Birth: March 26, 1930       Patient Gender: F Patient Age:   22 years Exam Location:  [facility] Procedure:      VAS [place] LOWER EXTREMITY VENOUS (DVT) Referring Phys: Alvira Monday --------------------------------------------------------------------------------  Indications: Swelling.  Risk Factors: Trauma. Limitations: Body habitus, poor ultrasound/tissue interface and patient positioning, patient pain tolerance. Comparison Study: No prior studies. Performing Technologist: Chanda Busing RVT  Examination Guidelines: A complete evaluation includes B-mode imaging, spectral Doppler, color Doppler, and power Doppler as needed of all accessible portions of each vessel. Bilateral testing is considered an integral part of a complete examination. Limited examinations for reoccurring indications may be performed as noted. The reflux portion of the exam is performed with the patient in reverse Trendelenburg.  +-----+---------------+---------+-----------+----------+--------------+ RIGHTCompressibilityPhasicitySpontaneityPropertiesThrombus Aging +-----+---------------+---------+-----------+----------+--------------+ CFV  Full           Yes      Yes                                 +-----+---------------+---------+-----------+----------+--------------+   +---------+---------------+---------+-----------+----------+--------------+ LEFT      CompressibilityPhasicitySpontaneityPropertiesThrombus Aging +---------+---------------+---------+-----------+----------+--------------+ CFV      Full           Yes      Yes                                 +---------+---------------+---------+-----------+----------+--------------+ SFJ      Full                                                        +---------+---------------+---------+-----------+----------+--------------+ FV Prox  Full                                                        +---------+---------------+---------+-----------+----------+--------------+ FV Mid                  Yes      Yes                                 +---------+---------------+---------+-----------+----------+--------------+ FV Distal               Yes      Yes                                 +---------+---------------+---------+-----------+----------+--------------+ PFV      Full                                                        +---------+---------------+---------+-----------+----------+--------------+ POP      Full  Yes      Yes                                 +---------+---------------+---------+-----------+----------+--------------+ PTV      Full                                                        +---------+---------------+---------+-----------+----------+--------------+ PERO     Full                                                        +---------+---------------+---------+-----------+----------+--------------+    Summary: RIGHT: - No evidence of common femoral vein obstruction.   LEFT: - There is no evidence of deep vein thrombosis in the lower extremity. However, portions of this examination were limited- see technologist comments above.  - No cystic structure found in the popliteal fossa.  *See table(s) above for measurements and observations.    Preliminary     Procedures Procedures   Medications Ordered in ED Medications - No data  to display  ED Course/ Medical Decision Making/ A&P                                Medical Decision Making Amount and/or Complexity of Data Reviewed Labs: ordered. Radiology: ordered.  Risk Prescription drug management.   Medical Decision Making:   MAXX CALAWAY is a 89 y.o. female who presented to the ED today with Foley, right elbow pain, left leg swelling and tongue lesion, continue hypoxia detailed above.    Additional history discussed with patient's family/caregivers.  External chart has been reviewed including recent hospitalization. Patient's presentation is complicated by their history of recent surgery.  Patient placed on continuous vitals and telemetry monitoring while in ED which was reviewed periodically.  Complete initial physical exam performed, notably the patient  was ,.    Reviewed and confirmed nursing documentation for past medical history, family history, social history.    Initial Assessment:   With the patient's presentation diagnoses were considered including (but not limited to) PE/DVT, PNA, SJS, septic joint, necrotizing fasciitis, gout flare. These are considered less likely due to history of present illness and physical exam findings.    Initial Plan:  Bladder scan Blood culture, lactate Troponin  Screening labs including CBC and Metabolic panel to evaluate for infectious or metabolic etiology of disease.  CXR to evaluate for structural/infectious intrathoracic pathology.  EKG to evaluate for cardiac pathology Objective evaluation as below reviewed   Initial Study Results:   Laboratory  All labs pending  EKG EKG was reviewed independently. Rate, rhythm, axis, intervals all examined and without medically relevant abnormality. ST segments without concerns for elevations.    Radiology:  All images reviewed independently. Agree with radiology report at this time.   VAS [place] LOWER EXTREMITY VENOUS (DVT) (ONLY [facility]) Result Date: 02/23/2023  Lower  Venous DVT Study Patient Name:  KENZLI BARRITT  Date of Exam:   02/23/2023 Medical Rec #: [id number]       Accession #:  [id number] Date of Birth: 02-23-1930       Patient Gender: F Patient Age:   63 years Exam Location:  [facility] Procedure:      VAS [place] LOWER EXTREMITY VENOUS (DVT) Referring Phys: Alvira Monday --------------------------------------------------------------------------------  Indications: Swelling.  Risk Factors: Trauma. Limitations: Body habitus, poor ultrasound/tissue interface and patient positioning, patient pain tolerance. Comparison Study: No prior studies. Performing Technologist: Chanda Busing RVT  Examination Guidelines: A complete evaluation includes B-mode imaging, spectral Doppler, color Doppler, and power Doppler as needed of all accessible portions of each vessel. Bilateral testing is considered an integral part of a complete examination. Limited examinations for reoccurring indications may be performed as noted. The reflux portion of the exam is performed with the patient in reverse Trendelenburg.  +-----+---------------+---------+-----------+----------+--------------+ RIGHTCompressibilityPhasicitySpontaneityPropertiesThrombus Aging +-----+---------------+---------+-----------+----------+--------------+ CFV  Full           Yes      Yes                                 +-----+---------------+---------+-----------+----------+--------------+   +---------+---------------+---------+-----------+----------+--------------+ LEFT     CompressibilityPhasicitySpontaneityPropertiesThrombus Aging +---------+---------------+---------+-----------+----------+--------------+ CFV      Full           Yes      Yes                                 +---------+---------------+---------+-----------+----------+--------------+ SFJ      Full                                                         +---------+---------------+---------+-----------+----------+--------------+ FV Prox  Full                                                        +---------+---------------+---------+-----------+----------+--------------+ FV Mid                  Yes      Yes                                 +---------+---------------+---------+-----------+----------+--------------+ FV Distal               Yes      Yes                                 +---------+---------------+---------+-----------+----------+--------------+ PFV      Full                                                        +---------+---------------+---------+-----------+----------+--------------+ POP      Full           Yes      Yes                                 +---------+---------------+---------+-----------+----------+--------------+  PTV      Full                                                        +---------+---------------+---------+-----------+----------+--------------+ PERO     Full                                                        +---------+---------------+---------+-----------+----------+--------------+    Summary: RIGHT: - No evidence of common femoral vein obstruction.   LEFT: - There is no evidence of deep vein thrombosis in the lower extremity. However, portions of this examination were limited- see technologist comments above.  - No cystic structure found in the popliteal fossa.  *See table(s) above for measurements and observations.    Preliminary    Final Assessment and Plan:   89 year old with interesting constellation of symptoms with elbow swelling, bullae over foot and oral lesions, swelling in surgical leg with no tenderness but mild incisional pus.  No new medications, less likely SJS given no rash over entirety of body.  Nikolsky sign negative. Upper extremity DVT ultrasound from facility was positive for right ulnar vein and right and left internal jugular vein DVT. Most likely  would benefit from CT scan of PE. Lactate 0.9, CBC with WBC 18.4, neutrophilic shift. Anticipate likely admission. Signed out to oncoming provider Dr. Anitra Lauth   Clinical Impression:  1. Left leg swelling      Data Unavailable   Final Clinical Impression(s) / ED Diagnoses Final diagnoses:  Left leg swelling    Rx / DC Orders ED Discharge Orders     None         Levin Erp, MD 02/23/23 1610    Levin Erp, MD 02/23/23 1651    Alvira Monday, MD 02/28/23 [phone]

## 2023-02-23 NOTE — Progress Notes (Signed)
Left lower extremity venous duplex has been completed. Preliminary results can be found in CV Proc through chart review.  Results were given to Dr. Dalene Seltzer.  02/23/23 2:43 PM Olen Cordial RVT

## 2023-02-23 NOTE — H&P (Signed)
History and Physical    Sharon Bailey NWG:[id number] DOB: November 26, 1930 DOA: 02/23/2023  PCP: Irven Coe, MD   Patient coming from: Home   Chief Complaint:  Chief Complaint  Patient presents with   Leg Swelling   Joint Swelling    HPI:  Sharon Bailey is a 89 y.o. female with hx of recent admission 1/31 - 2/5 for fall complicated by left hip fracture s/p ORIF and IMN, ABLA requiring blood transfusion, additional history including hypertension, hyperlipidemia, CKD 3B, hypothyroidism, gout, chronic low back pain, OSA, lymphocytic colitis, who presented due to worsening lower extremity edema.  Also reporting ultrasound demonstrating a right upper extremity DVT and bilateral IJ DVT.   Reports that extremity edema has been present postoperatively his bilateral.  Is very thin skin and has fluid buildup and clear vesicles that have popped up distally on the left foot.  No orthopnea, chest pain, dyspnea.  Otherwise since Friday, over the past 4 days has been dealing with swelling and pain in the right elbow which is erythematous as well.  Reports that she had a venous duplex today at [facility] demonstrating right upper extremity DVT and bilateral IJ DVT however this result is not available in the system.   In last complaining of burning tongue and taking in less p.o.'s, feels like she is getting dehydrated despite the lower extremity edema.   Review of Systems:  ROS complete and negative except as marked above   Allergies  Allergen Reactions   Bactrim [Sulfamethoxazole-Trimethoprim] Diarrhea and Nausea Only   Cipro [Ciprofloxacin Hcl] Hives    Prior to Admission medications   Medication Sig Start Date End Date Taking? Authorizing Provider  acetaminophen (TYLENOL) 325 MG tablet Take 650 mg by mouth 2 (two) times daily as needed (for pain).   Yes [provider]  acetaminophen (TYLENOL) 500 MG tablet Take 2 tablets (1,000 mg total) by mouth every 8 (eight) hours as needed for  mild pain (pain score 1-3). Patient taking differently: Take 1,000 mg by mouth 3 (three) times daily. 02/10/23  Yes Rai, Ripudeep K, MD  allopurinol (ZYLOPRIM) 300 MG tablet Take 0.5 tablets (150 mg total) by mouth daily. 02/10/23  Yes Rai, Ripudeep K, MD  amLODipine (NORVASC) 5 MG tablet TAKE 1 TABLET (5 MG TOTAL) BY MOUTH DAILY. 10/22/22  Yes Jodelle Red, MD  aspirin EC 81 MG tablet Take 81 mg by mouth daily. Swallow whole.   Yes [provider]  budesonide (ENTOCORT EC) 3 MG 24 hr capsule TAKE 1 CAPSULE EVERY DAY AS DIRECTED Patient taking differently: Take 3 mg by mouth daily. 03/04/22  Yes Hilarie Fredrickson, MD  ferrous sulfate 325 (65 FE) MG EC tablet Take 325 mg by mouth daily with breakfast.   Yes [provider]  furosemide (LASIX) 20 MG tablet Take 10 mg by mouth daily. 02/19/23 02/24/23 Yes [provider]  levothyroxine (SYNTHROID) 88 MCG tablet Take 88 mcg by mouth in the morning.   Yes [provider]  OXYGEN Inhale 2 L/min into the lungs as needed (for shortness of breath or chest pain).   Yes [provider]  polyethylene glycol (MIRALAX / GLYCOLAX) 17 g packet Take 17 g by mouth daily as needed for moderate constipation. Patient taking differently: Take 17 g by mouth daily as needed (for constipation). 02/10/23  Yes Rai, Ripudeep K, MD  simvastatin (ZOCOR) 40 MG tablet Take 40 mg by mouth every evening.   Yes [provider]  traMADol Janean Sark)  50 MG tablet Take 1 tablet (50 mg total) by mouth every 8 (eight) hours as needed for severe pain (pain score 7-10). 02/10/23  Yes Rai, Ripudeep K, MD  vitamin B-12 1000 MCG tablet Take 1 tablet (1,000 mcg total) by mouth daily. 06/14/19  Yes Vann, Selinda Orion, DO  ZYRTEC ALLERGY 10 MG tablet Take 10 mg by mouth at bedtime. 02/17/23 02/24/23 Yes [provider]  valsartan (DIOVAN) 160 MG tablet TAKE 1 TABLET EVERY DAY 12/09/22   Jodelle Red, MD    Past Medical History:   Diagnosis Date   Abnormal EKG    Acute CVA (cerebrovascular accident) (HCC) 06/11/2019   AKI (acute kidney injury) (HCC) 06/11/2019   Collagenous colitis    Collagenous colitis    Diverticulitis    Diverticulosis    Fluttering heart    GERD (gastroesophageal reflux disease)    Gout    Gout attack 09/28/2012   Hammer toe of left foot 09/28/2012   Hyperlipidemia    Hypertension    Hypertensive emergency    Hypothyroidism    Irregular heart beat    Pain in joint, ankle and foot 09/28/2012   Status post foot surgery 10/11/2012   Thyroid disease     Past Surgical History:  Procedure Laterality Date   ABDOMINAL HYSTERECTOMY     ADENOIDECTOMY     CHOLECYSTECTOMY     Excision Ganglion Toe Left 09/29/2012   Lt #2 @ [facility]   Hammer toe repair Left 09/29/2012   Lt #2 @ [facility]   INTRAMEDULLARY (IM) NAIL INTERTROCHANTERIC Left 02/06/2023   Procedure: INTRAMEDULLARY (IM) NAIL INTERTROCHANTERIC;  Surgeon: Luci Bank, MD;  Location: [facility] OR;  Service: Orthopedics;  Laterality: Left;   JOINT REPLACEMENT Left    Knee   TONSILLECTOMY       reports that she has never smoked. She has never used smokeless tobacco. She reports that she does not drink alcohol and does not use drugs.  Family History  Problem Relation Age of Onset   Breast cancer Mother    Memory loss Father    Heart disease Sister    Rheumatic fever Sister    Lung disease Brother    Colon cancer Neg Hx      Physical Exam: Vitals:   02/23/23 1745 02/23/23 1800 02/23/23 1934 02/23/23 2000  BP:  118/64  134/60  Pulse: 81 85  94  Resp: 16 18  19   Temp:    (!) 97.3 F (36.3 C)  TempSrc:    Oral  SpO2: 100% 100%  100%  Weight:   64 kg     Gen: Awake, alert, chronically ill-appearing, elderly, frail HEENT: Tongue is atrophic CV: Regular, normal S1, S2, 3/6 crescendo decrescendo SEM Resp: Normal WOB, on 2 L, CTAB  Abd: Flat, normoactive, nontender MSK: Right upper extremity appears to have swelling on the medial  aspect adjacent to the bursa which is erythematous, slightly warm. There is 1+ anasarca, worst in dependent areas.  Lower extremities have 2+ pitting edema with very thin and fragile skin and almost bullae of serous fluid collections visible underlying, hematoma at the L distal medial ankle; edema is symmetric.  Left hip dressing is clean and dry Skin: See MSK for more findings Neuro: Alert and interactive  Psych: euthymic, appropriate    Data review:   Labs reviewed, notable for:   Lactate 0.9 Bicarb 20, no anion gap BUN 65, creatinine 1.2 stable Mag 1.6 Albumin 2.3 AST 111, ALT 127, other LFT  normal BNP 121 High-sensitivity troponin 10 -> 8 WBC 18, night neutrophilic Hemoglobin 9.9, down from 11, platelet up to 415  Micro:  Results for orders placed or performed during the hospital encounter of 02/05/23  Surgical pcr screen     Status: None   Collection Time: 02/06/23  4:38 AM   Specimen: Nasal Mucosa; Nasal Swab  Result Value Ref Range Status   MRSA, PCR NEGATIVE NEGATIVE Final   Staphylococcus aureus NEGATIVE NEGATIVE Final    Comment: (NOTE) The Xpert SA Assay (FDA approved for NASAL specimens in patients 38 years of age and older), is one component of a comprehensive surveillance program. It is not intended to diagnose infection nor to guide or monitor treatment. Performed at [facility], [address], [place], [place] [address]     Imaging reviewed:  CT Angio Chest PE W and/or Wo Contrast Result Date: 02/23/2023 CLINICAL DATA:  Elbow and leg redness and swelling history of femur fracture hypoxia EXAM: CT ANGIOGRAPHY CHEST WITH CONTRAST TECHNIQUE: Multidetector CT imaging of the chest was performed using the standard protocol during bolus administration of intravenous contrast. Multiplanar CT image reconstructions and MIPs were obtained to evaluate the vascular anatomy. RADIATION DOSE REDUCTION: This exam was performed according to the departmental  dose-optimization program which includes automated exposure control, adjustment of the mA and/or kV according to patient size and/or use of iterative reconstruction technique. CONTRAST:  60mL OMNIPAQUE IOHEXOL 350 MG/ML SOLN COMPARISON:  Chest x-ray 02/23/2023 FINDINGS: Cardiovascular: Satisfactory opacification of the pulmonary arteries to the segmental level. No evidence of pulmonary embolism. Nonaneurysmal aorta. No dissection. Direct origin of left vertebral artery from the aortic arch. Moderate atherosclerosis. Coronary vascular calcification. Normal cardiac size. No pericardial effusion Mediastinum/Nodes: Patent trachea. No suspicious thyroid mass. No suspicious lymph nodes. Mild fluid distension of the esophagus. Circumferential wall thickening of the mid to distal esophagus with mild diffuse mucosal enhancement. Small moderate hiatal hernia with thickening at the GE junction. Lungs/Pleura: No acute airspace disease, pleural effusion, or pneumothorax. Upper Abdomen: Cholecystectomy. Prominent common bile duct likely due to surgical change. No acute finding Musculoskeletal: Degenerative changes. No acute osseous abnormality. Review of the MIP images confirms the above findings. IMPRESSION: 1. Negative for acute pulmonary embolus or aortic dissection. 2. No CT evidence for acute pulmonary abnormality. 3. Circumferential wall thickening of the mid to distal esophagus with mild diffuse mucosal enhancement, findings suggestive of esophagitis. Small to moderate hiatal hernia with thickening at the GE junction, cannot exclude mass and consider correlation with endoscopy 4. Aortic atherosclerosis. Aortic Atherosclerosis (ICD10-I70.0). Electronically Signed   By: Jasmine Pang M.D.   On: 02/23/2023 18:25   DG CHEST PORT 1 VIEW Result Date: 02/23/2023 CLINICAL DATA:  Hypoxia. EXAM: PORTABLE CHEST 1 VIEW COMPARISON:  Chest radiograph dated 02/05/2023. FINDINGS: The heart size and mediastinal contours are within  normal limits. Aortic atherosclerosis. No focal consolidation, pleural effusion, or pneumothorax. No acute osseous abnormality. IMPRESSION: No acute cardiopulmonary findings. Electronically Signed   By: Hart Robinsons M.D.   On: 02/23/2023 17:23   VAS [place] LOWER EXTREMITY VENOUS (DVT) (ONLY [facility] & [facility]) Result Date: 02/23/2023  Lower Venous DVT Study Patient Name:  Sharon Bailey  Date of Exam:   02/23/2023 Medical Rec #: [id number]       Accession #:    [id number] Date of Birth: 08-06-1930       Patient Gender: F Patient Age:   53 years Exam [facility]  [facility] Procedure:  VAS [place] LOWER EXTREMITY VENOUS (DVT) Referring Phys: Alvira Monday --------------------------------------------------------------------------------  Indications: Swelling.  Risk Factors: Trauma. Limitations: Body habitus, poor ultrasound/tissue interface and patient positioning, patient pain tolerance. Comparison Study: No prior studies. Performing Technologist: Chanda Busing RVT  Examination Guidelines: A complete evaluation includes B-mode imaging, spectral Doppler, color Doppler, and power Doppler as needed of all accessible portions of each vessel. Bilateral testing is considered an integral part of a complete examination. Limited examinations for reoccurring indications may be performed as noted. The reflux portion of the exam is performed with the patient in reverse Trendelenburg.  +-----+---------------+---------+-----------+----------+--------------+ RIGHTCompressibilityPhasicitySpontaneityPropertiesThrombus Aging +-----+---------------+---------+-----------+----------+--------------+ CFV  Full           Yes      Yes                                 +-----+---------------+---------+-----------+----------+--------------+   +---------+---------------+---------+-----------+----------+--------------+ LEFT     CompressibilityPhasicitySpontaneityPropertiesThrombus Aging  +---------+---------------+---------+-----------+----------+--------------+ CFV      Full           Yes      Yes                                 +---------+---------------+---------+-----------+----------+--------------+ SFJ      Full                                                        +---------+---------------+---------+-----------+----------+--------------+ FV Prox  Full                                                        +---------+---------------+---------+-----------+----------+--------------+ FV Mid                  Yes      Yes                                 +---------+---------------+---------+-----------+----------+--------------+ FV Distal               Yes      Yes                                 +---------+---------------+---------+-----------+----------+--------------+ PFV      Full                                                        +---------+---------------+---------+-----------+----------+--------------+ POP      Full           Yes      Yes                                 +---------+---------------+---------+-----------+----------+--------------+ PTV      Full                                                        +---------+---------------+---------+-----------+----------+--------------+  PERO     Full                                                        +---------+---------------+---------+-----------+----------+--------------+     Summary: RIGHT: - No evidence of common femoral vein obstruction.   LEFT: - There is no evidence of deep vein thrombosis in the lower extremity. However, portions of this examination were limited- see technologist comments above.  - No cystic structure found in the popliteal fossa.  *See table(s) above for measurements and observations. Electronically signed by Lemar Livings MD on 02/23/2023 at 4:34:58 PM.    Final     ED Course:  Treated with ceftriaxone 2 g IV, Lasix 20 mg IV x 1.  Ordered  for heparin drip but I have canceled.   Assessment/Plan:  89 y.o. female with hx hx of recent admission 1/31 - 2/5 for fall complicated by left hip fracture s/p ORIF and IMN, ABLA requiring blood transfusion, additional history including hypertension, hyperlipidemia, CKD 3B, hypothyroidism, gout, chronic low back pain, OSA, lymphocytic colitis, who presented due to worsening lower extremity edema. Also with report of DVT RUE and bilateral internal jugular, suspect cellulitis or area of thrombophlebitis in the right medial elbow.  Anasarca  Bilateral LE edema  Overall with picture more consistent with anasarca and suspect this is related to the low oncotic pressure from hypoalbuminemia poor nutritional state.  Venous duplex negative for DVT and imaged areas.  She does have previous mild aortic stenosis and is murmur on exam.  BNP is minimally elevated at 121.  Overall she has poor oral intake and appears slightly volume down despite her lower extremity edema.  Do not feel that pursuing additional evaluation of her aortic stenosis and heart failure would change management at this point as diuresis would likely cause more volume depletion. - S/p Lasix 20 mg IV x 1.  Hold on additional doses.  Would not schedule daily diuretic at this time.  - Elevate lower extremities above the level of the heart, gentle Ace wrapping from the ankle up to the knees - Should have a routine echo outpatient - Check TSH, UA, UPCR  Reported DVT right upper extremity and bilateral IJ DVT Family reports imaging done it was a long although only report in system is from lower extremity duplex which is negative, attached above.  Will hold on therapeutic anticoagulation for now while awaiting repeat study - Venous duplex bilateral upper extremity -Hold on therapeutic anticoagulation for now  Question cellulitis versus thrombophlebitis in the right medial elbow - S/p ceftriaxone, switch to Keflex 500 mg every 6 hours  p.o.  Poor oral intake Suspect moderate protein calorie malnutrition - Nutrition consult. 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  This is likely also represents her poor nutritional state. - Chloraseptic Spray prn, oral care and swabs  Left hip fracture s/p ORIF and IMN - Due for a postop check with Dr. Sherrie Sport tomorrow, but family canceled appointment while in the ED.  If his group is on-call tomorrow may be able to have postop check then.  Please contact if on-call  Chronic medical problems: Hypertension: Hold her home amlodipine his BP low normal.  Previously had stopped valsartan continue to hold.  Hyperlipidemia: Continue home simvastatin CKD 3B:  Creatinine at baseline approx 1.3 Hypothyroidism: Continue home levothyroxine.  Check TSH Gout: Continue home allopurinol Chronic low back pain: Not on chronic pain meds OSA: Not on CPAP Lymphocytic colitis: Continue budesonide  Body mass index is 24.19 kg/m.    DVT prophylaxis:  Lovenox Code Status:  Full Code Diet:  Diet Orders (From admission, onward)     Start     Ordered   02/23/23 2031  Diet regular Room service appropriate? Yes; Fluid consistency: Thin  Diet effective now       Question Answer Comment  Room service appropriate? Yes   Fluid consistency: Thin      02/23/23 2030           Family Communication:  Yes discussed with daughter at bedside   Consults:  No   Admission status:   Observation, Med-Surg  Severity of Illness: The appropriate patient status for this patient is OBSERVATION. Observation status is judged to be reasonable and necessary in order to provide the required intensity of service to ensure the patient's safety. The patient's presenting symptoms, physical exam findings, and initial radiographic and laboratory data in the context of their medical condition is felt to place them at decreased risk for further clinical deterioration.  Furthermore, it is anticipated that the patient will be medically stable for discharge from the hospital within 2 midnights of admission.    Dolly Rias, MD [facility]  How to contact the [facility] Attending or Consulting provider 7A - 7P or covering provider during after hours 7P -7A, for this patient.  Check the care team in [facility] and look for a) attending/consulting TRH provider listed and b) the [facility] team listed Log into [email or web address] and use [place]'s universal password to access. If you do not have the password, please contact the hospital operator. Locate the [facility] provider you are looking for under [facility] and page to a number that you can be directly reached. If you still have difficulty reaching the provider, please page the [facility] (Director on Call) for the Hospitalists listed on amion for assistance.  02/23/2023, 8:41 PM

## 2023-02-24 ENCOUNTER — Observation Stay (HOSPITAL_BASED_OUTPATIENT_CLINIC_OR_DEPARTMENT_OTHER): Payer: Medicare PPO

## 2023-02-24 DIAGNOSIS — R531 Weakness: Secondary | ICD-10-CM | POA: Diagnosis not present

## 2023-02-24 DIAGNOSIS — I82601 Acute embolism and thrombosis of unspecified veins of right upper extremity: Secondary | ICD-10-CM

## 2023-02-24 DIAGNOSIS — R601 Generalized edema: Secondary | ICD-10-CM | POA: Diagnosis not present

## 2023-02-24 DIAGNOSIS — Z7401 Bed confinement status: Secondary | ICD-10-CM | POA: Diagnosis not present

## 2023-02-24 DIAGNOSIS — L03113 Cellulitis of right upper limb: Secondary | ICD-10-CM

## 2023-02-24 LAB — CBC
HCT: 31.8 % — ABNORMAL LOW (ref 36.0–46.0)
Hemoglobin: 9.9 g/dL — ABNORMAL LOW (ref 12.0–15.0)
MCH: 28.9 pg (ref 26.0–34.0)
MCHC: 31.1 g/dL (ref 30.0–36.0)
MCV: 92.7 fL (ref 80.0–100.0)
Platelets: 370 10*3/uL (ref 150–400)
RBC: 3.43 MIL/uL — ABNORMAL LOW (ref 3.87–5.11)
RDW: 15.3 % (ref 11.5–15.5)
WBC: 15.3 10*3/uL — ABNORMAL HIGH (ref 4.0–10.5)
nRBC: 0.1 % (ref 0.0–0.2)

## 2023-02-24 LAB — BASIC METABOLIC PANEL
Anion gap: 10 (ref 5–15)
BUN: 60 mg/dL — ABNORMAL HIGH (ref 8–23)
CO2: 17 mmol/L — ABNORMAL LOW (ref 22–32)
Calcium: 8.5 mg/dL — ABNORMAL LOW (ref 8.9–10.3)
Chloride: 112 mmol/L — ABNORMAL HIGH (ref 98–111)
Creatinine, Ser: 1.19 mg/dL — ABNORMAL HIGH (ref 0.44–1.00)
GFR, Estimated: 43 mL/min — ABNORMAL LOW (ref >60)
Glucose, Bld: 89 mg/dL (ref 70–99)
Potassium: 4 mmol/L (ref 3.5–5.1)
Sodium: 139 mmol/L (ref 135–145)

## 2023-02-24 LAB — URINALYSIS, ROUTINE W REFLEX MICROSCOPIC
Bilirubin Urine: NEGATIVE
Glucose, UA: NEGATIVE mg/dL
Hgb urine dipstick: NEGATIVE
Ketones, ur: NEGATIVE mg/dL
Leukocytes,Ua: NEGATIVE
Nitrite: NEGATIVE
Protein, ur: NEGATIVE mg/dL
Specific Gravity, Urine: 1.012 (ref 1.005–1.030)
pH: 5 (ref 5.0–8.0)

## 2023-02-24 LAB — PROTEIN / CREATININE RATIO, URINE
Creatinine, Urine: 18 mg/dL
Total Protein, Urine: <6 mg/dL

## 2023-02-24 LAB — TSH: TSH: 4.988 u[IU]/mL — ABNORMAL HIGH (ref 0.350–4.500)

## 2023-02-24 LAB — MAGNESIUM: Magnesium: 2.2 mg/dL (ref 1.7–2.4)

## 2023-02-24 LAB — PHOSPHORUS: Phosphorus: 3.6 mg/dL (ref 2.5–4.6)

## 2023-02-24 MED ORDER — CEPHALEXIN 500 MG PO CAPS: 500.0000 mg | ORAL_CAPSULE | Freq: Three times a day (TID) | ORAL | 0 refills | Status: AC

## 2023-02-24 NOTE — Progress Notes (Addendum)
Report called to [place] at [facility] [phone]. Results [place] and Discharge summary reviewed , skin assessment findings discussed. Questions, concerns were denied at this time. Discharge instructions reviewed with the patients daughter also while at bedside.

## 2023-02-24 NOTE — TOC Transition Note (Signed)
Transition of Care [facility]) - Discharge Note   Patient Details  Name: Sharon Bailey MRN: [id number] Date of Birth: February 17, 1930  [facility]) CM/SW Contact:  Amada Jupiter, LCSW Phone Number: 02/24/2023, 1:25 PM   Clinical Narrative:     Pt medically cleared for return to [facility] today.  Pt and daughter, Sharon Bailey, aware and agreeable.  Have confirmed with facility [facility] with admissions) that pt clear to return from their standpoint.  PTAR called at 1:15pm.  RN to call report to [phone].  No further TOC needs.  Final next level of care: Skilled Nursing Facility Barriers to Discharge: Barriers Resolved   Patient Goals and CMS Choice Patient states their goals for this hospitalization and ongoing recovery are:: return to rehab          Discharge Placement              Patient chooses bed at: [facility] Patient to be transferred to facility by: PTAR Name of family member notified: daughter, Sharon Bailey Patient and family notified of of transfer: 02/24/23  Discharge Plan and Services Additional resources added to the After Visit Summary for                  DME Arranged: N/A DME Agency: NA                  Social Drivers of Health (SDOH) Interventions SDOH Screenings   Food Insecurity: No Food Insecurity (02/23/2023)  Housing: Low Risk  (02/23/2023)  Transportation Needs: No Transportation Needs (02/23/2023)  Utilities: Not At Risk (02/23/2023)  Depression (PHQ2-9): Low Risk  (02/21/2022)  Social Connections: Moderately Integrated (02/05/2023)  Tobacco Use: Low Risk  (02/23/2023)     Readmission Risk Interventions     No data to display

## 2023-02-24 NOTE — Care Management Obs Status (Signed)
MEDICARE OBSERVATION STATUS NOTIFICATION   Patient Details  Name: Sharon Bailey MRN: [id number] Date of Birth: [birth year]   Medicare Observation Status Notification Given:  Yes    Amada Jupiter, LCSW 02/24/2023, 1:24 PM

## 2023-02-24 NOTE — Progress Notes (Signed)
Bilateral upper extremity venous duplex has been completed. Preliminary results can be found in CV Proc through chart review.   02/24/23 9:19 AM Olen Cordial RVT

## 2023-02-24 NOTE — Discharge Summary (Signed)
Physician Discharge Summary  Sharon Bailey ZOX:[id number] DOB: March 28, 1930 DOA: 02/23/2023  PCP: Irven Coe, MD  Admit date: 02/23/2023 Discharge date: 02/24/2023  Admitted From: SNF Disposition: SNF  Recommendations for Outpatient Follow-up:  Follow up with PCP in 1-2 weeks  Discharge Condition: Stable CODE STATUS: DNR Diet recommendation: As tolerated  Brief/Interim Summary: 89 y.o. female with hx hx of recent admission 1/31 - 2/5 for fall complicated by left hip fracture s/p ORIF and IMN, ABLA requiring blood transfusion, additional history including hypertension, hyperlipidemia, CKD 3B, hypothyroidism, gout, chronic low back pain, OSA, lymphocytic colitis, who presented due to worsening lower extremity edema. Also with report of DVT RUE and bilateral internal jugular, suspect cellulitis or area of thrombophlebitis in the right medial elbow.   Patient admitted as above per family due to need for acute workup in the setting of presumed DVT.  Fortunately imaging here including CTA chest, lower extremity DVT and bilateral upper extremity DVT studies are all unremarkable for any filling defects.  There is concern over erythema and swelling over her right elbow consistent with cellulitis.  Patient will be discharged on remainder of her cephalexin but no need for anticoagulation given negative imaging here.  Patient otherwise stable and agreeable for discharge back to facility, family at bedside and agreeable.  Patient had notable anasarca/edema in the setting of protein caloric malnutrition.  Patient improved drastically with supportive care, discussed appropriate nutrition with family at bedside.  Labs otherwise generally unremarkable, minimally elevated TSH but baseline anemia appears to be stable at baseline, white count downtrending appropriately.  Discharge Diagnoses:  Principal Problem:   Acute deep vein thrombosis (DVT) of right upper extremity (HCC) Active Problems:   Anasarca    Cellulitis   Protein calorie malnutrition (HCC)   Discharge Instructions   Allergies as of 02/24/2023       Reactions   Bactrim [sulfamethoxazole-trimethoprim] Diarrhea, Nausea Only   Cipro [ciprofloxacin Hcl] Hives        Medication List     STOP taking these medications    valsartan 160 MG tablet Commonly known as: DIOVAN       TAKE these medications    acetaminophen 325 MG tablet Commonly known as: TYLENOL Take 650 mg by mouth 2 (two) times daily as needed (for pain). What changed: Another medication with the same name was changed. Make sure you understand how and when to take each.   acetaminophen 500 MG tablet Commonly known as: TYLENOL Take 2 tablets (1,000 mg total) by mouth every 8 (eight) hours as needed for mild pain (pain score 1-3). What changed: when to take this   allopurinol 300 MG tablet Commonly known as: ZYLOPRIM Take 0.5 tablets (150 mg total) by mouth daily.   amLODipine 5 MG tablet Commonly known as: NORVASC TAKE 1 TABLET (5 MG TOTAL) BY MOUTH DAILY.   aspirin EC 81 MG tablet Take 81 mg by mouth daily. Swallow whole.   budesonide 3 MG 24 hr capsule Commonly known as: ENTOCORT EC TAKE 1 CAPSULE EVERY DAY AS DIRECTED What changed:  how much to take how to take this when to take this additional instructions   cephALEXin 500 MG capsule Commonly known as: KEFLEX Take 1 capsule (500 mg total) by mouth every 8 (eight) hours for 5 days.   cyanocobalamin 1000 MCG tablet Take 1 tablet (1,000 mcg total) by mouth daily.   ferrous sulfate 325 (65 FE) MG EC tablet Take 325 mg by mouth daily with breakfast.   furosemide  20 MG tablet Commonly known as: LASIX Take 10 mg by mouth daily.   levothyroxine 88 MCG tablet Commonly known as: SYNTHROID Take 88 mcg by mouth in the morning.   OXYGEN Inhale 2 L/min into the lungs as needed (for shortness of breath or chest pain).   polyethylene glycol 17 g packet Commonly known as: MIRALAX /  GLYCOLAX Take 17 g by mouth daily as needed for moderate constipation. What changed: reasons to take this   simvastatin 40 MG tablet Commonly known as: ZOCOR Take 40 mg by mouth every evening.   traMADol 50 MG tablet Commonly known as: ULTRAM Take 1 tablet (50 mg total) by mouth every 8 (eight) hours as needed for severe pain (pain score 7-10).   ZyrTEC Allergy 10 MG tablet Generic drug: cetirizine Take 10 mg by mouth at bedtime.        Allergies  Allergen Reactions   Bactrim [Sulfamethoxazole-Trimethoprim] Diarrhea and Nausea Only   Cipro [Ciprofloxacin Hcl] Hives    Consultations: None   Procedures/Studies: VAS [place] UPPER EXTREMITY VENOUS DUPLEX Result Date: 02/24/2023 UPPER VENOUS STUDY  Patient Name:  Sharon Bailey  Date of Exam:   02/24/2023 Medical Rec #: [id number]       Accession #:    [id number] Date of Birth: Apr 09, 1930       Patient Gender: F Patient Age:   37 years Exam Location:  [facility] Procedure:      VAS [place] UPPER EXTREMITY VENOUS DUPLEX Referring Phys: Dolly Rias --------------------------------------------------------------------------------  Indications: Report of R ulnar DVT and bilateral IJ DVT. Please assess for chronicity as able Risk Factors: None identified. Comparison Study: No prior studies. Performing Technologist: Chanda Busing RVT  Examination Guidelines: A complete evaluation includes B-mode imaging, spectral Doppler, color Doppler, and power Doppler as needed of all accessible portions of each vessel. Bilateral testing is considered an integral part of a complete examination. Limited examinations for reoccurring indications may be performed as noted.  Right Findings: +----------+------------+---------+-----------+----------+-------+ RIGHT     CompressiblePhasicitySpontaneousPropertiesSummary +----------+------------+---------+-----------+----------+-------+ IJV           Full       Yes       Yes                       +----------+------------+---------+-----------+----------+-------+ Subclavian    Full       Yes       Yes                      +----------+------------+---------+-----------+----------+-------+ Axillary      Full       Yes       Yes                      +----------+------------+---------+-----------+----------+-------+ Brachial      Full                                          +----------+------------+---------+-----------+----------+-------+ Radial        Full                                          +----------+------------+---------+-----------+----------+-------+ Ulnar         Full                                          +----------+------------+---------+-----------+----------+-------+  Cephalic      Full                                          +----------+------------+---------+-----------+----------+-------+ Basilic       Full                                          +----------+------------+---------+-----------+----------+-------+  Left Findings: +----------+------------+---------+-----------+----------+-------+ LEFT      CompressiblePhasicitySpontaneousPropertiesSummary +----------+------------+---------+-----------+----------+-------+ IJV           Full       Yes       Yes                      +----------+------------+---------+-----------+----------+-------+ Subclavian    Full       Yes       Yes                      +----------+------------+---------+-----------+----------+-------+ Axillary      Full       Yes       Yes                      +----------+------------+---------+-----------+----------+-------+ Brachial      Full                                          +----------+------------+---------+-----------+----------+-------+ Radial        Full                                          +----------+------------+---------+-----------+----------+-------+ Ulnar         Full                                           +----------+------------+---------+-----------+----------+-------+ Cephalic      Full                                          +----------+------------+---------+-----------+----------+-------+ Basilic       Full                                          +----------+------------+---------+-----------+----------+-------+  Summary:  Right: No evidence of deep vein thrombosis in the upper extremity. No evidence of superficial vein thrombosis in the upper extremity.  Left: No evidence of deep vein thrombosis in the upper extremity. No evidence of superficial vein thrombosis in the upper extremity.  *See table(s) above for measurements and observations.     Preliminary    CT Angio Chest PE W and/or Wo Contrast Result Date: 02/23/2023 CLINICAL DATA:  Elbow and leg redness and swelling history of femur fracture hypoxia EXAM: CT ANGIOGRAPHY CHEST WITH CONTRAST TECHNIQUE: Multidetector CT imaging of the chest was performed using the standard  protocol during bolus administration of intravenous contrast. Multiplanar CT image reconstructions and MIPs were obtained to evaluate the vascular anatomy. RADIATION DOSE REDUCTION: This exam was performed according to the departmental dose-optimization program which includes automated exposure control, adjustment of the mA and/or kV according to patient size and/or use of iterative reconstruction technique. CONTRAST:  60mL OMNIPAQUE IOHEXOL 350 MG/ML SOLN COMPARISON:  Chest x-ray 02/23/2023 FINDINGS: Cardiovascular: Satisfactory opacification of the pulmonary arteries to the segmental level. No evidence of pulmonary embolism. Nonaneurysmal aorta. No dissection. Direct origin of left vertebral artery from the aortic arch. Moderate atherosclerosis. Coronary vascular calcification. Normal cardiac size. No pericardial effusion Mediastinum/Nodes: Patent trachea. No suspicious thyroid mass. No suspicious lymph nodes. Mild fluid distension of the esophagus.  Circumferential wall thickening of the mid to distal esophagus with mild diffuse mucosal enhancement. Small moderate hiatal hernia with thickening at the GE junction. Lungs/Pleura: No acute airspace disease, pleural effusion, or pneumothorax. Upper Abdomen: Cholecystectomy. Prominent common bile duct likely due to surgical change. No acute finding Musculoskeletal: Degenerative changes. No acute osseous abnormality. Review of the MIP images confirms the above findings. IMPRESSION: 1. Negative for acute pulmonary embolus or aortic dissection. 2. No CT evidence for acute pulmonary abnormality. 3. Circumferential wall thickening of the mid to distal esophagus with mild diffuse mucosal enhancement, findings suggestive of esophagitis. Small to moderate hiatal hernia with thickening at the GE junction, cannot exclude mass and consider correlation with endoscopy 4. Aortic atherosclerosis. Aortic Atherosclerosis (ICD10-I70.0). Electronically Signed   By: Jasmine Pang M.D.   On: 02/23/2023 18:25   DG CHEST PORT 1 VIEW Result Date: 02/23/2023 CLINICAL DATA:  Hypoxia. EXAM: PORTABLE CHEST 1 VIEW COMPARISON:  Chest radiograph dated 02/05/2023. FINDINGS: The heart size and mediastinal contours are within normal limits. Aortic atherosclerosis. No focal consolidation, pleural effusion, or pneumothorax. No acute osseous abnormality. IMPRESSION: No acute cardiopulmonary findings. Electronically Signed   By: Hart Robinsons M.D.   On: 02/23/2023 17:23   VAS [place] LOWER EXTREMITY VENOUS (DVT) (ONLY [facility] & [facility]) Result Date: 02/23/2023  Lower Venous DVT Study Patient Name:  ANAYS DETORE  Date of Exam:   02/23/2023 Medical Rec #: [id number]       Accession #:    [id number] Date of Birth: October 11, 1930       Patient Gender: F Patient Age:   6 years Exam Location:  [facility] Procedure:      VAS [place] LOWER EXTREMITY VENOUS (DVT) Referring Phys: Alvira Monday  --------------------------------------------------------------------------------  Indications: Swelling.  Risk Factors: Trauma. Limitations: Body habitus, poor ultrasound/tissue interface and patient positioning, patient pain tolerance. Comparison Study: No prior studies. Performing Technologist: Chanda Busing RVT  Examination Guidelines: A complete evaluation includes B-mode imaging, spectral Doppler, color Doppler, and power Doppler as needed of all accessible portions of each vessel. Bilateral testing is considered an integral part of a complete examination. Limited examinations for reoccurring indications may be performed as noted. The reflux portion of the exam is performed with the patient in reverse Trendelenburg.  +-----+---------------+---------+-----------+----------+--------------+ RIGHTCompressibilityPhasicitySpontaneityPropertiesThrombus Aging +-----+---------------+---------+-----------+----------+--------------+ CFV  Full           Yes      Yes                                 +-----+---------------+---------+-----------+----------+--------------+   +---------+---------------+---------+-----------+----------+--------------+ LEFT     CompressibilityPhasicitySpontaneityPropertiesThrombus Aging +---------+---------------+---------+-----------+----------+--------------+ CFV      Full  Yes      Yes                                 +---------+---------------+---------+-----------+----------+--------------+ SFJ      Full                                                        +---------+---------------+---------+-----------+----------+--------------+ FV Prox  Full                                                        +---------+---------------+---------+-----------+----------+--------------+ FV Mid                  Yes      Yes                                 +---------+---------------+---------+-----------+----------+--------------+ FV Distal                Yes      Yes                                 +---------+---------------+---------+-----------+----------+--------------+ PFV      Full                                                        +---------+---------------+---------+-----------+----------+--------------+ POP      Full           Yes      Yes                                 +---------+---------------+---------+-----------+----------+--------------+ PTV      Full                                                        +---------+---------------+---------+-----------+----------+--------------+ PERO     Full                                                        +---------+---------------+---------+-----------+----------+--------------+     Summary: RIGHT: - No evidence of common femoral vein obstruction.   LEFT: - There is no evidence of deep vein thrombosis in the lower extremity. However, portions of this examination were limited- see technologist comments above.  - No cystic structure found in the popliteal fossa.  *See table(s) above for measurements and observations. Electronically signed by Lemar Livings MD on 02/23/2023 at 4:34:58 PM.    Final  DG HIP UNILAT WITH PELVIS 2-3 VIEWS LEFT Result Date: 02/06/2023 CLINICAL DATA:  Elective left hip surgery.  Intramedullary nail. EXAM: DG HIP (WITH OR WITHOUT PELVIS) 2-3V LEFT COMPARISON:  Left hip radiographs pelvis and left femur radiographs 02/05/2023 FINDINGS: Images were performed intraoperatively without the presence of a radiologist. The patient is undergoing long cephalomedullary nail fixation of the previously seen comminuted proximal left femoral intertrochanteric proximal femoral fracture. Improved alignment. Partial visualization of total left knee arthroplasty. Total fluoroscopy images: 8 Total fluoroscopy time: 163 seconds Total dose: Radiation Exposure Index (as provided by the fluoroscopic device): 20.9 mGy air Kerma Please see intraoperative findings  for further detail. IMPRESSION: Intraoperative fluoroscopic guidance for long cephalomedullary nail fixation of the previously seen comminuted proximal left femoral intertrochanteric proximal femoral fracture. Electronically Signed   By: Neita Garnet M.D.   On: 02/06/2023 12:58   DG C-Arm 1-60 Min-No Report Result Date: 02/06/2023 Fluoroscopy was utilized by the requesting physician.  No radiographic interpretation.   CT HEAD WO CONTRAST ( ) Result Date: 02/05/2023 CLINICAL DATA:  Head trauma EXAM: CT HEAD WITHOUT CONTRAST TECHNIQUE: Contiguous axial images were obtained from the base of the skull through the vertex without intravenous contrast. RADIATION DOSE REDUCTION: This exam was performed according to the departmental dose-optimization program which includes automated exposure control, adjustment of the mA and/or kV according to patient size and/or use of iterative reconstruction technique. COMPARISON:  10/23/2019 CT head FINDINGS: Brain: No evidence of acute infarction, hemorrhage, mass, mass effect, or midline shift. No hydrocephalus or extra-axial fluid collection. Age related cerebral atrophy. Periventricular white matter changes, likely the sequela of chronic small vessel ischemic disease. Vascular: No hyperdense vessel. Atherosclerotic calcifications in the intracranial carotid and vertebral arteries. Skull: Negative for fracture or focal lesion. Sinuses/Orbits: Mucosal thickening in the right sphenoid sinus and posterior left ethmoid air cells. Status post bilateral lens replacements. Other: The mastoid air cells are well aerated. IMPRESSION: No acute intracranial process. Electronically Signed   By: Wiliam Ke M.D.   On: 02/05/2023 15:46   DG Chest 1 View Result Date: 02/05/2023 CLINICAL DATA:  Fall. EXAM: CHEST  1 VIEW COMPARISON:  06/11/2019. FINDINGS: Bilateral lung fields are clear. Bilateral costophrenic angles are clear. Stable cardio-mediastinal silhouette. No acute osseous  abnormalities. The soft tissues are within normal limits. IMPRESSION: No active disease. Electronically Signed   By: Jules Schick M.D.   On: 02/05/2023 14:52   DG Femur Min 2 Views Left Result Date: 02/05/2023 CLINICAL DATA:  Fall.  Suspected hip fracture. EXAM: LEFT FEMUR 2 VIEWS; PELVIS - 1-2 VIEW COMPARISON:  None Available. FINDINGS: There is comminuted and angulated fracture of intertrochanteric left proximal femur. No other acute fracture or dislocation. No aggressive osseous lesion. Visualized sacral arcuate lines are unremarkable. Unremarkable symphysis pubis. There are mild degenerative changes of bilateral hip joints without significant joint space narrowing. Osteophytosis of the superior acetabulum. Patient is status post left knee arthroplasty. No radiopaque foreign bodies. IMPRESSION: *Comminuted and angulated fracture of the intertrochanteric left femur. Electronically Signed   By: Jules Schick M.D.   On: 02/05/2023 14:52   DG Pelvis 1-2 Views Result Date: 02/05/2023 CLINICAL DATA:  Fall.  Suspected hip fracture. EXAM: LEFT FEMUR 2 VIEWS; PELVIS - 1-2 VIEW COMPARISON:  None Available. FINDINGS: There is comminuted and angulated fracture of intertrochanteric left proximal femur. No other acute fracture or dislocation. No aggressive osseous lesion. Visualized sacral arcuate lines are unremarkable. Unremarkable symphysis pubis. There are mild degenerative changes of bilateral hip  joints without significant joint space narrowing. Osteophytosis of the superior acetabulum. Patient is status post left knee arthroplasty. No radiopaque foreign bodies. IMPRESSION: *Comminuted and angulated fracture of the intertrochanteric left femur. Electronically Signed   By: Jules Schick M.D.   On: 02/05/2023 14:52     Subjective: No acute issues or events overnight   Discharge Exam: Vitals:   02/24/23 0230 02/24/23 0950  BP: (!) 123/50 139/70  Pulse: 81 88  Resp: 17 16  Temp: 97.9 F (36.6 C) 97.6  F (36.4 C)  SpO2: 99% 100%   Vitals:   02/23/23 2000 02/23/23 2222 02/24/23 0230 02/24/23 0950  BP: 134/60 112/62 (!) 123/50 139/70  Pulse: 94 91 81 88  Resp: 19 17 17 16   Temp: (!) 97.3 F (36.3 C) 97.8 F (36.6 C) 97.9 F (36.6 C) 97.6 F (36.4 C)  TempSrc: Oral Oral Oral   SpO2: 100% 100% 99% 100%  Weight:  64 kg    Height:  5\' 4"  (1.626 m)      General: Pt is alert, awake, not in acute distress Cardiovascular: RRR, S1/S2 +, no rubs, no gallops Respiratory: CTA bilaterally, no wheezing, no rhonchi Abdominal: Soft, NT, ND, bowel sounds + Extremities: Blanching erythema right elbow improving   The results of significant diagnostics from this hospitalization (including imaging, microbiology, ancillary and laboratory) are listed below for reference.     Microbiology: Recent Results (from the past 240 hours)  Blood culture (routine x 2)     Status: None (Preliminary result)   Collection Time: 02/23/23  3:20 PM   Specimen: Left Antecubital; Blood  Result Value Ref Range Status   Specimen Description   Final    LEFT ANTECUBITAL Performed at [facility], [address] [place], [place] [address]    Special Requests   Final    BOTTLES DRAWN AEROBIC AND ANAEROBIC Blood Culture results may not be optimal due to an inadequate volume of blood received in culture bottles Performed at [facility], [address] [place], [place] [address]    Culture   Final    NO GROWTH < 12 HOURS Performed at [facility], [address], [place], [place] [address]    Report Status PENDING  Incomplete  Blood culture (routine x 2)     Status: None (Preliminary result)   Collection Time: 02/23/23  3:35 PM   Specimen: Right Antecubital; Blood  Result Value Ref Range Status   Specimen Description   Final    RIGHT ANTECUBITAL Performed at [facility], [address], [place], [place] [address]    Special Requests   Final     BOTTLES DRAWN AEROBIC AND ANAEROBIC Blood Culture results may not be optimal due to an inadequate volume of blood received in culture bottles Performed at [facility], [address] [place], [place] [address]    Culture   Final    NO GROWTH < 12 HOURS Performed at [facility], [address], [place], [place] [address]    Report Status PENDING  Incomplete     Labs: BNP (last 3 results) Recent Labs    02/23/23 1520  BNP 121.9*   Basic Metabolic Panel: Recent Labs  Lab 02/23/23 1520 02/24/23 0352  NA 138 139  K 4.3 4.0  CL 109 112*  CO2 20* 17*  GLUCOSE 108* 89  BUN 65* 60*  CREATININE 1.28* 1.19*  CALCIUM 8.6* 8.5*  MG 1.6*  2.2  PHOS  --  3.6   Liver Function Tests: Recent Labs  Lab 02/23/23 1520  AST 111*  ALT 127*  ALKPHOS 91  BILITOT 1.1  PROT 5.5*  ALBUMIN 2.3*   CBC: Recent Labs  Lab 02/23/23 1520 02/24/23 0352  WBC 18.4* 15.3*  NEUTROABS 15.3*  --   HGB 9.9* 9.9*  HCT 31.0* 31.8*  MCV 90.6 92.7  PLT 415* 370   Thyroid function studies Recent Labs    02/24/23 0352  TSH 4.988*   Urinalysis    Component Value Date/Time   COLORURINE STRAW (A) 02/24/2023 0051   APPEARANCEUR CLEAR 02/24/2023 0051   LABSPEC 1.012 02/24/2023 0051   PHURINE 5.0 02/24/2023 0051   GLUCOSEU NEGATIVE 02/24/2023 0051   HGBUR NEGATIVE 02/24/2023 0051   BILIRUBINUR NEGATIVE 02/24/2023 0051   KETONESUR NEGATIVE 02/24/2023 0051   PROTEINUR NEGATIVE 02/24/2023 0051   UROBILINOGEN 0.2 10/07/2014 1830   NITRITE NEGATIVE 02/24/2023 0051   LEUKOCYTESUR NEGATIVE 02/24/2023 0051   Sepsis Labs Recent Labs  Lab 02/23/23 1520 02/24/23 0352  WBC 18.4* 15.3*   Microbiology Recent Results (from the past 240 hours)  Blood culture (routine x 2)     Status: None (Preliminary result)   Collection Time: 02/23/23  3:20 PM   Specimen: Left Antecubital; Blood  Result Value Ref Range Status   Specimen Description   Final    LEFT  ANTECUBITAL Performed at [facility], [address], [place], [place] [address]    Special Requests   Final    BOTTLES DRAWN AEROBIC AND ANAEROBIC Blood Culture results may not be optimal due to an inadequate volume of blood received in culture bottles Performed at [facility], [address] [place], [place] [address]    Culture   Final    NO GROWTH < 12 HOURS Performed at [facility], [address] [place], [place] [address]    Report Status PENDING  Incomplete  Blood culture (routine x 2)     Status: None (Preliminary result)   Collection Time: 02/23/23  3:35 PM   Specimen: Right Antecubital; Blood  Result Value Ref Range Status   Specimen Description   Final    RIGHT ANTECUBITAL Performed at [facility], [address], [place], [place] [address]    Special Requests   Final    BOTTLES DRAWN AEROBIC AND ANAEROBIC Blood Culture results may not be optimal due to an inadequate volume of blood received in culture bottles Performed at [facility], [address], [place], [place] [address]    Culture   Final    NO GROWTH < 12 HOURS Performed at [facility], [address], [place], [place] [address]    Report Status PENDING  Incomplete     Time coordinating discharge: Over 30 minutes  SIGNED:   Azucena Fallen, DO [facility] 02/24/2023, 11:23 AM Pager   If 7PM-7AM, please contact night-coverage [email or web address]

## 2023-02-24 NOTE — Progress Notes (Signed)
[facility] arrived to transport patient. Daughter at bedside. No changes from am assessment. Pt is stable.

## 2023-02-25 DIAGNOSIS — B370 Candidal stomatitis: Secondary | ICD-10-CM | POA: Diagnosis not present

## 2023-02-25 DIAGNOSIS — E440 Moderate protein-calorie malnutrition: Secondary | ICD-10-CM | POA: Diagnosis not present

## 2023-02-25 DIAGNOSIS — L989 Disorder of the skin and subcutaneous tissue, unspecified: Secondary | ICD-10-CM | POA: Diagnosis not present

## 2023-02-25 DIAGNOSIS — R262 Difficulty in walking, not elsewhere classified: Secondary | ICD-10-CM | POA: Diagnosis not present

## 2023-02-25 DIAGNOSIS — G8929 Other chronic pain: Secondary | ICD-10-CM | POA: Diagnosis not present

## 2023-02-25 DIAGNOSIS — E039 Hypothyroidism, unspecified: Secondary | ICD-10-CM | POA: Diagnosis not present

## 2023-02-25 DIAGNOSIS — D509 Iron deficiency anemia, unspecified: Secondary | ICD-10-CM | POA: Diagnosis not present

## 2023-02-25 DIAGNOSIS — I150 Renovascular hypertension: Secondary | ICD-10-CM | POA: Diagnosis not present

## 2023-02-25 DIAGNOSIS — N183 Chronic kidney disease, stage 3 unspecified: Secondary | ICD-10-CM | POA: Diagnosis not present

## 2023-02-28 LAB — CULTURE, BLOOD (ROUTINE X 2)
Culture: NO GROWTH
Culture: NO GROWTH

## 2023-03-02 ENCOUNTER — Ambulatory Visit: Payer: Medicare PPO | Admitting: Gastroenterology

## 2023-03-07 DIAGNOSIS — S72142D Displaced intertrochanteric fracture of left femur, subsequent encounter for closed fracture with routine healing: Secondary | ICD-10-CM | POA: Diagnosis not present

## 2023-03-07 DIAGNOSIS — I10 Essential (primary) hypertension: Secondary | ICD-10-CM | POA: Diagnosis not present

## 2023-03-08 DIAGNOSIS — S72142A Displaced intertrochanteric fracture of left femur, initial encounter for closed fracture: Secondary | ICD-10-CM | POA: Diagnosis not present

## 2023-03-09 DIAGNOSIS — D509 Iron deficiency anemia, unspecified: Secondary | ICD-10-CM | POA: Diagnosis not present

## 2023-03-09 DIAGNOSIS — I5032 Chronic diastolic (congestive) heart failure: Secondary | ICD-10-CM | POA: Diagnosis not present

## 2023-03-09 DIAGNOSIS — N183 Chronic kidney disease, stage 3 unspecified: Secondary | ICD-10-CM | POA: Diagnosis not present

## 2023-03-09 DIAGNOSIS — S90822D Blister (nonthermal), left foot, subsequent encounter: Secondary | ICD-10-CM | POA: Diagnosis not present

## 2023-03-09 DIAGNOSIS — S72142D Displaced intertrochanteric fracture of left femur, subsequent encounter for closed fracture with routine healing: Secondary | ICD-10-CM | POA: Diagnosis not present

## 2023-03-09 DIAGNOSIS — D631 Anemia in chronic kidney disease: Secondary | ICD-10-CM | POA: Diagnosis not present

## 2023-03-09 DIAGNOSIS — I872 Venous insufficiency (chronic) (peripheral): Secondary | ICD-10-CM | POA: Diagnosis not present

## 2023-03-09 DIAGNOSIS — L89312 Pressure ulcer of right buttock, stage 2: Secondary | ICD-10-CM | POA: Diagnosis not present

## 2023-03-09 DIAGNOSIS — S80822D Blister (nonthermal), left lower leg, subsequent encounter: Secondary | ICD-10-CM | POA: Diagnosis not present

## 2023-03-09 DIAGNOSIS — S72302D Unspecified fracture of shaft of left femur, subsequent encounter for closed fracture with routine healing: Secondary | ICD-10-CM | POA: Diagnosis not present

## 2023-03-09 DIAGNOSIS — D62 Acute posthemorrhagic anemia: Secondary | ICD-10-CM | POA: Diagnosis not present

## 2023-03-09 DIAGNOSIS — L03115 Cellulitis of right lower limb: Secondary | ICD-10-CM | POA: Diagnosis not present

## 2023-03-09 DIAGNOSIS — L03116 Cellulitis of left lower limb: Secondary | ICD-10-CM | POA: Diagnosis not present

## 2023-03-09 DIAGNOSIS — I130 Hypertensive heart and chronic kidney disease with heart failure and stage 1 through stage 4 chronic kidney disease, or unspecified chronic kidney disease: Secondary | ICD-10-CM | POA: Diagnosis not present

## 2023-03-09 DIAGNOSIS — J4520 Mild intermittent asthma, uncomplicated: Secondary | ICD-10-CM | POA: Diagnosis not present

## 2023-03-09 DIAGNOSIS — J9611 Chronic respiratory failure with hypoxia: Secondary | ICD-10-CM | POA: Diagnosis not present

## 2023-03-09 DIAGNOSIS — I150 Renovascular hypertension: Secondary | ICD-10-CM | POA: Diagnosis not present

## 2023-03-11 DIAGNOSIS — L03116 Cellulitis of left lower limb: Secondary | ICD-10-CM | POA: Diagnosis not present

## 2023-03-11 DIAGNOSIS — I5032 Chronic diastolic (congestive) heart failure: Secondary | ICD-10-CM | POA: Diagnosis not present

## 2023-03-11 DIAGNOSIS — S90822D Blister (nonthermal), left foot, subsequent encounter: Secondary | ICD-10-CM | POA: Diagnosis not present

## 2023-03-11 DIAGNOSIS — I130 Hypertensive heart and chronic kidney disease with heart failure and stage 1 through stage 4 chronic kidney disease, or unspecified chronic kidney disease: Secondary | ICD-10-CM | POA: Diagnosis not present

## 2023-03-11 DIAGNOSIS — J4520 Mild intermittent asthma, uncomplicated: Secondary | ICD-10-CM | POA: Diagnosis not present

## 2023-03-11 DIAGNOSIS — L89312 Pressure ulcer of right buttock, stage 2: Secondary | ICD-10-CM | POA: Diagnosis not present

## 2023-03-11 DIAGNOSIS — S72142D Displaced intertrochanteric fracture of left femur, subsequent encounter for closed fracture with routine healing: Secondary | ICD-10-CM | POA: Diagnosis not present

## 2023-03-11 DIAGNOSIS — J9611 Chronic respiratory failure with hypoxia: Secondary | ICD-10-CM | POA: Diagnosis not present

## 2023-03-11 DIAGNOSIS — S80822D Blister (nonthermal), left lower leg, subsequent encounter: Secondary | ICD-10-CM | POA: Diagnosis not present

## 2023-03-12 DIAGNOSIS — E039 Hypothyroidism, unspecified: Secondary | ICD-10-CM | POA: Diagnosis not present

## 2023-03-12 DIAGNOSIS — N183 Chronic kidney disease, stage 3 unspecified: Secondary | ICD-10-CM | POA: Diagnosis not present

## 2023-03-12 DIAGNOSIS — I10 Essential (primary) hypertension: Secondary | ICD-10-CM | POA: Diagnosis not present

## 2023-03-12 DIAGNOSIS — D649 Anemia, unspecified: Secondary | ICD-10-CM | POA: Diagnosis not present

## 2023-03-12 DIAGNOSIS — R609 Edema, unspecified: Secondary | ICD-10-CM | POA: Diagnosis not present

## 2023-03-14 ENCOUNTER — Inpatient Hospital Stay (HOSPITAL_BASED_OUTPATIENT_CLINIC_OR_DEPARTMENT_OTHER)
Admission: EM | Admit: 2023-03-14 | Discharge: 2023-03-17 | DRG: 603 | Disposition: A | Discharge status: Home Health | Attending: Internal Medicine | Admitting: Internal Medicine

## 2023-03-14 ENCOUNTER — Encounter (HOSPITAL_BASED_OUTPATIENT_CLINIC_OR_DEPARTMENT_OTHER): Payer: Self-pay | Admitting: Emergency Medicine

## 2023-03-14 ENCOUNTER — Other Ambulatory Visit: Payer: Self-pay

## 2023-03-14 DIAGNOSIS — Z8249 Family history of ischemic heart disease and other diseases of the circulatory system: Secondary | ICD-10-CM

## 2023-03-14 DIAGNOSIS — E782 Mixed hyperlipidemia: Secondary | ICD-10-CM | POA: Diagnosis not present

## 2023-03-14 DIAGNOSIS — Z882 Allergy status to sulfonamides status: Secondary | ICD-10-CM

## 2023-03-14 DIAGNOSIS — J4520 Mild intermittent asthma, uncomplicated: Secondary | ICD-10-CM | POA: Diagnosis present

## 2023-03-14 DIAGNOSIS — R5381 Other malaise: Secondary | ICD-10-CM | POA: Diagnosis present

## 2023-03-14 DIAGNOSIS — Z9981 Dependence on supplemental oxygen: Secondary | ICD-10-CM

## 2023-03-14 DIAGNOSIS — J9611 Chronic respiratory failure with hypoxia: Secondary | ICD-10-CM | POA: Diagnosis present

## 2023-03-14 DIAGNOSIS — N189 Chronic kidney disease, unspecified: Secondary | ICD-10-CM | POA: Diagnosis not present

## 2023-03-14 DIAGNOSIS — Z90710 Acquired absence of both cervix and uterus: Secondary | ICD-10-CM

## 2023-03-14 DIAGNOSIS — I10 Essential (primary) hypertension: Secondary | ICD-10-CM | POA: Diagnosis present

## 2023-03-14 DIAGNOSIS — Z79899 Other long term (current) drug therapy: Secondary | ICD-10-CM

## 2023-03-14 DIAGNOSIS — M109 Gout, unspecified: Secondary | ICD-10-CM | POA: Diagnosis present

## 2023-03-14 DIAGNOSIS — L03116 Cellulitis of left lower limb: Principal | ICD-10-CM | POA: Diagnosis present

## 2023-03-14 DIAGNOSIS — S90822A Blister (nonthermal), left foot, initial encounter: Secondary | ICD-10-CM | POA: Diagnosis present

## 2023-03-14 DIAGNOSIS — L89322 Pressure ulcer of left buttock, stage 2: Secondary | ICD-10-CM | POA: Diagnosis present

## 2023-03-14 DIAGNOSIS — Z881 Allergy status to other antibiotic agents status: Secondary | ICD-10-CM | POA: Diagnosis not present

## 2023-03-14 DIAGNOSIS — Z862 Personal history of diseases of the blood and blood-forming organs and certain disorders involving the immune mechanism: Secondary | ICD-10-CM

## 2023-03-14 DIAGNOSIS — E871 Hypo-osmolality and hyponatremia: Secondary | ICD-10-CM | POA: Diagnosis present

## 2023-03-14 DIAGNOSIS — N179 Acute kidney failure, unspecified: Secondary | ICD-10-CM | POA: Diagnosis present

## 2023-03-14 DIAGNOSIS — Z8673 Personal history of transient ischemic attack (TIA), and cerebral infarction without residual deficits: Secondary | ICD-10-CM

## 2023-03-14 DIAGNOSIS — I350 Nonrheumatic aortic (valve) stenosis: Secondary | ICD-10-CM

## 2023-03-14 DIAGNOSIS — I5032 Chronic diastolic (congestive) heart failure: Secondary | ICD-10-CM | POA: Diagnosis present

## 2023-03-14 DIAGNOSIS — H903 Sensorineural hearing loss, bilateral: Secondary | ICD-10-CM | POA: Diagnosis not present

## 2023-03-14 DIAGNOSIS — E039 Hypothyroidism, unspecified: Secondary | ICD-10-CM | POA: Diagnosis present

## 2023-03-14 DIAGNOSIS — Z79890 Hormone replacement therapy (postmenopausal): Secondary | ICD-10-CM

## 2023-03-14 DIAGNOSIS — Z66 Do not resuscitate: Secondary | ICD-10-CM | POA: Diagnosis present

## 2023-03-14 DIAGNOSIS — I130 Hypertensive heart and chronic kidney disease with heart failure and stage 1 through stage 4 chronic kidney disease, or unspecified chronic kidney disease: Secondary | ICD-10-CM | POA: Diagnosis present

## 2023-03-14 DIAGNOSIS — I878 Other specified disorders of veins: Secondary | ICD-10-CM | POA: Diagnosis present

## 2023-03-14 DIAGNOSIS — X58XXXA Exposure to other specified factors, initial encounter: Secondary | ICD-10-CM | POA: Diagnosis present

## 2023-03-14 DIAGNOSIS — D631 Anemia in chronic kidney disease: Secondary | ICD-10-CM | POA: Diagnosis present

## 2023-03-14 DIAGNOSIS — E785 Hyperlipidemia, unspecified: Secondary | ICD-10-CM | POA: Diagnosis present

## 2023-03-14 DIAGNOSIS — M19072 Primary osteoarthritis, left ankle and foot: Secondary | ICD-10-CM | POA: Diagnosis not present

## 2023-03-14 DIAGNOSIS — M85872 Other specified disorders of bone density and structure, left ankle and foot: Secondary | ICD-10-CM | POA: Diagnosis not present

## 2023-03-14 DIAGNOSIS — Z7982 Long term (current) use of aspirin: Secondary | ICD-10-CM | POA: Diagnosis not present

## 2023-03-14 DIAGNOSIS — I959 Hypotension, unspecified: Secondary | ICD-10-CM | POA: Diagnosis present

## 2023-03-14 DIAGNOSIS — N1832 Chronic kidney disease, stage 3b: Secondary | ICD-10-CM | POA: Diagnosis present

## 2023-03-14 DIAGNOSIS — J301 Allergic rhinitis due to pollen: Secondary | ICD-10-CM | POA: Diagnosis not present

## 2023-03-14 DIAGNOSIS — L89312 Pressure ulcer of right buttock, stage 2: Secondary | ICD-10-CM | POA: Diagnosis present

## 2023-03-14 DIAGNOSIS — Z803 Family history of malignant neoplasm of breast: Secondary | ICD-10-CM

## 2023-03-14 DIAGNOSIS — K219 Gastro-esophageal reflux disease without esophagitis: Secondary | ICD-10-CM | POA: Diagnosis present

## 2023-03-14 DIAGNOSIS — J309 Allergic rhinitis, unspecified: Secondary | ICD-10-CM | POA: Diagnosis present

## 2023-03-14 LAB — CBC WITH DIFFERENTIAL/PLATELET
Abs Immature Granulocytes: 0.14 10*3/uL — ABNORMAL HIGH (ref 0.00–0.07)
Basophils Absolute: 0.1 10*3/uL (ref 0.0–0.1)
Basophils Relative: 1 %
Eosinophils Absolute: 0.2 10*3/uL (ref 0.0–0.5)
Eosinophils Relative: 2 %
HCT: 27.7 % — ABNORMAL LOW (ref 36.0–46.0)
Hemoglobin: 8.7 g/dL — ABNORMAL LOW (ref 12.0–15.0)
Immature Granulocytes: 2 %
Lymphocytes Relative: 16 %
Lymphs Abs: 1.4 10*3/uL (ref 0.7–4.0)
MCH: 28.7 pg (ref 26.0–34.0)
MCHC: 31.4 g/dL (ref 30.0–36.0)
MCV: 91.4 fL (ref 80.0–100.0)
Monocytes Absolute: 0.4 10*3/uL (ref 0.1–1.0)
Monocytes Relative: 4 %
Neutro Abs: 7 10*3/uL (ref 1.7–7.7)
Neutrophils Relative %: 75 %
Platelets: 235 10*3/uL (ref 150–400)
RBC: 3.03 MIL/uL — ABNORMAL LOW (ref 3.87–5.11)
RDW: 17.6 % — ABNORMAL HIGH (ref 11.5–15.5)
WBC: 9.2 10*3/uL (ref 4.0–10.5)
nRBC: 0.4 % — ABNORMAL HIGH (ref 0.0–0.2)

## 2023-03-14 LAB — COMPREHENSIVE METABOLIC PANEL
ALT: 13 U/L (ref 0–44)
AST: 14 U/L — ABNORMAL LOW (ref 15–41)
Albumin: 2.8 g/dL — ABNORMAL LOW (ref 3.5–5.0)
Alkaline Phosphatase: 122 U/L (ref 38–126)
Anion gap: 9 (ref 5–15)
BUN: 40 mg/dL — ABNORMAL HIGH (ref 8–23)
CO2: 21 mmol/L — ABNORMAL LOW (ref 22–32)
Calcium: 8.3 mg/dL — ABNORMAL LOW (ref 8.9–10.3)
Chloride: 103 mmol/L (ref 98–111)
Creatinine, Ser: 1.84 mg/dL — ABNORMAL HIGH (ref 0.44–1.00)
GFR, Estimated: 26 mL/min — ABNORMAL LOW (ref >60)
Glucose, Bld: 114 mg/dL — ABNORMAL HIGH (ref 70–99)
Potassium: 4 mmol/L (ref 3.5–5.1)
Sodium: 133 mmol/L — ABNORMAL LOW (ref 135–145)
Total Bilirubin: 0.9 mg/dL (ref 0.0–1.2)
Total Protein: 5.7 g/dL — ABNORMAL LOW (ref 6.5–8.1)

## 2023-03-14 LAB — LACTIC ACID, PLASMA: Lactic Acid, Venous: 1.6 mmol/L (ref 0.5–1.9)

## 2023-03-14 LAB — BRAIN NATRIURETIC PEPTIDE: B Natriuretic Peptide: 127.9 pg/mL — ABNORMAL HIGH (ref 0.0–100.0)

## 2023-03-14 LAB — PROTIME-INR
INR: 1.2 (ref 0.8–1.2)
Prothrombin Time: 15.2 s (ref 11.4–15.2)

## 2023-03-14 MED ORDER — CEFAZOLIN SODIUM-DEXTROSE 1-4 GM/50ML-% IV SOLN
1.0000 g | Freq: Once | INTRAVENOUS | Status: AC
  Administered 2023-03-15: 1 g via INTRAVENOUS
  Filled 2023-03-14: qty 50

## 2023-03-14 NOTE — ED Provider Notes (Signed)
 [place] EMERGENCY DEPARTMENT AT [facility] Provider Note   CSN: [id number] Arrival date & time: 03/14/23  2031     History  Chief Complaint  Patient presents with   Leg Swelling    Sharon Bailey is a 89 y.o. female.  Patient is a 89 year old female presenting for bilateral lower extremity swelling with worsening swelling on the left and new erythema on the dorsum surface of the foot that started within the last 24 hours.  Patient is actually being treated for cellulitis of her elbow with outpatient Keflex.  She is on her last dose today.  Her symptoms have resolved in the elbow.  She denies any fevers, chills, myalgias.  Her daughters were at the bedside states she has been much more tired today than she typically is and has taken multiple naps which is abnormal for her.  The history is provided by the patient. No language interpreter was used.       Home Medications Prior to Admission medications   Medication Sig Start Date End Date Taking? Authorizing Provider  acetaminophen (TYLENOL) 325 MG tablet Take 650 mg by mouth 2 (two) times daily as needed (for pain).    [provider]  acetaminophen (TYLENOL) 500 MG tablet Take 2 tablets (1,000 mg total) by mouth every 8 (eight) hours as needed for mild pain (pain score 1-3). Patient taking differently: Take 1,000 mg by mouth 3 (three) times daily. 02/10/23   Rai, Delene Ruffini, MD  allopurinol (ZYLOPRIM) 300 MG tablet Take 0.5 tablets (150 mg total) by mouth daily. 02/10/23   Rai, Ripudeep K, MD  amLODipine (NORVASC) 5 MG tablet TAKE 1 TABLET (5 MG TOTAL) BY MOUTH DAILY. 10/22/22   Jodelle Red, MD  aspirin EC 81 MG tablet Take 81 mg by mouth daily. Swallow whole.    [provider]  budesonide (ENTOCORT EC) 3 MG 24 hr capsule TAKE 1 CAPSULE EVERY DAY AS DIRECTED Patient taking differently: Take 3 mg by mouth daily. 03/04/22   Hilarie Fredrickson, MD  ferrous sulfate 325 (65 FE) MG EC tablet Take 325 mg by  mouth daily with breakfast.    [provider]  levothyroxine (SYNTHROID) 88 MCG tablet Take 88 mcg by mouth in the morning.    [provider]  OXYGEN Inhale 2 L/min into the lungs as needed (for shortness of breath or chest pain).    [provider]  polyethylene glycol (MIRALAX / GLYCOLAX) 17 g packet Take 17 g by mouth daily as needed for moderate constipation. Patient taking differently: Take 17 g by mouth daily as needed (for constipation). 02/10/23   Rai, Delene Ruffini, MD  simvastatin (ZOCOR) 40 MG tablet Take 40 mg by mouth every evening.    [provider]  traMADol (ULTRAM) 50 MG tablet Take 1 tablet (50 mg total) by mouth every 8 (eight) hours as needed for severe pain (pain score 7-10). 02/10/23   Rai, Ripudeep K, MD  vitamin B-12 1000 MCG tablet Take 1 tablet (1,000 mcg total) by mouth daily. 06/14/19   Joseph Art, DO  ZYRTEC ALLERGY 10 MG tablet Take 10 mg by mouth at bedtime. 02/17/23 02/24/23  [provider]      Allergies    Bactrim [sulfamethoxazole-trimethoprim] and Cipro [ciprofloxacin hcl]    Review of Systems   Review of Systems  Constitutional:  Negative for chills and fever.  HENT:  Negative for ear pain and sore throat.   Eyes:  Negative for pain  and visual disturbance.  Respiratory:  Negative for cough and shortness of breath.   Cardiovascular:  Positive for leg swelling. Negative for chest pain and palpitations.  Gastrointestinal:  Negative for abdominal pain and vomiting.  Genitourinary:  Negative for dysuria and hematuria.  Musculoskeletal:  Negative for arthralgias and back pain.  Skin:  Positive for color change. Negative for rash.  Neurological:  Negative for seizures and syncope.  All other systems reviewed and are negative.   Physical Exam Updated Vital Signs BP (!) 104/51   Pulse 92   Temp (!) 97.5 F (36.4 C)   Resp 20   SpO2 93%  Physical Exam Vitals and nursing note reviewed.  Constitutional:       General: She is not in acute distress.    Appearance: She is well-developed.  HENT:     Head: Normocephalic and atraumatic.  Eyes:     Conjunctiva/sclera: Conjunctivae normal.  Cardiovascular:     Rate and Rhythm: Normal rate and regular rhythm.     Pulses:          Dorsalis pedis pulses are 2+ on the right side and detected w/ Doppler on the left side.     Heart sounds: No murmur heard. Pulmonary:     Effort: Pulmonary effort is normal. No respiratory distress.     Breath sounds: Normal breath sounds.  Abdominal:     Palpations: Abdomen is soft.     Tenderness: There is no abdominal tenderness.  Musculoskeletal:        General: No swelling.     Cervical back: Neck supple.     Right lower leg: Edema present.     Left lower leg: Edema present.  Skin:    General: Skin is warm and dry.     Capillary Refill: Capillary refill takes less than 2 seconds.       Neurological:     Mental Status: She is alert.  Psychiatric:        Mood and Affect: Mood normal.          ED Results / Procedures / Treatments   Labs (all labs ordered are listed, but only abnormal results are displayed) Labs Reviewed  COMPREHENSIVE METABOLIC PANEL - Abnormal; Notable for the following components:      Result Value   Sodium 133 (*)    CO2 21 (*)    Glucose, Bld 114 (*)    BUN 40 (*)    Creatinine, Ser 1.84 (*)    Calcium 8.3 (*)    Total Protein 5.7 (*)    Albumin 2.8 (*)    AST 14 (*)    GFR, Estimated 26 (*)    All other components within normal limits  CBC WITH DIFFERENTIAL/PLATELET - Abnormal; Notable for the following components:   RBC 3.03 (*)    Hemoglobin 8.7 (*)    HCT 27.7 (*)    RDW 17.6 (*)    nRBC 0.4 (*)    Abs Immature Granulocytes 0.14 (*)    All other components within normal limits  BRAIN NATRIURETIC PEPTIDE - Abnormal; Notable for the following components:   B Natriuretic Peptide 127.9 (*)    All other components within normal limits  CULTURE, BLOOD (ROUTINE X 2)   CULTURE, BLOOD (ROUTINE X 2)  LACTIC ACID, PLASMA  PROTIME-INR    EKG None  Radiology No results found.  Procedures Procedures    Medications Ordered in ED Medications  ceFAZolin (ANCEF) IVPB 1 g/50 mL premix (has  no administration in time range)    ED Course/ Medical Decision Making/ A&P                                 Medical Decision Making Amount and/or Complexity of Data Reviewed Labs: ordered.  Risk Prescription drug management. Decision regarding hospitalization.   89 year old female presenting for bilateral lower extremity swelling with worsening swelling on the left and new erythema on the dorsum surface of the foot that started within the last 24 hours.  Patient is alert and oriented x 3, afebrile, blood pressure 101/53 with otherwise stable vital signs.  Physical exam demonstrates. Dorsum of left foot is erythematous.  Nontender.  Bilateral pitting edema lower extremities +3 up to the knees.  Multiple fluid-filled blisters on bilateral lower extremities.  Patient has palpable pulses in the right lower extremity and dopplered pulses on left lower extremity.  Signs and symptoms are concerning for cellulitis versus erysipelas.  Antibiotics started.  Patient recommended for IV antibiotics and admission due to technically failed outpatient therapy.  Patient was already on Keflex for elbow cellulitis when she developed this infection.  The bilateral lower extremity swelling has been progressive since the patient's left hip fracture and intramedullary nail trochanteric surgical procedure.  Since this time patient has had bilateral lower extremity ultrasounds to rule out DVT.  She has also had BNP (129) trending to look for signs of congestive heart failure.  Repeat BNP today stable at 129.  No shortness of breath, chest pressure, heart abnormal lung sounds on auscultation to suggest heart failure.  Patient wearing multiple reps of Ace wrap for compression.  She was also told  that she had a protein abnormality that was resulting in her lower extremity edema.  Patient accepted by admitting team.        Final Clinical Impression(s) / ED Diagnoses Final diagnoses:  Cellulitis of left lower extremity    Rx / DC Orders ED Discharge Orders     None         Franne Forts, DO 03/15/23 0002

## 2023-03-14 NOTE — ED Triage Notes (Signed)
 Chronic lower limb edema leading to weeping and blisters- has become infected bilateral feet. Is finishing antibiotics for elbow cellulitis. Hypotensive at home.

## 2023-03-14 NOTE — ED Notes (Signed)
 ED Provider at bedside.

## 2023-03-15 ENCOUNTER — Inpatient Hospital Stay (HOSPITAL_COMMUNITY)

## 2023-03-15 ENCOUNTER — Other Ambulatory Visit (HOSPITAL_COMMUNITY)

## 2023-03-15 ENCOUNTER — Encounter (HOSPITAL_COMMUNITY): Payer: Self-pay | Admitting: Family Medicine

## 2023-03-15 ENCOUNTER — Other Ambulatory Visit: Payer: Self-pay

## 2023-03-15 DIAGNOSIS — Z8249 Family history of ischemic heart disease and other diseases of the circulatory system: Secondary | ICD-10-CM | POA: Diagnosis not present

## 2023-03-15 DIAGNOSIS — J4520 Mild intermittent asthma, uncomplicated: Secondary | ICD-10-CM | POA: Diagnosis present

## 2023-03-15 DIAGNOSIS — Z79890 Hormone replacement therapy (postmenopausal): Secondary | ICD-10-CM | POA: Diagnosis not present

## 2023-03-15 DIAGNOSIS — I350 Nonrheumatic aortic (valve) stenosis: Secondary | ICD-10-CM | POA: Diagnosis present

## 2023-03-15 DIAGNOSIS — L03115 Cellulitis of right lower limb: Secondary | ICD-10-CM | POA: Insufficient documentation

## 2023-03-15 DIAGNOSIS — E871 Hypo-osmolality and hyponatremia: Secondary | ICD-10-CM | POA: Diagnosis present

## 2023-03-15 DIAGNOSIS — E039 Hypothyroidism, unspecified: Secondary | ICD-10-CM

## 2023-03-15 DIAGNOSIS — M85872 Other specified disorders of bone density and structure, left ankle and foot: Secondary | ICD-10-CM | POA: Diagnosis not present

## 2023-03-15 DIAGNOSIS — Z9981 Dependence on supplemental oxygen: Secondary | ICD-10-CM | POA: Diagnosis not present

## 2023-03-15 DIAGNOSIS — J301 Allergic rhinitis due to pollen: Secondary | ICD-10-CM

## 2023-03-15 DIAGNOSIS — Z79899 Other long term (current) drug therapy: Secondary | ICD-10-CM | POA: Diagnosis not present

## 2023-03-15 DIAGNOSIS — Z881 Allergy status to other antibiotic agents status: Secondary | ICD-10-CM | POA: Diagnosis not present

## 2023-03-15 DIAGNOSIS — M19072 Primary osteoarthritis, left ankle and foot: Secondary | ICD-10-CM | POA: Diagnosis not present

## 2023-03-15 DIAGNOSIS — L03116 Cellulitis of left lower limb: Principal | ICD-10-CM | POA: Diagnosis present

## 2023-03-15 DIAGNOSIS — N189 Chronic kidney disease, unspecified: Secondary | ICD-10-CM | POA: Diagnosis not present

## 2023-03-15 DIAGNOSIS — I878 Other specified disorders of veins: Secondary | ICD-10-CM | POA: Diagnosis present

## 2023-03-15 DIAGNOSIS — N179 Acute kidney failure, unspecified: Secondary | ICD-10-CM

## 2023-03-15 DIAGNOSIS — Z882 Allergy status to sulfonamides status: Secondary | ICD-10-CM | POA: Diagnosis not present

## 2023-03-15 DIAGNOSIS — E785 Hyperlipidemia, unspecified: Secondary | ICD-10-CM | POA: Diagnosis present

## 2023-03-15 DIAGNOSIS — L89322 Pressure ulcer of left buttock, stage 2: Secondary | ICD-10-CM | POA: Diagnosis present

## 2023-03-15 DIAGNOSIS — Z862 Personal history of diseases of the blood and blood-forming organs and certain disorders involving the immune mechanism: Secondary | ICD-10-CM

## 2023-03-15 DIAGNOSIS — L89312 Pressure ulcer of right buttock, stage 2: Secondary | ICD-10-CM | POA: Diagnosis present

## 2023-03-15 DIAGNOSIS — N1832 Chronic kidney disease, stage 3b: Secondary | ICD-10-CM

## 2023-03-15 DIAGNOSIS — I10 Essential (primary) hypertension: Secondary | ICD-10-CM | POA: Diagnosis not present

## 2023-03-15 DIAGNOSIS — J9611 Chronic respiratory failure with hypoxia: Secondary | ICD-10-CM | POA: Diagnosis present

## 2023-03-15 DIAGNOSIS — S90822A Blister (nonthermal), left foot, initial encounter: Secondary | ICD-10-CM | POA: Diagnosis present

## 2023-03-15 DIAGNOSIS — D631 Anemia in chronic kidney disease: Secondary | ICD-10-CM | POA: Diagnosis present

## 2023-03-15 DIAGNOSIS — Z7982 Long term (current) use of aspirin: Secondary | ICD-10-CM | POA: Diagnosis not present

## 2023-03-15 DIAGNOSIS — I5032 Chronic diastolic (congestive) heart failure: Secondary | ICD-10-CM | POA: Diagnosis present

## 2023-03-15 DIAGNOSIS — Z66 Do not resuscitate: Secondary | ICD-10-CM | POA: Diagnosis present

## 2023-03-15 DIAGNOSIS — X58XXXA Exposure to other specified factors, initial encounter: Secondary | ICD-10-CM | POA: Diagnosis present

## 2023-03-15 DIAGNOSIS — I130 Hypertensive heart and chronic kidney disease with heart failure and stage 1 through stage 4 chronic kidney disease, or unspecified chronic kidney disease: Secondary | ICD-10-CM | POA: Diagnosis present

## 2023-03-15 DIAGNOSIS — E782 Mixed hyperlipidemia: Secondary | ICD-10-CM

## 2023-03-15 LAB — COMPREHENSIVE METABOLIC PANEL
ALT: 12 U/L (ref 0–44)
AST: 16 U/L (ref 15–41)
Albumin: 1.8 g/dL — ABNORMAL LOW (ref 3.5–5.0)
Alkaline Phosphatase: 95 U/L (ref 38–126)
Anion gap: 8 (ref 5–15)
BUN: 41 mg/dL — ABNORMAL HIGH (ref 8–23)
CO2: 21 mmol/L — ABNORMAL LOW (ref 22–32)
Calcium: 7.8 mg/dL — ABNORMAL LOW (ref 8.9–10.3)
Chloride: 107 mmol/L (ref 98–111)
Creatinine, Ser: 1.61 mg/dL — ABNORMAL HIGH (ref 0.44–1.00)
GFR, Estimated: 30 mL/min — ABNORMAL LOW (ref >60)
Glucose, Bld: 124 mg/dL — ABNORMAL HIGH (ref 70–99)
Potassium: 3.9 mmol/L (ref 3.5–5.1)
Sodium: 136 mmol/L (ref 135–145)
Total Bilirubin: 0.9 mg/dL (ref 0.0–1.2)
Total Protein: 4.8 g/dL — ABNORMAL LOW (ref 6.5–8.1)

## 2023-03-15 LAB — ECHOCARDIOGRAM COMPLETE
AR max vel: 1.09 cm2
AV Area VTI: 1.16 cm2
AV Area mean vel: 1.09 cm2
AV Mean grad: 14 mmHg
AV Peak grad: 25.2 mmHg
Ao pk vel: 2.51 m/s
Area-P 1/2: 3.77 cm2
Height: 64 in
S' Lateral: 3.1 cm
Weight: 2257.51 [oz_av]

## 2023-03-15 LAB — RETICULOCYTES
Immature Retic Fract: 39.8 % — ABNORMAL HIGH (ref 2.3–15.9)
RBC.: 2.48 MIL/uL — ABNORMAL LOW (ref 3.87–5.11)
Retic Count, Absolute: 122.8 10*3/uL (ref 19.0–186.0)
Retic Ct Pct: 5 % — ABNORMAL HIGH (ref 0.4–3.1)

## 2023-03-15 LAB — FERRITIN: Ferritin: 513 ng/mL — ABNORMAL HIGH (ref 11–307)

## 2023-03-15 LAB — CBC WITH DIFFERENTIAL/PLATELET
Abs Immature Granulocytes: 0.19 10*3/uL — ABNORMAL HIGH (ref 0.00–0.07)
Basophils Absolute: 0 10*3/uL (ref 0.0–0.1)
Basophils Relative: 0 %
Eosinophils Absolute: 0 10*3/uL (ref 0.0–0.5)
Eosinophils Relative: 0 %
HCT: 25.1 % — ABNORMAL LOW (ref 36.0–46.0)
Hemoglobin: 7.7 g/dL — ABNORMAL LOW (ref 12.0–15.0)
Immature Granulocytes: 3 %
Lymphocytes Relative: 13 %
Lymphs Abs: 0.9 10*3/uL (ref 0.7–4.0)
MCH: 29.1 pg (ref 26.0–34.0)
MCHC: 30.7 g/dL (ref 30.0–36.0)
MCV: 94.7 fL (ref 80.0–100.0)
Monocytes Absolute: 0.3 10*3/uL (ref 0.1–1.0)
Monocytes Relative: 4 %
Neutro Abs: 5.8 10*3/uL (ref 1.7–7.7)
Neutrophils Relative %: 80 %
Platelets: 203 10*3/uL (ref 150–400)
RBC: 2.65 MIL/uL — ABNORMAL LOW (ref 3.87–5.11)
RDW: 17.4 % — ABNORMAL HIGH (ref 11.5–15.5)
WBC: 7.2 10*3/uL (ref 4.0–10.5)
nRBC: 0.3 % — ABNORMAL HIGH (ref 0.0–0.2)

## 2023-03-15 LAB — IRON AND TIBC
Iron: 35 ug/dL (ref 28–170)
Saturation Ratios: 22 % (ref 10.4–31.8)
TIBC: 160 ug/dL — ABNORMAL LOW (ref 250–450)
UIBC: 125 ug/dL

## 2023-03-15 LAB — CK: Total CK: 19 U/L — ABNORMAL LOW (ref 38–234)

## 2023-03-15 LAB — FOLATE: Folate: 23.7 ng/mL (ref >5.9)

## 2023-03-15 LAB — C-REACTIVE PROTEIN: CRP: 6.3 mg/dL — ABNORMAL HIGH (ref <1.0)

## 2023-03-15 LAB — VITAMIN B12: Vitamin B-12: 3869 pg/mL — ABNORMAL HIGH (ref 180–914)

## 2023-03-15 LAB — URIC ACID: Uric Acid, Serum: 5.8 mg/dL (ref 2.5–7.1)

## 2023-03-15 LAB — OSMOLALITY: Osmolality: 303 mosm/kg — ABNORMAL HIGH (ref 275–295)

## 2023-03-15 LAB — SEDIMENTATION RATE: Sed Rate: 27 mm/h — ABNORMAL HIGH (ref 0–22)

## 2023-03-15 LAB — MAGNESIUM: Magnesium: 1.4 mg/dL — ABNORMAL LOW (ref 1.7–2.4)

## 2023-03-15 MED ORDER — BUDESONIDE 3 MG PO CPEP
3.0000 mg | ORAL_CAPSULE | Freq: Every day | ORAL | Status: DC
Start: 2023-03-15 — End: 2023-03-17
  Administered 2023-03-15 – 2023-03-17 (×3): 3 mg via ORAL
  Filled 2023-03-15 (×3): qty 1

## 2023-03-15 MED ORDER — ACETAMINOPHEN 650 MG RE SUPP: 650.0000 mg | Freq: Four times a day (QID) | RECTAL | Status: DC | PRN

## 2023-03-15 MED ORDER — FERROUS SULFATE 325 (65 FE) MG PO TABS
325.0000 mg | ORAL_TABLET | Freq: Every day | ORAL | Status: DC
  Administered 2023-03-15 – 2023-03-17 (×3): 325 mg via ORAL
  Filled 2023-03-15 (×3): qty 1

## 2023-03-15 MED ORDER — ALBUTEROL SULFATE (2.5 MG/3ML) 0.083% IN NEBU: 2.5000 mg | INHALATION_SOLUTION | RESPIRATORY_TRACT | Status: DC | PRN

## 2023-03-15 MED ORDER — MELATONIN 3 MG PO TABS
3.0000 mg | ORAL_TABLET | Freq: Once | ORAL | Status: AC
  Administered 2023-03-15: 3 mg via ORAL
  Filled 2023-03-15: qty 1

## 2023-03-15 MED ORDER — SODIUM CHLORIDE 0.9 % IV SOLN: INTRAVENOUS | Status: DC

## 2023-03-15 MED ORDER — ALLOPURINOL 300 MG PO TABS
150.0000 mg | ORAL_TABLET | Freq: Every day | ORAL | Status: DC
  Administered 2023-03-15 – 2023-03-17 (×3): 150 mg via ORAL
  Filled 2023-03-15 (×3): qty 1

## 2023-03-15 MED ORDER — ASPIRIN 81 MG PO TBEC
81.0000 mg | DELAYED_RELEASE_TABLET | Freq: Every day | ORAL | Status: DC
  Administered 2023-03-15 – 2023-03-17 (×3): 81 mg via ORAL
  Filled 2023-03-15 (×3): qty 1

## 2023-03-15 MED ORDER — ACETAMINOPHEN 325 MG PO TABS: 650.0000 mg | ORAL_TABLET | Freq: Four times a day (QID) | ORAL | Status: DC | PRN

## 2023-03-15 MED ORDER — LORATADINE 10 MG PO TABS
10.0000 mg | ORAL_TABLET | Freq: Every day | ORAL | Status: DC
  Administered 2023-03-15 – 2023-03-17 (×3): 10 mg via ORAL
  Filled 2023-03-15 (×3): qty 1

## 2023-03-15 MED ORDER — SIMVASTATIN 20 MG PO TABS
40.0000 mg | ORAL_TABLET | Freq: Every evening | ORAL | Status: DC
  Administered 2023-03-15: 40 mg via ORAL
  Filled 2023-03-15: qty 2

## 2023-03-15 MED ORDER — CEFAZOLIN SODIUM-DEXTROSE 2-4 GM/100ML-% IV SOLN
2.0000 g | Freq: Two times a day (BID) | INTRAVENOUS | Status: DC
  Administered 2023-03-15 – 2023-03-16 (×4): 2 g via INTRAVENOUS
  Filled 2023-03-15 (×5): qty 100

## 2023-03-15 MED ORDER — MELATONIN 3 MG PO TABS: 3.0000 mg | ORAL_TABLET | Freq: Every evening | ORAL | Status: DC | PRN

## 2023-03-15 MED ORDER — LEVOTHYROXINE SODIUM 88 MCG PO TABS
88.0000 ug | ORAL_TABLET | Freq: Every day | ORAL | Status: DC
  Administered 2023-03-15 – 2023-03-17 (×3): 88 ug via ORAL
  Filled 2023-03-15 (×3): qty 1

## 2023-03-15 MED ORDER — ONDANSETRON HCL 4 MG/2ML IJ SOLN: 4.0000 mg | Freq: Four times a day (QID) | INTRAMUSCULAR | Status: DC | PRN

## 2023-03-15 NOTE — ED Provider Notes (Signed)
  Physical Exam  BP (!) 104/51   Pulse 92   Temp (!) 97.5 F (36.4 C)   Resp 20   SpO2 93%     Procedures  Procedures  ED Course / MDM    Medical Decision Making Amount and/or Complexity of Data Reviewed Labs: ordered.  Risk Prescription drug management. Decision regarding hospitalization.   Received patient in signout from Dr. Wallace Cullens.  Patient is hemodynamically stable on my assessment.  Signout pending admission.  Discussed with Dr. Haroldine Laws plans for admission.       Ernie Avena, MD 03/15/23 [phone]

## 2023-03-15 NOTE — TOC Initial Note (Signed)
 Transition of Care [facility]) - Initial/Assessment Note    Patient Details  Name: Sharon Bailey MRN: [id number] Date of Birth: 1930-01-20  [facility] of [facility]) CM/SW Contact:    Amada Jupiter, LCSW Phone Number: 03/15/2023, 2:35 PM  Clinical Narrative:                  Met with pt and daughter, Sedalia Muta, today to discuss anticipated dc needs.  Pt familiar to CSW from recent hospitalization/ SNF dc last month.  Both pt and daughter confirming pt had been home ~ 1 week from SNF and doing well overall.  Family sharing in providing pt with 24/7 care in pt's home and they fully plan to have pt dc to home once medically cleared. Daughter notes they were receiving HHRN and PT visits from [facility] and would like to continue.  At this point, CSW will follow along should any new needs arise.  Expected Discharge Plan: Home w Home Health Services Barriers to Discharge: Continued Medical Work up   Patient Goals and CMS Choice Patient states their goals for this hospitalization and ongoing recovery are:: return home with family          Expected Discharge Plan and Services In-house Referral: Clinical Social Work   Post Acute Care Choice: Home Health Living arrangements for the past 2 months: Single Family Home Expected Discharge Date: 03/19/23               DME Arranged: N/A DME Agency: NA                  Prior Living Arrangements/Services Living arrangements for the past 2 months: Single Family Home Lives with:: Adult Children Patient language and need for interpreter reviewed:: Yes Do you feel safe going back to the place where you live?: Yes      Need for Family Participation in Patient Care: Yes (Comment) Care giver support system in place?: Yes (comment) Current home services: Home PT, Home RN Criminal Activity/Legal Involvement Pertinent to Current Situation/Hospitalization: No - Comment as needed  Activities of Daily Living   ADL Screening (condition at time of  admission) Independently performs ADLs?: Yes (appropriate for developmental age) Does the patient have a NEW difficulty with bathing/dressing/toileting/self-feeding that is expected to last >3 days?: No Does the patient have a NEW difficulty with getting in/out of bed, walking, or climbing stairs that is expected to last >3 days?: No Does the patient have a NEW difficulty with communication that is expected to last >3 days?: No Is the patient deaf or have difficulty hearing?: Yes Does the patient have difficulty seeing, even when wearing glasses/contacts?: No Does the patient have difficulty concentrating, remembering, or making decisions?: No  Permission Sought/Granted Permission sought to share information with : Family Supports Permission granted to share information with : Yes, Verbal Permission Granted  Share Information with NAME: daughter, Frederico Hamman @ [phone]           Emotional Assessment Appearance:: Appears stated age Attitude/Demeanor/Rapport: Gracious, Engaged Affect (typically observed): Accepting, Pleasant Orientation: : Oriented to Self, Oriented to Place, Oriented to  Time, Oriented to Situation Alcohol / Substance Use: Not Applicable Psych Involvement: No (comment)  Admission diagnosis:  Cellulitis of left lower extremity [L03.116] Cellulitis of both feet [L03.115, L03.116] Patient Active Problem List   Diagnosis Date Noted   Cellulitis of both feet 03/15/2023   Cellulitis of left lower extremity 03/15/2023   Acute hyponatremia 03/15/2023   Chronic hypoxic respiratory failure (HCC)  03/15/2023   Chronic diastolic CHF (congestive heart failure) (HCC) 03/15/2023   Mild aortic stenosis 03/15/2023   Mild intermittent asthma 03/15/2023   History of anemia due to chronic kidney disease 03/15/2023   Acute deep vein thrombosis (DVT) of right upper extremity (HCC) 02/23/2023   Anasarca 02/23/2023   Cellulitis 02/23/2023   Protein calorie malnutrition (HCC)  02/23/2023   Closed comminuted intertrochanteric fracture of left femur, initial encounter (HCC) 02/05/2023   Collagenous colitis 11/06/2022   Frail elderly 03/04/2022   Anemia 02/21/2022   Lymphocytic colitis 02/17/2022   Bilateral impacted cerumen 02/27/2021   Allergic rhinitis 06/18/2020   Cerebrovascular disease 06/18/2020   Chronic kidney disease, stage 3 unspecified (HCC) 06/18/2020   Acquired hypothyroidism 06/18/2020   Increased frequency of urination 06/18/2020   Senile purpura (HCC) 06/18/2020   Venous insufficiency of both lower extremities 01/11/2020   History of CVA (cerebrovascular accident) 09/06/2019   Nausea and vomiting 06/11/2019   Acute renal failure superimposed on stage 3b chronic kidney disease (HCC) 06/11/2019   Asymmetrical hearing loss of left ear 10/31/2018   Obstructive sleep apnea 10/31/2018   Sensorineural hearing loss (SNHL) of both ears 10/31/2018   Low back pain 09/21/2017   Chronic fatigue 02/05/2017   Atypical chest pain 02/05/2017   HLD (hyperlipidemia) 02/05/2017   Palpitations 04/18/2016   Essential hypertension 04/18/2016   Hypertensive heart disease without heart failure 04/18/2016   Status post foot surgery 10/11/2012   Gout attack 09/28/2012   Hammer toe of left foot 09/28/2012   Pain in joint, ankle and foot 09/28/2012   PCP:  Irven Coe, MD Pharmacy:   [id number] - [place], [place] - [address] AT [facility] OF [address] [place] [address] Phone: [phone] Fax: [phone]     Social Drivers of Health (SDOH) Social History: SDOH Screenings   Food Insecurity: No Food Insecurity (03/15/2023)  Housing: Low Risk  (03/15/2023)  Transportation Needs: No Transportation Needs (03/15/2023)  Utilities: Not At Risk (03/15/2023)  Depression (PHQ2-9): Low Risk  (02/21/2022)  Social Connections: Moderately Integrated (03/15/2023)  Tobacco Use: Low Risk  (03/15/2023)   SDOH  Interventions:     Readmission Risk Interventions    03/15/2023    1:03 PM  Readmission Risk Prevention Plan  Transportation Screening Complete  PCP or Specialist Appt within 3-5 Days Complete  HRI or Home Care Consult Complete  Social Work Consult for Recovery Care Planning/Counseling Complete  Palliative Care Screening Not Applicable  Medication Review Oceanographer) Complete

## 2023-03-15 NOTE — Progress Notes (Signed)
*  PRELIMINARY RESULTS* Echocardiogram 2D Echocardiogram has been performed.  Sharon Bailey Sharon Bailey 03/15/2023, 1:19 PM

## 2023-03-15 NOTE — H&P (Signed)
 History and Physical      Sharon Bailey BMW:[id number] DOB: Apr 23, 1930 DOA: 03/14/2023; DOS: 03/15/2023  PCP: Irven Coe, MD  Patient coming from: home   I have personally briefly reviewed patient's old medical records in [facility]  Chief Complaint: Left foot erythema  HPI: Sharon Bailey is a 89 y.o. female with medical history significant for chronic hypoxic respiratory failure on 2 L continuous nasal cannula, mild intermittent asthma, essential hypertension, hyperlipidemia, acquired hypothyroidism, chronic diastolic heart failure, mild aortic stenosis, stage IIIb CKD associated baseline creatinine 1.2-1.5, anemia of chronic disease associated baseline hemoglobin range 8-12, who is admitted to [facility] on 03/14/2023 by way of transfer from [facility] with cellulitis of the left lower extremity after presenting from home to [facility] complaining of left foot erythema.  The patient has a recent history of left intertrochanteric hip fracture status post IM nail on 02/06/2023.  Over the last 6 weeks, she has noted persistent swelling involving the bilateral lower extremities  Subsequently, she presented to the [facility] earlier this evening complaining of new onset left lower extremity erythema over the dorsum of the left foot over the course of the last 2-3 days.  This has been associated with increased warmth, tenderness, as well as subjective fever.  Not associate with any drainage.  In this context, she was evaluated by her PCP 2 days ago, and diagnosed with left foot cellulitis at that time, prompting prescription for Keflex.  After taking Keflex for nearly 2 days, she notes no significant improvement in the left foot erythema, increased warmth, tenderness, prompting her to present to [facility] this evening for further evaluation and management thereof.  Aside from her recent left intertrochanteric hip fracture, she denies any recent trauma to the left lower extremity,  including no recent trauma to the left foot.  She denies any associated acute focal weakness or any acute focal sensory deficits involving the left lower extremity.  Denies any recent calf tenderness.   Regarding her bilateral lower extremity erythema over the last several weeks, she has a documented history of chronic diastolic heart failure as well as mild aortic stenosis.  Most recent echocardiogram occurred in June 2021, and was notable for LVEF 65 to 70%, no focal Measurin maladies, severe LVH, grade 1 diastolic dysfunction, normal right ventricular systolic function, trivial mitral gravitation, trivial aortic regurgitation and mild aortic stenosis with aortic valve area of 1.67 cm2 calculated via continuity equation using VTI.  Not on a diuretic medications as an outpatient.  Denies any recent shortness of breath or any recent chest pain.     [facility] ED Course:  Vital signs in the ED were notable for the following: Afebrile initial heart rates in the low 100s, subsequently decreasing into the 70s to 80s following initiation of IV fluids, as further quantified below; systolic blood pressures in the 90s to 120s; respiratory rate 16-22, oxygen saturation 96 to 100% on her baseline 2 L continuous nasal cannula.  Labs were notable for the following: CMP was notable for the following: Sodium 133, relative to most recent prior serum sodium data point of 139 on 02/24/2023, potassium 4.0, BUN 40 compared to 60 on 02/24/2023, creatinine 1.84 relative to 1.19 on 02/24/2023, BUN to current ratio greater than 20, calcium adjusted for mild hypoalbuminemia noted to be 9.3, avidin 2.8.  Otherwise, liver enzymes were within normal limits.  BNP 128 compared to most recent prior value of 121.9 on 02/23/2023.  Lactic acid 1.6.  CBC notable for  white cell count 9200, hemoglobin 8.7 associated Neuraceq/Norocarp properties and relative to most recent prior hemoglobin value of 9.9 on 02/24/2023, platelet count 235.  INR 1.2.   Blood cultures x 2 were collected prior to initiation of IV antibiotics.  Per my interpretation, EKG in ED demonstrated the following: No EKG performed today  Imaging in the ED, per corresponding formal radiology read, was notable for the following: No imaging performed today  While in the ED, the following were administered: Cefazolin x 1 dose, normal saline at 75 cc/h.  Subsequently, the patient was admitted to [facility] long for further evaluation management of cellulitis of the left foot, with presenting labs also notable for acute kidney injury superimposed on CKD 3B as well as acute hyponatremia.    Review of Systems: As per HPI otherwise 10 point review of systems negative.   Past Medical History:  Diagnosis Date   Abnormal EKG    Acute CVA (cerebrovascular accident) (HCC) 06/11/2019   AKI (acute kidney injury) (HCC) 06/11/2019   Collagenous colitis    Collagenous colitis    Diverticulitis    Diverticulosis    Fluttering heart    GERD (gastroesophageal reflux disease)    Gout    Gout attack 09/28/2012   Hammer toe of left foot 09/28/2012   Hyperlipidemia    Hypertension    Hypertensive emergency    Hypothyroidism    Irregular heart beat    Pain in joint, ankle and foot 09/28/2012   Status post foot surgery 10/11/2012   Thyroid disease     Past Surgical History:  Procedure Laterality Date   ABDOMINAL HYSTERECTOMY     ADENOIDECTOMY     CHOLECYSTECTOMY     Excision Ganglion Toe Left 09/29/2012   Lt #2 @ [facility]   Hammer toe repair Left 09/29/2012   Lt #2 @ [facility]   INTRAMEDULLARY (IM) NAIL INTERTROCHANTERIC Left 02/06/2023   Procedure: INTRAMEDULLARY (IM) NAIL INTERTROCHANTERIC;  Surgeon: Luci Bank, MD;  Location: [facility] OR;  Service: Orthopedics;  Laterality: Left;   JOINT REPLACEMENT Left    Knee   TONSILLECTOMY      Social History:  reports that she has never smoked. She has never used smokeless tobacco. She reports that she does not drink alcohol and does not use  drugs.   Allergies  Allergen Reactions   Bactrim [Sulfamethoxazole-Trimethoprim] Diarrhea and Nausea Only   Cipro [Ciprofloxacin Hcl] Hives    Family History  Problem Relation Age of Onset   Breast cancer Mother    Memory loss Father    Heart disease Sister    Rheumatic fever Sister    Lung disease Brother    Colon cancer Neg Hx     Family history reviewed and not pertinent    Prior to Admission medications   Medication Sig Start Date End Date Taking? Authorizing Provider  acetaminophen (TYLENOL) 325 MG tablet Take 650 mg by mouth 2 (two) times daily as needed (for pain).    [provider]  acetaminophen (TYLENOL) 500 MG tablet Take 2 tablets (1,000 mg total) by mouth every 8 (eight) hours as needed for mild pain (pain score 1-3). Patient taking differently: Take 1,000 mg by mouth 3 (three) times daily. 02/10/23   Rai, Delene Ruffini, MD  allopurinol (ZYLOPRIM) 300 MG tablet Take 0.5 tablets (150 mg total) by mouth daily. 02/10/23   Rai, Ripudeep K, MD  amLODipine (NORVASC) 5 MG tablet TAKE 1 TABLET (5 MG TOTAL) BY MOUTH DAILY. 10/22/22   Cristal Deer,  Bridgette, MD  aspirin EC 81 MG tablet Take 81 mg by mouth daily. Swallow whole.    [provider]  budesonide (ENTOCORT EC) 3 MG 24 hr capsule TAKE 1 CAPSULE EVERY DAY AS DIRECTED Patient taking differently: Take 3 mg by mouth daily. 03/04/22   Hilarie Fredrickson, MD  ferrous sulfate 325 (65 FE) MG EC tablet Take 325 mg by mouth daily with breakfast.    [provider]  levothyroxine (SYNTHROID) 88 MCG tablet Take 88 mcg by mouth in the morning.    [provider]  OXYGEN Inhale 2 L/min into the lungs as needed (for shortness of breath or chest pain).    [provider]  polyethylene glycol (MIRALAX / GLYCOLAX) 17 g packet Take 17 g by mouth daily as needed for moderate constipation. Patient taking differently: Take 17 g by mouth daily as needed (for constipation). 02/10/23   Rai, Delene Ruffini, MD   simvastatin (ZOCOR) 40 MG tablet Take 40 mg by mouth every evening.    [provider]  traMADol (ULTRAM) 50 MG tablet Take 1 tablet (50 mg total) by mouth every 8 (eight) hours as needed for severe pain (pain score 7-10). 02/10/23   Rai, Ripudeep K, MD  vitamin B-12 1000 MCG tablet Take 1 tablet (1,000 mcg total) by mouth daily. 06/14/19   Joseph Art, DO  ZYRTEC ALLERGY 10 MG tablet Take 10 mg by mouth at bedtime. 02/17/23 02/24/23  [provider]     Objective    Physical Exam: Vitals:   03/15/23 0100 03/15/23 0130 03/15/23 0200 03/15/23 0233  BP: (!) 103/42 (!) 102/46 (!) 104/46 (!) 109/56  Pulse: 81 79 79 81  Resp:   16 16  Temp:    (!) 97.5 F (36.4 C)  TempSrc:    Oral  SpO2: 100% 100% 100% 100%  Weight: 64 kg       General: appears to be stated age; alert; very pleasant Skin: warm, dry, manipulation of guaze to reveal erythema over the dorsal surface of the left foot associated increased warmth, tenderness, swelling, in the absence of associated drainage Head:  AT/ Mouth:  Oral mucosa membranes appear dry, normal dentition Neck: supple; trachea midline Heart:  RRR; did not appreciate any M/R/G Lungs: CTAB, did not appreciate any wheezes, rales, or rhonchi Abdomen: + BS; soft, ND, NT Vascular: 2+ pedal pulses b/l; 2+ radial pulses b/l Extremities: Trace edema in the right lower extremity ; erythema, increased warmth to the touch, swelling, tenderness over the dorsal surface of the left foot, as further detailed above Neuro: strength and sensation intact in upper and lower extremities b/l     Labs on Admission: I have personally reviewed following labs and imaging studies  CBC: Recent Labs  Lab 03/14/23 2112  WBC 9.2  NEUTROABS 7.0  HGB 8.7*  HCT 27.7*  MCV 91.4  PLT 235   Basic Metabolic Panel: Recent Labs  Lab 03/14/23 2112  NA 133*  K 4.0  CL 103  CO2 21*  GLUCOSE 114*  BUN 40*  CREATININE 1.84*  CALCIUM 8.3*    GFR: Estimated Creatinine Clearance: 17.5 mL/min (A) (by C-G formula based on SCr of 1.84 mg/dL (H)). Liver Function Tests: Recent Labs  Lab 03/14/23 2112  AST 14*  ALT 13  ALKPHOS 122  BILITOT 0.9  PROT 5.7*  ALBUMIN 2.8*   No results for input(s): "LIPASE", "AMYLASE" in the last 168 hours. No results for input(s): "AMMONIA" in the last  168 hours. Coagulation Profile: Recent Labs  Lab 03/14/23 2112  INR 1.2   Cardiac Enzymes: No results for input(s): "CKTOTAL", "CKMB", "CKMBINDEX", "TROPONINI" in the last 168 hours. BNP (last 3 results) No results for input(s): "PROBNP" in the last 8760 hours. HbA1C: No results for input(s): "HGBA1C" in the last 72 hours. CBG: No results for input(s): "GLUCAP" in the last 168 hours. Lipid Profile: No results for input(s): "CHOL", "HDL", "LDLCALC", "TRIG", "CHOLHDL", "LDLDIRECT" in the last 72 hours. Thyroid Function Tests: No results for input(s): "TSH", "T4TOTAL", "FREET4", "T3FREE", "THYROIDAB" in the last 72 hours. Anemia Panel: No results for input(s): "VITAMINB12", "FOLATE", "FERRITIN", "TIBC", "IRON", "RETICCTPCT" in the last 72 hours. Urine analysis:    Component Value Date/Time   COLORURINE STRAW (A) 02/24/2023 0051   APPEARANCEUR CLEAR 02/24/2023 0051   LABSPEC 1.012 02/24/2023 0051   PHURINE 5.0 02/24/2023 0051   GLUCOSEU NEGATIVE 02/24/2023 0051   HGBUR NEGATIVE 02/24/2023 0051   BILIRUBINUR NEGATIVE 02/24/2023 0051   KETONESUR NEGATIVE 02/24/2023 0051   PROTEINUR NEGATIVE 02/24/2023 0051   UROBILINOGEN 0.2 10/07/2014 1830   NITRITE NEGATIVE 02/24/2023 0051   LEUKOCYTESUR NEGATIVE 02/24/2023 0051    Radiological Exams on Admission: No results found.    Assessment/Plan    Principal Problem:   Cellulitis of left lower extremity Active Problems:   Essential hypertension   HLD (hyperlipidemia)   Acute renal failure superimposed on stage 3b chronic kidney disease (HCC)   Allergic rhinitis   Acquired  hypothyroidism   Acute hyponatremia   Chronic hypoxic respiratory failure (HCC)   Chronic diastolic CHF (congestive heart failure) (HCC)   Mild aortic stenosis   Mild intermittent asthma   History of anemia due to chronic kidney disease     #) Cellulitis of the left lower extremity: Diagnosis on the basis of 2 to 3 days of progressive left foot erythema, increased warmth, tenderness, swelling associated with subjective fever.  No evidence of crepitus to suggest necrotizing fasciitis at this time.  Will pursue plain films of the left foot to further evaluate for any subcu gas or acute fracture.  Acute osteomyelitis appears less likely at this time, and there are no open wounds, nor does the patient carry a underlying diagnosis of diabetes.   Risk factors for development of her cellulitis include relative venous stasis over the last several weeks, as described above, compounded by relative decline in ambulatory status in the setting of her postop status from recent left intertrochanteric hip fracture, as above.  She does carry a diagnosis of chronic diastolic heart failure as well as mild aortic stenosis, with most recent echocardiogram occurring in June 2021.  Will pursue updated echocardiogram to evaluate for any interval worsening in heart function that may have contributed to the development of peripheral edema over the last several weeks.  Distribution of patient's erythema, swelling, presents suggestive of DVT, which is notable given recent surgical history involving the left lower extremity.  In the absence of objective fever and in the absence of leukocytosis, SIRS criteria for sepsis are not currently met.  Lactic acid was nonelevated, as above.  Blood cultures x 2 were collected at [facility] earlier today prior to initiation of cefazolin, which will be continued for now.   Plan: Monitor for results of blood cultures x 2.  Continue cefazolin.  Check ESR, CRP.  Plain films of the left foot,  as above.  5 placed nursing communication order requesting elevation of the lower extremities to promote gravity assisted drainage.  Echocardiogram in the morning.  Full precautions ordered.  Prn acetaminophen for pain.                 #) Acute Kidney Injury superimposed on CKD 3B:   Relative to the patient's documented history of CKD 3B with baseline creatinine range 1.2-1.5, her presenting creatinine is noted to be 1.84 compared to most recent prior value of 1.19 on 02/24/2023.  Associated with acute prerenal azotemia, and suspected to represent prerenal source, including suspicion for intravascular depletion, including from ostensible losses in the setting of acute infection in the form of left lower extremity cellulitis.  Less likely to represent glomerulonephritis in setting GAS, but could consider checking ASO titer and anti-DNase B ab's and worsening of renal function in spite of interval IV fluids.  In terms of other potential prerenal etiologies, decline in renal perfusion gradient possibility, but less likely at this time.  Follow-up for result of echocardiogram, and closely monitor for ensuing volume status, as outlined below.  Plan: monitor strict I's & O's and daily weights. Attempt to avoid nephrotoxic agents. Refrain from NSAIDs. Repeat CMP in the morning. Check serum magnesium level.  Check urinalysis with microscopy.  Add-on random urine sodium and random urine creatinine.  Add on CPK in the context of outpatient statin use and recent decline in ambulatory status.  Will continue existing order for normal saline at 75 cc/h.  Follow-up for result of echocardiogram, as above.                   #) Acute hypo-osmolar hyponatremia: Presenting serum sodium level 133 compared to most recent prior value 139 on 02/24/2023.  Differential includes potential hypokalemia ostensible losses from presenting versus contribution from postoperative SIADH.  Differential also includes  possibility of volume overload, although, started above, this appears less likely at the present, but warrants further evaluation, as further detailed below.  Will continue with gentle and expand further workup, including associated assessment for SIADH, as below.  Plan: monitor strict I's and O's and daily weights.  check UA, random urine sodium, urine osmolality.  Check serum osmolality to confirm suspected hypoosmolar etiology.  Repeat CMP in the morning. Gentle IVF's, as above. Check serum uric acid level, as SIADH can be associated with hypouricemia due to hyperuricuria, although her outpatient but her is notable.  Follow for result of echocardiogram.                      #) Mild intermittent asthma: documented history thereof, without clinical e/o to suggest current exacerbation.   Plan: Check serum magnesium level.  Prn albuterol nebulizer.Marland Kitchen                      #) Essential Hypertension: documented h/o such, with outpatient antihypertensive regimen including amlodipine, which is notable given potential contribution thereof towards her subacute edema of the bilateral lower extremities.  SBP's in the ED today: 90s to 120s mmHg. given these presenting blood pressures as well as presenting acute infectious process, will hold home amlodipine for now.  Plan: Close monitoring of subsequent BP via routine VS. hold home amlodipine for now.                     #) Hyperlipidemia: documented h/o such. On simvastatin as outpatient.   Plan: continue home statin.  Follow for result of CPK level.                     #)  acquired hypothyroidism: documented h/o such, on Synthroid as outpatient.   Plan: cont home Synthroid.                        #) Allergic Rhinitis: documented h/o such, on scheduled Zyrtec in the absence of intranasal corticosteroid as outpatient.    Plan: cont home Zyrtec.                         #) Chronic diastolic heart failure: documented history of such, with most recent echocardiogram performed in June 2021, which was notable for LVEF 65 to 70%, grade 1 diastolic dysfunction mild aortic stenosis, as further detailed above.  Is also noted that her echo in June 2021 was associate with severe LVH, offering a prognostic indicator for ensuing worsening of heart failure, which appears notable given her subacute edema in the bilateral extremities.  Will pursue updated echocardiogram to further evaluate.  Not currently on any diuretic medications at home.  BNP is stable relative to most recent prior value from 02/23/2023.   Plan: monitor strict I's & O's and daily weights. Repeat CMP in AM. Check serum mag level.  Echocardiogram in the morning.                     #) Mild aortic stenosis: Documented history of such with most recent echocardiogram in June 2021 a/w aortic valve area of 1.67 cm2 calculated via continuity equation using VTI.  Echocardiogram ordinarily 4 years ago, with increased risk for interval worsening of her aortic stenosis, now with evidence of bilateral lower natremia, will pursue updated echocardiogram.   Plan: monitor strict I's and O's and daily weights.  Echocardiogram in the morning.                  #) Anemia of chronic kidney disease: Documented history of such, a/w with baseline hgb range 8-12, with presenting hgb consistent with this range, in the absence of any overt evidence of active bleed.  INR 1.2.   Plan: Repeat CBC in the morning.                   #) Chronic hypoxic respiratory failure documented history of such, on 2 L continuous nasal cannula at baseline, with oxygen saturation in the high 90s to 100% on her baseline 2 L nasal cannula.  This appears to be in the context of multifactorial underlying pathologies, including her documented history of chronic diastolic  heart failure, mild aortic stenosis.   Plan: Further evaluation management of chronic diastolic heart failure as well as mild aortic stenosis.  Monitor strict I's and O's and daily weights.  Prn albuterol nebulizer.               DVT prophylaxis: SCD's   Code Status: DNR, c/w code status documentation from most recent prior hospitalization Family Communication: none Disposition Plan: Per Rounding Team Consults called: none;  Admission status: Inpatient    I SPENT GREATER THAN 75  MINUTES IN CLINICAL CARE TIME/MEDICAL DECISION-MAKING IN COMPLETING THIS ADMISSION.     Chaney Born Melynda Krzywicki DO [facility] From 7PM - 7AM   03/15/2023, 2:54 AM

## 2023-03-15 NOTE — Progress Notes (Signed)
 PROGRESS NOTE    Sharon Bailey  ZOX:[id number] DOB: [birth year] DOA: 03/14/2023 PCP: Irven Coe, MD    Brief Narrative:   Sharon Bailey is a 89 y.o. female with past medical history significant for chronic hypoxic respiratory failure on 2L [place], mild intermittent asthma, HTN, HLD, hypothyroidism, chronic diastolic congestive heart failure, mild aortic stenosis, CKD stage IIIb (Cr 1.2-1.5 baseline), anemia of chronic medical disease (Hgb 8-12), who presented to [facility] ED on 03/14/2023 from home with complaints of lower extremity edema, weeping, blisters and erythema.  Onset of erythema dorsum left foot over the last 2-3 days with warmth, tenderness and subjective fever.  Was seen by PCP 2 days prior, diagnosed with left foot cellulitis and started on prescription for Keflex.  Has been taking over the last 2 days with no significant improvement which prompted her to seek further care in the ED.  Denies any recent trauma to left foot, no sensory deficits and no calf tenderness.  In the ED, temperature 97.5 F, HR 105, RR 22, BP 92/59, SpO2 100% on room air.  WBC 9.2, hemoglobin 8.7, platelet count 235.  Sodium 133, potassium 4.0, chloride 103, CO2 21, glucose 114, BUN 40, creatinine 1.84.  AST 14, ALT 13, total open 0.9.  Lactic acid 1.6.  BNP 127.9.  INR 1.2.  Patient was given 1 dose of IV cefazolin, started on normal saline at 75 mL/h.  TRH consulted for admission patient was transferred to [facility] for further evaluation and management.  Assessment & Plan:   Left foot cellulitis with blisters Patient presenting to ED with progressive lower extremity edema, erythema, pain to left foot.  Recently prescribed Keflex outpatient with no improvement over the last 2 days.  Patient is afebrile without leukocytosis. -- Left foot x-ray: Pending -- Ancef 2 g IV every 12 hours -- Cleanse bilateral lower leg and feet wounds (including intact blister) with Vashe wound cleanser Hart [place]  [phone]), do not rinse and allow to air dry. Apply Xeroform to blisters and wound beds daily. Cover with ABD pad and secure with Kerlix roll gauze beginning right above toes and ending right below knees. May apply Ace bandage wrapped in same fashion as Kerlix to provide light compression.   Hyponatremia: Resolved Etiology likely secondary to hypovolemic hyponatremia in the setting of poor oral intake in the days preceding hospitalization.  Sodium 133 on admission. -- Na 133>136 -- continue NS at 75 mL/h -- repeat BMP in the am  Acute renal failure on CKD stage IIIb Baseline creatinine 1.2-1.5.  Creatinine elevated on admission to 1.84.  Etiology likely secondary to prerenal azotemia in the setting of dehydration versus ATN given she had some mild hypotension on admission. -- Cr 1.84>1.61 -- Continue IV fluid hydration as above -- Avoid nephrotoxins, renal dose all medications -- Repeat BMP in a.m.  Recent left hip fracture s/p IMN Outpatient follow-up with orthopedics, Dr. Hulda Humphrey.   Chronic hypoxic respiratory failure on 2L [place] at baseline Mild intermittent asthma -- Albuterol neb every 4 hours.  Wheezing/shortness of breath  HTN Chronic diastolic congestive heart failure, compensated Patient with hypotension on admission, BP 192/59.  Home regimen includes Norvasc 5 mg p.o. daily -- Hold home Norvasc -- IV fluid hydration as above -- Continue BP closely  Hypothyroidism -- Levothyroxine 88 mcg p.o. daily  Hx collagenous colitis Follow-up gastroenterology outpatient, Dr. Marina Goodell. -- Continue budesonide 3 mg p.o. daily  Hx Gout -- Allopurinol 150 mg p.o. daily  Hyperlipidemia -- Simvastatin 40  mg p.o. daily  Anemia of chronic medical disease Based on hemoglobin 8-12. -- hgb 8.4>7.7 -- Check anemia panel -- Transfuse for hemoglobin less than 7.0 -- CBC in the am  Pressure injury, stage II buttock; POA -- seen by wound RN -- Apply silicone foam as per skin care order set for  Stage 2 Pressure Injuries buttocks. Lift foam daily to assess. Reconsult WOC team if any necrotic tissue (yellow/brown/black) develops.    DVT prophylaxis: SCDs Start: 03/15/23 0253    Code Status: Limited: Do not attempt resuscitation (DNR) -DNR-LIMITED -Do Not Intubate/DNI  Family Communication: Updated daughter present at bedside this morning  Disposition Plan:  Level of care: Med-Surg Status is: Inpatient Remains inpatient appropriate because: IV antibiotics    Consultants:  None  Procedures:  None  Antimicrobials:  Ancef 3/9>>   Subjective: The patient seen examined bedside, lying in bed.  Daughter present at bedside.  Remains on IV antibiotics.  Continues with swelling of her lower extremities, dressings in place.  Discussed maintaining elevation of her lower extremities to assist with fluid mobilization and edema.  Daughter looking into buying compression wraps for outpatient use.  Patient denies headache, no dizziness, no chest pain, no palpitations, no shortness of breath, no abdominal pain, no fever/chills/night sweats, no nausea cefonicid diarrhea.  No acute events overnight per nursing.  Objective: Vitals:   03/15/23 0233 03/15/23 0506 03/15/23 0931 03/15/23 0947  BP: (!) 109/56 100/71 (!) 120/57   Pulse: 81 77 82   Resp: 16 15 18    Temp: (!) 97.5 F (36.4 C) (!) 97.4 F (36.3 C)    TempSrc: Oral Oral    SpO2: 100% 100% 99%   Weight:    64 kg  Height:    5\' 4"  (1.626 m)    Intake/Output Summary (Last 24 hours) at 03/15/2023 1205 Last data filed at 03/15/2023 1000 Gross per 24 hour  Intake 300 ml  Output 0 ml  Net 300 ml   Filed Weights   03/15/23 0100 03/15/23 0947  Weight: 64 kg 64 kg    Examination:  Physical Exam: GEN: NAD, alert and oriented x 3, chronically ill/elderly in appearance HEENT: NCAT, PERRL, EOMI, sclera clear, MMM PULM: CTAB w/o wheezes/crackles, normal respiratory effort, on 2L [place] which is her baseline CV: RRR w/o M/G/R GI: abd  soft, NTND, + BS MSK: 1-2+ bilateral lower extremity peripheral edema, moves all extremities independently NEURO: No focal neurological deficit PSYCH: normal mood/affect Integumentary: Bilateral lower extremities with erythema, greatest on left edema as depicted below.  Otherwise no other concerning rashes/lesions/wounds noted on exposed skin surfaces            Data Reviewed: I have personally reviewed following labs and imaging studies  CBC: Recent Labs  Lab 03/14/23 2112 03/15/23 0404  WBC 9.2 7.2  NEUTROABS 7.0 5.8  HGB 8.7* 7.7*  HCT 27.7* 25.1*  MCV 91.4 94.7  PLT 235 203   Basic Metabolic Panel: Recent Labs  Lab 03/14/23 2112 03/15/23 0404  NA 133* 136  K 4.0 3.9  CL 103 107  CO2 21* 21*  GLUCOSE 114* 124*  BUN 40* 41*  CREATININE 1.84* 1.61*  CALCIUM 8.3* 7.8*  MG  --  1.4*   GFR: Estimated Creatinine Clearance: 20.1 mL/min (A) (by C-G formula based on SCr of 1.61 mg/dL (H)). Liver Function Tests: Recent Labs  Lab 03/14/23 2112 03/15/23 0404  AST 14* 16  ALT 13 12  ALKPHOS 122 95  BILITOT 0.9 0.9  PROT 5.7* 4.8*  ALBUMIN 2.8* 1.8*   No results for input(s): "LIPASE", "AMYLASE" in the last 168 hours. No results for input(s): "AMMONIA" in the last 168 hours. Coagulation Profile: Recent Labs  Lab 03/14/23 2112  INR 1.2   Cardiac Enzymes: Recent Labs  Lab 03/15/23 0404  CKTOTAL 19*   BNP (last 3 results) No results for input(s): "PROBNP" in the last 8760 hours. HbA1C: No results for input(s): "HGBA1C" in the last 72 hours. CBG: No results for input(s): "GLUCAP" in the last 168 hours. Lipid Profile: No results for input(s): "CHOL", "HDL", "LDLCALC", "TRIG", "CHOLHDL", "LDLDIRECT" in the last 72 hours. Thyroid Function Tests: No results for input(s): "TSH", "T4TOTAL", "FREET4", "T3FREE", "THYROIDAB" in the last 72 hours. Anemia Panel: No results for input(s): "VITAMINB12", "FOLATE", "FERRITIN", "TIBC", "IRON", "RETICCTPCT" in the  last 72 hours. Sepsis Labs: Recent Labs  Lab 03/14/23 2115  LATICACIDVEN 1.6    Recent Results (from the past 240 hours)  Culture, blood (Routine x 2)     Status: None (Preliminary result)   Collection Time: 03/15/23  4:04 AM   Specimen: BLOOD RIGHT ARM  Result Value Ref Range Status   Specimen Description   Final    BLOOD RIGHT ARM Performed at [facility], [address] [place], [place] [address]    Special Requests   Final    BOTTLES DRAWN AEROBIC AND ANAEROBIC Blood Culture results may not be optimal due to an inadequate volume of blood received in culture bottles Performed at [facility], [address] [place], [place] [address]    Culture PENDING  Incomplete   Report Status PENDING  Incomplete         Radiology Studies: No results found.      Scheduled Meds:  allopurinol  150 mg Oral Daily   aspirin EC  81 mg Oral Daily   ferrous sulfate  325 mg Oral Q breakfast   levothyroxine  88 mcg Oral Q0600   loratadine  10 mg Oral Daily   simvastatin  40 mg Oral QPM   Continuous Infusions:  sodium chloride 75 mL/hr at 03/15/23 0310    ceFAZolin (ANCEF) IV       LOS: 0 days    Time spent: 48 minutes spent on 03/15/2023 caring for this patient face-to-face including chart review, ordering labs/tests, documenting, discussion with nursing staff, consultants, updating family and interview/physical exam    Alvira Philips [place], DO [facility] Available via Epic secure chat 7am-7pm After these hours, please refer to coverage provider listed on [email or web address] 03/15/2023, 12:05 PM

## 2023-03-15 NOTE — Progress Notes (Signed)
 Spoke with [employer]omatic Services operational officer for ordering of wound care supplies: Vashe wound cleanser Hart [place] [phone]) and Xeroform gauze. Wound care to be performed when supplies available. Haydee Salter, RN 03/15/23 10:22 AM

## 2023-03-15 NOTE — Consult Note (Signed)
 WOC Nurse Consult Note: patient being admitted for L lower extremity cellulitis  Reason for Consult: stage 2 buttocks, blisters L foot  Wound type:  1.  Full thickness serum filled blister to R foot largely intact, partially open serum filled blister L dorsal foot 2. Full thickness L anterior leg  3.  Stage 2 Pressure Injuries to buttocks  Pressure Injury POA: Yes Measurement: see nursing flowsheet  Wound bed: 1.  Serum filled blister R foot, L dorsal foot yellow partially ruptured blister  2.  Red moist buttocks per flowsheet  Drainage (amount, consistency, odor)  see nursing flowsheet  Periwound:erythema, chronic discoloration of B lower legs  Dressing procedure/placement/frequency:  Cleanse bilateral lower leg and feet wounds (including intact blister) with Vashe wound cleanser Hart [place] [phone]), do not rinse and allow to air dry. Apply Xeroform to blisters and wound beds daily.  Cover with ABD pad and secure with Kerlix roll gauze beginning right above toes and ending right below knees.  May apply Ace bandage wrapped in same fashion as Kerlix to provide light compression.   Apply silicone foam as per skin care order set for Stage 2 Pressure Injuries buttocks.  Lift foam daily to assess.  Reconsult WOC team if any necrotic tissue (yellow/brown/black) develops.   POC discussed with bedside nurse. WOC team will not follow. Re-consult if further needs arise.   Thank you,    Priscella Mann MSN, RN-BC, [employer] [phone]

## 2023-03-16 DIAGNOSIS — L03116 Cellulitis of left lower limb: Secondary | ICD-10-CM | POA: Diagnosis not present

## 2023-03-16 LAB — OSMOLALITY, URINE: Osmolality, Ur: 632 mosm/kg (ref 300–900)

## 2023-03-16 LAB — URINALYSIS, COMPLETE (UACMP) WITH MICROSCOPIC
Bilirubin Urine: NEGATIVE
Glucose, UA: NEGATIVE mg/dL
Hgb urine dipstick: NEGATIVE
Ketones, ur: NEGATIVE mg/dL
Nitrite: NEGATIVE
Protein, ur: NEGATIVE mg/dL
Specific Gravity, Urine: 1.024 (ref 1.005–1.030)
pH: 5 (ref 5.0–8.0)

## 2023-03-16 LAB — BASIC METABOLIC PANEL
Anion gap: 4 — ABNORMAL LOW (ref 5–15)
BUN: 32 mg/dL — ABNORMAL HIGH (ref 8–23)
CO2: 20 mmol/L — ABNORMAL LOW (ref 22–32)
Calcium: 7.5 mg/dL — ABNORMAL LOW (ref 8.9–10.3)
Chloride: 111 mmol/L (ref 98–111)
Creatinine, Ser: 1.35 mg/dL — ABNORMAL HIGH (ref 0.44–1.00)
GFR, Estimated: 37 mL/min — ABNORMAL LOW (ref >60)
Glucose, Bld: 85 mg/dL (ref 70–99)
Potassium: 3.9 mmol/L (ref 3.5–5.1)
Sodium: 135 mmol/L (ref 135–145)

## 2023-03-16 LAB — CBC
HCT: 23.4 % — ABNORMAL LOW (ref 36.0–46.0)
Hemoglobin: 6.8 g/dL — CL (ref 12.0–15.0)
MCH: 28.2 pg (ref 26.0–34.0)
MCHC: 29.1 g/dL — ABNORMAL LOW (ref 30.0–36.0)
MCV: 97.1 fL (ref 80.0–100.0)
Platelets: 186 10*3/uL (ref 150–400)
RBC: 2.41 MIL/uL — ABNORMAL LOW (ref 3.87–5.11)
RDW: 17.5 % — ABNORMAL HIGH (ref 11.5–15.5)
WBC: 5.6 10*3/uL (ref 4.0–10.5)
nRBC: 0.4 % — ABNORMAL HIGH (ref 0.0–0.2)

## 2023-03-16 LAB — PREPARE RBC (CROSSMATCH)

## 2023-03-16 LAB — CREATININE, URINE, RANDOM: Creatinine, Urine: 124 mg/dL

## 2023-03-16 LAB — SODIUM, URINE, RANDOM: Sodium, Ur: 60 mmol/L

## 2023-03-16 MED ORDER — SODIUM CHLORIDE 0.9% IV SOLUTION: Freq: Once | INTRAVENOUS | Status: DC

## 2023-03-16 NOTE — Progress Notes (Signed)
+  Date and time results received: 03/16/23 8295   Test: Hgb Critical Value: Hgb-6.8  Name of Provider Notified: Chinita Greenland, NP  Orders Received? Or Actions Taken?: waiting for NP's response.

## 2023-03-16 NOTE — Progress Notes (Signed)
 OT Cancellation Note  Patient Details Name: Sharon Bailey MRN: [id number] DOB: 1930-09-28   Cancelled Treatment:    Reason Eval/Treat Not Completed: Medical issues which prohibited therapy  Pt HgB 6.8 s/p transfusion. OT to follow up as schedule allows and to continue to follow acutely.   Aubriel Khanna OT/L Acute Rehabilitation Department  [phone]   03/16/2023, 3:31 PM

## 2023-03-16 NOTE — Progress Notes (Signed)
 PT Note  Patient Details Name: Sharon Bailey MRN: [id number] DOB: 1930/10/03   Cancelled Treatment:    Reason Eval/Treat Not Completed: Medical issues which prohibited therapy. Pt HgB 6.8 and awaiting transfusion. PT to return as schedule allows and to continue to follow acutely.   [name], PT Acute Rehab   [name] 03/16/2023, 11:27 AM

## 2023-03-16 NOTE — Plan of Care (Signed)
   Problem: Clinical Measurements: Goal: Will remain free from infection Outcome: Progressing Goal: Diagnostic test results will improve Outcome: Progressing

## 2023-03-16 NOTE — Progress Notes (Signed)
 PROGRESS NOTE    Sharon Bailey  ZOX:[id number] DOB: August 07, 1930 DOA: 03/14/2023 PCP: Irven Coe, MD    Brief Narrative:   Sharon Bailey is a 89 y.o. female with past medical history significant for chronic hypoxic respiratory failure on 2L [place], mild intermittent asthma, HTN, HLD, hypothyroidism, chronic diastolic congestive heart failure, mild aortic stenosis, CKD stage IIIb (Cr 1.2-1.5 baseline), anemia of chronic medical disease (Hgb 8-12), who presented to [facility] on 03/14/2023 from home with complaints of lower extremity edema, weeping, blisters and erythema.  Onset of erythema dorsum left foot over the last 2-3 days with warmth, tenderness and subjective fever.  Was seen by PCP 2 days prior, diagnosed with left foot cellulitis and started on prescription for Keflex.  Has been taking over the last 2 days with no significant improvement which prompted her to seek further care in the ED.  Denies any recent trauma to left foot, no sensory deficits and no calf tenderness.  In the ED, temperature 97.5 F, HR 105, RR 22, BP 92/59, SpO2 100% on room air.  WBC 9.2, hemoglobin 8.7, platelet count 235.  Sodium 133, potassium 4.0, chloride 103, CO2 21, glucose 114, BUN 40, creatinine 1.84.  AST 14, ALT 13, total open 0.9.  Lactic acid 1.6.  BNP 127.9.  INR 1.2.  Patient was given 1 dose of IV cefazolin, started on normal saline at 75 mL/h.  TRH consulted for admission patient was transferred to [facility] for further evaluation and management.  Assessment & Plan:   Left foot cellulitis with blisters Patient presenting to ED with progressive lower extremity edema, erythema, pain to left foot.  Recently prescribed Keflex outpatient with no improvement over the last 2 days.  Patient is afebrile without leukocytosis. Left foot x-ray: With no acute osseous abnormality, chronic juxta-articular erosion along the lateral margin second more tarsal head. -- Ancef 2 g IV every 12 hours -- Cleanse  bilateral lower leg and feet wounds (including intact blister) with Vashe wound cleanser, do not rinse and allow to air dry. Apply Xeroform to blisters and wound beds daily. Cover with ABD pad and secure with Kerlix roll gauze beginning right above toes and ending right below knees. May apply Ace bandage wrapped in same fashion as Kerlix to provide light compression.   Hyponatremia: Resolved Etiology likely secondary to hypovolemic hyponatremia in the setting of poor oral intake in the days preceding hospitalization.  Sodium 133 on admission. -- Na 811>914>782 -- Discontinue IV fluids today -- repeat BMP in the am  Acute renal failure on CKD stage IIIb Baseline creatinine 1.2-1.5.  Creatinine elevated on admission to 1.84.  Etiology likely secondary to prerenal azotemia in the setting of dehydration versus ATN given she had some mild hypotension on admission. -- Cr 1.84>1.61>1.35 -- Discontinue IV fluids today -- Avoid nephrotoxins, renal dose all medications -- Repeat BMP in a.m.  Recent left hip fracture s/p IMN Outpatient follow-up with orthopedics, Dr. Hulda Humphrey.   Chronic hypoxic respiratory failure on 2L  at baseline Mild intermittent asthma -- Albuterol neb every 4 hours.  Wheezing/shortness of breath  HTN Chronic diastolic congestive heart failure, compensated Patient with hypotension on admission, BP 131/60.  Home regimen includes Norvasc 5 mg p.o. daily -- Hold home Norvasc; may need to discontinue on discharge -- IV fluid hydration as above -- Continue BP closely  Hypothyroidism -- Levothyroxine 88 mcg p.o. daily  Hx collagenous colitis Follow-up gastroenterology outpatient, Dr. Marina Goodell. -- Continue budesonide 3 mg p.o.  daily  Hx Gout -- Allopurinol 150 mg p.o. daily  Hyperlipidemia -- Simvastatin 40 mg p.o. daily  Anemia of chronic medical disease Based on hemoglobin 8-12.  Anemia panel with iron 35, TIBC 160, ferritin 513, folate 23.7, B12 3000 161. -- hgb  8.4>7.7>6.8 -- Transfuse 1 unit PRBC today -- Transfuse for hemoglobin less than 7.0 -- CBC in the am  Pressure injury, stage II buttock; POA -- seen by wound RN -- Apply silicone foam as per skin care order set for Stage 2 Pressure Injuries buttocks. Lift foam daily to assess. Reconsult WOC team if any necrotic tissue (yellow/brown/black) develops.   Weakness/debility/deconditioning: -- PT/OT evaluation: Pending -- TOC consulted for home health needs   DVT prophylaxis: SCDs Start: 03/15/23 0253    Code Status: Limited: Do not attempt resuscitation (DNR) -DNR-LIMITED -Do Not Intubate/DNI  Family Communication: Updated daughter present at bedside this morning  Disposition Plan:  Level of care: Med-Surg Status is: Inpatient Remains inpatient appropriate because: IV antibiotics; blood transfusion, anticipate likely discharge home tomorrow with home health    Consultants:  None  Procedures:  None  Antimicrobials:  Ancef 3/9>>   Subjective: The patient seen examined bedside, lying in bed.  Daughter present at bedside.  Remains on IV antibiotics.  Feels well and wants to know when she can go home.  Discussed will need blood transfusion today for hemoglobin of 6.8.  Discussed if hemoglobin remains stable tomorrow anticipate likely discharge home then.  Awaiting PT/OT evaluation with anticipated plan for discharge home with home health services.  Patient denies headache, no dizziness, no chest pain, no palpitations, no shortness of breath, no abdominal pain, no fever/chills/night sweats, no nausea cefonicid diarrhea.  No acute events overnight per nursing.  Objective: Vitals:   03/16/23 0007 03/16/23 0500 03/16/23 0623 03/16/23 0901  BP: (!) 117/56  (!) 108/54 131/60  Pulse: 80  77 83  Resp: 20  19 18   Temp: (!) 97.4 F (36.3 C)  (!) 97.5 F (36.4 C) 98.4 F (36.9 C)  TempSrc: Oral  Oral Oral  SpO2: 99%  97% 99%  Weight:  66.1 kg    Height:        Intake/Output Summary  (Last 24 hours) at 03/16/2023 1013 Last data filed at 03/16/2023 1610 Gross per 24 hour  Intake 2112.57 ml  Output 900 ml  Net 1212.57 ml   Filed Weights   03/15/23 0100 03/15/23 0947 03/16/23 0500  Weight: 64 kg 64 kg 66.1 kg    Examination:  Physical Exam: GEN: NAD, alert and oriented x 3, chronically ill/elderly in appearance HEENT: NCAT, PERRL, EOMI, sclera clear, MMM PULM: CTAB w/o wheezes/crackles, normal respiratory effort, on 2L  which is her baseline CV: RRR w/o M/G/R GI: abd soft, NTND, + BS MSK: 1-2+ bilateral lower extremity peripheral edema, moves all extremities independently NEURO: No focal neurological deficit PSYCH: normal mood/affect Integumentary: Bilateral lower extremities with erythema, greatest on left edema as depicted below.  Otherwise no other concerning rashes/lesions/wounds noted on exposed skin surfaces            Data Reviewed: I have personally reviewed following labs and imaging studies  CBC: Recent Labs  Lab 03/14/23 2112 03/15/23 0404 03/16/23 0439  WBC 9.2 7.2 5.6  NEUTROABS 7.0 5.8  --   HGB 8.7* 7.7* 6.8*  HCT 27.7* 25.1* 23.4*  MCV 91.4 94.7 97.1  PLT 235 203 186   Basic Metabolic Panel: Recent Labs  Lab 03/14/23 2112 03/15/23 0404 03/16/23 0439  NA 133* 136 135  K 4.0 3.9 3.9  CL 103 107 111  CO2 21* 21* 20*  GLUCOSE 114* 124* 85  BUN 40* 41* 32*  CREATININE 1.84* 1.61* 1.35*  CALCIUM 8.3* 7.8* 7.5*  MG  --  1.4*  --    GFR: Estimated Creatinine Clearance: 25.9 mL/min (A) (by C-G formula based on SCr of 1.35 mg/dL (H)). Liver Function Tests: Recent Labs  Lab 03/14/23 2112 03/15/23 0404  AST 14* 16  ALT 13 12  ALKPHOS 122 95  BILITOT 0.9 0.9  PROT 5.7* 4.8*  ALBUMIN 2.8* 1.8*   No results for input(s): "LIPASE", "AMYLASE" in the last 168 hours. No results for input(s): "AMMONIA" in the last 168 hours. Coagulation Profile: Recent Labs  Lab 03/14/23 2112  INR 1.2   Cardiac Enzymes: Recent Labs   Lab 03/15/23 0404  CKTOTAL 19*   BNP (last 3 results) No results for input(s): "PROBNP" in the last 8760 hours. HbA1C: No results for input(s): "HGBA1C" in the last 72 hours. CBG: No results for input(s): "GLUCAP" in the last 168 hours. Lipid Profile: No results for input(s): "CHOL", "HDL", "LDLCALC", "TRIG", "CHOLHDL", "LDLDIRECT" in the last 72 hours. Thyroid Function Tests: No results for input(s): "TSH", "T4TOTAL", "FREET4", "T3FREE", "THYROIDAB" in the last 72 hours. Anemia Panel: Recent Labs    03/15/23 1202  VITAMINB12 3,869*  FOLATE 23.7  FERRITIN 513*  TIBC 160*  IRON 35  RETICCTPCT 5.0*   Sepsis Labs: Recent Labs  Lab 03/14/23 2115  LATICACIDVEN 1.6    Recent Results (from the past 240 hours)  Culture, blood (Routine x 2)     Status: None (Preliminary result)   Collection Time: 03/14/23  9:12 PM   Specimen: BLOOD  Result Value Ref Range Status   Specimen Description   Final    BLOOD LEFT ANTECUBITAL Performed at [facility], [address] [place], [place] [address]    Special Requests   Final    BOTTLES DRAWN AEROBIC AND ANAEROBIC Blood Culture results may not be optimal due to an inadequate volume of blood received in culture bottles Performed at [facility], [address] [place], [place] [address]    Culture   Final    NO GROWTH < 24 HOURS Performed at [facility], [address], [place], [place] [address]    Report Status PENDING  Incomplete  Culture, blood (Routine x 2)     Status: None (Preliminary result)   Collection Time: 03/15/23  4:04 AM   Specimen: BLOOD RIGHT ARM  Result Value Ref Range Status   Specimen Description   Final    BLOOD RIGHT ARM Performed at [facility], [address] [place], [place] [address]    Special Requests   Final    BOTTLES DRAWN AEROBIC AND ANAEROBIC Blood Culture results may not be optimal due to an inadequate volume of blood received in culture  bottles Performed at [facility], [address], [place], [place] [address]    Culture   Final    NO GROWTH 1 DAY Performed at [facility], [address] [place], [place] [address]    Report Status PENDING  Incomplete         Radiology Studies: ECHOCARDIOGRAM COMPLETE Result Date: 03/15/2023    ECHOCARDIOGRAM REPORT   Patient Name:   DALEIGH POLLINGER Date of Exam: 03/15/2023 Medical Rec #:  [id number]      Height:  64.0 in Accession #:    [id number]     Weight:       141.1 lb Date of Birth:  1930-05-28      BSA:          1.687 m Patient Age:    89 years       BP:           120/57 mmHg Patient Gender: F              HR:           80 bpm. Exam Location:  Inpatient Procedure: 2D Echo, Cardiac Doppler and Color Doppler (Both Spectral and Color            Flow Doppler were utilized during procedure). Indications:    Aortic Stenosis  History:        Patient has prior history of Echocardiogram examinations, most                 recent 06/12/2019. CHF, Stroke, Aortic Valve Disease; Risk                 Factors:Non-Smoker, Dyslipidemia and Hypertension.  Sonographer:    Dondra Prader RVT RCS Referring Phys: [id number] JUSTIN B HOWERTER IMPRESSIONS  1. Left ventricular ejection fraction, by estimation, is 55 to 60%. The left ventricle has normal function. The left ventricle has no regional wall motion abnormalities. There is mild concentric left ventricular hypertrophy. Left ventricular diastolic parameters are consistent with Grade I diastolic dysfunction (impaired relaxation).  2. Right ventricular systolic function is normal. The right ventricular size is normal. There is normal pulmonary artery systolic pressure. The estimated right ventricular systolic pressure is 23.1 mmHg.  3. The mitral valve is normal in structure. Trivial mitral valve regurgitation. No evidence of mitral stenosis.  4. The aortic valve is tricuspid. There is severe calcifcation of the aortic valve. Aortic valve  regurgitation is not visualized. Moderate paradoxical low flow/low gradient aortic valve stenosis. Aortic valve area, by VTI measures 1.16 cm. Aortic valve  mean gradient measures 14.0 mmHg.  5. The inferior vena cava is dilated in size with >50% respiratory variability, suggesting right atrial pressure of 8 mmHg. FINDINGS  Left Ventricle: Left ventricular ejection fraction, by estimation, is 55 to 60%. The left ventricle has normal function. The left ventricle has no regional wall motion abnormalities. The left ventricular internal cavity size was normal in size. There is  mild concentric left ventricular hypertrophy. Left ventricular diastolic parameters are consistent with Grade I diastolic dysfunction (impaired relaxation). Right Ventricle: The right ventricular size is normal. No increase in right ventricular wall thickness. Right ventricular systolic function is normal. There is normal pulmonary artery systolic pressure. The tricuspid regurgitant velocity is 1.94 m/s, and  with an assumed right atrial pressure of 8 mmHg, the estimated right ventricular systolic pressure is 23.1 mmHg. Left Atrium: Left atrial size was normal in size. Right Atrium: Right atrial size was normal in size. Pericardium: There is no evidence of pericardial effusion. Mitral Valve: The mitral valve is normal in structure. Mild to moderate mitral annular calcification. Trivial mitral valve regurgitation. No evidence of mitral valve stenosis. Tricuspid Valve: The tricuspid valve is normal in structure. Tricuspid valve regurgitation is trivial. Aortic Valve: The aortic valve is tricuspid. There is severe calcifcation of the aortic valve. Aortic valve regurgitation is not visualized. Moderate aortic stenosis is present. Aortic valve mean gradient measures 14.0 mmHg. Aortic valve peak gradient measures 25.2 mmHg. Aortic valve area, by  VTI measures 1.16 cm. Pulmonic Valve: The pulmonic valve was normal in structure. Pulmonic valve  regurgitation is not visualized. Aorta: The aortic root is normal in size and structure. Venous: The inferior vena cava is dilated in size with greater than 50% respiratory variability, suggesting right atrial pressure of 8 mmHg. IAS/Shunts: No atrial level shunt detected by color flow Doppler.  LEFT VENTRICLE PLAX 2D LVIDd:         3.50 cm   Diastology LVIDs:         3.10 cm   LV e' medial:    4.60 cm/s LV PW:         1.20 cm   LV E/e' medial:  19.3 LV IVS:        1.20 cm   LV e' lateral:   4.74 cm/s LVOT diam:     1.80 cm   LV E/e' lateral: 18.7 LV SV:         56 LV SV Index:   33 LVOT Area:     2.54 cm  RIGHT VENTRICLE             IVC RV Basal diam:  3.10 cm     IVC diam: 2.10 cm RV S prime:     11.20 cm/s TAPSE (M-mode): 1.7 cm LEFT ATRIUM             Index        RIGHT ATRIUM          Index LA diam:        3.00 cm 1.78 cm/m   RA Area:     7.77 cm LA Vol (A2C):   29.3 ml 17.37 ml/m  RA Volume:   15.40 ml 9.13 ml/m LA Vol (A4C):   27.2 ml 16.13 ml/m LA Biplane Vol: 31.0 ml 18.38 ml/m  AORTIC VALVE                     PULMONIC VALVE AV Area (Vmax):    1.09 cm      PV Vmax:       0.81 m/s AV Area (Vmean):   1.09 cm      PV Peak grad:  2.6 mmHg AV Area (VTI):     1.16 cm AV Vmax:           251.00 cm/s AV Vmean:          174.000 cm/s AV VTI:            0.482 m AV Peak Grad:      25.2 mmHg AV Mean Grad:      14.0 mmHg LVOT Vmax:         108.00 cm/s LVOT Vmean:        74.400 cm/s LVOT VTI:          0.219 m LVOT/AV VTI ratio: 0.45  AORTA Ao Root diam: 3.00 cm Ao Asc diam:  2.40 cm MITRAL VALVE                TRICUSPID VALVE MV Area (PHT): 3.77 cm     TR Peak grad:   15.1 mmHg MV Decel Time: 201 msec     TR Vmax:        194.00 cm/s MV E velocity: 88.60 cm/s MV A velocity: 141.00 cm/s  SHUNTS MV E/A ratio:  0.63         Systemic VTI:  0.22 m  Systemic Diam: 1.80 cm Dalton McleanMD Electronically signed by Wilfred Lacy Signature Date/Time: 03/15/2023/1:25:00 PM    Final    DG Foot  2 Views Left Result Date: 03/15/2023 CLINICAL DATA:  Left foot cellulitis. EXAM: LEFT FOOT - 2 VIEW COMPARISON:  None Available. FINDINGS: No acute fracture or dislocation. Postsurgical changes of the third toe. Chronic juxta-articular erosion along the lateral margin the second metatarsal head. Midfoot degenerative changes. Osteopenia. Soft tissues are unremarkable. IMPRESSION: 1. No acute osseous abnormality. 2. Chronic juxta-articular erosion along the lateral margin the second metatarsal head, which can be seen in the setting of gout. Electronically Signed   By: Obie Dredge M.D.   On: 03/15/2023 12:28        Scheduled Meds:  sodium chloride   Intravenous Once   allopurinol  150 mg Oral Daily   aspirin EC  81 mg Oral Daily   budesonide  3 mg Oral Daily   ferrous sulfate  325 mg Oral Q breakfast   levothyroxine  88 mcg Oral Q0600   loratadine  10 mg Oral Daily   simvastatin  40 mg Oral QPM   Continuous Infusions:   ceFAZolin (ANCEF) IV 2 g (03/15/23 2257)     LOS: 1 day    Time spent: 48 minutes spent on 03/16/2023 caring for this patient face-to-face including chart review, ordering labs/tests, documenting, discussion with nursing staff, consultants, updating family and interview/physical exam    Alvira Philips [place], DO [facility] Available via Epic secure chat 7am-7pm After these hours, please refer to coverage provider listed on [email or web address] 03/16/2023, 10:13 AM

## 2023-03-17 DIAGNOSIS — L03116 Cellulitis of left lower limb: Secondary | ICD-10-CM | POA: Diagnosis not present

## 2023-03-17 DIAGNOSIS — H903 Sensorineural hearing loss, bilateral: Secondary | ICD-10-CM | POA: Diagnosis not present

## 2023-03-17 LAB — BPAM RBC
Blood Product Expiration Date: 202504072359
ISSUE DATE / TIME: 202503111107
Unit Type and Rh: 5100

## 2023-03-17 LAB — TYPE AND SCREEN
ABO/RH(D): O POS
Antibody Screen: NEGATIVE
Unit division: 0

## 2023-03-17 LAB — BASIC METABOLIC PANEL
Anion gap: 7 (ref 5–15)
BUN: 24 mg/dL — ABNORMAL HIGH (ref 8–23)
CO2: 19 mmol/L — ABNORMAL LOW (ref 22–32)
Calcium: 7.7 mg/dL — ABNORMAL LOW (ref 8.9–10.3)
Chloride: 111 mmol/L (ref 98–111)
Creatinine, Ser: 1.24 mg/dL — ABNORMAL HIGH (ref 0.44–1.00)
GFR, Estimated: 41 mL/min — ABNORMAL LOW (ref >60)
Glucose, Bld: 80 mg/dL (ref 70–99)
Potassium: 3.4 mmol/L — ABNORMAL LOW (ref 3.5–5.1)
Sodium: 137 mmol/L (ref 135–145)

## 2023-03-17 LAB — HEMOGLOBIN AND HEMATOCRIT, BLOOD
HCT: 28.6 % — ABNORMAL LOW (ref 36.0–46.0)
Hemoglobin: 9.1 g/dL — ABNORMAL LOW (ref 12.0–15.0)

## 2023-03-17 MED ORDER — CEPHALEXIN 500 MG PO CAPS: 500.0000 mg | ORAL_CAPSULE | Freq: Four times a day (QID) | ORAL | 0 refills | Status: AC

## 2023-03-17 NOTE — Progress Notes (Signed)
 Discharge instructions given to patient and all questions were answered.

## 2023-03-17 NOTE — TOC Transition Note (Signed)
 Transition of Care [facility]) - Discharge Note   Patient Details  Name: Sharon Bailey MRN: [id number] Date of Birth: 1930-07-26  [facility]) CM/SW Contact:  Amada Jupiter, LCSW Phone Number: 03/17/2023, 12:47 PM   Clinical Narrative:     Pt medically cleared for dc home today.  HH services resumed with [facility].  No further TOC needs.  Final next level of care: Home w Home Health Services Barriers to Discharge: Continued Medical Work up   Patient Goals and CMS Choice Patient states their goals for this hospitalization and ongoing recovery are:: return home with family          Discharge Placement                       Discharge Plan and Services Additional resources added to the After Visit Summary for   In-house Referral: Clinical Social Work   Post Acute Care Choice: Home Health          DME Arranged: N/A DME Agency: NA       HH Arranged: RN, PT, OT HH Agency: [facility] Date [facility] Agency Contacted: 03/17/23   Representative spoke with at [facility] Agency: Becky Sax  Social Drivers of Health (SDOH) Interventions SDOH Screenings   Food Insecurity: No Food Insecurity (03/15/2023)  Housing: Low Risk  (03/15/2023)  Transportation Needs: No Transportation Needs (03/15/2023)  Utilities: Not At Risk (03/15/2023)  Depression (PHQ2-9): Low Risk  (02/21/2022)  Social Connections: Moderately Integrated (03/15/2023)  Tobacco Use: Low Risk  (03/15/2023)     Readmission Risk Interventions    03/15/2023    1:03 PM  Readmission Risk Prevention Plan  Transportation Screening Complete  PCP or Specialist Appt within 3-5 Days Complete  HRI or Home Care Consult Complete  Social Work Consult for Recovery Care Planning/Counseling Complete  Palliative Care Screening Not Applicable  Medication Review Oceanographer) Complete

## 2023-03-17 NOTE — Evaluation (Signed)
 Physical Therapy Evaluation Patient Details Name: Sharon Bailey MRN: [id number] DOB: 1930/01/27 Today's Date: 03/17/2023  History of Present Illness  89 y.o. female who presented to [facility] on 03/14/2023 from home with complaints of lower extremity edema, weeping, blisters and erythema. Dx of cellulitis.  Pt with past medical history significant for chronic hypoxic respiratory failure on 2L , mild intermittent asthma, HTN, HLD, hypothyroidism, chronic diastolic congestive heart failure, mild aortic stenosis, CKD stage IIIb (Cr 1.2-1.5 baseline), anemia. Pt had recent admission for L hip fx on 02/05/23, s/p IM nail.  Clinical Impression  Pt admitted with above diagnosis. Mod assist for bed mobility. Min A for sit to stand transfer. Upon standing pt was incontinent of urine so was assisted to bedside commode. She required extended time on bedside commode to have a BM, ambulation was not assessed. Prior to admission pt was walking with a RW at home. She'd recently been [facility] following [facility] hip fx, and had been home for ~1 week prior to this admission. She has 24* assistance from family at home and hopes to return home and resume HHPT.  Pt currently with functional limitations due to the deficits listed below (see PT Problem List). Pt will benefit from acute skilled PT to increase their independence and safety with mobility to allow discharge.           If plan is discharge home, recommend the following: A little help with walking and/or transfers;A little help with bathing/dressing/bathroom;Assist for transportation;Help with stairs or ramp for entrance;Assistance with cooking/housework   Can travel by private vehicle        Equipment Recommendations None recommended by PT  Recommendations for Other Services       Functional Status Assessment Patient has had a recent decline in their functional status and demonstrates the ability to make significant improvements in  function in a reasonable and predictable amount of time.     Precautions / Restrictions Precautions Precautions: Fall Recall of Precautions/Restrictions: Intact Restrictions Weight Bearing Restrictions Per Provider Order: No      Mobility  Bed Mobility Overal bed mobility: Needs Assistance Bed Mobility: Supine to Sit     Supine to sit: Mod assist     General bed mobility comments: assist to raise trunk/pivot hips to edge of bed    Transfers Overall transfer level: Needs assistance Equipment used: Rolling walker (2 wheels) Transfers: Sit to/from Stand, Bed to chair/wheelchair/BSC Sit to Stand: Min assist, From elevated surface   Step pivot transfers: Contact guard assist       General transfer comment: VCs hand placement for sit to stand, pt took several pivotal steps to bedside commode    Ambulation/Gait               General Gait Details: NT- pt needed extended time on bedside commode to have BM  Stairs            Wheelchair Mobility     Tilt Bed    Modified Rankin (Stroke Patients Only)       Balance Overall balance assessment: Needs assistance Sitting-balance support: Feet supported, No upper extremity supported Sitting balance-Leahy Scale: Good     Standing balance support: Bilateral upper extremity supported, During functional activity, Reliant on assistive device for balance Standing balance-Leahy Scale: Poor                               Pertinent  Vitals/Pain Pain Assessment Pain Assessment: Faces Faces Pain Scale: Hurts even more Pain Location: BLEs Pain Descriptors / Indicators: Grimacing, Guarding Pain Intervention(s): Limited activity within patient's tolerance, Monitored during session, Repositioned    Home Living Family/patient expects to be discharged to:: Private residence Living Arrangements: Alone Available Help at Discharge: Family;Available 24 hours/day Type of Home: House Home Access: Stairs to  enter Entrance Stairs-Rails: Right;Left Entrance Stairs-Number of Steps: 3 garage, 4 front   Home Layout: One level Home Equipment: Agricultural consultant (2 wheels);Cane - quad;Shower seat;Grab bars - tub/shower;BSC/3in1;Tub bench      Prior Function Prior Level of Function : Needs assist             Mobility Comments: uses RW since hip 02/05/23, DCed to SNF, has been home just about a week and had 1 HHPT session ADLs Comments: assist for ADLs and tub transfer; doesn't drive     Extremity/Trunk Assessment   Upper Extremity Assessment Upper Extremity Assessment: Overall WFL for tasks assessed    Lower Extremity Assessment Lower Extremity Assessment: Generalized weakness (B knee ext -4/5)    Cervical / Trunk Assessment Cervical / Trunk Assessment: Kyphotic  Communication   Communication Communication: Impaired Factors Affecting Communication: Hearing impaired    Cognition Arousal: Alert Behavior During Therapy: WFL for tasks assessed/performed   PT - Cognitive impairments: No apparent impairments                         Following commands: Intact       Cueing       General Comments      Exercises     Assessment/Plan    PT Assessment Patient needs continued PT services  PT Problem List Decreased activity tolerance;Decreased mobility;Decreased strength       PT Treatment Interventions Gait training;DME instruction;Functional mobility training;Therapeutic activities;Therapeutic exercise;Patient/family education    PT Goals (Current goals can be found in the Care Plan section)  Acute Rehab PT Goals Patient Stated Goal: return home, walk PT Goal Formulation: With patient/family Time For Goal Achievement: 03/31/23 Potential to Achieve Goals: Good    Frequency Min 3X/week     Co-evaluation               AM-PAC PT "6 Clicks" Mobility  Outcome Measure Help needed turning from your back to your side while in a flat bed without using  bedrails?: A Little Help needed moving from lying on your back to sitting on the side of a flat bed without using bedrails?: A Lot Help needed moving to and from a bed to a chair (including a wheelchair)?: A Little Help needed standing up from a chair using your arms (e.g., wheelchair or bedside chair)?: A Little Help needed to walk in hospital room?: A Little Help needed climbing 3-5 steps with a railing? : Total 6 Click Score: 15    End of Session Equipment Utilized During Treatment: Gait belt Activity Tolerance: Patient tolerated treatment well Patient left: in chair;with call bell/phone within reach;with family/visitor present Nurse Communication: Mobility status PT Visit Diagnosis: Difficulty in walking, not elsewhere classified (R26.2);Pain;Muscle weakness (generalized) (M62.81);History of falling (Z91.81) Pain - part of body: Ankle and joints of foot    Time: 1043-1100 PT Time Calculation (min) (ACUTE ONLY): 17 min   Charges:   PT Evaluation $PT Eval Moderate Complexity: 1 Mod   PT General Charges $$ ACUTE PT VISIT: 1 Visit         Tamala Ser  PT 03/17/2023  Acute Rehabilitation Services  Office [phone]

## 2023-03-17 NOTE — Discharge Summary (Signed)
 Physician Discharge Summary  EVERLIE EBLE WGN:[id number] DOB: February 11, 1930 DOA: 03/14/2023  PCP: Irven Coe, MD  Admit date: 03/14/2023 Discharge date: 03/17/2023  Admitted From: Home Disposition: Home  Recommendations for Outpatient Follow-up:  Follow up with PCP in 1-2 weeks Continue Keflex 500 mg p.o. 4 times daily to complete antibiotic course for cellulitis of her lower extremities  Home Health: PT/RN Equipment/Devices: None  Discharge Condition: Stable CODE STATUS: DNR Diet recommendation: Heart healthy diet  History of present illness:  BRIEANNE MIGNONE is a 89 y.o. female with past medical history significant for chronic hypoxic respiratory failure on 2L [place], mild intermittent asthma, HTN, HLD, hypothyroidism, chronic diastolic congestive heart failure, mild aortic stenosis, CKD stage IIIb (Cr 1.2-1.5 baseline), anemia of chronic medical disease (Hgb 8-12), who presented to [facility] on 03/14/2023 from home with complaints of lower extremity edema, weeping, blisters and erythema.  Onset of erythema dorsum left foot over the last 2-3 days with warmth, tenderness and subjective fever.  Was seen by PCP 2 days prior, diagnosed with left foot cellulitis and started on prescription for Keflex.  Has been taking over the last 2 days with no significant improvement which prompted her to seek further care in the ED.  Denies any recent trauma to left foot, no sensory deficits and no calf tenderness.   In the ED, temperature 97.5 F, HR 105, RR 22, BP 92/59, SpO2 100% on room air.  WBC 9.2, hemoglobin 8.7, platelet count 235.  Sodium 133, potassium 4.0, chloride 103, CO2 21, glucose 114, BUN 40, creatinine 1.84.  AST 14, ALT 13, total open 0.9.  Lactic acid 1.6.  BNP 127.9.  INR 1.2.  Patient was given 1 dose of IV cefazolin, started on normal saline at 75 mL/h.  TRH consulted for admission patient was transferred to [facility] for further evaluation and management.  Hospital  course:  Left foot cellulitis with blisters Patient presenting to ED with progressive lower extremity edema, erythema, pain to left foot.  Recently prescribed Keflex outpatient with no improvement over the last 2 days.  Patient is afebrile without leukocytosis.  Left foot x-ray with no acute osseous abnormality.  Patient was started on Ancef with improvement of symptoms and will continue Keflex 500 mg p.o. 4 times daily to complete 14-day course.  Continue wound care with cleanse bilateral lower leg and feet wounds (including intact blister) with Vashe wound cleanser, do not rinse and allow to air dry. Apply Xeroform to blisters and wound beds daily. Cover with ABD pad and secure with Kerlix roll gauze beginning right above toes and ending right below knees. May apply Ace bandage wrapped in same fashion as Kerlix to provide light compression.  Outpatient follow-up with PCP.   Hyponatremia: Resolved Etiology likely secondary to hypovolemic hyponatremia in the setting of poor oral intake in the days preceding hospitalization.  Sodium 133 on admission.  Supported with IV fluid hydration with improvement of sodium to 136 at time of discharge.   Acute renal failure on CKD stage IIIb Baseline creatinine 1.2-1.5.  Creatinine elevated on admission to 1.84.  Etiology likely secondary to prerenal azotemia in the setting of dehydration versus ATN given she had some mild hypotension on admission.  Creatinine improved to 1.24 at time of discharge following IV fluid administration.   Recent left hip fracture s/p IMN Outpatient follow-up with orthopedics, Dr. Hulda Humphrey.    Chronic hypoxic respiratory failure on 2L [place] at baseline Mild intermittent asthma Stable.   HTN Chronic  diastolic congestive heart failure, compensated Home regimen includes Norvasc 5 mg p.o. daily.  Patient was recently started on furosemide by PCP to help with fluid mobilization of her lower extremities.  Will discontinue home Norvasc.  Patient  encouraged to maintain blood pressure log to bring to next PCP visit.  BP 138/66 at time of discharge.   Hypothyroidism Levothyroxine 88 mcg p.o. daily   Hx collagenous colitis Follow-up gastroenterology outpatient, Dr. Marina Goodell. Continue budesonide 3 mg p.o. daily   Hx Gout Allopurinol 150 mg p.o. daily   Hyperlipidemia -- Simvastatin 40 mg p.o. daily   Anemia of chronic medical disease Based on hemoglobin 8-12.  Patient was transfused 1 unit PRBC during hospitalization, hemoglobin 9.1 at time of discharge.   Pressure injury, stage II buttock; POA Apply silicone foam as per skin care order set for Stage 2 Pressure Injuries buttocks. Lift foam daily to assess. Reconsult WOC team if any necrotic tissue (yellow/brown/black) develops.   Discharge Diagnoses:  Principal Problem:   Cellulitis of left lower extremity Active Problems:   Essential hypertension   HLD (hyperlipidemia)   Acute renal failure superimposed on stage 3b chronic kidney disease (HCC)   Allergic rhinitis   Acquired hypothyroidism   Acute hyponatremia   Chronic hypoxic respiratory failure (HCC)   Chronic diastolic CHF (congestive heart failure) (HCC)   Mild aortic stenosis   Mild intermittent asthma   History of anemia due to chronic kidney disease    Discharge Instructions  Discharge Instructions     Call MD for:  difficulty breathing, headache or visual disturbances   Complete by: As directed    Call MD for:  extreme fatigue   Complete by: As directed    Call MD for:  persistant dizziness or light-headedness   Complete by: As directed    Call MD for:  persistant nausea and vomiting   Complete by: As directed    Call MD for:  severe uncontrolled pain   Complete by: As directed    Call MD for:  temperature >100.4   Complete by: As directed    Diet - low sodium heart healthy   Complete by: As directed    Discharge wound care:   Complete by: As directed    Cleanse bilateral lower leg and foot wounds  (including intact blister) with Vashe wound cleanser, do not rinse and allow to air dry. Apply Xeroform gauze to blisters and wound beds daily.  Cover with ABD pad and secure with Kerlix roll gauze beginning right above toes and ending right below knees.  May apply Ace bandage wrapped in same fashion as Kerlix to provide light compression.   Apply silicone foam as per skin care order set for Stage 2 Pressure Injuries buttocks.  Lift foam daily to assess.   Increase activity slowly   Complete by: As directed       Allergies as of 03/17/2023       Reactions   Bactrim [sulfamethoxazole-trimethoprim] Diarrhea, Nausea Only   Cipro [ciprofloxacin Hcl] Hives        Medication List     STOP taking these medications    amLODipine 5 MG tablet Commonly known as: NORVASC   valsartan 160 MG tablet Commonly known as: DIOVAN   ZyrTEC Allergy 10 MG tablet Generic drug: cetirizine       TAKE these medications    allopurinol 300 MG tablet Commonly known as: ZYLOPRIM Take 0.5 tablets (150 mg total) by mouth daily. What changed: how much to take  aspirin EC 81 MG tablet Take 81 mg by mouth daily. Swallow whole.   budesonide 3 MG 24 hr capsule Commonly known as: ENTOCORT EC TAKE 1 CAPSULE EVERY DAY AS DIRECTED What changed:  how much to take how to take this when to take this additional instructions   cephALEXin 500 MG capsule Commonly known as: KEFLEX Take 1 capsule (500 mg total) by mouth 4 (four) times daily for 7 days. What changed: when to take this   cyanocobalamin 1000 MCG tablet Take 1 tablet (1,000 mcg total) by mouth daily.   ferrous sulfate 325 (65 FE) MG EC tablet Take 325 mg by mouth daily with breakfast.   levothyroxine 88 MCG tablet Commonly known as: SYNTHROID Take 88 mcg by mouth in the morning.   OXYGEN Inhale 2 L/min into the lungs as needed (for shortness of breath or chest pain).   polyethylene glycol 17 g packet Commonly known as: MIRALAX /  GLYCOLAX Take 17 g by mouth daily as needed for moderate constipation. What changed: reasons to take this   simvastatin 40 MG tablet Commonly known as: ZOCOR Take 40 mg by mouth daily.   traMADol 50 MG tablet Commonly known as: ULTRAM Take 1 tablet (50 mg total) by mouth every 8 (eight) hours as needed for severe pain (pain score 7-10). What changed: when to take this               Discharge Care Instructions  (From admission, onward)           Start     Ordered   03/17/23 0000  Discharge wound care:       Comments: Cleanse bilateral lower leg and foot wounds (including intact blister) with Vashe wound cleanser, do not rinse and allow to air dry. Apply Xeroform gauze to blisters and wound beds daily.  Cover with ABD pad and secure with Kerlix roll gauze beginning right above toes and ending right below knees.  May apply Ace bandage wrapped in same fashion as Kerlix to provide light compression.   Apply silicone foam as per skin care order set for Stage 2 Pressure Injuries buttocks.  Lift foam daily to assess.   03/17/23 1036            Follow-up Information     Irven Coe, MD. Schedule an appointment as soon as possible for a visit in 1 week(s).   Specialty: Family Medicine Contact information: [address] [place] [address] [phone]                Allergies  Allergen Reactions   Bactrim [Sulfamethoxazole-Trimethoprim] Diarrhea and Nausea Only   Cipro [Ciprofloxacin Hcl] Hives    Consultations: None   Procedures/Studies: ECHOCARDIOGRAM COMPLETE Result Date: 03/15/2023    ECHOCARDIOGRAM REPORT   Patient Name:   JANE BIRKEL Date of Exam: 03/15/2023 Medical Rec #:  [id number]      Height:       64.0 in Accession #:    [id number]     Weight:       141.1 lb Date of Birth:  1930-10-12      BSA:          1.687 m Patient Age:    89 years       BP:           120/57 mmHg Patient Gender: F              HR:  80 bpm. Exam  Location:  Inpatient Procedure: 2D Echo, Cardiac Doppler and Color Doppler (Both Spectral and Color            Flow Doppler were utilized during procedure). Indications:    Aortic Stenosis  History:        Patient has prior history of Echocardiogram examinations, most                 recent 06/12/2019. CHF, Stroke, Aortic Valve Disease; Risk                 Factors:Non-Smoker, Dyslipidemia and Hypertension.  Sonographer:    Dondra Prader RVT RCS Referring Phys: [id number] JUSTIN B HOWERTER IMPRESSIONS  1. Left ventricular ejection fraction, by estimation, is 55 to 60%. The left ventricle has normal function. The left ventricle has no regional wall motion abnormalities. There is mild concentric left ventricular hypertrophy. Left ventricular diastolic parameters are consistent with Grade I diastolic dysfunction (impaired relaxation).  2. Right ventricular systolic function is normal. The right ventricular size is normal. There is normal pulmonary artery systolic pressure. The estimated right ventricular systolic pressure is 23.1 mmHg.  3. The mitral valve is normal in structure. Trivial mitral valve regurgitation. No evidence of mitral stenosis.  4. The aortic valve is tricuspid. There is severe calcifcation of the aortic valve. Aortic valve regurgitation is not visualized. Moderate paradoxical low flow/low gradient aortic valve stenosis. Aortic valve area, by VTI measures 1.16 cm. Aortic valve  mean gradient measures 14.0 mmHg.  5. The inferior vena cava is dilated in size with >50% respiratory variability, suggesting right atrial pressure of 8 mmHg. FINDINGS  Left Ventricle: Left ventricular ejection fraction, by estimation, is 55 to 60%. The left ventricle has normal function. The left ventricle has no regional wall motion abnormalities. The left ventricular internal cavity size was normal in size. There is  mild concentric left ventricular hypertrophy. Left ventricular diastolic parameters are consistent with Grade I  diastolic dysfunction (impaired relaxation). Right Ventricle: The right ventricular size is normal. No increase in right ventricular wall thickness. Right ventricular systolic function is normal. There is normal pulmonary artery systolic pressure. The tricuspid regurgitant velocity is 1.94 m/s, and  with an assumed right atrial pressure of 8 mmHg, the estimated right ventricular systolic pressure is 23.1 mmHg. Left Atrium: Left atrial size was normal in size. Right Atrium: Right atrial size was normal in size. Pericardium: There is no evidence of pericardial effusion. Mitral Valve: The mitral valve is normal in structure. Mild to moderate mitral annular calcification. Trivial mitral valve regurgitation. No evidence of mitral valve stenosis. Tricuspid Valve: The tricuspid valve is normal in structure. Tricuspid valve regurgitation is trivial. Aortic Valve: The aortic valve is tricuspid. There is severe calcifcation of the aortic valve. Aortic valve regurgitation is not visualized. Moderate aortic stenosis is present. Aortic valve mean gradient measures 14.0 mmHg. Aortic valve peak gradient measures 25.2 mmHg. Aortic valve area, by VTI measures 1.16 cm. Pulmonic Valve: The pulmonic valve was normal in structure. Pulmonic valve regurgitation is not visualized. Aorta: The aortic root is normal in size and structure. Venous: The inferior vena cava is dilated in size with greater than 50% respiratory variability, suggesting right atrial pressure of 8 mmHg. IAS/Shunts: No atrial level shunt detected by color flow Doppler.  LEFT VENTRICLE PLAX 2D LVIDd:         3.50 cm   Diastology LVIDs:         3.10 cm   LV e'  medial:    4.60 cm/s LV PW:         1.20 cm   LV E/e' medial:  19.3 LV IVS:        1.20 cm   LV e' lateral:   4.74 cm/s LVOT diam:     1.80 cm   LV E/e' lateral: 18.7 LV SV:         56 LV SV Index:   33 LVOT Area:     2.54 cm  RIGHT VENTRICLE             IVC RV Basal diam:  3.10 cm     IVC diam: 2.10 cm RV S  prime:     11.20 cm/s TAPSE (M-mode): 1.7 cm LEFT ATRIUM             Index        RIGHT ATRIUM          Index LA diam:        3.00 cm 1.78 cm/m   RA Area:     7.77 cm LA Vol (A2C):   29.3 ml 17.37 ml/m  RA Volume:   15.40 ml 9.13 ml/m LA Vol (A4C):   27.2 ml 16.13 ml/m LA Biplane Vol: 31.0 ml 18.38 ml/m  AORTIC VALVE                     PULMONIC VALVE AV Area (Vmax):    1.09 cm      PV Vmax:       0.81 m/s AV Area (Vmean):   1.09 cm      PV Peak grad:  2.6 mmHg AV Area (VTI):     1.16 cm AV Vmax:           251.00 cm/s AV Vmean:          174.000 cm/s AV VTI:            0.482 m AV Peak Grad:      25.2 mmHg AV Mean Grad:      14.0 mmHg LVOT Vmax:         108.00 cm/s LVOT Vmean:        74.400 cm/s LVOT VTI:          0.219 m LVOT/AV VTI ratio: 0.45  AORTA Ao Root diam: 3.00 cm Ao Asc diam:  2.40 cm MITRAL VALVE                TRICUSPID VALVE MV Area (PHT): 3.77 cm     TR Peak grad:   15.1 mmHg MV Decel Time: 201 msec     TR Vmax:        194.00 cm/s MV E velocity: 88.60 cm/s MV A velocity: 141.00 cm/s  SHUNTS MV E/A ratio:  0.63         Systemic VTI:  0.22 m                             Systemic Diam: 1.80 cm Dalton McleanMD Electronically signed by Wilfred Lacy Signature Date/Time: 03/15/2023/1:25:00 PM    Final    DG Foot 2 Views Left Result Date: 03/15/2023 CLINICAL DATA:  Left foot cellulitis. EXAM: LEFT FOOT - 2 VIEW COMPARISON:  None Available. FINDINGS: No acute fracture or dislocation. Postsurgical changes of the third toe. Chronic juxta-articular erosion along the lateral margin the second metatarsal head. Midfoot degenerative changes. Osteopenia. Soft tissues are unremarkable. IMPRESSION:  1. No acute osseous abnormality. 2. Chronic juxta-articular erosion along the lateral margin the second metatarsal head, which can be seen in the setting of gout. Electronically Signed   By: Obie Dredge M.D.   On: 03/15/2023 12:28   VAS [place] UPPER EXTREMITY VENOUS DUPLEX Result Date: 02/24/2023 UPPER VENOUS  STUDY  Patient Name:  LUPITA ROSALES  Date of Exam:   02/24/2023 Medical Rec #: [id number]       Accession #:    [id number] Date of Birth: 03-11-1930       Patient Gender: F Patient Age:   75 years Exam Location:  [facility] Procedure:      VAS [place] UPPER EXTREMITY VENOUS DUPLEX Referring Phys: Dolly Rias --------------------------------------------------------------------------------  Indications: Report of R ulnar DVT and bilateral IJ DVT. Please assess for chronicity as able Risk Factors: None identified. Comparison Study: No prior studies. Performing Technologist: Chanda Busing RVT  Examination Guidelines: A complete evaluation includes B-mode imaging, spectral Doppler, color Doppler, and power Doppler as needed of all accessible portions of each vessel. Bilateral testing is considered an integral part of a complete examination. Limited examinations for reoccurring indications may be performed as noted.  Right Findings: +----------+------------+---------+-----------+----------+-------+ RIGHT     CompressiblePhasicitySpontaneousPropertiesSummary +----------+------------+---------+-----------+----------+-------+ IJV           Full       Yes       Yes                      +----------+------------+---------+-----------+----------+-------+ Subclavian    Full       Yes       Yes                      +----------+------------+---------+-----------+----------+-------+ Axillary      Full       Yes       Yes                      +----------+------------+---------+-----------+----------+-------+ Brachial      Full                                          +----------+------------+---------+-----------+----------+-------+ Radial        Full                                          +----------+------------+---------+-----------+----------+-------+ Ulnar         Full                                           +----------+------------+---------+-----------+----------+-------+ Cephalic      Full                                          +----------+------------+---------+-----------+----------+-------+ Basilic       Full                                          +----------+------------+---------+-----------+----------+-------+  Left Findings: +----------+------------+---------+-----------+----------+-------+ LEFT      CompressiblePhasicitySpontaneousPropertiesSummary +----------+------------+---------+-----------+----------+-------+ IJV           Full       Yes       Yes                      +----------+------------+---------+-----------+----------+-------+ Subclavian    Full       Yes       Yes                      +----------+------------+---------+-----------+----------+-------+ Axillary      Full       Yes       Yes                      +----------+------------+---------+-----------+----------+-------+ Brachial      Full                                          +----------+------------+---------+-----------+----------+-------+ Radial        Full                                          +----------+------------+---------+-----------+----------+-------+ Ulnar         Full                                          +----------+------------+---------+-----------+----------+-------+ Cephalic      Full                                          +----------+------------+---------+-----------+----------+-------+ Basilic       Full                                          +----------+------------+---------+-----------+----------+-------+  Summary:  Right: No evidence of deep vein thrombosis in the upper extremity. No evidence of superficial vein thrombosis in the upper extremity.  Left: No evidence of deep vein thrombosis in the upper extremity. No evidence of superficial vein thrombosis in the upper extremity.  *See table(s) above for measurements and  observations.  Diagnosing physician: Coral Else MD Electronically signed by Coral Else MD on 02/24/2023 at 8:09:09 PM.    Final    CT Angio Chest PE W and/or Wo Contrast Result Date: 02/23/2023 CLINICAL DATA:  Elbow and leg redness and swelling history of femur fracture hypoxia EXAM: CT ANGIOGRAPHY CHEST WITH CONTRAST TECHNIQUE: Multidetector CT imaging of the chest was performed using the standard protocol during bolus administration of intravenous contrast. Multiplanar CT image reconstructions and MIPs were obtained to evaluate the vascular anatomy. RADIATION DOSE REDUCTION: This exam was performed according to the departmental dose-optimization program which includes automated exposure control, adjustment of the mA and/or kV according to patient size and/or use of iterative reconstruction technique. CONTRAST:  60mL OMNIPAQUE IOHEXOL 350 MG/ML SOLN COMPARISON:  Chest x-ray 02/23/2023 FINDINGS: Cardiovascular: Satisfactory opacification of the pulmonary arteries to the segmental level. No evidence of pulmonary embolism.  Nonaneurysmal aorta. No dissection. Direct origin of left vertebral artery from the aortic arch. Moderate atherosclerosis. Coronary vascular calcification. Normal cardiac size. No pericardial effusion Mediastinum/Nodes: Patent trachea. No suspicious thyroid mass. No suspicious lymph nodes. Mild fluid distension of the esophagus. Circumferential wall thickening of the mid to distal esophagus with mild diffuse mucosal enhancement. Small moderate hiatal hernia with thickening at the GE junction. Lungs/Pleura: No acute airspace disease, pleural effusion, or pneumothorax. Upper Abdomen: Cholecystectomy. Prominent common bile duct likely due to surgical change. No acute finding Musculoskeletal: Degenerative changes. No acute osseous abnormality. Review of the MIP images confirms the above findings. IMPRESSION: 1. Negative for acute pulmonary embolus or aortic dissection. 2. No CT evidence for  acute pulmonary abnormality. 3. Circumferential wall thickening of the mid to distal esophagus with mild diffuse mucosal enhancement, findings suggestive of esophagitis. Small to moderate hiatal hernia with thickening at the GE junction, cannot exclude mass and consider correlation with endoscopy 4. Aortic atherosclerosis. Aortic Atherosclerosis (ICD10-I70.0). Electronically Signed   By: Jasmine Pang M.D.   On: 02/23/2023 18:25   DG CHEST PORT 1 VIEW Result Date: 02/23/2023 CLINICAL DATA:  Hypoxia. EXAM: PORTABLE CHEST 1 VIEW COMPARISON:  Chest radiograph dated 02/05/2023. FINDINGS: The heart size and mediastinal contours are within normal limits. Aortic atherosclerosis. No focal consolidation, pleural effusion, or pneumothorax. No acute osseous abnormality. IMPRESSION: No acute cardiopulmonary findings. Electronically Signed   By: Hart Robinsons M.D.   On: 02/23/2023 17:23   VAS [place] LOWER EXTREMITY VENOUS (DVT) (ONLY [facility] & [facility]) Result Date: 02/23/2023  Lower Venous DVT Study Patient Name:  ADIEL MCNAMARA  Date of Exam:   02/23/2023 Medical Rec #: [id number]       Accession #:    [id number] Date of Birth: September 06, 1930       Patient Gender: F Patient Age:   36 years Exam Location:  [facility] Procedure:      VAS [place] LOWER EXTREMITY VENOUS (DVT) Referring Phys: Alvira Monday --------------------------------------------------------------------------------  Indications: Swelling.  Risk Factors: Trauma. Limitations: Body habitus, poor ultrasound/tissue interface and patient positioning, patient pain tolerance. Comparison Study: No prior studies. Performing Technologist: Chanda Busing RVT  Examination Guidelines: A complete evaluation includes B-mode imaging, spectral Doppler, color Doppler, and power Doppler as needed of all accessible portions of each vessel. Bilateral testing is considered an integral part of a complete examination. Limited examinations for reoccurring indications may be performed as  noted. The reflux portion of the exam is performed with the patient in reverse Trendelenburg.  +-----+---------------+---------+-----------+----------+--------------+ RIGHTCompressibilityPhasicitySpontaneityPropertiesThrombus Aging +-----+---------------+---------+-----------+----------+--------------+ CFV  Full           Yes      Yes                                 +-----+---------------+---------+-----------+----------+--------------+   +---------+---------------+---------+-----------+----------+--------------+ LEFT     CompressibilityPhasicitySpontaneityPropertiesThrombus Aging +---------+---------------+---------+-----------+----------+--------------+ CFV      Full           Yes      Yes                                 +---------+---------------+---------+-----------+----------+--------------+ SFJ      Full                                                        +---------+---------------+---------+-----------+----------+--------------+  FV Prox  Full                                                        +---------+---------------+---------+-----------+----------+--------------+ FV Mid                  Yes      Yes                                 +---------+---------------+---------+-----------+----------+--------------+ FV Distal               Yes      Yes                                 +---------+---------------+---------+-----------+----------+--------------+ PFV      Full                                                        +---------+---------------+---------+-----------+----------+--------------+ POP      Full           Yes      Yes                                 +---------+---------------+---------+-----------+----------+--------------+ PTV      Full                                                        +---------+---------------+---------+-----------+----------+--------------+ PERO     Full                                                         +---------+---------------+---------+-----------+----------+--------------+     Summary: RIGHT: - No evidence of common femoral vein obstruction.   LEFT: - There is no evidence of deep vein thrombosis in the lower extremity. However, portions of this examination were limited- see technologist comments above.  - No cystic structure found in the popliteal fossa.  *See table(s) above for measurements and observations. Electronically signed by Lemar Livings MD on 02/23/2023 at 4:34:58 PM.    Final      Subjective: Patient seen examined bedside, lying in bed.  No complaints this morning.  Family present at bedside.  Ready for discharge home.  Discussed discontinuation of amlodipine as PCP just darted on furosemide given pressures have been normotensive during hospitalization off of amlodipine.  Also discussed maintain a BP log.  No other specific questions, concerns or complaints at this time.  Denies headache, no dizziness, no chest pain, no palpitations, no shortness of breath, no abdominal pain, no fever/chills/night sweats, no nausea cefonicid diarrhea, no focal weakness, fatigue, no paresthesias.  No acute events overnight per nursing staff.  Discharge Exam: Vitals:   03/16/23 2157 03/17/23 0523  BP: 131/61 138/66  Pulse: 82 85  Resp: 18 18  Temp: 98 F (36.7 C) 98.2 F (36.8 C)  SpO2: 96% 98%   Vitals:   03/16/23 1510 03/16/23 2157 03/17/23 0500 03/17/23 0523  BP: 112/64 131/61  138/66  Pulse: 82 82  85  Resp: 18 18  18   Temp: 98.3 F (36.8 C) 98 F (36.7 C)  98.2 F (36.8 C)  TempSrc: Oral Oral  Oral  SpO2: 100% 96%  98%  Weight:   65.8 kg   Height:        Physical Exam: GEN: NAD, alert and oriented x 3, chronically ill/elderly in appearance HEENT: NCAT, PERRL, EOMI, sclera clear, MMM PULM: CTAB w/o wheezes/crackles, normal respiratory effort, on 2L Magnet which is her baseline CV: RRR w/o M/G/R GI: abd soft, NTND, + BS MSK: 1-2+ bilateral lower  extremity peripheral edema, moves all extremities independently NEURO: No focal neurological deficit PSYCH: normal mood/affect Integumentary: Bilateral lower extremities with erythema, greatest on left edema as depicted below.  Otherwise no other concerning rashes/lesions/wounds noted on exposed skin surfaces         The results of significant diagnostics from this hospitalization (including imaging, microbiology, ancillary and laboratory) are listed below for reference.     Microbiology: Recent Results (from the past 240 hours)  Culture, blood (Routine x 2)     Status: None (Preliminary result)   Collection Time: 03/14/23  9:12 PM   Specimen: BLOOD  Result Value Ref Range Status   Specimen Description   Final    BLOOD LEFT ANTECUBITAL Performed at [facility], [address] [place], [place] [address]    Special Requests   Final    BOTTLES DRAWN AEROBIC AND ANAEROBIC Blood Culture results may not be optimal due to an inadequate volume of blood received in culture bottles Performed at [facility], [address] [place], [place] [address]    Culture   Final    NO GROWTH 2 DAYS Performed at [facility], [address] [place], [place] [address]    Report Status PENDING  Incomplete  Culture, blood (Routine x 2)     Status: None (Preliminary result)   Collection Time: 03/15/23  4:04 AM   Specimen: BLOOD RIGHT ARM  Result Value Ref Range Status   Specimen Description   Final    BLOOD RIGHT ARM Performed at [facility], [address], [place], [place] [address]    Special Requests   Final    BOTTLES DRAWN AEROBIC AND ANAEROBIC Blood Culture results may not be optimal due to an inadequate volume of blood received in culture bottles Performed at [facility], [address], [place], [place] [address]    Culture   Final    NO GROWTH 2 DAYS Performed at [facility], [address]  [place], [place] [address]    Report Status PENDING  Incomplete     Labs: BNP (last 3 results) Recent Labs    02/23/23 1520 03/14/23 2112  BNP 121.9* 127.9*   Basic Metabolic Panel: Recent Labs  Lab 03/14/23 2112 03/15/23 0404 03/16/23 0439 03/17/23 0711  NA 133* 136 135 137  K 4.0 3.9 3.9 3.4*  CL 103 107 111 111  CO2 21* 21* 20* 19*  GLUCOSE 114* 124* 85 80  BUN 40* 41* 32* 24*  CREATININE 1.84* 1.61* 1.35* 1.24*  CALCIUM  8.3* 7.8* 7.5* 7.7*  MG  --  1.4*  --   --    Liver Function Tests: Recent Labs  Lab 03/14/23 2112 03/15/23 0404  AST 14* 16  ALT 13 12  ALKPHOS 122 95  BILITOT 0.9 0.9  PROT 5.7* 4.8*  ALBUMIN 2.8* 1.8*   No results for input(s): "LIPASE", "AMYLASE" in the last 168 hours. No results for input(s): "AMMONIA" in the last 168 hours. CBC: Recent Labs  Lab 03/14/23 2112 03/15/23 0404 03/16/23 0439 03/17/23 0500  WBC 9.2 7.2 5.6  --   NEUTROABS 7.0 5.8  --   --   HGB 8.7* 7.7* 6.8* 9.1*  HCT 27.7* 25.1* 23.4* 28.6*  MCV 91.4 94.7 97.1  --   PLT 235 203 186  --    Cardiac Enzymes: Recent Labs  Lab 03/15/23 0404  CKTOTAL 19*   BNP: Invalid input(s): "POCBNP" CBG: No results for input(s): "GLUCAP" in the last 168 hours. D-Dimer No results for input(s): "DDIMER" in the last 72 hours. Hgb A1c No results for input(s): "HGBA1C" in the last 72 hours. Lipid Profile No results for input(s): "CHOL", "HDL", "LDLCALC", "TRIG", "CHOLHDL", "LDLDIRECT" in the last 72 hours. Thyroid function studies No results for input(s): "TSH", "T4TOTAL", "T3FREE", "THYROIDAB" in the last 72 hours.  Invalid input(s): "FREET3" Anemia work up Recent Labs    03/15/23 1202  VITAMINB12 3,869*  FOLATE 23.7  FERRITIN 513*  TIBC 160*  IRON 35  RETICCTPCT 5.0*   Urinalysis    Component Value Date/Time   COLORURINE YELLOW 03/16/2023 0816   APPEARANCEUR CLOUDY (A) 03/16/2023 0816   LABSPEC 1.024 03/16/2023 0816   PHURINE 5.0 03/16/2023 0816   GLUCOSEU  NEGATIVE 03/16/2023 0816   HGBUR NEGATIVE 03/16/2023 0816   BILIRUBINUR NEGATIVE 03/16/2023 0816   KETONESUR NEGATIVE 03/16/2023 0816   PROTEINUR NEGATIVE 03/16/2023 0816   UROBILINOGEN 0.2 10/07/2014 1830   NITRITE NEGATIVE 03/16/2023 0816   LEUKOCYTESUR TRACE (A) 03/16/2023 0816   Sepsis Labs Recent Labs  Lab 03/14/23 2112 03/15/23 0404 03/16/23 0439  WBC 9.2 7.2 5.6   Microbiology Recent Results (from the past 240 hours)  Culture, blood (Routine x 2)     Status: None (Preliminary result)   Collection Time: 03/14/23  9:12 PM   Specimen: BLOOD  Result Value Ref Range Status   Specimen Description   Final    BLOOD LEFT ANTECUBITAL Performed at [facility], [address] [place], [place] [address]    Special Requests   Final    BOTTLES DRAWN AEROBIC AND ANAEROBIC Blood Culture results may not be optimal due to an inadequate volume of blood received in culture bottles Performed at [facility], [address], [place], [place] [address]    Culture   Final    NO GROWTH 2 DAYS Performed at [facility], [address], [place], [place] [address]    Report Status PENDING  Incomplete  Culture, blood (Routine x 2)     Status: None (Preliminary result)   Collection Time: 03/15/23  4:04 AM   Specimen: BLOOD RIGHT ARM  Result Value Ref Range Status   Specimen Description   Final    BLOOD RIGHT ARM Performed at [facility], [address] [place], [place] [address]    Special Requests   Final    BOTTLES DRAWN AEROBIC AND ANAEROBIC Blood Culture results may not be optimal due to an inadequate volume of blood received in culture bottles Performed at  [facility], [address] [place], [place] [address]    Culture   Final    NO GROWTH 2 DAYS Performed at [facility], [address] [place], [place] [address]    Report Status PENDING  Incomplete     Time coordinating discharge: Over 30  minutes  SIGNED:   Alvira Philips [place], DO  [facility] 03/17/2023, 10:36 AM

## 2023-03-18 DIAGNOSIS — J4520 Mild intermittent asthma, uncomplicated: Secondary | ICD-10-CM | POA: Diagnosis not present

## 2023-03-18 DIAGNOSIS — J9611 Chronic respiratory failure with hypoxia: Secondary | ICD-10-CM | POA: Diagnosis not present

## 2023-03-18 DIAGNOSIS — I5032 Chronic diastolic (congestive) heart failure: Secondary | ICD-10-CM | POA: Diagnosis not present

## 2023-03-18 DIAGNOSIS — S80822D Blister (nonthermal), left lower leg, subsequent encounter: Secondary | ICD-10-CM | POA: Diagnosis not present

## 2023-03-18 DIAGNOSIS — I130 Hypertensive heart and chronic kidney disease with heart failure and stage 1 through stage 4 chronic kidney disease, or unspecified chronic kidney disease: Secondary | ICD-10-CM | POA: Diagnosis not present

## 2023-03-18 DIAGNOSIS — S90822D Blister (nonthermal), left foot, subsequent encounter: Secondary | ICD-10-CM | POA: Diagnosis not present

## 2023-03-18 DIAGNOSIS — L89312 Pressure ulcer of right buttock, stage 2: Secondary | ICD-10-CM | POA: Diagnosis not present

## 2023-03-18 DIAGNOSIS — S72142D Displaced intertrochanteric fracture of left femur, subsequent encounter for closed fracture with routine healing: Secondary | ICD-10-CM | POA: Diagnosis not present

## 2023-03-18 DIAGNOSIS — L03116 Cellulitis of left lower limb: Secondary | ICD-10-CM | POA: Diagnosis not present

## 2023-03-20 LAB — CULTURE, BLOOD (ROUTINE X 2)
Culture: NO GROWTH
Culture: NO GROWTH

## 2023-03-22 DIAGNOSIS — R609 Edema, unspecified: Secondary | ICD-10-CM | POA: Diagnosis not present

## 2023-03-22 DIAGNOSIS — D649 Anemia, unspecified: Secondary | ICD-10-CM | POA: Diagnosis not present

## 2023-03-22 DIAGNOSIS — N179 Acute kidney failure, unspecified: Secondary | ICD-10-CM | POA: Diagnosis not present

## 2023-03-22 DIAGNOSIS — L03116 Cellulitis of left lower limb: Secondary | ICD-10-CM | POA: Diagnosis not present

## 2023-03-22 DIAGNOSIS — L8990 Pressure ulcer of unspecified site, unspecified stage: Secondary | ICD-10-CM | POA: Diagnosis not present

## 2023-03-22 DIAGNOSIS — E871 Hypo-osmolality and hyponatremia: Secondary | ICD-10-CM | POA: Diagnosis not present

## 2023-03-23 ENCOUNTER — Other Ambulatory Visit: Payer: Self-pay | Admitting: Nurse Practitioner

## 2023-03-23 DIAGNOSIS — L03116 Cellulitis of left lower limb: Secondary | ICD-10-CM | POA: Diagnosis not present

## 2023-03-23 DIAGNOSIS — I5032 Chronic diastolic (congestive) heart failure: Secondary | ICD-10-CM | POA: Diagnosis not present

## 2023-03-23 DIAGNOSIS — S72142D Displaced intertrochanteric fracture of left femur, subsequent encounter for closed fracture with routine healing: Secondary | ICD-10-CM | POA: Diagnosis not present

## 2023-03-23 DIAGNOSIS — S90822D Blister (nonthermal), left foot, subsequent encounter: Secondary | ICD-10-CM | POA: Diagnosis not present

## 2023-03-23 DIAGNOSIS — S80822D Blister (nonthermal), left lower leg, subsequent encounter: Secondary | ICD-10-CM | POA: Diagnosis not present

## 2023-03-23 DIAGNOSIS — J4520 Mild intermittent asthma, uncomplicated: Secondary | ICD-10-CM | POA: Diagnosis not present

## 2023-03-23 DIAGNOSIS — I130 Hypertensive heart and chronic kidney disease with heart failure and stage 1 through stage 4 chronic kidney disease, or unspecified chronic kidney disease: Secondary | ICD-10-CM | POA: Diagnosis not present

## 2023-03-23 DIAGNOSIS — L89312 Pressure ulcer of right buttock, stage 2: Secondary | ICD-10-CM | POA: Diagnosis not present

## 2023-03-23 DIAGNOSIS — J9611 Chronic respiratory failure with hypoxia: Secondary | ICD-10-CM | POA: Diagnosis not present

## 2023-03-25 DIAGNOSIS — L03116 Cellulitis of left lower limb: Secondary | ICD-10-CM | POA: Diagnosis not present

## 2023-03-25 DIAGNOSIS — S72142D Displaced intertrochanteric fracture of left femur, subsequent encounter for closed fracture with routine healing: Secondary | ICD-10-CM | POA: Diagnosis not present

## 2023-03-25 DIAGNOSIS — S90822D Blister (nonthermal), left foot, subsequent encounter: Secondary | ICD-10-CM | POA: Diagnosis not present

## 2023-03-25 DIAGNOSIS — J4520 Mild intermittent asthma, uncomplicated: Secondary | ICD-10-CM | POA: Diagnosis not present

## 2023-03-25 DIAGNOSIS — J9611 Chronic respiratory failure with hypoxia: Secondary | ICD-10-CM | POA: Diagnosis not present

## 2023-03-25 DIAGNOSIS — I130 Hypertensive heart and chronic kidney disease with heart failure and stage 1 through stage 4 chronic kidney disease, or unspecified chronic kidney disease: Secondary | ICD-10-CM | POA: Diagnosis not present

## 2023-03-25 DIAGNOSIS — S80822D Blister (nonthermal), left lower leg, subsequent encounter: Secondary | ICD-10-CM | POA: Diagnosis not present

## 2023-03-25 DIAGNOSIS — L89312 Pressure ulcer of right buttock, stage 2: Secondary | ICD-10-CM | POA: Diagnosis not present

## 2023-03-25 DIAGNOSIS — I5032 Chronic diastolic (congestive) heart failure: Secondary | ICD-10-CM | POA: Diagnosis not present

## 2023-03-26 DIAGNOSIS — J9611 Chronic respiratory failure with hypoxia: Secondary | ICD-10-CM | POA: Diagnosis not present

## 2023-03-26 DIAGNOSIS — L03116 Cellulitis of left lower limb: Secondary | ICD-10-CM | POA: Diagnosis not present

## 2023-03-26 DIAGNOSIS — S72142D Displaced intertrochanteric fracture of left femur, subsequent encounter for closed fracture with routine healing: Secondary | ICD-10-CM | POA: Diagnosis not present

## 2023-03-26 DIAGNOSIS — J4520 Mild intermittent asthma, uncomplicated: Secondary | ICD-10-CM | POA: Diagnosis not present

## 2023-03-26 DIAGNOSIS — I5032 Chronic diastolic (congestive) heart failure: Secondary | ICD-10-CM | POA: Diagnosis not present

## 2023-03-26 DIAGNOSIS — L89312 Pressure ulcer of right buttock, stage 2: Secondary | ICD-10-CM | POA: Diagnosis not present

## 2023-03-26 DIAGNOSIS — I130 Hypertensive heart and chronic kidney disease with heart failure and stage 1 through stage 4 chronic kidney disease, or unspecified chronic kidney disease: Secondary | ICD-10-CM | POA: Diagnosis not present

## 2023-03-26 DIAGNOSIS — S80822D Blister (nonthermal), left lower leg, subsequent encounter: Secondary | ICD-10-CM | POA: Diagnosis not present

## 2023-03-26 DIAGNOSIS — S90822D Blister (nonthermal), left foot, subsequent encounter: Secondary | ICD-10-CM | POA: Diagnosis not present

## 2023-03-28 ENCOUNTER — Emergency Department (HOSPITAL_COMMUNITY)
Admission: EM | Admit: 2023-03-28 | Discharge: 2023-03-28 | Disposition: A | Discharge status: Home / Self Care | Attending: Emergency Medicine | Admitting: Emergency Medicine

## 2023-03-28 ENCOUNTER — Other Ambulatory Visit: Payer: Self-pay

## 2023-03-28 ENCOUNTER — Encounter (HOSPITAL_COMMUNITY): Payer: Self-pay | Admitting: Emergency Medicine

## 2023-03-28 DIAGNOSIS — Z4800 Encounter for change or removal of nonsurgical wound dressing: Secondary | ICD-10-CM | POA: Diagnosis not present

## 2023-03-28 DIAGNOSIS — Z7982 Long term (current) use of aspirin: Secondary | ICD-10-CM | POA: Diagnosis not present

## 2023-03-28 DIAGNOSIS — Z4801 Encounter for change or removal of surgical wound dressing: Secondary | ICD-10-CM | POA: Diagnosis not present

## 2023-03-28 DIAGNOSIS — Z5189 Encounter for other specified aftercare: Secondary | ICD-10-CM

## 2023-03-28 LAB — CBC
HCT: 32.7 % — ABNORMAL LOW (ref 36.0–46.0)
Hemoglobin: 10 g/dL — ABNORMAL LOW (ref 12.0–15.0)
MCH: 28.2 pg (ref 26.0–34.0)
MCHC: 30.6 g/dL (ref 30.0–36.0)
MCV: 92.1 fL (ref 80.0–100.0)
Platelets: 237 10*3/uL (ref 150–400)
RBC: 3.55 MIL/uL — ABNORMAL LOW (ref 3.87–5.11)
RDW: 16.8 % — ABNORMAL HIGH (ref 11.5–15.5)
WBC: 10.7 10*3/uL — ABNORMAL HIGH (ref 4.0–10.5)
nRBC: 0.2 % (ref 0.0–0.2)

## 2023-03-28 LAB — COMPREHENSIVE METABOLIC PANEL
ALT: 9 U/L (ref 0–44)
AST: 16 U/L (ref 15–41)
Albumin: 2.1 g/dL — ABNORMAL LOW (ref 3.5–5.0)
Alkaline Phosphatase: 78 U/L (ref 38–126)
Anion gap: 8 (ref 5–15)
BUN: 29 mg/dL — ABNORMAL HIGH (ref 8–23)
CO2: 22 mmol/L (ref 22–32)
Calcium: 7.9 mg/dL — ABNORMAL LOW (ref 8.9–10.3)
Chloride: 109 mmol/L (ref 98–111)
Creatinine, Ser: 1.23 mg/dL — ABNORMAL HIGH (ref 0.44–1.00)
GFR, Estimated: 42 mL/min — ABNORMAL LOW (ref >60)
Glucose, Bld: 90 mg/dL (ref 70–99)
Potassium: 4 mmol/L (ref 3.5–5.1)
Sodium: 139 mmol/L (ref 135–145)
Total Bilirubin: 0.7 mg/dL (ref 0.0–1.2)
Total Protein: 6 g/dL — ABNORMAL LOW (ref 6.5–8.1)

## 2023-03-28 MED ORDER — DOXYCYCLINE HYCLATE 100 MG PO CAPS: 100.0000 mg | ORAL_CAPSULE | Freq: Two times a day (BID) | ORAL | 0 refills | Status: DC

## 2023-03-28 NOTE — Discharge Instructions (Addendum)
 Call the wound care center tomorrow to schedule a follow-up visit.  Call your primary care doctor for information how to manage your lower extremity swelling.  Return here for fever, severe pain, red streaks going up your leg, or any other problems

## 2023-03-28 NOTE — ED Triage Notes (Signed)
 Pt c/o potential cellulitis in left foot. Pt just recently released from hospital on antibiotics but finished antibiotics past Thursday. Daughter concerned foot has gotten worst and antibiotics didn't work. Denies N/v/D. Pt currently taking lasix.

## 2023-03-28 NOTE — ED Provider Notes (Signed)
 [place] EMERGENCY DEPARTMENT AT [facility] Provider Note   CSN: [id number] Arrival date & time: 03/28/23  1142     History  Chief Complaint  Patient presents with   Wound Check    Sharon Bailey is a 89 y.o. female.  89 year old female presents with concern for nonhealing wound to patient's left foot.  Patient was discharged from hospital proximally 12 days ago after being admitted for cellulitis.  Family at bedside showed me the picture of patient's left foot at time of discharge and compared to now the foot looks remarkably better.  She just completed a course of Keflex a few days ago.  Denies any fever or chills.  No new numbness or tingling to her left foot.  Denies any drainage to the area.       Home Medications Prior to Admission medications   Medication Sig Start Date End Date Taking? Authorizing Provider  allopurinol (ZYLOPRIM) 300 MG tablet Take 0.5 tablets (150 mg total) by mouth daily. Patient taking differently: Take 300 mg by mouth daily. 02/10/23   Rai, Delene Ruffini, MD  aspirin EC 81 MG tablet Take 81 mg by mouth daily. Swallow whole.    [provider]  budesonide (ENTOCORT EC) 3 MG 24 hr capsule Take 1 capsule (3 mg total) by mouth daily. 03/23/23   Arnaldo Natal, NP  ferrous sulfate 325 (65 FE) MG EC tablet Take 325 mg by mouth daily with breakfast.    [provider]  levothyroxine (SYNTHROID) 88 MCG tablet Take 88 mcg by mouth in the morning.    [provider]  OXYGEN Inhale 2 L/min into the lungs as needed (for shortness of breath or chest pain).    [provider]  polyethylene glycol (MIRALAX / GLYCOLAX) 17 g packet Take 17 g by mouth daily as needed for moderate constipation. Patient taking differently: Take 17 g by mouth daily as needed (for constipation). 02/10/23   Rai, Delene Ruffini, MD  simvastatin (ZOCOR) 40 MG tablet Take 40 mg by mouth daily.    [provider]  traMADol (ULTRAM) 50 MG  tablet Take 1 tablet (50 mg total) by mouth every 8 (eight) hours as needed for severe pain (pain score 7-10). Patient taking differently: Take 50 mg by mouth daily as needed for severe pain (pain score 7-10). 02/10/23   Rai, Delene Ruffini, MD  vitamin B-12 1000 MCG tablet Take 1 tablet (1,000 mcg total) by mouth daily. 06/14/19   Joseph Art, DO      Allergies    Bactrim [sulfamethoxazole-trimethoprim] and Cipro [ciprofloxacin hcl]    Review of Systems   Review of Systems  All other systems reviewed and are negative.   Physical Exam Updated Vital Signs BP (!) 113/51   Pulse 83   Temp 98.5 F (36.9 C) (Oral)   Resp 17   Ht 1.626 m (5\' 4" )   Wt 65.8 kg   SpO2 98%   BMI 24.89 kg/m  Physical Exam Vitals and nursing note reviewed.  Constitutional:      General: She is not in acute distress.    Appearance: Normal appearance. She is well-developed. She is not toxic-appearing.  HENT:     Head: Normocephalic and atraumatic.  Eyes:     General: Lids are normal.     Conjunctiva/sclera: Conjunctivae normal.     Pupils: Pupils are equal, round, and reactive to light.  Neck:     Thyroid: No thyroid mass.  Trachea: No tracheal deviation.  Cardiovascular:     Rate and Rhythm: Normal rate and regular rhythm.     Heart sounds: Normal heart sounds. No murmur heard.    No gallop.  Pulmonary:     Effort: Pulmonary effort is normal. No respiratory distress.     Breath sounds: Normal breath sounds. No stridor. No decreased breath sounds, wheezing, rhonchi or rales.  Abdominal:     General: There is no distension.     Palpations: Abdomen is soft.     Tenderness: There is no abdominal tenderness. There is no rebound.  Musculoskeletal:        General: No tenderness. Normal range of motion.     Cervical back: Normal range of motion and neck supple.       Legs:  Skin:    General: Skin is warm and dry.     Findings: No abrasion or rash.  Neurological:     Mental Status: She is alert  and oriented to person, place, and time. Mental status is at baseline.     GCS: GCS eye subscore is 4. GCS verbal subscore is 5. GCS motor subscore is 6.     Cranial Nerves: No cranial nerve deficit.     Sensory: No sensory deficit.     Motor: Motor function is intact.  Psychiatric:        Attention and Perception: Attention normal.        Speech: Speech normal.        Behavior: Behavior normal.     ED Results / Procedures / Treatments   Labs (all labs ordered are listed, but only abnormal results are displayed) Labs Reviewed  COMPREHENSIVE METABOLIC PANEL - Abnormal; Notable for the following components:      Result Value   BUN 29 (*)    Creatinine, Ser 1.23 (*)    Calcium 7.9 (*)    Total Protein 6.0 (*)    Albumin 2.1 (*)    GFR, Estimated 42 (*)    All other components within normal limits  CBC - Abnormal; Notable for the following components:   WBC 10.7 (*)    RBC 3.55 (*)    Hemoglobin 10.0 (*)    HCT 32.7 (*)    RDW 16.8 (*)    All other components within normal limits    EKG None  Radiology No results found.  Procedures Procedures    Medications Ordered in ED Medications - No data to display  ED Course/ Medical Decision Making/ A&P                                 Medical Decision Making Amount and/or Complexity of Data Reviewed Labs: ordered.   Labs overall reassuring here.  Patient is neurovasc intact at patient's toes of her left foot.  As mentioned above, wound has greatly improved.  There is still does remain some erythema to the dorsal surface of her foot.  Possible tendon involvement on top of the left great toe.  Does have a granulating wound without evidence of cellulitis at the left lower extremity.  Plan will be to start doxycycline on patient.  Will give patient referral to wound care center.  Family notes that patient recently had her diuretics change in has had more increased lower EXTR edema.  Very low suspicion for DVT at this time.  No  evidence of basilar compromise.  Patient to follow-up with  PCP and wound care center.  Foot will be dressed prior to discharge        Final Clinical Impression(s) / ED Diagnoses Final diagnoses:  None    Rx / DC Orders ED Discharge Orders     None         Lorre Nick, MD 03/28/23 1352

## 2023-03-29 DIAGNOSIS — H353114 Nonexudative age-related macular degeneration, right eye, advanced atrophic with subfoveal involvement: Secondary | ICD-10-CM | POA: Diagnosis not present

## 2023-03-30 DIAGNOSIS — S72142D Displaced intertrochanteric fracture of left femur, subsequent encounter for closed fracture with routine healing: Secondary | ICD-10-CM | POA: Diagnosis not present

## 2023-03-30 DIAGNOSIS — L89312 Pressure ulcer of right buttock, stage 2: Secondary | ICD-10-CM | POA: Diagnosis not present

## 2023-03-30 DIAGNOSIS — S80822D Blister (nonthermal), left lower leg, subsequent encounter: Secondary | ICD-10-CM | POA: Diagnosis not present

## 2023-03-30 DIAGNOSIS — J4520 Mild intermittent asthma, uncomplicated: Secondary | ICD-10-CM | POA: Diagnosis not present

## 2023-03-30 DIAGNOSIS — I5032 Chronic diastolic (congestive) heart failure: Secondary | ICD-10-CM | POA: Diagnosis not present

## 2023-03-30 DIAGNOSIS — I130 Hypertensive heart and chronic kidney disease with heart failure and stage 1 through stage 4 chronic kidney disease, or unspecified chronic kidney disease: Secondary | ICD-10-CM | POA: Diagnosis not present

## 2023-03-30 DIAGNOSIS — S90822D Blister (nonthermal), left foot, subsequent encounter: Secondary | ICD-10-CM | POA: Diagnosis not present

## 2023-03-30 DIAGNOSIS — J9611 Chronic respiratory failure with hypoxia: Secondary | ICD-10-CM | POA: Diagnosis not present

## 2023-03-30 DIAGNOSIS — L03116 Cellulitis of left lower limb: Secondary | ICD-10-CM | POA: Diagnosis not present

## 2023-03-31 ENCOUNTER — Ambulatory Visit: Payer: Medicare PPO | Admitting: Podiatry

## 2023-03-31 DIAGNOSIS — J9611 Chronic respiratory failure with hypoxia: Secondary | ICD-10-CM | POA: Diagnosis not present

## 2023-03-31 DIAGNOSIS — I5032 Chronic diastolic (congestive) heart failure: Secondary | ICD-10-CM | POA: Diagnosis not present

## 2023-03-31 DIAGNOSIS — S90822D Blister (nonthermal), left foot, subsequent encounter: Secondary | ICD-10-CM | POA: Diagnosis not present

## 2023-03-31 DIAGNOSIS — L89312 Pressure ulcer of right buttock, stage 2: Secondary | ICD-10-CM | POA: Diagnosis not present

## 2023-03-31 DIAGNOSIS — L03116 Cellulitis of left lower limb: Secondary | ICD-10-CM | POA: Diagnosis not present

## 2023-03-31 DIAGNOSIS — I130 Hypertensive heart and chronic kidney disease with heart failure and stage 1 through stage 4 chronic kidney disease, or unspecified chronic kidney disease: Secondary | ICD-10-CM | POA: Diagnosis not present

## 2023-03-31 DIAGNOSIS — S72142D Displaced intertrochanteric fracture of left femur, subsequent encounter for closed fracture with routine healing: Secondary | ICD-10-CM | POA: Diagnosis not present

## 2023-03-31 DIAGNOSIS — S80822D Blister (nonthermal), left lower leg, subsequent encounter: Secondary | ICD-10-CM | POA: Diagnosis not present

## 2023-03-31 DIAGNOSIS — J4520 Mild intermittent asthma, uncomplicated: Secondary | ICD-10-CM | POA: Diagnosis not present

## 2023-04-02 DIAGNOSIS — L03116 Cellulitis of left lower limb: Secondary | ICD-10-CM | POA: Diagnosis not present

## 2023-04-02 DIAGNOSIS — I5032 Chronic diastolic (congestive) heart failure: Secondary | ICD-10-CM | POA: Diagnosis not present

## 2023-04-02 DIAGNOSIS — J9611 Chronic respiratory failure with hypoxia: Secondary | ICD-10-CM | POA: Diagnosis not present

## 2023-04-02 DIAGNOSIS — I130 Hypertensive heart and chronic kidney disease with heart failure and stage 1 through stage 4 chronic kidney disease, or unspecified chronic kidney disease: Secondary | ICD-10-CM | POA: Diagnosis not present

## 2023-04-02 DIAGNOSIS — L89312 Pressure ulcer of right buttock, stage 2: Secondary | ICD-10-CM | POA: Diagnosis not present

## 2023-04-02 DIAGNOSIS — S80822D Blister (nonthermal), left lower leg, subsequent encounter: Secondary | ICD-10-CM | POA: Diagnosis not present

## 2023-04-02 DIAGNOSIS — J4520 Mild intermittent asthma, uncomplicated: Secondary | ICD-10-CM | POA: Diagnosis not present

## 2023-04-02 DIAGNOSIS — S72142D Displaced intertrochanteric fracture of left femur, subsequent encounter for closed fracture with routine healing: Secondary | ICD-10-CM | POA: Diagnosis not present

## 2023-04-02 DIAGNOSIS — S90822D Blister (nonthermal), left foot, subsequent encounter: Secondary | ICD-10-CM | POA: Diagnosis not present

## 2023-04-02 DIAGNOSIS — N183 Chronic kidney disease, stage 3 unspecified: Secondary | ICD-10-CM | POA: Diagnosis not present

## 2023-04-05 DIAGNOSIS — S72142D Displaced intertrochanteric fracture of left femur, subsequent encounter for closed fracture with routine healing: Secondary | ICD-10-CM | POA: Diagnosis not present

## 2023-04-07 DIAGNOSIS — J9611 Chronic respiratory failure with hypoxia: Secondary | ICD-10-CM | POA: Diagnosis not present

## 2023-04-07 DIAGNOSIS — S80822D Blister (nonthermal), left lower leg, subsequent encounter: Secondary | ICD-10-CM | POA: Diagnosis not present

## 2023-04-07 DIAGNOSIS — S72142D Displaced intertrochanteric fracture of left femur, subsequent encounter for closed fracture with routine healing: Secondary | ICD-10-CM | POA: Diagnosis not present

## 2023-04-07 DIAGNOSIS — I130 Hypertensive heart and chronic kidney disease with heart failure and stage 1 through stage 4 chronic kidney disease, or unspecified chronic kidney disease: Secondary | ICD-10-CM | POA: Diagnosis not present

## 2023-04-07 DIAGNOSIS — I5032 Chronic diastolic (congestive) heart failure: Secondary | ICD-10-CM | POA: Diagnosis not present

## 2023-04-07 DIAGNOSIS — L03116 Cellulitis of left lower limb: Secondary | ICD-10-CM | POA: Diagnosis not present

## 2023-04-07 DIAGNOSIS — L89312 Pressure ulcer of right buttock, stage 2: Secondary | ICD-10-CM | POA: Diagnosis not present

## 2023-04-07 DIAGNOSIS — I10 Essential (primary) hypertension: Secondary | ICD-10-CM | POA: Diagnosis not present

## 2023-04-07 DIAGNOSIS — J4520 Mild intermittent asthma, uncomplicated: Secondary | ICD-10-CM | POA: Diagnosis not present

## 2023-04-07 DIAGNOSIS — S90822D Blister (nonthermal), left foot, subsequent encounter: Secondary | ICD-10-CM | POA: Diagnosis not present

## 2023-04-09 DIAGNOSIS — L89312 Pressure ulcer of right buttock, stage 2: Secondary | ICD-10-CM | POA: Diagnosis not present

## 2023-04-09 DIAGNOSIS — J4520 Mild intermittent asthma, uncomplicated: Secondary | ICD-10-CM | POA: Diagnosis not present

## 2023-04-09 DIAGNOSIS — J9611 Chronic respiratory failure with hypoxia: Secondary | ICD-10-CM | POA: Diagnosis not present

## 2023-04-09 DIAGNOSIS — S72142D Displaced intertrochanteric fracture of left femur, subsequent encounter for closed fracture with routine healing: Secondary | ICD-10-CM | POA: Diagnosis not present

## 2023-04-09 DIAGNOSIS — S90822D Blister (nonthermal), left foot, subsequent encounter: Secondary | ICD-10-CM | POA: Diagnosis not present

## 2023-04-09 DIAGNOSIS — I5032 Chronic diastolic (congestive) heart failure: Secondary | ICD-10-CM | POA: Diagnosis not present

## 2023-04-09 DIAGNOSIS — S80822D Blister (nonthermal), left lower leg, subsequent encounter: Secondary | ICD-10-CM | POA: Diagnosis not present

## 2023-04-09 DIAGNOSIS — L03116 Cellulitis of left lower limb: Secondary | ICD-10-CM | POA: Diagnosis not present

## 2023-04-09 DIAGNOSIS — I130 Hypertensive heart and chronic kidney disease with heart failure and stage 1 through stage 4 chronic kidney disease, or unspecified chronic kidney disease: Secondary | ICD-10-CM | POA: Diagnosis not present

## 2023-04-12 DIAGNOSIS — S91302A Unspecified open wound, left foot, initial encounter: Secondary | ICD-10-CM | POA: Diagnosis not present

## 2023-04-12 DIAGNOSIS — L03116 Cellulitis of left lower limb: Secondary | ICD-10-CM | POA: Diagnosis not present

## 2023-04-12 DIAGNOSIS — S81802A Unspecified open wound, left lower leg, initial encounter: Secondary | ICD-10-CM | POA: Diagnosis not present

## 2023-04-13 ENCOUNTER — Encounter (HOSPITAL_BASED_OUTPATIENT_CLINIC_OR_DEPARTMENT_OTHER): Attending: General Surgery | Admitting: General Surgery

## 2023-04-13 DIAGNOSIS — E43 Unspecified severe protein-calorie malnutrition: Secondary | ICD-10-CM | POA: Diagnosis not present

## 2023-04-13 DIAGNOSIS — I872 Venous insufficiency (chronic) (peripheral): Secondary | ICD-10-CM | POA: Insufficient documentation

## 2023-04-13 DIAGNOSIS — L89623 Pressure ulcer of left heel, stage 3: Secondary | ICD-10-CM | POA: Diagnosis not present

## 2023-04-13 DIAGNOSIS — L97523 Non-pressure chronic ulcer of other part of left foot with necrosis of muscle: Secondary | ICD-10-CM | POA: Insufficient documentation

## 2023-04-13 DIAGNOSIS — L97812 Non-pressure chronic ulcer of other part of right lower leg with fat layer exposed: Secondary | ICD-10-CM | POA: Diagnosis not present

## 2023-04-13 DIAGNOSIS — I890 Lymphedema, not elsewhere classified: Secondary | ICD-10-CM | POA: Insufficient documentation

## 2023-04-13 DIAGNOSIS — L97522 Non-pressure chronic ulcer of other part of left foot with fat layer exposed: Secondary | ICD-10-CM | POA: Insufficient documentation

## 2023-04-13 DIAGNOSIS — N183 Chronic kidney disease, stage 3 unspecified: Secondary | ICD-10-CM | POA: Diagnosis not present

## 2023-04-13 DIAGNOSIS — L97822 Non-pressure chronic ulcer of other part of left lower leg with fat layer exposed: Secondary | ICD-10-CM | POA: Diagnosis not present

## 2023-04-13 DIAGNOSIS — I771 Stricture of artery: Secondary | ICD-10-CM | POA: Diagnosis not present

## 2023-04-16 DIAGNOSIS — S72142D Displaced intertrochanteric fracture of left femur, subsequent encounter for closed fracture with routine healing: Secondary | ICD-10-CM | POA: Diagnosis not present

## 2023-04-16 DIAGNOSIS — L03116 Cellulitis of left lower limb: Secondary | ICD-10-CM | POA: Diagnosis not present

## 2023-04-16 DIAGNOSIS — J4520 Mild intermittent asthma, uncomplicated: Secondary | ICD-10-CM | POA: Diagnosis not present

## 2023-04-16 DIAGNOSIS — I5032 Chronic diastolic (congestive) heart failure: Secondary | ICD-10-CM | POA: Diagnosis not present

## 2023-04-16 DIAGNOSIS — L89312 Pressure ulcer of right buttock, stage 2: Secondary | ICD-10-CM | POA: Diagnosis not present

## 2023-04-16 DIAGNOSIS — I130 Hypertensive heart and chronic kidney disease with heart failure and stage 1 through stage 4 chronic kidney disease, or unspecified chronic kidney disease: Secondary | ICD-10-CM | POA: Diagnosis not present

## 2023-04-16 DIAGNOSIS — S90822D Blister (nonthermal), left foot, subsequent encounter: Secondary | ICD-10-CM | POA: Diagnosis not present

## 2023-04-16 DIAGNOSIS — S80822D Blister (nonthermal), left lower leg, subsequent encounter: Secondary | ICD-10-CM | POA: Diagnosis not present

## 2023-04-16 DIAGNOSIS — J9611 Chronic respiratory failure with hypoxia: Secondary | ICD-10-CM | POA: Diagnosis not present

## 2023-04-17 DIAGNOSIS — S80822D Blister (nonthermal), left lower leg, subsequent encounter: Secondary | ICD-10-CM | POA: Diagnosis not present

## 2023-04-17 DIAGNOSIS — S72142D Displaced intertrochanteric fracture of left femur, subsequent encounter for closed fracture with routine healing: Secondary | ICD-10-CM | POA: Diagnosis not present

## 2023-04-17 DIAGNOSIS — J4520 Mild intermittent asthma, uncomplicated: Secondary | ICD-10-CM | POA: Diagnosis not present

## 2023-04-17 DIAGNOSIS — J9611 Chronic respiratory failure with hypoxia: Secondary | ICD-10-CM | POA: Diagnosis not present

## 2023-04-17 DIAGNOSIS — I130 Hypertensive heart and chronic kidney disease with heart failure and stage 1 through stage 4 chronic kidney disease, or unspecified chronic kidney disease: Secondary | ICD-10-CM | POA: Diagnosis not present

## 2023-04-17 DIAGNOSIS — S90822D Blister (nonthermal), left foot, subsequent encounter: Secondary | ICD-10-CM | POA: Diagnosis not present

## 2023-04-17 DIAGNOSIS — I5032 Chronic diastolic (congestive) heart failure: Secondary | ICD-10-CM | POA: Diagnosis not present

## 2023-04-17 DIAGNOSIS — L89312 Pressure ulcer of right buttock, stage 2: Secondary | ICD-10-CM | POA: Diagnosis not present

## 2023-04-17 DIAGNOSIS — L03116 Cellulitis of left lower limb: Secondary | ICD-10-CM | POA: Diagnosis not present

## 2023-04-19 DIAGNOSIS — I5032 Chronic diastolic (congestive) heart failure: Secondary | ICD-10-CM | POA: Diagnosis not present

## 2023-04-19 DIAGNOSIS — S72142D Displaced intertrochanteric fracture of left femur, subsequent encounter for closed fracture with routine healing: Secondary | ICD-10-CM | POA: Diagnosis not present

## 2023-04-19 DIAGNOSIS — J9611 Chronic respiratory failure with hypoxia: Secondary | ICD-10-CM | POA: Diagnosis not present

## 2023-04-19 DIAGNOSIS — L89312 Pressure ulcer of right buttock, stage 2: Secondary | ICD-10-CM | POA: Diagnosis not present

## 2023-04-19 DIAGNOSIS — L03116 Cellulitis of left lower limb: Secondary | ICD-10-CM | POA: Diagnosis not present

## 2023-04-19 DIAGNOSIS — J4520 Mild intermittent asthma, uncomplicated: Secondary | ICD-10-CM | POA: Diagnosis not present

## 2023-04-19 DIAGNOSIS — S90822D Blister (nonthermal), left foot, subsequent encounter: Secondary | ICD-10-CM | POA: Diagnosis not present

## 2023-04-19 DIAGNOSIS — S80822D Blister (nonthermal), left lower leg, subsequent encounter: Secondary | ICD-10-CM | POA: Diagnosis not present

## 2023-04-19 DIAGNOSIS — I130 Hypertensive heart and chronic kidney disease with heart failure and stage 1 through stage 4 chronic kidney disease, or unspecified chronic kidney disease: Secondary | ICD-10-CM | POA: Diagnosis not present

## 2023-04-21 ENCOUNTER — Encounter (HOSPITAL_BASED_OUTPATIENT_CLINIC_OR_DEPARTMENT_OTHER): Admitting: General Surgery

## 2023-04-21 DIAGNOSIS — E43 Unspecified severe protein-calorie malnutrition: Secondary | ICD-10-CM | POA: Diagnosis not present

## 2023-04-21 DIAGNOSIS — L97822 Non-pressure chronic ulcer of other part of left lower leg with fat layer exposed: Secondary | ICD-10-CM | POA: Diagnosis not present

## 2023-04-21 DIAGNOSIS — N183 Chronic kidney disease, stage 3 unspecified: Secondary | ICD-10-CM | POA: Diagnosis not present

## 2023-04-21 DIAGNOSIS — L97523 Non-pressure chronic ulcer of other part of left foot with necrosis of muscle: Secondary | ICD-10-CM | POA: Diagnosis not present

## 2023-04-21 DIAGNOSIS — L89623 Pressure ulcer of left heel, stage 3: Secondary | ICD-10-CM | POA: Diagnosis not present

## 2023-04-21 DIAGNOSIS — I872 Venous insufficiency (chronic) (peripheral): Secondary | ICD-10-CM | POA: Diagnosis not present

## 2023-04-21 DIAGNOSIS — L97522 Non-pressure chronic ulcer of other part of left foot with fat layer exposed: Secondary | ICD-10-CM | POA: Diagnosis not present

## 2023-04-21 DIAGNOSIS — I890 Lymphedema, not elsewhere classified: Secondary | ICD-10-CM | POA: Diagnosis not present

## 2023-04-21 DIAGNOSIS — L97812 Non-pressure chronic ulcer of other part of right lower leg with fat layer exposed: Secondary | ICD-10-CM | POA: Diagnosis not present

## 2023-04-22 DIAGNOSIS — J9611 Chronic respiratory failure with hypoxia: Secondary | ICD-10-CM | POA: Diagnosis not present

## 2023-04-22 DIAGNOSIS — S90822D Blister (nonthermal), left foot, subsequent encounter: Secondary | ICD-10-CM | POA: Diagnosis not present

## 2023-04-22 DIAGNOSIS — L89312 Pressure ulcer of right buttock, stage 2: Secondary | ICD-10-CM | POA: Diagnosis not present

## 2023-04-22 DIAGNOSIS — I130 Hypertensive heart and chronic kidney disease with heart failure and stage 1 through stage 4 chronic kidney disease, or unspecified chronic kidney disease: Secondary | ICD-10-CM | POA: Diagnosis not present

## 2023-04-22 DIAGNOSIS — S80822D Blister (nonthermal), left lower leg, subsequent encounter: Secondary | ICD-10-CM | POA: Diagnosis not present

## 2023-04-22 DIAGNOSIS — J4520 Mild intermittent asthma, uncomplicated: Secondary | ICD-10-CM | POA: Diagnosis not present

## 2023-04-22 DIAGNOSIS — I5032 Chronic diastolic (congestive) heart failure: Secondary | ICD-10-CM | POA: Diagnosis not present

## 2023-04-22 DIAGNOSIS — L03116 Cellulitis of left lower limb: Secondary | ICD-10-CM | POA: Diagnosis not present

## 2023-04-22 DIAGNOSIS — S72142D Displaced intertrochanteric fracture of left femur, subsequent encounter for closed fracture with routine healing: Secondary | ICD-10-CM | POA: Diagnosis not present

## 2023-04-23 DIAGNOSIS — L03116 Cellulitis of left lower limb: Secondary | ICD-10-CM | POA: Diagnosis not present

## 2023-04-23 DIAGNOSIS — L89312 Pressure ulcer of right buttock, stage 2: Secondary | ICD-10-CM | POA: Diagnosis not present

## 2023-04-23 DIAGNOSIS — J4520 Mild intermittent asthma, uncomplicated: Secondary | ICD-10-CM | POA: Diagnosis not present

## 2023-04-23 DIAGNOSIS — I130 Hypertensive heart and chronic kidney disease with heart failure and stage 1 through stage 4 chronic kidney disease, or unspecified chronic kidney disease: Secondary | ICD-10-CM | POA: Diagnosis not present

## 2023-04-23 DIAGNOSIS — I5032 Chronic diastolic (congestive) heart failure: Secondary | ICD-10-CM | POA: Diagnosis not present

## 2023-04-23 DIAGNOSIS — S72142D Displaced intertrochanteric fracture of left femur, subsequent encounter for closed fracture with routine healing: Secondary | ICD-10-CM | POA: Diagnosis not present

## 2023-04-23 DIAGNOSIS — S90822D Blister (nonthermal), left foot, subsequent encounter: Secondary | ICD-10-CM | POA: Diagnosis not present

## 2023-04-23 DIAGNOSIS — J9611 Chronic respiratory failure with hypoxia: Secondary | ICD-10-CM | POA: Diagnosis not present

## 2023-04-23 DIAGNOSIS — S80822D Blister (nonthermal), left lower leg, subsequent encounter: Secondary | ICD-10-CM | POA: Diagnosis not present

## 2023-04-30 ENCOUNTER — Encounter (HOSPITAL_BASED_OUTPATIENT_CLINIC_OR_DEPARTMENT_OTHER): Admitting: General Surgery

## 2023-04-30 DIAGNOSIS — L89312 Pressure ulcer of right buttock, stage 2: Secondary | ICD-10-CM | POA: Diagnosis not present

## 2023-04-30 DIAGNOSIS — I130 Hypertensive heart and chronic kidney disease with heart failure and stage 1 through stage 4 chronic kidney disease, or unspecified chronic kidney disease: Secondary | ICD-10-CM | POA: Diagnosis not present

## 2023-04-30 DIAGNOSIS — S90822D Blister (nonthermal), left foot, subsequent encounter: Secondary | ICD-10-CM | POA: Diagnosis not present

## 2023-04-30 DIAGNOSIS — J9611 Chronic respiratory failure with hypoxia: Secondary | ICD-10-CM | POA: Diagnosis not present

## 2023-04-30 DIAGNOSIS — L97822 Non-pressure chronic ulcer of other part of left lower leg with fat layer exposed: Secondary | ICD-10-CM | POA: Diagnosis not present

## 2023-04-30 DIAGNOSIS — N183 Chronic kidney disease, stage 3 unspecified: Secondary | ICD-10-CM | POA: Diagnosis not present

## 2023-04-30 DIAGNOSIS — J4520 Mild intermittent asthma, uncomplicated: Secondary | ICD-10-CM | POA: Diagnosis not present

## 2023-04-30 DIAGNOSIS — I890 Lymphedema, not elsewhere classified: Secondary | ICD-10-CM | POA: Diagnosis not present

## 2023-04-30 DIAGNOSIS — L97522 Non-pressure chronic ulcer of other part of left foot with fat layer exposed: Secondary | ICD-10-CM | POA: Diagnosis not present

## 2023-04-30 DIAGNOSIS — I872 Venous insufficiency (chronic) (peripheral): Secondary | ICD-10-CM | POA: Diagnosis not present

## 2023-04-30 DIAGNOSIS — L97812 Non-pressure chronic ulcer of other part of right lower leg with fat layer exposed: Secondary | ICD-10-CM | POA: Diagnosis not present

## 2023-04-30 DIAGNOSIS — L89623 Pressure ulcer of left heel, stage 3: Secondary | ICD-10-CM | POA: Diagnosis not present

## 2023-04-30 DIAGNOSIS — I5032 Chronic diastolic (congestive) heart failure: Secondary | ICD-10-CM | POA: Diagnosis not present

## 2023-04-30 DIAGNOSIS — S80822D Blister (nonthermal), left lower leg, subsequent encounter: Secondary | ICD-10-CM | POA: Diagnosis not present

## 2023-04-30 DIAGNOSIS — L97523 Non-pressure chronic ulcer of other part of left foot with necrosis of muscle: Secondary | ICD-10-CM | POA: Diagnosis not present

## 2023-04-30 DIAGNOSIS — E43 Unspecified severe protein-calorie malnutrition: Secondary | ICD-10-CM | POA: Diagnosis not present

## 2023-04-30 DIAGNOSIS — L03116 Cellulitis of left lower limb: Secondary | ICD-10-CM | POA: Diagnosis not present

## 2023-04-30 DIAGNOSIS — S72142D Displaced intertrochanteric fracture of left femur, subsequent encounter for closed fracture with routine healing: Secondary | ICD-10-CM | POA: Diagnosis not present

## 2023-05-03 DIAGNOSIS — J9611 Chronic respiratory failure with hypoxia: Secondary | ICD-10-CM | POA: Diagnosis not present

## 2023-05-03 DIAGNOSIS — S90822D Blister (nonthermal), left foot, subsequent encounter: Secondary | ICD-10-CM | POA: Diagnosis not present

## 2023-05-03 DIAGNOSIS — L03116 Cellulitis of left lower limb: Secondary | ICD-10-CM | POA: Diagnosis not present

## 2023-05-03 DIAGNOSIS — S80822D Blister (nonthermal), left lower leg, subsequent encounter: Secondary | ICD-10-CM | POA: Diagnosis not present

## 2023-05-03 DIAGNOSIS — I130 Hypertensive heart and chronic kidney disease with heart failure and stage 1 through stage 4 chronic kidney disease, or unspecified chronic kidney disease: Secondary | ICD-10-CM | POA: Diagnosis not present

## 2023-05-03 DIAGNOSIS — I5032 Chronic diastolic (congestive) heart failure: Secondary | ICD-10-CM | POA: Diagnosis not present

## 2023-05-03 DIAGNOSIS — S72142D Displaced intertrochanteric fracture of left femur, subsequent encounter for closed fracture with routine healing: Secondary | ICD-10-CM | POA: Diagnosis not present

## 2023-05-03 DIAGNOSIS — L89312 Pressure ulcer of right buttock, stage 2: Secondary | ICD-10-CM | POA: Diagnosis not present

## 2023-05-03 DIAGNOSIS — J4520 Mild intermittent asthma, uncomplicated: Secondary | ICD-10-CM | POA: Diagnosis not present

## 2023-05-05 ENCOUNTER — Encounter (HOSPITAL_BASED_OUTPATIENT_CLINIC_OR_DEPARTMENT_OTHER): Admitting: General Surgery

## 2023-05-05 DIAGNOSIS — S81811A Laceration without foreign body, right lower leg, initial encounter: Secondary | ICD-10-CM | POA: Diagnosis not present

## 2023-05-05 DIAGNOSIS — L97523 Non-pressure chronic ulcer of other part of left foot with necrosis of muscle: Secondary | ICD-10-CM | POA: Diagnosis not present

## 2023-05-05 DIAGNOSIS — L97822 Non-pressure chronic ulcer of other part of left lower leg with fat layer exposed: Secondary | ICD-10-CM | POA: Diagnosis not present

## 2023-05-05 DIAGNOSIS — N183 Chronic kidney disease, stage 3 unspecified: Secondary | ICD-10-CM | POA: Diagnosis not present

## 2023-05-05 DIAGNOSIS — L89623 Pressure ulcer of left heel, stage 3: Secondary | ICD-10-CM | POA: Diagnosis not present

## 2023-05-05 DIAGNOSIS — I890 Lymphedema, not elsewhere classified: Secondary | ICD-10-CM | POA: Diagnosis not present

## 2023-05-05 DIAGNOSIS — L97522 Non-pressure chronic ulcer of other part of left foot with fat layer exposed: Secondary | ICD-10-CM | POA: Diagnosis not present

## 2023-05-05 DIAGNOSIS — S81011A Laceration without foreign body, right knee, initial encounter: Secondary | ICD-10-CM | POA: Diagnosis not present

## 2023-05-05 DIAGNOSIS — E43 Unspecified severe protein-calorie malnutrition: Secondary | ICD-10-CM | POA: Diagnosis not present

## 2023-05-05 DIAGNOSIS — L97812 Non-pressure chronic ulcer of other part of right lower leg with fat layer exposed: Secondary | ICD-10-CM | POA: Diagnosis not present

## 2023-05-05 DIAGNOSIS — I872 Venous insufficiency (chronic) (peripheral): Secondary | ICD-10-CM | POA: Diagnosis not present

## 2023-05-06 DIAGNOSIS — J4520 Mild intermittent asthma, uncomplicated: Secondary | ICD-10-CM | POA: Diagnosis not present

## 2023-05-06 DIAGNOSIS — I5032 Chronic diastolic (congestive) heart failure: Secondary | ICD-10-CM | POA: Diagnosis not present

## 2023-05-06 DIAGNOSIS — J9611 Chronic respiratory failure with hypoxia: Secondary | ICD-10-CM | POA: Diagnosis not present

## 2023-05-06 DIAGNOSIS — L03116 Cellulitis of left lower limb: Secondary | ICD-10-CM | POA: Diagnosis not present

## 2023-05-06 DIAGNOSIS — I130 Hypertensive heart and chronic kidney disease with heart failure and stage 1 through stage 4 chronic kidney disease, or unspecified chronic kidney disease: Secondary | ICD-10-CM | POA: Diagnosis not present

## 2023-05-06 DIAGNOSIS — S72142D Displaced intertrochanteric fracture of left femur, subsequent encounter for closed fracture with routine healing: Secondary | ICD-10-CM | POA: Diagnosis not present

## 2023-05-06 DIAGNOSIS — S80822D Blister (nonthermal), left lower leg, subsequent encounter: Secondary | ICD-10-CM | POA: Diagnosis not present

## 2023-05-06 DIAGNOSIS — L89312 Pressure ulcer of right buttock, stage 2: Secondary | ICD-10-CM | POA: Diagnosis not present

## 2023-05-06 DIAGNOSIS — S90822D Blister (nonthermal), left foot, subsequent encounter: Secondary | ICD-10-CM | POA: Diagnosis not present

## 2023-05-07 DIAGNOSIS — S90822D Blister (nonthermal), left foot, subsequent encounter: Secondary | ICD-10-CM | POA: Diagnosis not present

## 2023-05-07 DIAGNOSIS — I130 Hypertensive heart and chronic kidney disease with heart failure and stage 1 through stage 4 chronic kidney disease, or unspecified chronic kidney disease: Secondary | ICD-10-CM | POA: Diagnosis not present

## 2023-05-07 DIAGNOSIS — J4520 Mild intermittent asthma, uncomplicated: Secondary | ICD-10-CM | POA: Diagnosis not present

## 2023-05-07 DIAGNOSIS — J9611 Chronic respiratory failure with hypoxia: Secondary | ICD-10-CM | POA: Diagnosis not present

## 2023-05-07 DIAGNOSIS — S72142D Displaced intertrochanteric fracture of left femur, subsequent encounter for closed fracture with routine healing: Secondary | ICD-10-CM | POA: Diagnosis not present

## 2023-05-07 DIAGNOSIS — L03116 Cellulitis of left lower limb: Secondary | ICD-10-CM | POA: Diagnosis not present

## 2023-05-07 DIAGNOSIS — I5032 Chronic diastolic (congestive) heart failure: Secondary | ICD-10-CM | POA: Diagnosis not present

## 2023-05-07 DIAGNOSIS — S80822D Blister (nonthermal), left lower leg, subsequent encounter: Secondary | ICD-10-CM | POA: Diagnosis not present

## 2023-05-07 DIAGNOSIS — L89312 Pressure ulcer of right buttock, stage 2: Secondary | ICD-10-CM | POA: Diagnosis not present

## 2023-05-11 ENCOUNTER — Encounter: Payer: Self-pay | Admitting: Podiatry

## 2023-05-11 ENCOUNTER — Ambulatory Visit: Admitting: Podiatry

## 2023-05-11 DIAGNOSIS — I739 Peripheral vascular disease, unspecified: Secondary | ICD-10-CM

## 2023-05-11 DIAGNOSIS — M79674 Pain in right toe(s): Secondary | ICD-10-CM

## 2023-05-11 DIAGNOSIS — N183 Chronic kidney disease, stage 3 unspecified: Secondary | ICD-10-CM

## 2023-05-11 DIAGNOSIS — B351 Tinea unguium: Secondary | ICD-10-CM | POA: Diagnosis not present

## 2023-05-11 DIAGNOSIS — M79675 Pain in left toe(s): Secondary | ICD-10-CM | POA: Diagnosis not present

## 2023-05-11 NOTE — Progress Notes (Signed)
 This patient returns to my office for at risk foot care.  This patient requires this care by a professional since this patient will be at risk due to having PAD and CKD.  This patient is unable to cut nails herself since the patient cannot reach her nails.These nails are painful walking and wearing shoes.  This patient presents for at risk foot care today. She is accompanied by her daughter.  General Appearance  Alert, conversant and in no acute stress.  Vascular  Dorsalis pedis and posterior tibial  pulses are weakly  palpable  bilaterally.  Capillary return is within normal limits  bilaterally. Temperature is within normal limits  bilaterally.  Neurologic  Senn-Weinstein monofilament wire test within normal limits  bilaterally. Muscle power within normal limits bilaterally.  Nails Thick disfigured discolored nails with subungual debris  from hallux to fifth toes bilaterally. No evidence of bacterial infection or drainage bilaterally.  Orthopedic  No limitations of motion  feet .  No crepitus or effusions noted.  No bony pathology or digital deformities noted.  Skin  normotropic skin with no porokeratosis noted bilaterally.  No signs of infections or ulcers noted.     Onychomycosis  Pain in right toes  Pain in left toes  Consent was obtained for treatment procedures.   Mechanical debridement of nails 1-5  bilaterally performed with a nail nipper.  Filed with dremel without incident.    Return office visit    4 months                  Told patient to return for periodic foot care and evaluation due to potential at risk complications.   Ruffin Cotton DPM

## 2023-05-12 ENCOUNTER — Encounter: Payer: Self-pay | Admitting: Oncology

## 2023-05-12 ENCOUNTER — Inpatient Hospital Stay: Payer: Medicare PPO | Admitting: Oncology

## 2023-05-12 ENCOUNTER — Encounter (HOSPITAL_BASED_OUTPATIENT_CLINIC_OR_DEPARTMENT_OTHER): Attending: General Surgery | Admitting: General Surgery

## 2023-05-12 ENCOUNTER — Inpatient Hospital Stay: Payer: Medicare PPO | Attending: Oncology

## 2023-05-12 VITALS — BP 193/92 | HR 94 | Temp 97.3°F | Resp 19 | Wt 137.3 lb

## 2023-05-12 DIAGNOSIS — Z6825 Body mass index (BMI) 25.0-25.9, adult: Secondary | ICD-10-CM | POA: Diagnosis not present

## 2023-05-12 DIAGNOSIS — L89623 Pressure ulcer of left heel, stage 3: Secondary | ICD-10-CM | POA: Insufficient documentation

## 2023-05-12 DIAGNOSIS — I890 Lymphedema, not elsewhere classified: Secondary | ICD-10-CM | POA: Insufficient documentation

## 2023-05-12 DIAGNOSIS — L97523 Non-pressure chronic ulcer of other part of left foot with necrosis of muscle: Secondary | ICD-10-CM | POA: Insufficient documentation

## 2023-05-12 DIAGNOSIS — Z79899 Other long term (current) drug therapy: Secondary | ICD-10-CM | POA: Diagnosis not present

## 2023-05-12 DIAGNOSIS — D631 Anemia in chronic kidney disease: Secondary | ICD-10-CM | POA: Insufficient documentation

## 2023-05-12 DIAGNOSIS — L97812 Non-pressure chronic ulcer of other part of right lower leg with fat layer exposed: Secondary | ICD-10-CM | POA: Insufficient documentation

## 2023-05-12 DIAGNOSIS — N183 Chronic kidney disease, stage 3 unspecified: Secondary | ICD-10-CM | POA: Diagnosis not present

## 2023-05-12 DIAGNOSIS — E43 Unspecified severe protein-calorie malnutrition: Secondary | ICD-10-CM | POA: Insufficient documentation

## 2023-05-12 DIAGNOSIS — I872 Venous insufficiency (chronic) (peripheral): Secondary | ICD-10-CM | POA: Insufficient documentation

## 2023-05-12 DIAGNOSIS — K52831 Collagenous colitis: Secondary | ICD-10-CM

## 2023-05-12 DIAGNOSIS — I129 Hypertensive chronic kidney disease with stage 1 through stage 4 chronic kidney disease, or unspecified chronic kidney disease: Secondary | ICD-10-CM | POA: Insufficient documentation

## 2023-05-12 DIAGNOSIS — D500 Iron deficiency anemia secondary to blood loss (chronic): Secondary | ICD-10-CM | POA: Diagnosis not present

## 2023-05-12 DIAGNOSIS — D509 Iron deficiency anemia, unspecified: Secondary | ICD-10-CM | POA: Insufficient documentation

## 2023-05-12 DIAGNOSIS — L97522 Non-pressure chronic ulcer of other part of left foot with fat layer exposed: Secondary | ICD-10-CM | POA: Insufficient documentation

## 2023-05-12 DIAGNOSIS — Z7982 Long term (current) use of aspirin: Secondary | ICD-10-CM | POA: Diagnosis not present

## 2023-05-12 DIAGNOSIS — L97822 Non-pressure chronic ulcer of other part of left lower leg with fat layer exposed: Secondary | ICD-10-CM | POA: Diagnosis not present

## 2023-05-12 LAB — CBC WITH DIFFERENTIAL/PLATELET
Abs Immature Granulocytes: 0.04 10*3/uL (ref 0.00–0.07)
Basophils Absolute: 0.1 10*3/uL (ref 0.0–0.1)
Basophils Relative: 1 %
Eosinophils Absolute: 0.5 10*3/uL (ref 0.0–0.5)
Eosinophils Relative: 5 %
HCT: 27.8 % — ABNORMAL LOW (ref 36.0–46.0)
Hemoglobin: 8.8 g/dL — ABNORMAL LOW (ref 12.0–15.0)
Immature Granulocytes: 0 %
Lymphocytes Relative: 22 %
Lymphs Abs: 2.3 10*3/uL (ref 0.7–4.0)
MCH: 27.2 pg (ref 26.0–34.0)
MCHC: 31.7 g/dL (ref 30.0–36.0)
MCV: 86.1 fL (ref 80.0–100.0)
Monocytes Absolute: 0.6 10*3/uL (ref 0.1–1.0)
Monocytes Relative: 6 %
Neutro Abs: 6.8 10*3/uL (ref 1.7–7.7)
Neutrophils Relative %: 66 %
Platelets: 321 10*3/uL (ref 150–400)
RBC: 3.23 MIL/uL — ABNORMAL LOW (ref 3.87–5.11)
RDW: 16.4 % — ABNORMAL HIGH (ref 11.5–15.5)
WBC: 10.3 10*3/uL (ref 4.0–10.5)
nRBC: 0 % (ref 0.0–0.2)

## 2023-05-12 LAB — COMPREHENSIVE METABOLIC PANEL WITH GFR
ALT: 6 U/L (ref 0–44)
AST: 12 U/L — ABNORMAL LOW (ref 15–41)
Albumin: 3.1 g/dL — ABNORMAL LOW (ref 3.5–5.0)
Alkaline Phosphatase: 76 U/L (ref 38–126)
Anion gap: 9 (ref 5–15)
BUN: 22 mg/dL (ref 8–23)
CO2: 27 mmol/L (ref 22–32)
Calcium: 8.7 mg/dL — ABNORMAL LOW (ref 8.9–10.3)
Chloride: 107 mmol/L (ref 98–111)
Creatinine, Ser: 1.14 mg/dL — ABNORMAL HIGH (ref 0.44–1.00)
GFR, Estimated: 46 mL/min — ABNORMAL LOW (ref >60)
Glucose, Bld: 79 mg/dL (ref 70–99)
Potassium: 3.4 mmol/L — ABNORMAL LOW (ref 3.5–5.1)
Sodium: 143 mmol/L (ref 135–145)
Total Bilirubin: 0.5 mg/dL (ref 0.0–1.2)
Total Protein: 6.4 g/dL — ABNORMAL LOW (ref 6.5–8.1)

## 2023-05-12 LAB — FERRITIN: Ferritin: 510 ng/mL — ABNORMAL HIGH (ref 11–307)

## 2023-05-12 LAB — IRON AND IRON BINDING CAPACITY (CC-WL,HP ONLY)
Iron: 33 ug/dL (ref 28–170)
Saturation Ratios: 15 % (ref 10.4–31.8)
TIBC: 217 ug/dL — ABNORMAL LOW (ref 250–450)
UIBC: 184 ug/dL (ref 148–442)

## 2023-05-12 NOTE — Assessment & Plan Note (Signed)
 Stable, slightly low kidney function. No current follow-up with a nephrologist. -No immediate action required. Monitor kidney function.

## 2023-05-12 NOTE — Progress Notes (Signed)
 [facility]  HEMATOLOGY CLINIC PROGRESS NOTE  PATIENT NAME: Sharon Bailey   MR#: [id number] DOB: 1930/02/23  Patient Care Team: Benedetto Brady, MD as PCP - General (Family Medicine) Sheryle Donning, MD as PCP - Cardiology (Cardiology)  Date of visit: 05/12/2023   ASSESSMENT & PLAN:   Sharon Bailey is a 89 y.o. lady with past medical history of  collagenous colitis treated with budesonide , diverticulitis, GERD, gout, hyperlipidemia, hypertension, thyroid  disease, cardiac arrhythmia, chronic kidney disease, is being followed in our clinic for multifactorial anemia related to iron  deficiency and anemia of chronic disease from CKD.   Anemia - Multifactorial anemia from iron  deficiency, anemia of chronic disease related to CKD.  No signs of blood loss.   Hemoglobin 8.8 today.  Plan is to transfuse if hemoglobin goes below 8.  - Patient is currently on oral iron  supplements with no side effects. Continue oral iron  supplementation daily.  -Iron  studies today show no evidence of iron  deficiency.  Ferritin pending.   - Await ferritin level results to determine need for IV iron   - Monitor hemoglobin levels closely  - Advise to report any increase in fatigue or dyspnea  RTC in 2 months for re-evaluation.   Chronic kidney disease, stage 3 unspecified (HCC) Stable, slightly low kidney function. No current follow-up with a nephrologist. -No immediate action required. Monitor kidney function.  Collagenous colitis -She is on budesonide .  No recent flare-ups. Occasional diarrhea during flare-ups. -Continue current management and monitor for flare-ups.   Cellulitis No active cellulitis. Previous episodes required hospitalization and transfusion. Swelling management is crucial to prevent ulcer recurrence. - Monitor for signs of cellulitis recurrence - Manage swelling to prevent ulcer formation  Femur fracture with surgical repair Status post femur fracture with  surgical repair and rod placement. Fracture has healed completely. She has been home for two months post-rehabilitation.  General Health Maintenance Improving kidney function with creatinine at 1.14, indicating near-normal levels. Appetite has returned, contributing to better nutritional status. - Encourage continued hydration and balanced diet to maintain kidney function and nutritional status    I spent a total of 25 minutes during this encounter with the patient including review of chart and various tests results, discussions about plan of care and coordination of care plan.  I reviewed lab results and outside records for this visit and discussed relevant results with the patient. Diagnosis, plan of care and treatment options were also discussed in detail with the patient. Opportunity provided to ask questions and answers provided to her apparent satisfaction. Provided instructions to call our clinic with any problems, questions or concerns prior to return visit. I recommended to continue follow-up with PCP and sub-specialists. She verbalized understanding and agreed with the plan. No barriers to learning was detected.  Arlo Berber, MD  05/12/2023 5:05 PM  [facility] - A DEPT OF [facility] [address] [place] [address] Dept: [phone] Dept Fax: [phone]   CHIEF COMPLAINT/ REASON FOR VISIT:  Follow-up for multifactorial anemia, related to iron  deficiency and anemia of chronic disease related to CKD.  INTERVAL HISTORY:  Discussed the use of AI scribe software for clinical note transcription with the patient, who gave verbal consent to proceed.  History of Present Illness She was accompanied by her daughter to clinic today.  She experienced a fall on January 31st at her retina doctor's office, resulting in a femur fracture. Surgery was performed  the following day, with a rod placed in her femur and  attached to her hip. The fracture has completely healed. Post-surgery, she was hospitalized for a week and then spent a month in rehabilitation. She has been home for two months since March.  She has a history of anemia, requiring blood transfusions during her hospital stay and again when she developed cellulitis. Her current hemoglobin level is 8.8, which is lower than previous levels but not low enough to require a transfusion. No signs of active bleeding, such as blood in stools or black stools. She is currently taking oral iron  supplements once a day.  Her kidney function has improved, with a creatinine level of 1.14, and she reports adequate hydration. She has regained her appetite, which had been poor for some time, leading to fluid retention and swelling. She manages swelling with a diuretic as needed and elevates her feet at home to reduce swelling.  No chest pain, difficulty breathing, or headaches. Her blood pressure tends to run high, but she has not taken her medication today. She is scheduled to see a wound doctor later today for evaluation of swelling and potential ulcer formation.  She received IV iron  in 2024, which improved her symptoms at the time. She continues to take oral iron  supplements daily without any side effects. She denies any signs of blood loss such as bloody or black stools, nosebleeds, or gum bleeds.   SUMMARY OF HEMATOLOGIC HISTORY:  December 20 2014:  Colonoscopy.  Severe diverticulosis in sigmoid colon   February 12, 2022: WBC 9.4 hemoglobin 7.3 MCV 79 platelet count 221; 82 segs 11 lymphs 4 monos 3 eos Ferritin 33   Chemistries notable for creatinine 1.18   February 21 2022:  [facility]  Colitis overall under good control with budesinolide.   Has been taking oral iron  for the past week.  No history of IV iron  nor required PRBC's in the past.  Has tolerated oral iron .  Has a regular diet.  No history of postpartum hemorrhage requiring  transfusion.   No history of hemorrhage postoperatively requiring transfusion.  No hematochezia, melena, hemoptysis, hematuria.   No history of intra-articular or soft tissue bleeding.  Uses ibuprofen  about 2 to 3 times a week for pain.  Is not taking oral anticoagulants but does take ASA daily as an antiplatelet drugs.  No history of abnormal bleeding in family members.  Patient has symptoms of fatigue, pallor,  DOE, intolerance to cold, decreased performance status.  Patient has pica to ice but not starch/dirt.  Has never undergone an EGD.     March 23, 2022: WBC 13.2 hemoglobin 11.4 platelet count 208; 73 segs 13 lymphs 5 monos 7 eos 1 basophil reticulocyte count 2.5% Coombs test negative  Haptoglobin 136 SPEP with IEP showed no paraprotein.  Serum free kappa 53.1 lambda 27.9 with a kappa lambda 1.90 IgG 860 IgA 184 IgM 95 INR 1.1 PTT 26   Ferritin 437 folate 30.4 B12 203 zinc  23. CMP notable for potassium 3.4 creatinine 1.1 glucose 108   May 29 2022: Remains on oral iron  and vitamin B12 daily which she is tolerating well.  Occasional flare of colitis which can last a day.  She is followed by GI and was last seen there in January 2024.  Experiencing generalized itching helped briefly by lotions.     WBC 13.9 hemoglobin 12.7 MCV 92 platelet count 224.  ANC 11.5 CMP notable for creatinine 1.33 glucose 100 calcium  8.6 albumin  4.2  August 28 2022:  Scheduled follow up for anemia.  Feels well.  Taking oral iron  and B12 daily.  Colitis flares up occasionally.  No gout attacks.     WBC 10.3 hemoglobin 11.7 MCV 93 platelet count 171; 65 segs 21 lymphs 7 monos 5 eos 1 basophil  I have reviewed the past medical history, past surgical history, social history and family history with the patient and they are unchanged from previous note.  ALLERGIES: She is allergic to bactrim  [sulfamethoxazole -trimethoprim ] and cipro [ciprofloxacin hcl].  MEDICATIONS:  Current Outpatient Medications  Medication  Sig Dispense Refill   allopurinol  (ZYLOPRIM ) 300 MG tablet Take 0.5 tablets (150 mg total) by mouth daily. (Patient taking differently: Take 300 mg by mouth daily.)     amLODipine  (NORVASC ) 5 MG tablet Take 5 mg by mouth daily.     aspirin  EC 81 MG tablet Take 81 mg by mouth daily. Swallow whole.     budesonide  (ENTOCORT EC ) 3 MG 24 hr capsule Take 1 capsule (3 mg total) by mouth daily. 90 capsule 0   Cholecalciferol (VITAMIN D3) 1.25 MG (50000 UT) CAPS Take 1 capsule by mouth daily.     doxycycline  (VIBRAMYCIN ) 100 MG capsule Take 1 capsule (100 mg total) by mouth 2 (two) times daily. 20 capsule 0   ferrous sulfate  325 (65 FE) MG EC tablet Take 325 mg by mouth daily with breakfast.     furosemide  (LASIX ) 20 MG tablet Take 20 mg by mouth daily.     levothyroxine  (SYNTHROID ) 100 MCG tablet Take 100 mcg by mouth every morning.     potassium chloride SA (KLOR-CON M) 20 MEQ tablet Take 20 mEq by mouth daily.     SANTYL 250 UNIT/GM ointment Apply 1 Application topically every other day. With dressing changes at home to lower legs     simvastatin  (ZOCOR ) 40 MG tablet Take 40 mg by mouth daily.     traMADol  (ULTRAM ) 50 MG tablet Take 1 tablet (50 mg total) by mouth every 8 (eight) hours as needed for severe pain (pain score 7-10). (Patient taking differently: Take 50 mg by mouth daily as needed for severe pain (pain score 7-10).) 10 tablet 0   valsartan  (DIOVAN ) 160 MG tablet Take 160 mg by mouth daily.     vitamin B-12 1000 MCG tablet Take 1 tablet (1,000 mcg total) by mouth daily.     polyethylene glycol (MIRALAX  / GLYCOLAX ) 17 g packet Take 17 g by mouth daily as needed for moderate constipation. (Patient not taking: Reported on 05/12/2023)     No current facility-administered medications for this visit.     REVIEW OF SYSTEMS:    Review of Systems - Oncology  All other pertinent systems were reviewed with the patient and are negative.  PHYSICAL EXAMINATION:   Onc Performance Status - 05/12/23  1206       ECOG Perf Status   ECOG Perf Status Ambulatory and capable of all selfcare but unable to carry out any work activities.  Up and about more than 50% of waking hours      KPS SCALE   KPS % SCORE Requires occasional assistance but is able to care for most needs             Vitals:   05/12/23 1142 05/12/23 1143  BP: (!) 191/83 (!) 193/92  Pulse: 94   Resp: 19   Temp: (!) 97.3 F (36.3 C)   SpO2: 100%    Filed Weights   05/12/23  1142  Weight: 137 lb 4.8 oz (62.3 kg)    Physical Exam Constitutional:      General: She is not in acute distress.    Appearance: Normal appearance.  HENT:     Head: Normocephalic and atraumatic.  Eyes:     General: No scleral icterus.    Conjunctiva/sclera: Conjunctivae normal.  Cardiovascular:     Rate and Rhythm: Normal rate and regular rhythm.  Pulmonary:     Effort: Pulmonary effort is normal.  Abdominal:     General: There is no distension.  Musculoskeletal:     Comments: Both lower extremities are wrapped with Ace bandages.  Neurological:     General: No focal deficit present.     Mental Status: She is alert and oriented to person, place, and time.  Psychiatric:        Mood and Affect: Mood normal.        Behavior: Behavior normal.        Thought Content: Thought content normal.     LABORATORY DATA:   I have reviewed the data as listed.  Results for orders placed or performed in visit on 05/12/23  Iron  and Iron  Binding Capacity ([facility] only)  Result Value Ref Range   Iron  33 28 - 170 ug/dL   TIBC 409 (L) 811 - 914 ug/dL   Saturation Ratios 15 10.4 - 31.8 %   UIBC 184 148 - 442 ug/dL  CBC with Differential/Platelet  Result Value Ref Range   WBC 10.3 4.0 - 10.5 K/uL   RBC 3.23 (L) 3.87 - 5.11 MIL/uL   Hemoglobin 8.8 (L) 12.0 - 15.0 g/dL   HCT 78.2 (L) 95.6 - 21.3 %   MCV 86.1 80.0 - 100.0 fL   MCH 27.2 26.0 - 34.0 pg   MCHC 31.7 30.0 - 36.0 g/dL   RDW 08.6 (H) 57.8 - 46.9 %   Platelets 321 150 - 400  K/uL   nRBC 0.0 0.0 - 0.2 %   Neutrophils Relative % 66 %   Neutro Abs 6.8 1.7 - 7.7 K/uL   Lymphocytes Relative 22 %   Lymphs Abs 2.3 0.7 - 4.0 K/uL   Monocytes Relative 6 %   Monocytes Absolute 0.6 0.1 - 1.0 K/uL   Eosinophils Relative 5 %   Eosinophils Absolute 0.5 0.0 - 0.5 K/uL   Basophils Relative 1 %   Basophils Absolute 0.1 0.0 - 0.1 K/uL   Immature Granulocytes 0 %   Abs Immature Granulocytes 0.04 0.00 - 0.07 K/uL  Comprehensive metabolic panel  Result Value Ref Range   Sodium 143 135 - 145 mmol/L   Potassium 3.4 (L) 3.5 - 5.1 mmol/L   Chloride 107 98 - 111 mmol/L   CO2 27 22 - 32 mmol/L   Glucose, Bld 79 70 - 99 mg/dL   BUN 22 8 - 23 mg/dL   Creatinine, Ser 6.29 (H) 0.44 - 1.00 mg/dL   Calcium  8.7 (L) 8.9 - 10.3 mg/dL   Total Protein 6.4 (L) 6.5 - 8.1 g/dL   Albumin  3.1 (L) 3.5 - 5.0 g/dL   AST 12 (L) 15 - 41 U/L   ALT 6 0 - 44 U/L   Alkaline Phosphatase 76 38 - 126 U/L   Total Bilirubin 0.5 0.0 - 1.2 mg/dL   GFR, Estimated 46 (L) >60 mL/min   Anion gap 9 5 - 15    RADIOGRAPHIC STUDIES:  No recent pertinent imaging studies available to review.  Orders Placed This Encounter  Procedures   CBC with Differential (Cancer Center Only)    Standing Status:   Future    Expected Date:   07/12/2023    Expiration Date:   05/11/2024   CMP (Cancer Center only)    Standing Status:   Future    Expected Date:   07/12/2023    Expiration Date:   05/11/2024   Iron  and Iron  Binding Capacity ([facility] only)    Standing Status:   Future    Expected Date:   07/12/2023    Expiration Date:   05/11/2024   Ferritin    Standing Status:   Future    Expected Date:   07/12/2023    Expiration Date:   05/11/2024     Future Appointments  Date Time Provider Department Center  05/19/2023  1:00 PM Rob Chihuahua, MD [facility]  05/26/2023  2:15 PM Mercy Stall, MD [facility]  07/06/2023  2:00 PM Tory Freiberg, NP [facility]  07/14/2023 11:00 AM [facility]  [facility] None  07/14/2023 11:30 AM Vera Wishart, Gale Jude, MD [facility] None  09/13/2023  4:00 PM Ruffin Cotton, DPM [facility]     This document was completed utilizing speech recognition software. Grammatical errors, random word insertions, pronoun errors, and incomplete sentences are an occasional consequence of this system due to software limitations, ambient noise, and hardware issues. Any formal questions or concerns about the content, text or information contained within the body of this dictation should be directly addressed to the provider for clarification.

## 2023-05-12 NOTE — Assessment & Plan Note (Signed)
-  She is on budesonide.  No recent flare-ups. Occasional diarrhea during flare-ups. -Continue current management and monitor for flare-ups.

## 2023-05-12 NOTE — Assessment & Plan Note (Addendum)
-   Multifactorial anemia from iron  deficiency, anemia of chronic disease related to CKD.  No signs of blood loss.   Hemoglobin 8.8 today.  Plan is to transfuse if hemoglobin goes below 8.  - Patient is currently on oral iron  supplements with no side effects. Continue oral iron  supplementation daily.  -Iron  studies today show no evidence of iron  deficiency.  Ferritin pending.   - Await ferritin level results to determine need for IV iron   - Monitor hemoglobin levels closely  - Advise to report any increase in fatigue or dyspnea  RTC in 2 months for re-evaluation.

## 2023-05-13 DIAGNOSIS — L97522 Non-pressure chronic ulcer of other part of left foot with fat layer exposed: Secondary | ICD-10-CM | POA: Diagnosis not present

## 2023-05-19 ENCOUNTER — Encounter (HOSPITAL_BASED_OUTPATIENT_CLINIC_OR_DEPARTMENT_OTHER): Admitting: Internal Medicine

## 2023-05-19 DIAGNOSIS — L97522 Non-pressure chronic ulcer of other part of left foot with fat layer exposed: Secondary | ICD-10-CM | POA: Diagnosis not present

## 2023-05-19 DIAGNOSIS — L97812 Non-pressure chronic ulcer of other part of right lower leg with fat layer exposed: Secondary | ICD-10-CM | POA: Diagnosis not present

## 2023-05-19 DIAGNOSIS — L89623 Pressure ulcer of left heel, stage 3: Secondary | ICD-10-CM | POA: Diagnosis not present

## 2023-05-19 DIAGNOSIS — E43 Unspecified severe protein-calorie malnutrition: Secondary | ICD-10-CM | POA: Diagnosis not present

## 2023-05-19 DIAGNOSIS — S81811A Laceration without foreign body, right lower leg, initial encounter: Secondary | ICD-10-CM | POA: Diagnosis not present

## 2023-05-19 DIAGNOSIS — Z6825 Body mass index (BMI) 25.0-25.9, adult: Secondary | ICD-10-CM | POA: Diagnosis not present

## 2023-05-19 DIAGNOSIS — L97822 Non-pressure chronic ulcer of other part of left lower leg with fat layer exposed: Secondary | ICD-10-CM | POA: Diagnosis not present

## 2023-05-19 DIAGNOSIS — L97523 Non-pressure chronic ulcer of other part of left foot with necrosis of muscle: Secondary | ICD-10-CM | POA: Diagnosis not present

## 2023-05-19 DIAGNOSIS — I890 Lymphedema, not elsewhere classified: Secondary | ICD-10-CM | POA: Diagnosis not present

## 2023-05-19 DIAGNOSIS — L97811 Non-pressure chronic ulcer of other part of right lower leg limited to breakdown of skin: Secondary | ICD-10-CM | POA: Diagnosis not present

## 2023-05-19 DIAGNOSIS — I872 Venous insufficiency (chronic) (peripheral): Secondary | ICD-10-CM | POA: Diagnosis not present

## 2023-05-19 DIAGNOSIS — H353114 Nonexudative age-related macular degeneration, right eye, advanced atrophic with subfoveal involvement: Secondary | ICD-10-CM | POA: Diagnosis not present

## 2023-05-19 DIAGNOSIS — L02415 Cutaneous abscess of right lower limb: Secondary | ICD-10-CM | POA: Diagnosis not present

## 2023-05-25 DIAGNOSIS — E039 Hypothyroidism, unspecified: Secondary | ICD-10-CM | POA: Diagnosis not present

## 2023-05-25 DIAGNOSIS — I499 Cardiac arrhythmia, unspecified: Secondary | ICD-10-CM | POA: Diagnosis not present

## 2023-05-25 DIAGNOSIS — Z0000 Encounter for general adult medical examination without abnormal findings: Secondary | ICD-10-CM | POA: Diagnosis not present

## 2023-05-25 DIAGNOSIS — M109 Gout, unspecified: Secondary | ICD-10-CM | POA: Diagnosis not present

## 2023-05-25 DIAGNOSIS — I10 Essential (primary) hypertension: Secondary | ICD-10-CM | POA: Diagnosis not present

## 2023-05-25 DIAGNOSIS — R609 Edema, unspecified: Secondary | ICD-10-CM | POA: Diagnosis not present

## 2023-05-25 DIAGNOSIS — E213 Hyperparathyroidism, unspecified: Secondary | ICD-10-CM | POA: Diagnosis not present

## 2023-05-25 DIAGNOSIS — E7800 Pure hypercholesterolemia, unspecified: Secondary | ICD-10-CM | POA: Diagnosis not present

## 2023-05-25 DIAGNOSIS — D649 Anemia, unspecified: Secondary | ICD-10-CM | POA: Diagnosis not present

## 2023-05-26 ENCOUNTER — Encounter (HOSPITAL_BASED_OUTPATIENT_CLINIC_OR_DEPARTMENT_OTHER): Admitting: General Surgery

## 2023-05-26 DIAGNOSIS — I890 Lymphedema, not elsewhere classified: Secondary | ICD-10-CM | POA: Diagnosis not present

## 2023-05-26 DIAGNOSIS — Z6825 Body mass index (BMI) 25.0-25.9, adult: Secondary | ICD-10-CM | POA: Diagnosis not present

## 2023-05-26 DIAGNOSIS — I872 Venous insufficiency (chronic) (peripheral): Secondary | ICD-10-CM | POA: Diagnosis not present

## 2023-05-26 DIAGNOSIS — L97522 Non-pressure chronic ulcer of other part of left foot with fat layer exposed: Secondary | ICD-10-CM | POA: Diagnosis not present

## 2023-05-26 DIAGNOSIS — E43 Unspecified severe protein-calorie malnutrition: Secondary | ICD-10-CM | POA: Diagnosis not present

## 2023-05-26 DIAGNOSIS — L02415 Cutaneous abscess of right lower limb: Secondary | ICD-10-CM | POA: Diagnosis not present

## 2023-05-26 DIAGNOSIS — S81811A Laceration without foreign body, right lower leg, initial encounter: Secondary | ICD-10-CM | POA: Diagnosis not present

## 2023-05-26 DIAGNOSIS — L97523 Non-pressure chronic ulcer of other part of left foot with necrosis of muscle: Secondary | ICD-10-CM | POA: Diagnosis not present

## 2023-05-26 DIAGNOSIS — L89623 Pressure ulcer of left heel, stage 3: Secondary | ICD-10-CM | POA: Diagnosis not present

## 2023-05-26 DIAGNOSIS — L97812 Non-pressure chronic ulcer of other part of right lower leg with fat layer exposed: Secondary | ICD-10-CM | POA: Diagnosis not present

## 2023-05-26 DIAGNOSIS — L97822 Non-pressure chronic ulcer of other part of left lower leg with fat layer exposed: Secondary | ICD-10-CM | POA: Diagnosis not present

## 2023-06-07 DIAGNOSIS — N1831 Chronic kidney disease, stage 3a: Secondary | ICD-10-CM | POA: Diagnosis not present

## 2023-06-07 DIAGNOSIS — J988 Other specified respiratory disorders: Secondary | ICD-10-CM | POA: Diagnosis not present

## 2023-06-07 DIAGNOSIS — R54 Age-related physical debility: Secondary | ICD-10-CM | POA: Diagnosis not present

## 2023-06-07 DIAGNOSIS — I10 Essential (primary) hypertension: Secondary | ICD-10-CM | POA: Diagnosis not present

## 2023-06-07 DIAGNOSIS — R059 Cough, unspecified: Secondary | ICD-10-CM | POA: Diagnosis not present

## 2023-06-08 ENCOUNTER — Encounter (HOSPITAL_BASED_OUTPATIENT_CLINIC_OR_DEPARTMENT_OTHER): Payer: Self-pay

## 2023-06-08 ENCOUNTER — Encounter (HOSPITAL_BASED_OUTPATIENT_CLINIC_OR_DEPARTMENT_OTHER): Admitting: General Surgery

## 2023-06-14 ENCOUNTER — Encounter (HOSPITAL_BASED_OUTPATIENT_CLINIC_OR_DEPARTMENT_OTHER): Attending: General Surgery | Admitting: General Surgery

## 2023-06-14 DIAGNOSIS — I872 Venous insufficiency (chronic) (peripheral): Secondary | ICD-10-CM | POA: Insufficient documentation

## 2023-06-14 DIAGNOSIS — L97523 Non-pressure chronic ulcer of other part of left foot with necrosis of muscle: Secondary | ICD-10-CM | POA: Insufficient documentation

## 2023-06-14 DIAGNOSIS — E43 Unspecified severe protein-calorie malnutrition: Secondary | ICD-10-CM | POA: Diagnosis not present

## 2023-06-14 DIAGNOSIS — N183 Chronic kidney disease, stage 3 unspecified: Secondary | ICD-10-CM | POA: Diagnosis not present

## 2023-06-14 DIAGNOSIS — I890 Lymphedema, not elsewhere classified: Secondary | ICD-10-CM | POA: Insufficient documentation

## 2023-06-15 DIAGNOSIS — H353114 Nonexudative age-related macular degeneration, right eye, advanced atrophic with subfoveal involvement: Secondary | ICD-10-CM | POA: Diagnosis not present

## 2023-06-15 DIAGNOSIS — H35363 Drusen (degenerative) of macula, bilateral: Secondary | ICD-10-CM | POA: Diagnosis not present

## 2023-06-15 DIAGNOSIS — H353221 Exudative age-related macular degeneration, left eye, with active choroidal neovascularization: Secondary | ICD-10-CM | POA: Diagnosis not present

## 2023-06-16 DIAGNOSIS — S72142D Displaced intertrochanteric fracture of left femur, subsequent encounter for closed fracture with routine healing: Secondary | ICD-10-CM | POA: Diagnosis not present

## 2023-06-16 DIAGNOSIS — S72141D Displaced intertrochanteric fracture of right femur, subsequent encounter for closed fracture with routine healing: Secondary | ICD-10-CM | POA: Diagnosis not present

## 2023-06-22 ENCOUNTER — Encounter (HOSPITAL_BASED_OUTPATIENT_CLINIC_OR_DEPARTMENT_OTHER): Admitting: General Surgery

## 2023-06-22 DIAGNOSIS — E43 Unspecified severe protein-calorie malnutrition: Secondary | ICD-10-CM | POA: Diagnosis not present

## 2023-06-22 DIAGNOSIS — I872 Venous insufficiency (chronic) (peripheral): Secondary | ICD-10-CM | POA: Diagnosis not present

## 2023-06-22 DIAGNOSIS — L97523 Non-pressure chronic ulcer of other part of left foot with necrosis of muscle: Secondary | ICD-10-CM | POA: Diagnosis not present

## 2023-06-22 DIAGNOSIS — I890 Lymphedema, not elsewhere classified: Secondary | ICD-10-CM | POA: Diagnosis not present

## 2023-06-22 DIAGNOSIS — N183 Chronic kidney disease, stage 3 unspecified: Secondary | ICD-10-CM | POA: Diagnosis not present

## 2023-07-01 ENCOUNTER — Encounter (HOSPITAL_BASED_OUTPATIENT_CLINIC_OR_DEPARTMENT_OTHER): Admitting: General Surgery

## 2023-07-01 DIAGNOSIS — L97523 Non-pressure chronic ulcer of other part of left foot with necrosis of muscle: Secondary | ICD-10-CM | POA: Diagnosis not present

## 2023-07-01 DIAGNOSIS — I890 Lymphedema, not elsewhere classified: Secondary | ICD-10-CM | POA: Diagnosis not present

## 2023-07-01 DIAGNOSIS — I872 Venous insufficiency (chronic) (peripheral): Secondary | ICD-10-CM | POA: Diagnosis not present

## 2023-07-01 DIAGNOSIS — N183 Chronic kidney disease, stage 3 unspecified: Secondary | ICD-10-CM | POA: Diagnosis not present

## 2023-07-01 DIAGNOSIS — E43 Unspecified severe protein-calorie malnutrition: Secondary | ICD-10-CM | POA: Diagnosis not present

## 2023-07-06 ENCOUNTER — Ambulatory Visit: Admitting: Nurse Practitioner

## 2023-07-06 ENCOUNTER — Encounter: Payer: Self-pay | Admitting: Nurse Practitioner

## 2023-07-06 ENCOUNTER — Encounter (HOSPITAL_BASED_OUTPATIENT_CLINIC_OR_DEPARTMENT_OTHER): Attending: General Surgery | Admitting: General Surgery

## 2023-07-06 VITALS — BP 120/68 | HR 78 | Ht 63.0 in | Wt 132.0 lb

## 2023-07-06 DIAGNOSIS — N183 Chronic kidney disease, stage 3 unspecified: Secondary | ICD-10-CM | POA: Insufficient documentation

## 2023-07-06 DIAGNOSIS — I872 Venous insufficiency (chronic) (peripheral): Secondary | ICD-10-CM | POA: Insufficient documentation

## 2023-07-06 DIAGNOSIS — I890 Lymphedema, not elsewhere classified: Secondary | ICD-10-CM | POA: Diagnosis not present

## 2023-07-06 DIAGNOSIS — K52831 Collagenous colitis: Secondary | ICD-10-CM | POA: Diagnosis not present

## 2023-07-06 DIAGNOSIS — L97523 Non-pressure chronic ulcer of other part of left foot with necrosis of muscle: Secondary | ICD-10-CM | POA: Diagnosis not present

## 2023-07-06 DIAGNOSIS — E43 Unspecified severe protein-calorie malnutrition: Secondary | ICD-10-CM | POA: Insufficient documentation

## 2023-07-06 NOTE — Progress Notes (Signed)
 I called the patient spoke to her daughter and provided Dr. Nancyann recommendations.  Patient will continue Budesonide  3 mg once daily for now as she is doing well.  If the patient wishes to wean off of Budesonide  there is a risk that her diarrhea or could recur.  Her daughter will discuss this option with her mother and will let me know if she wants to make any adjustments to taking Budesonide .

## 2023-07-06 NOTE — Patient Instructions (Signed)
 Continue budesonide  3 mg: 1 tablet daily  Follow up in 1 year or sooner as needed.  Please follow up with Hematology regarding chronic anemia.  Thank you for entrusting me with your care and for choosing [employer], [place], CRNP   _____________________________________________________  If your blood pressure at your visit was 140/90 or greater, please contact your primary care physician to follow up on this.  _______________________________________________________  If you are age 63 or older, your body mass index should be between 23-30. Your Body mass index is 23.38 kg/m. If this is out of the aforementioned range listed, please consider follow up with your Primary Care Provider.  If you are age 60 or younger, your body mass index should be between 19-25. Your Body mass index is 23.38 kg/m. If this is out of the aformentioned range listed, please consider follow up with your Primary Care Provider.   ________________________________________________________  The [facility] providers would like to encourage you to use MYCHART to communicate with providers for non-urgent requests or questions.  Due to long hold times on the telephone, sending your provider a message by [facility] may be a faster and more efficient way to get a response.  Please allow 48 business hours for a response.  Please remember that this is for non-urgent requests.  _______________________________________________________

## 2023-07-06 NOTE — Progress Notes (Addendum)
 07/06/2023 Sharon Bailey [id number] 08-25-1930   Chief Complaint: Annual follow up, collagenous colitis   History of Present Illness: Sharon Bailey is a 89  year old female with a past medical history of hypertension, hyperlipidemia, chronic diastolic CHF, mild aortic stenosis, CKD stage IIIb, hypothyroidism, asthma, chronic hypoxic respiratory failure on oxygen 2L [place], left foot cellulitis, B12 deficiency, IDA, diverticulosis and collagenous colitis. She is followed by Dr. Abran. She presents today for her annual office visit.  She remains on Budesonide  3 mg 1 tab p.o. daily which controls her collagenous colitis.  She denies having any diarrhea.  She is passing 1 or 2 formed stools daily.  No bloody or black stools.  No abdominal pain.  Her appetite is good. Her most recent colonoscopy was 12/20/2014 which showed severe sigmoid diverticulosis and colon biopsies confirmed collagenous colitis.  She has lost 13 pounds over the past 3 to 4 months.  She has IDA, followed by hematologist Dr. Autumn, next appointment scheduled 07/14/2023.  She is on ASA 81 mg daily, no other NSAIDs.  She is on ferrous sulfate  325 mg once daily.  Last IV infusions were Feb and March 2024.  She had a rough start to the new year.  She was admitted to the hospital 1/31 - 252025 secondary to a left hip fracture after falling and subsequently underwent ORIF and she required 1 unit of PRBCs during this admission.  Admitted 2/18 - 02/24/2023 with worsening lower extremity edema in setting of hypoalbuminemia and poor nutritional state and  family reported outpatient ultrasound showed a right upper extremity DVT and bilateral IJ DVT.  Inpatient CTA chest, lower extremity DVT and bilateral upper extremity DVT were unremarkable.  Admitted 3/9 - 03/17/2023 with worsened in lower extremity edema and left foot cellulitis which failed oral antibiotics as an outpatient.  She received IV Ancef  and was discharged on Keflex  4 times daily x  14 days.  She had a pressure ulcer to the buttock area followed by wound care which eventually resolved.  Labs 05/26/2023: Iron  34.  Iron  saturation 12%.  TIBC 277.  Ferritin 249.6.     Latest Ref Rng & Units 05/12/2023   11:09 AM 03/28/2023   12:02 PM 03/17/2023    5:00 AM  CBC  WBC 4.0 - 10.5 K/uL 10.3  10.7    Hemoglobin 12.0 - 15.0 g/dL 8.8  89.9  9.1   Hematocrit 36.0 - 46.0 % 27.8  32.7  28.6   Platelets 150 - 400 K/uL 321  237      Current Outpatient Medications on File Prior to Visit  Medication Sig Dispense Refill   allopurinol  (ZYLOPRIM ) 300 MG tablet Take 0.5 tablets (150 mg total) by mouth daily.     amLODipine  (NORVASC ) 5 MG tablet Take 5 mg by mouth daily.     Ascorbic Acid  (VITAMIN C) 1000 MG tablet Take 1,000 mg by mouth daily.     aspirin  EC 81 MG tablet Take 81 mg by mouth daily. Swallow whole.     budesonide  (ENTOCORT EC ) 3 MG 24 hr capsule Take 1 capsule (3 mg total) by mouth daily. 90 capsule 0   Cholecalciferol (VITAMIN D3) 1.25 MG (50000 UT) CAPS Take 1 capsule by mouth daily.     ferrous sulfate  325 (65 FE) MG EC tablet Take 325 mg by mouth daily with breakfast.     furosemide  (LASIX ) 20 MG tablet Take 20 mg by mouth daily.  levothyroxine  (SYNTHROID ) 100 MCG tablet Take 100 mcg by mouth every morning.     polyethylene glycol (MIRALAX  / GLYCOLAX ) 17 g packet Take 17 g by mouth daily as needed for moderate constipation.     SANTYL 250 UNIT/GM ointment Apply 1 Application topically every other day. With dressing changes at home to lower legs     simvastatin  (ZOCOR ) 40 MG tablet Take 40 mg by mouth daily.     traMADol  (ULTRAM ) 50 MG tablet Take 1 tablet (50 mg total) by mouth every 8 (eight) hours as needed for severe pain (pain score 7-10). 10 tablet 0   valsartan  (DIOVAN ) 160 MG tablet Take 160 mg by mouth daily.     vitamin B-12 1000 MCG tablet Take 1 tablet (1,000 mcg total) by mouth daily.     zinc  gluconate 50 MG tablet Take 50 mg by mouth daily.      doxycycline  (VIBRAMYCIN ) 100 MG capsule Take 1 capsule (100 mg total) by mouth 2 (two) times daily. (Patient not taking: Reported on 07/06/2023) 20 capsule 0   potassium chloride SA (KLOR-CON M) 20 MEQ tablet Take 20 mEq by mouth daily. (Patient not taking: Reported on 07/06/2023)     No current facility-administered medications on file prior to visit.   Allergies  Allergen Reactions   Bactrim  [Sulfamethoxazole -Trimethoprim ] Diarrhea and Nausea Only   Cipro [Ciprofloxacin Hcl] Hives   Current Medications, Allergies, Past Medical History, Past Surgical History, Family History and Social History were reviewed in [employer] record.  Review of Systems:   Constitutional: Negative for fever, sweats, chills or weight loss.  Respiratory: Negative for shortness of breath.   Cardiovascular: Negative for chest pain, palpitations and leg swelling.  Gastrointestinal: See HPI.  Musculoskeletal: Negative for back pain or muscle aches.  Neurological: Negative for dizziness, headaches or paresthesias.   Physical Exam: BP 120/68   Pulse 78   Ht 5' 3 (1.6 m)   Wt 132 lb (59.9 kg)   BMI 23.38 kg/m   Wt Readings from Last 3 Encounters:  07/06/23 132 lb (59.9 kg)  05/12/23 137 lb 4.8 oz (62.3 kg)  03/28/23 145 lb (65.8 kg)    General: in no acute distress. Head: Normocephalic and atraumatic. Eyes: No scleral icterus. Conjunctiva pink . Ears: Normal auditory acuity. Mouth: Dentition intact. No ulcers or lesions.  Lungs: Clear throughout to auscultation. Heart: Regular rate and rhythm, III/VI systolic murmur. Abdomen: Soft, nontender and nondistended. No masses or hepatomegaly. Normal bowel sounds x 4 quadrants.  Rectal: Deferred.  Musculoskeletal: Symmetrical with no gross deformities. Extremities: No edema. Neurological: Alert oriented x 4. No focal deficits.  Psychological: Alert and cooperative. Normal mood and affect  Assessment and Recommendations:  89 year old  female with collagenous colitis, stable on Budesonide  3mg  po QD -Continue Budesonide  3mg  one tab po QD for now,  to discuss weaning off Budesonide  with Dr. Abran -Patient to contact office if diarrhea recurs    Chronic anemia, multifactorial:  iron  deficiency anemia, B12 deficiency, anemia of chronic disease secondary to CKD -Continue follow up and labs per hematologist Dr. Autumn, appointment scheduled 07/14/2023   Severe sigmoid diverticulosis per colonoscopy in 2016

## 2023-07-07 DIAGNOSIS — H353114 Nonexudative age-related macular degeneration, right eye, advanced atrophic with subfoveal involvement: Secondary | ICD-10-CM | POA: Diagnosis not present

## 2023-07-14 ENCOUNTER — Inpatient Hospital Stay: Admitting: Oncology

## 2023-07-14 ENCOUNTER — Inpatient Hospital Stay: Attending: Oncology

## 2023-07-14 ENCOUNTER — Encounter: Payer: Self-pay | Admitting: Oncology

## 2023-07-14 VITALS — BP 193/80 | HR 80 | Temp 98.0°F | Resp 17 | Ht 63.0 in | Wt 133.5 lb

## 2023-07-14 DIAGNOSIS — I129 Hypertensive chronic kidney disease with stage 1 through stage 4 chronic kidney disease, or unspecified chronic kidney disease: Secondary | ICD-10-CM | POA: Diagnosis not present

## 2023-07-14 DIAGNOSIS — N183 Chronic kidney disease, stage 3 unspecified: Secondary | ICD-10-CM | POA: Diagnosis present

## 2023-07-14 DIAGNOSIS — D500 Iron deficiency anemia secondary to blood loss (chronic): Secondary | ICD-10-CM

## 2023-07-14 DIAGNOSIS — D631 Anemia in chronic kidney disease: Secondary | ICD-10-CM | POA: Insufficient documentation

## 2023-07-14 DIAGNOSIS — D509 Iron deficiency anemia, unspecified: Secondary | ICD-10-CM | POA: Diagnosis not present

## 2023-07-14 DIAGNOSIS — K52831 Collagenous colitis: Secondary | ICD-10-CM | POA: Diagnosis not present

## 2023-07-14 LAB — CBC WITH DIFFERENTIAL (CANCER CENTER ONLY)
Abs Immature Granulocytes: 0.04 K/uL (ref 0.00–0.07)
Basophils Absolute: 0.1 K/uL (ref 0.0–0.1)
Basophils Relative: 1 %
Eosinophils Absolute: 0.7 K/uL — ABNORMAL HIGH (ref 0.0–0.5)
Eosinophils Relative: 8 %
HCT: 30.7 % — ABNORMAL LOW (ref 36.0–46.0)
Hemoglobin: 9.5 g/dL — ABNORMAL LOW (ref 12.0–15.0)
Immature Granulocytes: 0 %
Lymphocytes Relative: 25 %
Lymphs Abs: 2.3 K/uL (ref 0.7–4.0)
MCH: 26.8 pg (ref 26.0–34.0)
MCHC: 30.9 g/dL (ref 30.0–36.0)
MCV: 86.7 fL (ref 80.0–100.0)
Monocytes Absolute: 0.6 K/uL (ref 0.1–1.0)
Monocytes Relative: 6 %
Neutro Abs: 5.4 K/uL (ref 1.7–7.7)
Neutrophils Relative %: 60 %
Platelet Count: 234 K/uL (ref 150–400)
RBC: 3.54 MIL/uL — ABNORMAL LOW (ref 3.87–5.11)
RDW: 16.8 % — ABNORMAL HIGH (ref 11.5–15.5)
WBC Count: 9 K/uL (ref 4.0–10.5)
nRBC: 0 % (ref 0.0–0.2)

## 2023-07-14 LAB — CMP (CANCER CENTER ONLY)
ALT: 7 U/L (ref 0–44)
AST: 14 U/L — ABNORMAL LOW (ref 15–41)
Albumin: 3.5 g/dL (ref 3.5–5.0)
Alkaline Phosphatase: 76 U/L (ref 38–126)
Anion gap: 7 (ref 5–15)
BUN: 29 mg/dL — ABNORMAL HIGH (ref 8–23)
CO2: 27 mmol/L (ref 22–32)
Calcium: 9.4 mg/dL (ref 8.9–10.3)
Chloride: 110 mmol/L (ref 98–111)
Creatinine: 1.1 mg/dL — ABNORMAL HIGH (ref 0.44–1.00)
GFR, Estimated: 48 mL/min — ABNORMAL LOW (ref >60)
Glucose, Bld: 81 mg/dL (ref 70–99)
Potassium: 3.8 mmol/L (ref 3.5–5.1)
Sodium: 144 mmol/L (ref 135–145)
Total Bilirubin: 0.6 mg/dL (ref 0.0–1.2)
Total Protein: 6.4 g/dL — ABNORMAL LOW (ref 6.5–8.1)

## 2023-07-14 LAB — IRON AND IRON BINDING CAPACITY (CC-WL,HP ONLY)
Iron: 39 ug/dL (ref 28–170)
Saturation Ratios: 14 % (ref 10.4–31.8)
TIBC: 284 ug/dL (ref 250–450)
UIBC: 245 ug/dL (ref 148–442)

## 2023-07-14 LAB — FERRITIN: Ferritin: 247 ng/mL (ref 11–307)

## 2023-07-14 NOTE — Assessment & Plan Note (Addendum)
 Improving kidney function with creatinine at 1.10, indicating near-normal levels. Appetite has returned, contributing to better nutritional status. - Encourage continued hydration and balanced diet to maintain kidney function and nutritional status

## 2023-07-14 NOTE — Assessment & Plan Note (Signed)
-  She is on budesonide.  No recent flare-ups. Occasional diarrhea during flare-ups. -Continue current management and monitor for flare-ups.

## 2023-07-14 NOTE — Progress Notes (Signed)
 [facility]  HEMATOLOGY CLINIC PROGRESS NOTE  PATIENT NAME: Sharon Bailey   MR#: [id number] DOB: Apr 23, 1930  Patient Care Team: Leonel Cole, MD as PCP - General (Family Medicine) Lonni Slain, MD as PCP - Cardiology (Cardiology)  Date of visit: 07/14/2023   ASSESSMENT & PLAN:   Sharon Bailey is a 89 y.o. lady with past medical history of  collagenous colitis treated with budesonide , diverticulitis, GERD, gout, hyperlipidemia, hypertension, thyroid  disease, cardiac arrhythmia, chronic kidney disease, is being followed in our clinic for multifactorial anemia related to iron  deficiency and anemia of chronic disease from CKD.   Anemia - Multifactorial anemia from iron  deficiency, anemia of chronic disease related to CKD.  No signs of blood loss.   Hemoglobin is better at 9.5 today.  Plan is to transfuse if hemoglobin goes below 8.  - Patient is currently on oral iron  supplements with no side effects. Continue oral iron  supplementation daily.  -Iron  studies today show no evidence of iron  deficiency.  Ferritin pending.   - Await ferritin level results to determine need for IV iron .  Current picture does not indicate need for IV iron .  - Monitor hemoglobin levels closely  - Advise to report any increase in fatigue or dyspnea  RTC in 3 months for re-evaluation.   Chronic kidney disease, stage 3 unspecified (HCC) Improving kidney function with creatinine at 1.10, indicating near-normal levels. Appetite has returned, contributing to better nutritional status. - Encourage continued hydration and balanced diet to maintain kidney function and nutritional status  Collagenous colitis -She is on budesonide .  No recent flare-ups. Occasional diarrhea during flare-ups. -Continue current management and monitor for flare-ups.  Hx of Cellulitis No active cellulitis. Previous episodes required hospitalization and transfusion.   Femur fracture with surgical  repair Status post femur fracture with surgical repair and rod placement. Fracture has healed completely. She has been home for 3-4 months post-rehabilitation.  I spent a total of 20 minutes during this encounter with the patient including review of chart and various tests results, discussions about plan of care and coordination of care plan.  I reviewed lab results and outside records for this visit and discussed relevant results with the patient. Diagnosis, plan of care and treatment options were also discussed in detail with the patient. Opportunity provided to ask questions and answers provided to her apparent satisfaction. Provided instructions to call our clinic with any problems, questions or concerns prior to return visit. I recommended to continue follow-up with PCP and sub-specialists. She verbalized understanding and agreed with the plan. No barriers to learning was detected.  Chinita Patten, MD  07/14/2023 2:21 PM   [facility] - A DEPT OF [facility] [address] [place] [address] Dept: [phone] Dept Fax: [phone]   CHIEF COMPLAINT/ REASON FOR VISIT:  Follow-up for multifactorial anemia, related to iron  deficiency and anemia of chronic disease related to CKD.  INTERVAL HISTORY:  Discussed the use of AI scribe software for clinical note transcription with the patient, who gave verbal consent to proceed.  History of Present Illness She was accompanied by her daughter to clinic today.  She experienced a fall on January 31st at her retina doctor's office, resulting in a femur fracture. Surgery was performed the following day, with a rod placed in her femur and attached to her hip. The fracture has completely healed. Post-surgery, she was hospitalized for a week and then spent a  month in rehabilitation. She has been home for two months since March.  She has a history of anemia, requiring blood transfusions  during her hospital stay and again when she developed cellulitis. Her current hemoglobin level is 8.8, which is lower than previous levels but not low enough to require a transfusion. No signs of active bleeding, such as blood in stools or black stools. She is currently taking oral iron  supplements once a day.  Her kidney function has improved, with a creatinine level of 1.14, and she reports adequate hydration. She has regained her appetite, which had been poor for some time, leading to fluid retention and swelling. She manages swelling with a diuretic as needed and elevates her feet at home to reduce swelling.  No chest pain, difficulty breathing, or headaches. Her blood pressure tends to run high, but she has not taken her medication today. She is scheduled to see a wound doctor later today for evaluation of swelling and potential ulcer formation.   Sharon Bailey is a 89 year old female with anemia who presents for follow-up of her hemoglobin levels.  Her hemoglobin level has improved to 9.5 from 8.8 previously. She has been taking ferrous sulfate  once daily, which has improved her energy levels without causing sluggishness. Her iron  levels were normal in the last labs. No signs of active bleeding.  Her blood pressure fluctuates, sometimes running high or low, but it is generally stable at home. No immediate changes required.  She had a previous skin infection, cellulitis, which has mostly resolved except for a small area on her toe. There are no ulcers present.  She has healed from a previous fracture surgery and has been discharged from follow-up for this issue. She does not require any rehabilitation at this time.  She received IV iron  in 2024, which improved her symptoms at the time. She continues to take oral iron  supplements daily without any side effects. She denies any signs of blood loss such as bloody or black stools, nosebleeds, or gum bleeds.   SUMMARY OF HEMATOLOGIC  HISTORY:  December 20 2014:  Colonoscopy.  Severe diverticulosis in sigmoid colon   February 12, 2022: WBC 9.4 hemoglobin 7.3 MCV 79 platelet count 221; 82 segs 11 lymphs 4 monos 3 eos Ferritin 33   Chemistries notable for creatinine 1.18   February 21 2022:  [facility]  Colitis overall under good control with budesinolide.   Has been taking oral iron  for the past week.  No history of IV iron  nor required PRBC's in the past.  Has tolerated oral iron .  Has a regular diet.  No history of postpartum hemorrhage requiring transfusion.   No history of hemorrhage postoperatively requiring transfusion.  No hematochezia, melena, hemoptysis, hematuria.   No history of intra-articular or soft tissue bleeding.  Uses ibuprofen  about 2 to 3 times a week for pain.  Is not taking oral anticoagulants but does take ASA daily as an antiplatelet drugs.  No history of abnormal bleeding in family members.  Patient has symptoms of fatigue, pallor,  DOE, intolerance to cold, decreased performance status.  Patient has pica to ice but not starch/dirt.  Has never undergone an EGD.     March 23, 2022: WBC 13.2 hemoglobin 11.4 platelet count 208; 73 segs 13 lymphs 5 monos 7 eos 1 basophil reticulocyte count 2.5% Coombs test negative  Haptoglobin 136 SPEP with IEP showed no paraprotein.  Serum free kappa 53.1 lambda 27.9 with a kappa lambda 1.90 IgG  860 IgA 184 IgM 95 INR 1.1 PTT 26   Ferritin 437 folate 30.4 B12 203 zinc  23. CMP notable for potassium 3.4 creatinine 1.1 glucose 108   May 29 2022: Remains on oral iron  and vitamin B12 daily which she is tolerating well.  Occasional flare of colitis which can last a day.  She is followed by GI and was last seen there in January 2024.  Experiencing generalized itching helped briefly by lotions.     WBC 13.9 hemoglobin 12.7 MCV 92 platelet count 224.  ANC 11.5 CMP notable for creatinine 1.33 glucose 100 calcium  8.6 albumin  4.2   August 28 2022:   Scheduled follow up for anemia.  Feels well.  Taking oral iron  and B12 daily.  Colitis flares up occasionally.  No gout attacks.     WBC 10.3 hemoglobin 11.7 MCV 93 platelet count 171; 65 segs 21 lymphs 7 monos 5 eos 1 basophil  I have reviewed the past medical history, past surgical history, social history and family history with the patient and they are unchanged from previous note.  ALLERGIES: She is allergic to bactrim  [sulfamethoxazole -trimethoprim ] and cipro [ciprofloxacin hcl].  MEDICATIONS:  Current Outpatient Medications  Medication Sig Dispense Refill   allopurinol  (ZYLOPRIM ) 300 MG tablet Take 0.5 tablets (150 mg total) by mouth daily.     amLODipine  (NORVASC ) 5 MG tablet Take 5 mg by mouth daily.     Ascorbic Acid  (VITAMIN C) 1000 MG tablet Take 1,000 mg by mouth daily.     aspirin  EC 81 MG tablet Take 81 mg by mouth daily. Swallow whole.     budesonide  (ENTOCORT EC ) 3 MG 24 hr capsule Take 1 capsule (3 mg total) by mouth daily. 90 capsule 0   Cholecalciferol (VITAMIN D3) 1.25 MG (50000 UT) CAPS Take 1 capsule by mouth daily.     ferrous sulfate  325 (65 FE) MG EC tablet Take 325 mg by mouth daily with breakfast.     furosemide  (LASIX ) 20 MG tablet Take 20 mg by mouth daily.     levothyroxine  (SYNTHROID ) 100 MCG tablet Take 100 mcg by mouth every morning.     polyethylene glycol (MIRALAX  / GLYCOLAX ) 17 g packet Take 17 g by mouth daily as needed for moderate constipation.     SANTYL 250 UNIT/GM ointment Apply 1 Application topically every other day. With dressing changes at home to lower legs     simvastatin  (ZOCOR ) 40 MG tablet Take 40 mg by mouth daily.     traMADol  (ULTRAM ) 50 MG tablet Take 1 tablet (50 mg total) by mouth every 8 (eight) hours as needed for severe pain (pain score 7-10). 10 tablet 0   valsartan  (DIOVAN ) 160 MG tablet Take 160 mg by mouth daily.     vitamin B-12 1000 MCG tablet Take 1 tablet (1,000 mcg total) by mouth daily.     zinc  gluconate 50 MG tablet  Take 50 mg by mouth daily.     No current facility-administered medications for this visit.     REVIEW OF SYSTEMS:    Review of Systems - Oncology  All other pertinent systems were reviewed with the patient and are negative.  PHYSICAL EXAMINATION:   Onc Performance Status - 07/14/23 1158       ECOG Perf Status   ECOG Perf Status Ambulatory and capable of all selfcare but unable to carry out any work activities.  Up and about more than 50% of waking hours      KPS SCALE  KPS % SCORE Cares for self, unable to carry on normal activity or to do active work           Vitals:   07/14/23 1141  BP: (!) 193/80  Pulse: 80  Resp: 17  Temp: 98 F (36.7 C)  SpO2: 100%   Filed Weights   07/14/23 1141  Weight: 133 lb 8 oz (60.6 kg)    Physical Exam Constitutional:      General: She is not in acute distress.    Appearance: Normal appearance.  HENT:     Head: Normocephalic and atraumatic.  Eyes:     General: No scleral icterus.    Conjunctiva/sclera: Conjunctivae normal.  Cardiovascular:     Rate and Rhythm: Normal rate and regular rhythm.  Pulmonary:     Effort: Pulmonary effort is normal.  Abdominal:     General: There is no distension.  Musculoskeletal:     Comments: Both lower extremities are wrapped with Ace bandages.  Neurological:     General: No focal deficit present.     Mental Status: She is alert and oriented to person, place, and time.  Psychiatric:        Mood and Affect: Mood normal.        Behavior: Behavior normal.        Thought Content: Thought content normal.     LABORATORY DATA:   I have reviewed the data as listed.  Results for orders placed or performed in visit on 07/14/23  Iron  and Iron  Binding Capacity ([facility] only)  Result Value Ref Range   Iron  39 28 - 170 ug/dL   TIBC 715 749 - 549 ug/dL   Saturation Ratios 14 10.4 - 31.8 %   UIBC 245 148 - 442 ug/dL  CMP ([facility] only)  Result Value Ref Range   Sodium 144 135 - 145  mmol/L   Potassium 3.8 3.5 - 5.1 mmol/L   Chloride 110 98 - 111 mmol/L   CO2 27 22 - 32 mmol/L   Glucose, Bld 81 70 - 99 mg/dL   BUN 29 (H) 8 - 23 mg/dL   Creatinine 8.89 (H) 9.55 - 1.00 mg/dL   Calcium  9.4 8.9 - 10.3 mg/dL   Total Protein 6.4 (L) 6.5 - 8.1 g/dL   Albumin  3.5 3.5 - 5.0 g/dL   AST 14 (L) 15 - 41 U/L   ALT 7 0 - 44 U/L   Alkaline Phosphatase 76 38 - 126 U/L   Total Bilirubin 0.6 0.0 - 1.2 mg/dL   GFR, Estimated 48 (L) >60 mL/min   Anion gap 7 5 - 15  CBC with Differential (Cancer Center Only)  Result Value Ref Range   WBC Count 9.0 4.0 - 10.5 K/uL   RBC 3.54 (L) 3.87 - 5.11 MIL/uL   Hemoglobin 9.5 (L) 12.0 - 15.0 g/dL   HCT 69.2 (L) 63.9 - 53.9 %   MCV 86.7 80.0 - 100.0 fL   MCH 26.8 26.0 - 34.0 pg   MCHC 30.9 30.0 - 36.0 g/dL   RDW 83.1 (H) 88.4 - 84.4 %   Platelet Count 234 150 - 400 K/uL   nRBC 0.0 0.0 - 0.2 %   Neutrophils Relative % 60 %   Neutro Abs 5.4 1.7 - 7.7 K/uL   Lymphocytes Relative 25 %   Lymphs Abs 2.3 0.7 - 4.0 K/uL   Monocytes Relative 6 %   Monocytes Absolute 0.6 0.1 - 1.0 K/uL   Eosinophils Relative 8 %  Eosinophils Absolute 0.7 (H) 0.0 - 0.5 K/uL   Basophils Relative 1 %   Basophils Absolute 0.1 0.0 - 0.1 K/uL   Immature Granulocytes 0 %   Abs Immature Granulocytes 0.04 0.00 - 0.07 K/uL    RADIOGRAPHIC STUDIES:  No recent pertinent imaging studies available to review.  Orders Placed This Encounter  Procedures   CBC with Differential ([facility] Only)    Standing Status:   Future    Expected Date:   10/14/2023    Expiration Date:   01/12/2024   CMP (Cancer Center only)    Standing Status:   Future    Expected Date:   10/14/2023    Expiration Date:   01/12/2024   Iron  and Iron  Binding Capacity ([facility] only)    Standing Status:   Future    Expected Date:   10/14/2023    Expiration Date:   01/12/2024   Ferritin    Standing Status:   Future    Expected Date:   10/14/2023    Expiration Date:   01/12/2024     Future Appointments   Date Time Provider Department Center  07/16/2023 10:45 AM Marolyn Nest, MD [facility]  09/08/2023  3:00 PM Lonni Slain, MD [facility]  09/13/2023  4:00 PM Loreda Hacker, DPM [facility]  10/14/2023 11:00 AM [facility] None  10/14/2023 11:30 AM Tayley Mudrick, [place], MD [facility] None     This document was completed utilizing speech recognition software. Grammatical errors, random word insertions, pronoun errors, and incomplete sentences are an occasional consequence of this system due to software limitations, ambient noise, and hardware issues. Any formal questions or concerns about the content, text or information contained within the body of this dictation should be directly addressed to the provider for clarification.

## 2023-07-14 NOTE — Assessment & Plan Note (Addendum)
-   Multifactorial anemia from iron  deficiency, anemia of chronic disease related to CKD.  No signs of blood loss.   Hemoglobin is better at 9.5 today.  Plan is to transfuse if hemoglobin goes below 8.  - Patient is currently on oral iron  supplements with no side effects. Continue oral iron  supplementation daily.  -Iron  studies today show no evidence of iron  deficiency.  Ferritin pending.   - Await ferritin level results to determine need for IV iron .  Current picture does not indicate need for IV iron .  - Monitor hemoglobin levels closely  - Advise to report any increase in fatigue or dyspnea  RTC in 3 months for re-evaluation.

## 2023-07-16 ENCOUNTER — Encounter (HOSPITAL_BASED_OUTPATIENT_CLINIC_OR_DEPARTMENT_OTHER): Admitting: General Surgery

## 2023-07-16 DIAGNOSIS — I872 Venous insufficiency (chronic) (peripheral): Secondary | ICD-10-CM | POA: Diagnosis not present

## 2023-07-16 DIAGNOSIS — N183 Chronic kidney disease, stage 3 unspecified: Secondary | ICD-10-CM | POA: Diagnosis not present

## 2023-07-16 DIAGNOSIS — E43 Unspecified severe protein-calorie malnutrition: Secondary | ICD-10-CM | POA: Diagnosis not present

## 2023-07-16 DIAGNOSIS — I890 Lymphedema, not elsewhere classified: Secondary | ICD-10-CM | POA: Diagnosis not present

## 2023-07-16 DIAGNOSIS — L97523 Non-pressure chronic ulcer of other part of left foot with necrosis of muscle: Secondary | ICD-10-CM | POA: Diagnosis not present

## 2023-07-20 DIAGNOSIS — H353231 Exudative age-related macular degeneration, bilateral, with active choroidal neovascularization: Secondary | ICD-10-CM | POA: Diagnosis not present

## 2023-07-30 DIAGNOSIS — H906 Mixed conductive and sensorineural hearing loss, bilateral: Secondary | ICD-10-CM | POA: Diagnosis not present

## 2023-07-30 DIAGNOSIS — H903 Sensorineural hearing loss, bilateral: Secondary | ICD-10-CM | POA: Diagnosis not present

## 2023-07-30 DIAGNOSIS — H6123 Impacted cerumen, bilateral: Secondary | ICD-10-CM | POA: Diagnosis not present

## 2023-08-31 DIAGNOSIS — H353114 Nonexudative age-related macular degeneration, right eye, advanced atrophic with subfoveal involvement: Secondary | ICD-10-CM | POA: Diagnosis not present

## 2023-09-08 ENCOUNTER — Ambulatory Visit (HOSPITAL_BASED_OUTPATIENT_CLINIC_OR_DEPARTMENT_OTHER): Admitting: Cardiology

## 2023-09-08 ENCOUNTER — Encounter (HOSPITAL_BASED_OUTPATIENT_CLINIC_OR_DEPARTMENT_OTHER): Payer: Self-pay | Admitting: Cardiology

## 2023-09-08 VITALS — BP 132/70 | HR 78 | Resp 16 | Ht 63.0 in | Wt 128.0 lb

## 2023-09-08 DIAGNOSIS — R9431 Abnormal electrocardiogram [ECG] [EKG]: Secondary | ICD-10-CM | POA: Diagnosis not present

## 2023-09-08 DIAGNOSIS — I10 Essential (primary) hypertension: Secondary | ICD-10-CM

## 2023-09-08 DIAGNOSIS — Z8673 Personal history of transient ischemic attack (TIA), and cerebral infarction without residual deficits: Secondary | ICD-10-CM

## 2023-09-08 DIAGNOSIS — I872 Venous insufficiency (chronic) (peripheral): Secondary | ICD-10-CM

## 2023-09-08 DIAGNOSIS — R5382 Chronic fatigue, unspecified: Secondary | ICD-10-CM | POA: Diagnosis not present

## 2023-09-08 DIAGNOSIS — I350 Nonrheumatic aortic (valve) stenosis: Secondary | ICD-10-CM

## 2023-09-08 NOTE — Patient Instructions (Signed)
 Medication Instructions:   Your physician recommends that you continue on your current medications as directed. Please refer to the Current Medication list given to you today.  *If you need a refill on your cardiac medications before your next appointment, please call your pharmacy*    Follow-Up: At [facility], you and your health needs are our priority.  As part of our continuing mission to provide you with exceptional heart care, our providers are all part of one team.  This team includes your primary Cardiologist (physician) and Advanced Practice Providers or APPs (Physician Assistants and Nurse Practitioners) who all work together to provide you with the care you need, when you need it.  Your next appointment:   1 year(s)  Provider:   Shelda Bruckner, MD

## 2023-09-08 NOTE — Progress Notes (Signed)
 Cardiology Office Note:  .    Date:  09/08/2023  ID:  Romero Bailey Sharon, DOB 1930/03/22, MRN [id number] PCP: Leonel Cole, MD  [facility] Providers Cardiologist:  Shelda Bruckner, MD     History of Present Illness: .    Sharon Bailey is a 89 y.o. female with a hx of hypertension, hyperlipidemia, history of CVA and gout. I first met her 08/26/18. Of note, she usually gets anxious and has higher blood pressure readings in clinic.    Today: Here with her daughter today. There was mention of abnormal heart beats. I can see this was discussed with Dr. Leonel in May of this year but I cannot see notes/full report. She reports that she was not having any symptoms but was told she had a prior heart attack based on her ECG. We discussed her ECG today, how this affects interpretation, how the importance is if the ECG has changed. She had an echo in 03/2023 that was also reassuring, no wall motion abnormalities, normal EF, mild LVH. Aortic stenosis was moderate low flow/low gradient, DI 0.45, AVA 1.1.   No further falls since January. Walks with a walker, tries to move around all day. Fatigue is stable to improved.   Hasn't checked blood pressure regularly in a few months. Was wearing LE wraps for months for wound care which greatly improved her swelling. Hasn't had leg wraps in a few weeks but only minimal swelling. Hasn't needed to take lasix  in a long time.  Eating more protein/meat.   ROS: Denies chest pain, shortness of breath at rest or with normal exertion. No PND, orthopnea, worsening LE edema or unexpected weight gain. No syncope or palpitations. ROS otherwise negative except as noted.   Studies Reviewed: SABRA    EKG Interpretation Date/Time:  Wednesday September 08 2023 15:09:39 EDT Ventricular Rate:  78 PR Interval:  158 QRS Duration:  110 QT Interval:  398 QTC Calculation: 453 R Axis:   -51  Text Interpretation: Normal sinus rhythm Left axis deviation Left ventricular  hypertrophy Non-specific intra-ventricular conduction delay Cannot rule out Septal infarct , age undetermined Confirmed by Bruckner Shelda [phone]) on 09/08/2023 3:13:22 PM    Physical Exam:    VS:  BP 132/70   Pulse 78   Resp 16   Ht 5' 3 (1.6 m)   Wt 128 lb (58.1 kg)   SpO2 99%   BMI 22.67 kg/m    Wt Readings from Last 3 Encounters:  09/08/23 128 lb (58.1 kg)  07/14/23 133 lb 8 oz (60.6 kg)  07/06/23 132 lb (59.9 kg)    GEN: Well nourished, well developed in no acute distress HEENT: Normal, moist mucous membranes NECK: No JVD CARDIAC: regular rhythm, normal S1 and S2, no rubs or gallops. 2/6 systolic murmur. VASCULAR: Radial and DP pulses 2+ bilaterally. No carotid bruits RESPIRATORY:  Clear to auscultation without rales, wheezing or rhonchi  ABDOMEN: Soft, non-tender, non-distended MUSCULOSKELETAL:  Ambulates independently SKIN: Warm and dry, chronic skin changes and mild nonpitting edema NEUROLOGIC:  Alert and oriented x 3. No focal neuro deficits noted. PSYCHIATRIC:  Normal affect    ASSESSMENT AND PLAN: .    Abnormal ECG: they were concerned as they were told that this showed a prior heart attack. Reviewed that this is likely 2/2 her IVCD, also reviewed reassuring prior echo, no clinical evidence of MI  Hypertension:  -continue valsartan  160 mg daily -continue amlodipine  5 mg daily -had carvedilol  stopped in 2021, unclear why. Could  restart low dose if needed in the future -has said she does not want diuretic/anything that makes her urinate more. Has PRN lasix  but has not required in a long time.  Moderate aortic stenosis, low flow/low gradient -checked 03/2023, would check again in 2-3 years or sooner for symptoms  Chronic venous insufficiency -mild, stable -discussed compression, elevation -reviewed recent echo   Chronic fatigue: -feels she is coping well, may even be improved from prior -has had echo, monitor, and stress test (while having symptoms) that  have not shown cardiac etiology   History of CVA 06/2019 (seen on MRI) Cardiac risk counseling and primary prevention recommendations -see prior discussion re: statins. Has tolerated simvastatin , cannot raise dose due to amlodipine  interaction -counseled on red flag warning signs -Ideal LDL goal <70 -on aspirin  81 mg daily  Dispo: Follow-up in 1 year, or sooner as needed.  Signed, Shelda Bruckner, MD

## 2023-09-13 ENCOUNTER — Encounter: Payer: Self-pay | Admitting: Podiatry

## 2023-09-13 ENCOUNTER — Ambulatory Visit: Admitting: Podiatry

## 2023-09-13 DIAGNOSIS — M79675 Pain in left toe(s): Secondary | ICD-10-CM

## 2023-09-13 DIAGNOSIS — I739 Peripheral vascular disease, unspecified: Secondary | ICD-10-CM | POA: Diagnosis not present

## 2023-09-13 DIAGNOSIS — E213 Hyperparathyroidism, unspecified: Secondary | ICD-10-CM | POA: Insufficient documentation

## 2023-09-13 DIAGNOSIS — R195 Other fecal abnormalities: Secondary | ICD-10-CM | POA: Insufficient documentation

## 2023-09-13 DIAGNOSIS — B351 Tinea unguium: Secondary | ICD-10-CM

## 2023-09-13 DIAGNOSIS — M79674 Pain in right toe(s): Secondary | ICD-10-CM

## 2023-09-13 DIAGNOSIS — N183 Chronic kidney disease, stage 3 unspecified: Secondary | ICD-10-CM

## 2023-09-13 NOTE — Progress Notes (Signed)
 This patient returns to my office for at risk foot care.  This patient requires this care by a professional since this patient will be at risk due to having PAD and CKD.  This patient is unable to cut nails herself since the patient cannot reach her nails.These nails are painful walking and wearing shoes.  This patient presents for at risk foot care today. She is accompanied by her daughter.  General Appearance  Alert, conversant and in no acute stress.  Vascular  Dorsalis pedis and posterior tibial  pulses are weakly  palpable  bilaterally.  Capillary return is within normal limits  bilaterally. Temperature is within normal limits  bilaterally.  Neurologic  Senn-Weinstein monofilament wire test within normal limits  bilaterally. Muscle power within normal limits bilaterally.  Nails Thick disfigured discolored nails with subungual debris  from hallux to fifth toes bilaterally. No evidence of bacterial infection or drainage bilaterally.  Orthopedic  No limitations of motion  feet .  No crepitus or effusions noted.  No bony pathology or digital deformities noted.  Skin  normotropic skin with no porokeratosis noted bilaterally.  No signs of infections or ulcers noted.     Onychomycosis  Pain in right toes  Pain in left toes  Consent was obtained for treatment procedures.   Mechanical debridement of nails 1-5  bilaterally performed with a nail nipper.  Filed with dremel without incident.    Return office visit    4 months                  Told patient to return for periodic foot care and evaluation due to potential at risk complications.   Ruffin Cotton DPM

## 2023-10-14 ENCOUNTER — Inpatient Hospital Stay: Attending: Oncology

## 2023-10-14 ENCOUNTER — Encounter: Payer: Self-pay | Admitting: Oncology

## 2023-10-14 ENCOUNTER — Inpatient Hospital Stay: Admitting: Oncology

## 2023-10-14 VITALS — BP 168/64 | HR 71 | Temp 97.8°F | Resp 20 | Wt 130.6 lb

## 2023-10-14 DIAGNOSIS — D631 Anemia in chronic kidney disease: Secondary | ICD-10-CM | POA: Insufficient documentation

## 2023-10-14 DIAGNOSIS — D500 Iron deficiency anemia secondary to blood loss (chronic): Secondary | ICD-10-CM

## 2023-10-14 DIAGNOSIS — N183 Chronic kidney disease, stage 3 unspecified: Secondary | ICD-10-CM | POA: Diagnosis not present

## 2023-10-14 DIAGNOSIS — N1832 Chronic kidney disease, stage 3b: Secondary | ICD-10-CM

## 2023-10-14 DIAGNOSIS — I129 Hypertensive chronic kidney disease with stage 1 through stage 4 chronic kidney disease, or unspecified chronic kidney disease: Secondary | ICD-10-CM | POA: Insufficient documentation

## 2023-10-14 DIAGNOSIS — D509 Iron deficiency anemia, unspecified: Secondary | ICD-10-CM | POA: Diagnosis not present

## 2023-10-14 LAB — CBC WITH DIFFERENTIAL (CANCER CENTER ONLY)
Abs Immature Granulocytes: 0.03 K/uL (ref 0.00–0.07)
Basophils Absolute: 0.1 K/uL (ref 0.0–0.1)
Basophils Relative: 1 %
Eosinophils Absolute: 0.8 K/uL — ABNORMAL HIGH (ref 0.0–0.5)
Eosinophils Relative: 9 %
HCT: 28.4 % — ABNORMAL LOW (ref 36.0–46.0)
Hemoglobin: 9.2 g/dL — ABNORMAL LOW (ref 12.0–15.0)
Immature Granulocytes: 0 %
Lymphocytes Relative: 23 %
Lymphs Abs: 2.1 K/uL (ref 0.7–4.0)
MCH: 28.5 pg (ref 26.0–34.0)
MCHC: 32.4 g/dL (ref 30.0–36.0)
MCV: 87.9 fL (ref 80.0–100.0)
Monocytes Absolute: 0.6 K/uL (ref 0.1–1.0)
Monocytes Relative: 6 %
Neutro Abs: 5.8 K/uL (ref 1.7–7.7)
Neutrophils Relative %: 61 %
Platelet Count: 205 K/uL (ref 150–400)
RBC: 3.23 MIL/uL — ABNORMAL LOW (ref 3.87–5.11)
RDW: 17.8 % — ABNORMAL HIGH (ref 11.5–15.5)
WBC Count: 9.5 K/uL (ref 4.0–10.5)
nRBC: 0 % (ref 0.0–0.2)

## 2023-10-14 LAB — CMP (CANCER CENTER ONLY)
ALT: 16 U/L (ref 0–44)
AST: 21 U/L (ref 15–41)
Albumin: 3.8 g/dL (ref 3.5–5.0)
Alkaline Phosphatase: 69 U/L (ref 38–126)
Anion gap: 5 (ref 5–15)
BUN: 30 mg/dL — ABNORMAL HIGH (ref 8–23)
CO2: 27 mmol/L (ref 22–32)
Calcium: 9.6 mg/dL (ref 8.9–10.3)
Chloride: 110 mmol/L (ref 98–111)
Creatinine: 1.51 mg/dL — ABNORMAL HIGH (ref 0.44–1.00)
GFR, Estimated: 32 mL/min — ABNORMAL LOW (ref >60)
Glucose, Bld: 90 mg/dL (ref 70–99)
Potassium: 4.2 mmol/L (ref 3.5–5.1)
Sodium: 142 mmol/L (ref 135–145)
Total Bilirubin: 0.5 mg/dL (ref 0.0–1.2)
Total Protein: 6.8 g/dL (ref 6.5–8.1)

## 2023-10-14 LAB — IRON AND IRON BINDING CAPACITY (CC-WL,HP ONLY)
Iron: 35 ug/dL (ref 28–170)
Saturation Ratios: 13 % (ref 10.4–31.8)
TIBC: 273 ug/dL (ref 250–450)
UIBC: 238 ug/dL (ref 148–442)

## 2023-10-14 LAB — FERRITIN: Ferritin: 237 ng/mL (ref 11–307)

## 2023-10-14 NOTE — Assessment & Plan Note (Signed)
-   Multifactorial anemia from iron  deficiency, anemia of chronic disease related to CKD.  No signs of blood loss.   Anemia is well-managed with hemoglobin levels between 8.8 and 9.5, currently at 9.2. Iron  levels are normal, indicating effective management with current iron  supplementation. No signs of active bleeding or blood loss.  - Patient is currently on oral iron  supplements with no side effects. Continue oral iron  supplementation daily.  - Monitor hemoglobin levels closely  - Advise to report any increase in fatigue or dyspnea  RTC in 4 months for re-evaluation.

## 2023-10-14 NOTE — Assessment & Plan Note (Signed)
 Chronic kidney disease with fluctuating creatinine levels, currently at 1.5, possibly influenced by mild dehydration. No acute changes. - Encourage increased water intake to address potential dehydration - Monitor kidney function in four months

## 2023-10-14 NOTE — Progress Notes (Signed)
 [facility]  HEMATOLOGY CLINIC PROGRESS NOTE  PATIENT NAME: Sharon Bailey   MR#: [id number] DOB: August 16, 1930  Patient Care Team: Leonel Cole, MD as PCP - General (Family Medicine) Lonni Slain, MD as PCP - Cardiology (Cardiology)  Date of visit: 10/14/2023   ASSESSMENT & PLAN:   Sharon Bailey is a 89 y.o. lady with past medical history of  collagenous colitis treated with budesonide , diverticulitis, GERD, gout, hyperlipidemia, hypertension, thyroid  disease, cardiac arrhythmia, chronic kidney disease, is being followed in our clinic for multifactorial anemia related to iron  deficiency and anemia of chronic disease from CKD.   Anemia in chronic kidney disease - Multifactorial anemia from iron  deficiency, anemia of chronic disease related to CKD.  No signs of blood loss.   Anemia is well-managed with hemoglobin levels between 8.8 and 9.5, currently at 9.2. Iron  levels are normal, indicating effective management with current iron  supplementation. No signs of active bleeding or blood loss.  - Patient is currently on oral iron  supplements with no side effects. Continue oral iron  supplementation daily.  - Monitor hemoglobin levels closely  - Advise to report any increase in fatigue or dyspnea  RTC in 4 months for re-evaluation.   Chronic kidney disease, stage 3 unspecified (HCC) Chronic kidney disease with fluctuating creatinine levels, currently at 1.5, possibly influenced by mild dehydration. No acute changes. - Encourage increased water intake to address potential dehydration - Monitor kidney function in four months  Hx of Cellulitis No active cellulitis. Previous episodes required hospitalization and transfusion.   Femur fracture with surgical repair Status post femur fracture with surgical repair and rod placement. Fracture has healed completely. She has been home for 3-4 months post-rehabilitation.  I spent a total of 20 minutes during this  encounter with the patient including review of chart and various tests results, discussions about plan of care and coordination of care plan.  I reviewed lab results and outside records for this visit and discussed relevant results with the patient. Diagnosis, plan of care and treatment options were also discussed in detail with the patient. Opportunity provided to ask questions and answers provided to her apparent satisfaction. Provided instructions to call our clinic with any problems, questions or concerns prior to return visit. I recommended to continue follow-up with PCP and sub-specialists. She verbalized understanding and agreed with the plan. No barriers to learning was detected.  Chinita Patten, MD  10/14/2023 7:16 PM  [facility] - A DEPT OF [facility] [address] [place] [address] Dept: [phone] Dept Fax: [phone]   CHIEF COMPLAINT/ REASON FOR VISIT:  Follow-up for multifactorial anemia, related to iron  deficiency and anemia of chronic disease related to CKD.  INTERVAL HISTORY:  Discussed the use of AI scribe software for clinical note transcription with the patient, who gave verbal consent to proceed.  History of Present Illness Sharon Bailey is a 89 year old female who presents for follow-up after completing rehabilitation and wound care. She is accompanied by her mother, who is her primary caregiver.  She has completed her rehabilitation and wound care, and is doing well at home. There are no current issues with blood loss, chest pain, or breathing difficulties. The previous skin infection has resolved.  She is currently taking ferrous sulfate  once daily as part of her morning medication regimen. Recent lab results show normal iron  levels, and her hemoglobin is stable at 9.2, having previously been  8.8 and 9.5. There are no signs of active bleeding or blood loss.  Her creatinine level is slightly  elevated at 1.5. She admits to not drinking enough water, often opting for juice instead of full glasses of water.  She received IV iron  in 2024, which improved her symptoms at the time. She continues to take oral iron  supplements daily without any side effects. She denies any signs of blood loss such as bloody or black stools, nosebleeds, or gum bleeds.   SUMMARY OF HEMATOLOGIC HISTORY:  December 20 2014:  Colonoscopy.  Severe diverticulosis in sigmoid colon   February 12, 2022: WBC 9.4 hemoglobin 7.3 MCV 79 platelet count 221; 82 segs 11 lymphs 4 monos 3 eos Ferritin 33   Chemistries notable for creatinine 1.18   February 21 2022:  [facility]  Colitis overall under good control with budesinolide.   Has been taking oral iron  for the past week.  No history of IV iron  nor required PRBC's in the past.  Has tolerated oral iron .  Has a regular diet.  No history of postpartum hemorrhage requiring transfusion.   No history of hemorrhage postoperatively requiring transfusion.  No hematochezia, melena, hemoptysis, hematuria.   No history of intra-articular or soft tissue bleeding.  Uses ibuprofen  about 2 to 3 times a week for pain.  Is not taking oral anticoagulants but does take ASA daily as an antiplatelet drugs.  No history of abnormal bleeding in family members.  Patient has symptoms of fatigue, pallor,  DOE, intolerance to cold, decreased performance status.  Patient has pica to ice but not starch/dirt.  Has never undergone an EGD.     March 23, 2022: WBC 13.2 hemoglobin 11.4 platelet count 208; 73 segs 13 lymphs 5 monos 7 eos 1 basophil reticulocyte count 2.5% Coombs test negative  Haptoglobin 136 SPEP with IEP showed no paraprotein.  Serum free kappa 53.1 lambda 27.9 with a kappa lambda 1.90 IgG 860 IgA 184 IgM 95 INR 1.1 PTT 26   Ferritin 437 folate 30.4 B12 203 zinc  23. CMP notable for potassium 3.4 creatinine 1.1 glucose 108   May 29 2022: Remains on oral iron  and  vitamin B12 daily which she is tolerating well.  Occasional flare of colitis which can last a day.  She is followed by GI and was last seen there in January 2024.  Experiencing generalized itching helped briefly by lotions.     WBC 13.9 hemoglobin 12.7 MCV 92 platelet count 224.  ANC 11.5 CMP notable for creatinine 1.33 glucose 100 calcium  8.6 albumin  4.2   August 28 2022:  Scheduled follow up for anemia.  Feels well.  Taking oral iron  and B12 daily.  Colitis flares up occasionally.  No gout attacks.     WBC 10.3 hemoglobin 11.7 MCV 93 platelet count 171; 65 segs 21 lymphs 7 monos 5 eos 1 basophil  I have reviewed the past medical history, past surgical history, social history and family history with the patient and they are unchanged from previous note.  ALLERGIES: She is allergic to bactrim  [sulfamethoxazole -trimethoprim ] and cipro [ciprofloxacin hcl].  MEDICATIONS:  Current Outpatient Medications  Medication Sig Dispense Refill   allopurinol  (ZYLOPRIM ) 300 MG tablet Take 0.5 tablets (150 mg total) by mouth daily.     Ascorbic Acid  (VITAMIN C) 1000 MG tablet Take 1,000 mg by mouth daily.     aspirin  EC 81 MG tablet Take 81 mg by mouth daily. Swallow whole.     budesonide  (ENTOCORT EC ) 3  MG 24 hr capsule Take 1 capsule (3 mg total) by mouth daily. 90 capsule 0   calcium  carbonate (OSCAL) 1500 (600 Ca) MG TABS tablet Take 600 mg of elemental calcium  by mouth daily with breakfast.     cetirizine (ZYRTEC) 10 MG chewable tablet Chew 10 mg by mouth daily.     Cholecalciferol (VITAMIN D3) 1.25 MG (50000 UT) CAPS Take 1 capsule by mouth daily.     ferrous sulfate  325 (65 FE) MG EC tablet Take 325 mg by mouth daily with breakfast.     furosemide  (LASIX ) 20 MG tablet Take 20 mg by mouth as needed.     levothyroxine  (SYNTHROID ) 100 MCG tablet Take 100 mcg by mouth every morning.     polyethylene glycol (MIRALAX  / GLYCOLAX ) 17 g packet Take 17 g by mouth daily as needed for moderate constipation.      SANTYL 250 UNIT/GM ointment Apply 1 Application topically every other day. With dressing changes at home to lower legs     simvastatin  (ZOCOR ) 40 MG tablet Take 40 mg by mouth daily.     traMADol  (ULTRAM ) 50 MG tablet Take 1 tablet (50 mg total) by mouth every 8 (eight) hours as needed for severe pain (pain score 7-10). 10 tablet 0   valsartan  (DIOVAN ) 160 MG tablet Take 160 mg by mouth daily.     vitamin B-12 1000 MCG tablet Take 1 tablet (1,000 mcg total) by mouth daily.     zinc  gluconate 50 MG tablet Take 50 mg by mouth daily.     No current facility-administered medications for this visit.     REVIEW OF SYSTEMS:    Review of Systems - Oncology  All other pertinent systems were reviewed with the patient and are negative.  PHYSICAL EXAMINATION:   Onc Performance Status - 10/14/23 1137       ECOG Perf Status   ECOG Perf Status Ambulatory and capable of all selfcare but unable to carry out any work activities.  Up and about more than 50% of waking hours      KPS SCALE   KPS % SCORE Cares for self, unable to carry on normal activity or to do active work           Vitals:   10/14/23 1124 10/14/23 1130  BP: (!) 177/73 (!) 168/64  Pulse: 71   Resp: 20   Temp: 97.8 F (36.6 C)   SpO2: 100%    Filed Weights   10/14/23 1124  Weight: 130 lb 9.6 oz (59.2 kg)    Physical Exam Constitutional:      General: She is not in acute distress.    Appearance: Normal appearance.  HENT:     Head: Normocephalic and atraumatic.  Eyes:     General: No scleral icterus.    Conjunctiva/sclera: Conjunctivae normal.  Cardiovascular:     Rate and Rhythm: Normal rate and regular rhythm.  Pulmonary:     Effort: Pulmonary effort is normal.  Abdominal:     General: There is no distension.  Musculoskeletal:     Comments: Both lower extremities are wrapped with Ace bandages.  Neurological:     General: No focal deficit present.     Mental Status: She is alert and oriented to person,  place, and time.  Psychiatric:        Mood and Affect: Mood normal.        Behavior: Behavior normal.        Thought Content: Thought content  normal.     LABORATORY DATA:   I have reviewed the data as listed.  Results for orders placed or performed in visit on 10/14/23  CBC with Differential (Cancer Center Only)  Result Value Ref Range   WBC Count 9.5 4.0 - 10.5 K/uL   RBC 3.23 (L) 3.87 - 5.11 MIL/uL   Hemoglobin 9.2 (L) 12.0 - 15.0 g/dL   HCT 71.5 (L) 63.9 - 53.9 %   MCV 87.9 80.0 - 100.0 fL   MCH 28.5 26.0 - 34.0 pg   MCHC 32.4 30.0 - 36.0 g/dL   RDW 82.1 (H) 88.4 - 84.4 %   Platelet Count 205 150 - 400 K/uL   nRBC 0.0 0.0 - 0.2 %   Neutrophils Relative % 61 %   Neutro Abs 5.8 1.7 - 7.7 K/uL   Lymphocytes Relative 23 %   Lymphs Abs 2.1 0.7 - 4.0 K/uL   Monocytes Relative 6 %   Monocytes Absolute 0.6 0.1 - 1.0 K/uL   Eosinophils Relative 9 %   Eosinophils Absolute 0.8 (H) 0.0 - 0.5 K/uL   Basophils Relative 1 %   Basophils Absolute 0.1 0.0 - 0.1 K/uL   Immature Granulocytes 0 %   Abs Immature Granulocytes 0.03 0.00 - 0.07 K/uL  CMP (Cancer Center only)  Result Value Ref Range   Sodium 142 135 - 145 mmol/L   Potassium 4.2 3.5 - 5.1 mmol/L   Chloride 110 98 - 111 mmol/L   CO2 27 22 - 32 mmol/L   Glucose, Bld 90 70 - 99 mg/dL   BUN 30 (H) 8 - 23 mg/dL   Creatinine 8.48 (H) 9.55 - 1.00 mg/dL   Calcium  9.6 8.9 - 10.3 mg/dL   Total Protein 6.8 6.5 - 8.1 g/dL   Albumin  3.8 3.5 - 5.0 g/dL   AST 21 15 - 41 U/L   ALT 16 0 - 44 U/L   Alkaline Phosphatase 69 38 - 126 U/L   Total Bilirubin 0.5 0.0 - 1.2 mg/dL   GFR, Estimated 32 (L) >60 mL/min   Anion gap 5 5 - 15  Iron  and Iron  Binding Capacity ([facility] only)  Result Value Ref Range   Iron  35 28 - 170 ug/dL   TIBC 726 749 - 549 ug/dL   Saturation Ratios 13 10.4 - 31.8 %   UIBC 238 148 - 442 ug/dL  Ferritin  Result Value Ref Range   Ferritin 237 11 - 307 ng/mL    RADIOGRAPHIC STUDIES:  No recent pertinent  imaging studies available to review.  Orders Placed This Encounter  Procedures   CBC with Differential (Cancer Center Only)    Standing Status:   Future    Expected Date:   02/14/2024    Expiration Date:   05/14/2024   CMP (Cancer Center only)    Standing Status:   Future    Expected Date:   02/14/2024    Expiration Date:   05/14/2024   Iron  and Iron  Binding Capacity ([facility] only)    Standing Status:   Future    Expected Date:   02/14/2024    Expiration Date:   05/14/2024   Ferritin    Standing Status:   Future    Expected Date:   02/14/2024    Expiration Date:   05/14/2024     Future Appointments  Date Time Provider Department Center  01/13/2024  3:30 PM Loreda Hacker, DPM [facility]  02/17/2024 11:15 AM [facility] None  02/17/2024 11:45  AM Betzabe Bevans, [place], MD [facility] None     This document was completed utilizing speech recognition software. Grammatical errors, random word insertions, pronoun errors, and incomplete sentences are an occasional consequence of this system due to software limitations, ambient noise, and hardware issues. Any formal questions or concerns about the content, text or information contained within the body of this dictation should be directly addressed to the provider for clarification.

## 2023-10-15 DIAGNOSIS — H353221 Exudative age-related macular degeneration, left eye, with active choroidal neovascularization: Secondary | ICD-10-CM | POA: Diagnosis not present

## 2023-10-26 ENCOUNTER — Other Ambulatory Visit: Payer: Self-pay | Admitting: Nurse Practitioner

## 2023-10-27 DIAGNOSIS — H353114 Nonexudative age-related macular degeneration, right eye, advanced atrophic with subfoveal involvement: Secondary | ICD-10-CM | POA: Diagnosis not present

## 2023-12-10 DIAGNOSIS — S72142D Displaced intertrochanteric fracture of left femur, subsequent encounter for closed fracture with routine healing: Secondary | ICD-10-CM | POA: Diagnosis not present

## 2024-01-13 ENCOUNTER — Ambulatory Visit: Admitting: Podiatry

## 2024-01-23 ENCOUNTER — Other Ambulatory Visit: Payer: Self-pay | Admitting: Nurse Practitioner

## 2024-02-03 ENCOUNTER — Ambulatory Visit: Admitting: Podiatry

## 2024-02-17 ENCOUNTER — Ambulatory Visit: Admitting: Oncology

## 2024-02-17 ENCOUNTER — Other Ambulatory Visit

## 2024-03-02 ENCOUNTER — Ambulatory Visit: Admitting: Podiatry
